# Patient Record
Sex: Female | Born: 1945 | Race: White | Hispanic: No | Smoking: Never smoker
Health system: Southern US, Community
[De-identification: ages and names within clinical notes are randomized; demographics above are authoritative.]

## PROBLEM LIST (undated history)

## (undated) DIAGNOSIS — K635 Polyp of colon: Secondary | ICD-10-CM

## (undated) DIAGNOSIS — M797 Fibromyalgia: Secondary | ICD-10-CM

## (undated) DIAGNOSIS — G4700 Insomnia, unspecified: Secondary | ICD-10-CM

## (undated) DIAGNOSIS — I499 Cardiac arrhythmia, unspecified: Secondary | ICD-10-CM

## (undated) DIAGNOSIS — K5730 Diverticulosis of large intestine without perforation or abscess without bleeding: Secondary | ICD-10-CM

## (undated) DIAGNOSIS — K219 Gastro-esophageal reflux disease without esophagitis: Secondary | ICD-10-CM

## (undated) DIAGNOSIS — M810 Age-related osteoporosis without current pathological fracture: Secondary | ICD-10-CM

## (undated) DIAGNOSIS — H269 Unspecified cataract: Secondary | ICD-10-CM

## (undated) DIAGNOSIS — I10 Essential (primary) hypertension: Secondary | ICD-10-CM

## (undated) DIAGNOSIS — I5189 Other ill-defined heart diseases: Secondary | ICD-10-CM

## (undated) DIAGNOSIS — F419 Anxiety disorder, unspecified: Secondary | ICD-10-CM

## (undated) DIAGNOSIS — K819 Cholecystitis, unspecified: Secondary | ICD-10-CM

## (undated) DIAGNOSIS — E538 Deficiency of other specified B group vitamins: Secondary | ICD-10-CM

## (undated) DIAGNOSIS — R112 Nausea with vomiting, unspecified: Secondary | ICD-10-CM

## (undated) DIAGNOSIS — J329 Chronic sinusitis, unspecified: Secondary | ICD-10-CM

## (undated) DIAGNOSIS — M1990 Unspecified osteoarthritis, unspecified site: Secondary | ICD-10-CM

## (undated) DIAGNOSIS — F3289 Other specified depressive episodes: Secondary | ICD-10-CM

## (undated) DIAGNOSIS — Z9889 Other specified postprocedural states: Secondary | ICD-10-CM

## (undated) DIAGNOSIS — F329 Major depressive disorder, single episode, unspecified: Secondary | ICD-10-CM

## (undated) DIAGNOSIS — C4490 Unspecified malignant neoplasm of skin, unspecified: Secondary | ICD-10-CM

## (undated) DIAGNOSIS — E785 Hyperlipidemia, unspecified: Secondary | ICD-10-CM

## (undated) DIAGNOSIS — E559 Vitamin D deficiency, unspecified: Secondary | ICD-10-CM

## (undated) DIAGNOSIS — R55 Syncope and collapse: Secondary | ICD-10-CM

## (undated) DIAGNOSIS — D649 Anemia, unspecified: Secondary | ICD-10-CM

## (undated) DIAGNOSIS — Z5189 Encounter for other specified aftercare: Secondary | ICD-10-CM

## (undated) HISTORY — PX: MIDDLE EAR SURGERY: SHX713

## (undated) HISTORY — DX: Cholecystitis, unspecified: K81.9

## (undated) HISTORY — DX: Other specified depressive episodes: F32.89

## (undated) HISTORY — DX: Anemia, unspecified: D64.9

## (undated) HISTORY — DX: Syncope and collapse: R55

## (undated) HISTORY — DX: Unspecified cataract: H26.9

## (undated) HISTORY — DX: Encounter for other specified aftercare: Z51.89

## (undated) HISTORY — DX: Fibromyalgia: M79.7

## (undated) HISTORY — PX: TONSILLECTOMY: SUR1361

## (undated) HISTORY — DX: Gastro-esophageal reflux disease without esophagitis: K21.9

## (undated) HISTORY — DX: Diverticulosis of large intestine without perforation or abscess without bleeding: K57.30

## (undated) HISTORY — DX: Morbid (severe) obesity due to excess calories: E66.01

## (undated) HISTORY — DX: Major depressive disorder, single episode, unspecified: F32.9

## (undated) HISTORY — DX: Unspecified malignant neoplasm of skin, unspecified: C44.90

## (undated) HISTORY — DX: Other ill-defined heart diseases: I51.89

## (undated) HISTORY — PX: COLONOSCOPY: SHX174

## (undated) HISTORY — DX: Polyp of colon: K63.5

## (undated) HISTORY — PX: OTHER SURGICAL HISTORY: SHX169

## (undated) HISTORY — DX: Essential (primary) hypertension: I10

## (undated) HISTORY — DX: Vitamin D deficiency, unspecified: E55.9

## (undated) HISTORY — DX: Anxiety disorder, unspecified: F41.9

## (undated) HISTORY — DX: Age-related osteoporosis without current pathological fracture: M81.0

## (undated) HISTORY — DX: Unspecified osteoarthritis, unspecified site: M19.90

## (undated) HISTORY — DX: Insomnia, unspecified: G47.00

## (undated) HISTORY — DX: Hyperlipidemia, unspecified: E78.5

## (undated) HISTORY — PX: APPENDECTOMY: SHX54

## (undated) HISTORY — PX: GASTROPLASTY: SHX192

## (undated) HISTORY — DX: Deficiency of other specified B group vitamins: E53.8

## (undated) HISTORY — PX: CHOLECYSTECTOMY: SHX55

## (undated) HISTORY — PX: TOTAL KNEE ARTHROPLASTY: SHX125

## (undated) HISTORY — PX: ABDOMINAL HYSTERECTOMY: SHX81

## (undated) HISTORY — PX: TUBAL LIGATION: SHX77

---

## 1983-06-19 HISTORY — PX: BREAST EXCISIONAL BIOPSY: SUR124

## 1997-09-27 ENCOUNTER — Other Ambulatory Visit: Admission: RE | Admit: 1997-09-27 | Discharge: 1997-09-27 | Payer: Self-pay | Admitting: Gynecology

## 1997-10-08 ENCOUNTER — Other Ambulatory Visit: Admission: RE | Admit: 1997-10-08 | Discharge: 1997-10-08 | Payer: Self-pay | Admitting: Gynecology

## 1998-07-05 ENCOUNTER — Encounter: Payer: Self-pay | Admitting: Internal Medicine

## 1998-07-05 ENCOUNTER — Ambulatory Visit (HOSPITAL_COMMUNITY): Admission: RE | Admit: 1998-07-05 | Discharge: 1998-07-05 | Payer: Self-pay | Admitting: Internal Medicine

## 1999-02-13 ENCOUNTER — Other Ambulatory Visit: Admission: RE | Admit: 1999-02-13 | Discharge: 1999-02-13 | Payer: Self-pay | Admitting: Internal Medicine

## 1999-12-07 ENCOUNTER — Other Ambulatory Visit: Admission: RE | Admit: 1999-12-07 | Discharge: 1999-12-07 | Payer: Self-pay | Admitting: Gynecology

## 2000-01-08 ENCOUNTER — Ambulatory Visit (HOSPITAL_COMMUNITY): Admission: RE | Admit: 2000-01-08 | Discharge: 2000-01-08 | Payer: Self-pay | Admitting: Internal Medicine

## 2000-01-08 ENCOUNTER — Encounter: Payer: Self-pay | Admitting: Internal Medicine

## 2000-02-08 ENCOUNTER — Encounter: Payer: Self-pay | Admitting: Gynecology

## 2000-02-08 ENCOUNTER — Encounter: Admission: RE | Admit: 2000-02-08 | Discharge: 2000-02-08 | Payer: Self-pay | Admitting: Gynecology

## 2000-02-12 ENCOUNTER — Observation Stay (HOSPITAL_COMMUNITY): Admission: EM | Admit: 2000-02-12 | Discharge: 2000-02-13 | Payer: Self-pay | Admitting: Internal Medicine

## 2000-02-12 ENCOUNTER — Encounter: Payer: Self-pay | Admitting: Internal Medicine

## 2000-02-13 ENCOUNTER — Encounter: Payer: Self-pay | Admitting: Internal Medicine

## 2000-02-14 ENCOUNTER — Encounter: Admission: RE | Admit: 2000-02-14 | Discharge: 2000-02-14 | Payer: Self-pay | Admitting: Gynecology

## 2000-02-14 ENCOUNTER — Encounter: Payer: Self-pay | Admitting: Gynecology

## 2000-03-28 ENCOUNTER — Ambulatory Visit (HOSPITAL_COMMUNITY): Admission: RE | Admit: 2000-03-28 | Discharge: 2000-03-28 | Payer: Self-pay | Admitting: Internal Medicine

## 2000-04-25 ENCOUNTER — Encounter: Payer: Self-pay | Admitting: Surgery

## 2000-04-25 ENCOUNTER — Inpatient Hospital Stay (HOSPITAL_COMMUNITY): Admission: RE | Admit: 2000-04-25 | Discharge: 2000-04-29 | Payer: Self-pay | Admitting: Surgery

## 2000-07-09 ENCOUNTER — Ambulatory Visit (HOSPITAL_COMMUNITY): Admission: RE | Admit: 2000-07-09 | Discharge: 2000-07-09 | Payer: Self-pay | Admitting: Surgery

## 2000-07-09 ENCOUNTER — Encounter: Payer: Self-pay | Admitting: Surgery

## 2000-12-09 ENCOUNTER — Other Ambulatory Visit: Admission: RE | Admit: 2000-12-09 | Discharge: 2000-12-09 | Payer: Self-pay | Admitting: Gynecology

## 2001-02-14 ENCOUNTER — Encounter: Payer: Self-pay | Admitting: Gynecology

## 2001-02-14 ENCOUNTER — Encounter: Admission: RE | Admit: 2001-02-14 | Discharge: 2001-02-14 | Payer: Self-pay | Admitting: Gynecology

## 2001-11-21 ENCOUNTER — Ambulatory Visit (HOSPITAL_COMMUNITY): Admission: RE | Admit: 2001-11-21 | Discharge: 2001-11-21 | Payer: Self-pay | Admitting: Internal Medicine

## 2001-11-21 ENCOUNTER — Encounter: Payer: Self-pay | Admitting: Internal Medicine

## 2001-12-11 ENCOUNTER — Other Ambulatory Visit: Admission: RE | Admit: 2001-12-11 | Discharge: 2001-12-11 | Payer: Self-pay | Admitting: Gynecology

## 2002-02-17 ENCOUNTER — Encounter: Payer: Self-pay | Admitting: Gynecology

## 2002-02-17 ENCOUNTER — Encounter: Admission: RE | Admit: 2002-02-17 | Discharge: 2002-02-17 | Payer: Self-pay | Admitting: Gynecology

## 2002-12-14 ENCOUNTER — Other Ambulatory Visit: Admission: RE | Admit: 2002-12-14 | Discharge: 2002-12-14 | Payer: Self-pay | Admitting: Gynecology

## 2003-03-02 ENCOUNTER — Encounter: Admission: RE | Admit: 2003-03-02 | Discharge: 2003-03-02 | Payer: Self-pay | Admitting: Gynecology

## 2003-03-02 ENCOUNTER — Encounter: Payer: Self-pay | Admitting: Gynecology

## 2003-03-08 ENCOUNTER — Encounter: Admission: RE | Admit: 2003-03-08 | Discharge: 2003-03-08 | Payer: Self-pay | Admitting: Gynecology

## 2003-03-08 ENCOUNTER — Encounter: Payer: Self-pay | Admitting: Gynecology

## 2003-12-16 ENCOUNTER — Other Ambulatory Visit: Admission: RE | Admit: 2003-12-16 | Discharge: 2003-12-16 | Payer: Self-pay | Admitting: Gynecology

## 2004-03-09 ENCOUNTER — Encounter: Admission: RE | Admit: 2004-03-09 | Discharge: 2004-03-09 | Payer: Self-pay | Admitting: Gynecology

## 2004-05-17 ENCOUNTER — Ambulatory Visit: Payer: Self-pay | Admitting: Internal Medicine

## 2004-08-16 ENCOUNTER — Ambulatory Visit: Payer: Self-pay | Admitting: Internal Medicine

## 2004-12-26 ENCOUNTER — Other Ambulatory Visit: Admission: RE | Admit: 2004-12-26 | Discharge: 2004-12-26 | Payer: Self-pay | Admitting: Gynecology

## 2005-03-30 ENCOUNTER — Encounter: Admission: RE | Admit: 2005-03-30 | Discharge: 2005-03-30 | Payer: Self-pay | Admitting: Gynecology

## 2005-04-03 ENCOUNTER — Ambulatory Visit: Payer: Self-pay | Admitting: Internal Medicine

## 2005-04-18 ENCOUNTER — Ambulatory Visit: Payer: Self-pay | Admitting: Internal Medicine

## 2006-01-07 ENCOUNTER — Other Ambulatory Visit: Admission: RE | Admit: 2006-01-07 | Discharge: 2006-01-07 | Payer: Self-pay | Admitting: Gynecology

## 2006-01-22 ENCOUNTER — Ambulatory Visit: Payer: Self-pay | Admitting: Internal Medicine

## 2006-01-31 ENCOUNTER — Ambulatory Visit: Payer: Self-pay

## 2006-02-04 ENCOUNTER — Inpatient Hospital Stay (HOSPITAL_COMMUNITY): Admission: RE | Admit: 2006-02-04 | Discharge: 2006-02-07 | Payer: Self-pay | Admitting: Orthopedic Surgery

## 2006-02-11 ENCOUNTER — Ambulatory Visit: Payer: Self-pay | Admitting: Internal Medicine

## 2006-02-12 ENCOUNTER — Encounter: Payer: Self-pay | Admitting: Vascular Surgery

## 2006-02-12 ENCOUNTER — Ambulatory Visit (HOSPITAL_COMMUNITY): Admission: RE | Admit: 2006-02-12 | Discharge: 2006-02-12 | Payer: Self-pay | Admitting: Internal Medicine

## 2006-02-12 ENCOUNTER — Ambulatory Visit: Payer: Self-pay | Admitting: Internal Medicine

## 2006-04-03 ENCOUNTER — Encounter: Admission: RE | Admit: 2006-04-03 | Discharge: 2006-04-03 | Payer: Self-pay | Admitting: Gynecology

## 2006-04-03 ENCOUNTER — Ambulatory Visit: Payer: Self-pay | Admitting: Internal Medicine

## 2006-07-01 ENCOUNTER — Ambulatory Visit: Payer: Self-pay | Admitting: Internal Medicine

## 2006-07-24 ENCOUNTER — Encounter (INDEPENDENT_AMBULATORY_CARE_PROVIDER_SITE_OTHER): Payer: Self-pay | Admitting: *Deleted

## 2006-07-24 ENCOUNTER — Ambulatory Visit: Payer: Self-pay | Admitting: Internal Medicine

## 2006-07-24 LAB — HM COLONOSCOPY

## 2006-08-20 ENCOUNTER — Ambulatory Visit: Payer: Self-pay | Admitting: Internal Medicine

## 2006-08-21 ENCOUNTER — Ambulatory Visit: Payer: Self-pay | Admitting: *Deleted

## 2006-09-16 ENCOUNTER — Ambulatory Visit: Payer: Self-pay | Admitting: Internal Medicine

## 2006-10-07 ENCOUNTER — Ambulatory Visit: Payer: Self-pay | Admitting: Cardiology

## 2007-01-16 ENCOUNTER — Other Ambulatory Visit: Admission: RE | Admit: 2007-01-16 | Discharge: 2007-01-16 | Payer: Self-pay | Admitting: Gynecology

## 2007-01-20 ENCOUNTER — Ambulatory Visit: Payer: Self-pay | Admitting: Internal Medicine

## 2007-01-20 LAB — CONVERTED CEMR LAB
ALT: 12 units/L (ref 0–35)
AST: 12 units/L (ref 0–37)
Alkaline Phosphatase: 84 units/L (ref 39–117)
BUN: 12 mg/dL (ref 6–23)
Basophils Relative: 0.6 % (ref 0.0–1.0)
Bilirubin Urine: NEGATIVE
Bilirubin, Direct: 0.1 mg/dL (ref 0.0–0.3)
Calcium: 9 mg/dL (ref 8.4–10.5)
Chloride: 105 meq/L (ref 96–112)
Eosinophils Absolute: 0.2 10*3/uL (ref 0.0–0.6)
Eosinophils Relative: 2.6 % (ref 0.0–5.0)
GFR calc Af Amer: 109 mL/min
GFR calc non Af Amer: 90 mL/min
Glucose, Bld: 94 mg/dL (ref 70–99)
HDL: 40.6 mg/dL (ref >39.0)
Ketones, ur: NEGATIVE mg/dL
MCV: 87.8 fL (ref 78.0–100.0)
Monocytes Relative: 5.3 % (ref 3.0–11.0)
Neutro Abs: 3.6 10*3/uL (ref 1.4–7.7)
Nitrite: NEGATIVE
Platelets: 264 10*3/uL (ref 150–400)
RBC: 4.4 M/uL (ref 3.87–5.11)
Specific Gravity, Urine: 1.01 (ref 1.000–1.03)
TSH: 1.03 microintl units/mL (ref 0.35–5.50)
Total CHOL/HDL Ratio: 5.8
Total Protein, Urine: NEGATIVE mg/dL
Triglycerides: 182 mg/dL — ABNORMAL HIGH (ref 0–149)
Urine Glucose: NEGATIVE mg/dL
WBC: 6.6 10*3/uL (ref 4.5–10.5)

## 2007-03-17 ENCOUNTER — Ambulatory Visit: Payer: Self-pay | Admitting: Internal Medicine

## 2007-03-17 ENCOUNTER — Inpatient Hospital Stay (HOSPITAL_COMMUNITY): Admission: RE | Admit: 2007-03-17 | Discharge: 2007-03-22 | Payer: Self-pay | Admitting: Orthopedic Surgery

## 2007-03-19 ENCOUNTER — Ambulatory Visit: Payer: Self-pay | Admitting: Vascular Surgery

## 2007-04-01 ENCOUNTER — Ambulatory Visit: Payer: Self-pay | Admitting: Internal Medicine

## 2007-04-01 LAB — CONVERTED CEMR LAB
BUN: 8 mg/dL (ref 6–23)
Basophils Relative: 0.2 % (ref 0.0–1.0)
CO2: 31 meq/L (ref 19–32)
Creatinine, Ser: 0.6 mg/dL (ref 0.4–1.2)
Eosinophils Relative: 2.5 % (ref 0.0–5.0)
Glucose, Bld: 104 mg/dL — ABNORMAL HIGH (ref 70–99)
HCT: 40.2 % (ref 36.0–46.0)
Hemoglobin: 13.6 g/dL (ref 12.0–15.0)
Leukocytes, UA: NEGATIVE
Lymphocytes Relative: 20.8 % (ref 12.0–46.0)
Monocytes Absolute: 0.5 10*3/uL (ref 0.2–0.7)
Monocytes Relative: 4.6 % (ref 3.0–11.0)
Neutro Abs: 7.6 10*3/uL (ref 1.4–7.7)
Nitrite: NEGATIVE
Potassium: 3.9 meq/L (ref 3.5–5.1)
RDW: 14.7 % — ABNORMAL HIGH (ref 11.5–14.6)
Specific Gravity, Urine: 1.02 (ref 1.000–1.03)
Total Protein, Urine: NEGATIVE mg/dL
Urobilinogen, UA: 0.2 (ref 0.0–1.0)
WBC: 10.6 10*3/uL — ABNORMAL HIGH (ref 4.5–10.5)
pH: 5.5 (ref 5.0–8.0)

## 2007-04-04 ENCOUNTER — Encounter: Payer: Self-pay | Admitting: Internal Medicine

## 2007-04-04 DIAGNOSIS — K219 Gastro-esophageal reflux disease without esophagitis: Secondary | ICD-10-CM

## 2007-04-04 DIAGNOSIS — K5730 Diverticulosis of large intestine without perforation or abscess without bleeding: Secondary | ICD-10-CM | POA: Insufficient documentation

## 2007-04-07 ENCOUNTER — Ambulatory Visit: Payer: Self-pay | Admitting: Internal Medicine

## 2007-04-07 ENCOUNTER — Encounter: Admission: RE | Admit: 2007-04-07 | Discharge: 2007-04-07 | Payer: Self-pay | Admitting: Gynecology

## 2007-04-07 DIAGNOSIS — D500 Iron deficiency anemia secondary to blood loss (chronic): Secondary | ICD-10-CM

## 2007-04-07 DIAGNOSIS — M1990 Unspecified osteoarthritis, unspecified site: Secondary | ICD-10-CM | POA: Insufficient documentation

## 2007-04-07 DIAGNOSIS — R0989 Other specified symptoms and signs involving the circulatory and respiratory systems: Secondary | ICD-10-CM

## 2007-04-07 DIAGNOSIS — R0609 Other forms of dyspnea: Secondary | ICD-10-CM

## 2007-04-07 DIAGNOSIS — E538 Deficiency of other specified B group vitamins: Secondary | ICD-10-CM

## 2007-04-08 ENCOUNTER — Encounter: Payer: Self-pay | Admitting: Internal Medicine

## 2007-04-28 ENCOUNTER — Telehealth (INDEPENDENT_AMBULATORY_CARE_PROVIDER_SITE_OTHER): Payer: Self-pay | Admitting: *Deleted

## 2007-07-08 ENCOUNTER — Ambulatory Visit: Payer: Self-pay | Admitting: Internal Medicine

## 2007-07-08 DIAGNOSIS — G4700 Insomnia, unspecified: Secondary | ICD-10-CM

## 2007-07-08 DIAGNOSIS — F4323 Adjustment disorder with mixed anxiety and depressed mood: Secondary | ICD-10-CM

## 2007-07-08 DIAGNOSIS — J310 Chronic rhinitis: Secondary | ICD-10-CM

## 2007-07-18 ENCOUNTER — Encounter: Payer: Self-pay | Admitting: Internal Medicine

## 2007-09-23 ENCOUNTER — Ambulatory Visit: Payer: Self-pay | Admitting: Internal Medicine

## 2007-10-07 ENCOUNTER — Ambulatory Visit: Payer: Self-pay | Admitting: Internal Medicine

## 2007-10-07 DIAGNOSIS — R300 Dysuria: Secondary | ICD-10-CM | POA: Insufficient documentation

## 2007-10-07 DIAGNOSIS — F411 Generalized anxiety disorder: Secondary | ICD-10-CM

## 2007-10-08 LAB — CONVERTED CEMR LAB
Bilirubin Urine: NEGATIVE
Nitrite: NEGATIVE
Total Protein, Urine: NEGATIVE mg/dL
Urine Glucose: NEGATIVE mg/dL
pH: 5.5 (ref 5.0–8.0)

## 2008-02-02 LAB — HM DIABETES EYE EXAM

## 2008-02-20 ENCOUNTER — Encounter: Payer: Self-pay | Admitting: Internal Medicine

## 2008-03-02 ENCOUNTER — Encounter: Payer: Self-pay | Admitting: Internal Medicine

## 2008-03-23 ENCOUNTER — Ambulatory Visit: Payer: Self-pay | Admitting: Internal Medicine

## 2008-03-29 ENCOUNTER — Encounter: Payer: Self-pay | Admitting: Internal Medicine

## 2008-04-08 ENCOUNTER — Encounter: Admission: RE | Admit: 2008-04-08 | Discharge: 2008-04-08 | Payer: Self-pay | Admitting: Gynecology

## 2008-04-09 ENCOUNTER — Ambulatory Visit: Payer: Self-pay | Admitting: Internal Medicine

## 2008-04-09 DIAGNOSIS — J018 Other acute sinusitis: Secondary | ICD-10-CM

## 2008-04-09 DIAGNOSIS — R05 Cough: Secondary | ICD-10-CM | POA: Insufficient documentation

## 2008-04-09 DIAGNOSIS — H6990 Unspecified Eustachian tube disorder, unspecified ear: Secondary | ICD-10-CM | POA: Insufficient documentation

## 2008-05-19 ENCOUNTER — Encounter: Payer: Self-pay | Admitting: Internal Medicine

## 2008-06-18 HISTORY — PX: BREAST BIOPSY: SHX20

## 2008-09-27 ENCOUNTER — Ambulatory Visit: Payer: Self-pay | Admitting: Internal Medicine

## 2008-09-27 DIAGNOSIS — R55 Syncope and collapse: Secondary | ICD-10-CM

## 2008-09-28 ENCOUNTER — Encounter (INDEPENDENT_AMBULATORY_CARE_PROVIDER_SITE_OTHER): Payer: Self-pay | Admitting: *Deleted

## 2008-09-28 ENCOUNTER — Ambulatory Visit: Payer: Self-pay | Admitting: Internal Medicine

## 2008-10-09 DIAGNOSIS — E559 Vitamin D deficiency, unspecified: Secondary | ICD-10-CM

## 2008-10-09 DIAGNOSIS — I10 Essential (primary) hypertension: Secondary | ICD-10-CM | POA: Insufficient documentation

## 2008-10-09 DIAGNOSIS — E785 Hyperlipidemia, unspecified: Secondary | ICD-10-CM | POA: Insufficient documentation

## 2008-10-18 ENCOUNTER — Encounter: Payer: Self-pay | Admitting: Internal Medicine

## 2008-10-18 ENCOUNTER — Ambulatory Visit: Payer: Self-pay

## 2008-10-18 ENCOUNTER — Ambulatory Visit: Payer: Self-pay | Admitting: Cardiology

## 2008-10-18 DIAGNOSIS — R002 Palpitations: Secondary | ICD-10-CM

## 2008-10-18 DIAGNOSIS — R079 Chest pain, unspecified: Secondary | ICD-10-CM | POA: Insufficient documentation

## 2008-10-25 ENCOUNTER — Telehealth: Payer: Self-pay | Admitting: Internal Medicine

## 2008-11-03 ENCOUNTER — Ambulatory Visit: Payer: Self-pay

## 2008-11-03 ENCOUNTER — Ambulatory Visit: Payer: Self-pay | Admitting: Cardiology

## 2008-11-03 ENCOUNTER — Encounter: Payer: Self-pay | Admitting: Cardiology

## 2008-11-19 ENCOUNTER — Ambulatory Visit: Payer: Self-pay | Admitting: Cardiology

## 2008-11-25 ENCOUNTER — Emergency Department (HOSPITAL_COMMUNITY): Admission: EM | Admit: 2008-11-25 | Discharge: 2008-11-25 | Payer: Self-pay | Admitting: Emergency Medicine

## 2008-12-09 ENCOUNTER — Telehealth (INDEPENDENT_AMBULATORY_CARE_PROVIDER_SITE_OTHER): Payer: Self-pay | Admitting: *Deleted

## 2009-01-12 ENCOUNTER — Telehealth: Payer: Self-pay | Admitting: Internal Medicine

## 2009-03-02 ENCOUNTER — Ambulatory Visit: Payer: Self-pay | Admitting: Internal Medicine

## 2009-03-03 ENCOUNTER — Encounter: Payer: Self-pay | Admitting: Internal Medicine

## 2009-04-18 ENCOUNTER — Encounter: Admission: RE | Admit: 2009-04-18 | Discharge: 2009-04-18 | Payer: Self-pay | Admitting: Gynecology

## 2009-04-19 ENCOUNTER — Telehealth: Payer: Self-pay | Admitting: Internal Medicine

## 2009-04-22 ENCOUNTER — Encounter: Payer: Self-pay | Admitting: Internal Medicine

## 2009-04-25 ENCOUNTER — Encounter: Admission: RE | Admit: 2009-04-25 | Discharge: 2009-04-25 | Payer: Self-pay | Admitting: Gynecology

## 2009-04-27 ENCOUNTER — Encounter: Admission: RE | Admit: 2009-04-27 | Discharge: 2009-04-27 | Payer: Self-pay | Admitting: Gynecology

## 2009-04-27 ENCOUNTER — Encounter (INDEPENDENT_AMBULATORY_CARE_PROVIDER_SITE_OTHER): Payer: Self-pay | Admitting: Diagnostic Radiology

## 2009-05-18 ENCOUNTER — Ambulatory Visit: Payer: Self-pay | Admitting: Internal Medicine

## 2009-09-06 ENCOUNTER — Telehealth: Payer: Self-pay | Admitting: Internal Medicine

## 2009-09-09 ENCOUNTER — Telehealth: Payer: Self-pay | Admitting: Internal Medicine

## 2009-09-15 ENCOUNTER — Encounter: Payer: Self-pay | Admitting: Internal Medicine

## 2009-10-14 ENCOUNTER — Ambulatory Visit: Payer: Self-pay | Admitting: Internal Medicine

## 2009-10-18 ENCOUNTER — Ambulatory Visit: Payer: Self-pay | Admitting: Internal Medicine

## 2009-11-17 ENCOUNTER — Encounter: Payer: Self-pay | Admitting: Internal Medicine

## 2010-03-01 ENCOUNTER — Ambulatory Visit: Payer: Self-pay | Admitting: Internal Medicine

## 2010-04-19 ENCOUNTER — Encounter: Admission: RE | Admit: 2010-04-19 | Discharge: 2010-04-19 | Payer: Self-pay | Admitting: Gynecology

## 2010-05-04 ENCOUNTER — Encounter: Admission: RE | Admit: 2010-05-04 | Discharge: 2010-05-04 | Payer: Self-pay | Admitting: Gynecology

## 2010-05-25 ENCOUNTER — Encounter: Payer: Self-pay | Admitting: Internal Medicine

## 2010-07-16 LAB — CONVERTED CEMR LAB
ALT: 11 units/L (ref 0–35)
AST: 13 units/L (ref 0–37)
Albumin: 4 g/dL (ref 3.5–5.2)
Basophils Relative: 0.3 % (ref 0.0–3.0)
Bilirubin Urine: NEGATIVE
CO2: 32 meq/L (ref 19–32)
Chloride: 104 meq/L (ref 96–112)
Eosinophils Relative: 2 % (ref 0.0–5.0)
HCT: 40.3 % (ref 36.0–46.0)
Hemoglobin, Urine: NEGATIVE
Hemoglobin: 13.7 g/dL (ref 12.0–15.0)
Ketones, ur: NEGATIVE mg/dL
Leukocytes, UA: NEGATIVE
Lymphs Abs: 2 10*3/uL (ref 0.7–4.0)
MCV: 90.4 fL (ref 78.0–100.0)
Monocytes Relative: 6.2 % (ref 3.0–12.0)
Neutro Abs: 4.1 10*3/uL (ref 1.4–7.7)
Potassium: 4.2 meq/L (ref 3.5–5.1)
Pro B Natriuretic peptide (BNP): 25 pg/mL (ref 0.0–100.0)
TSH: 1.47 microintl units/mL (ref 0.35–5.50)
Total Protein: 6.8 g/dL (ref 6.0–8.3)
Vitamin B-12: >1500 pg/mL — ABNORMAL HIGH (ref 211–911)
WBC: 6.6 10*3/uL (ref 4.5–10.5)
pH: 5.5 (ref 5.0–8.0)

## 2010-07-18 NOTE — Miscellaneous (Signed)
Summary: Clarification for Alprazolam/[facility]  Clarification for Alprazolam/[facility]   Imported By: Sherian Rein 09/19/2009 15:04:27  _____________________________________________________________________  External Attachment:    Type:   Image     Comment:   External Document

## 2010-07-18 NOTE — Letter (Signed)
Summary: [facility]  [facility]   Imported By: Sherian Rein 12/21/2009 11:33:43  _____________________________________________________________________  External Attachment:    Type:   Image     Comment:   External Document

## 2010-07-18 NOTE — Progress Notes (Signed)
Summary: [facility] ?  Phone Note From Pharmacy   Caller: [facility] - [phone] REF # [phone] Summary of Call: [facility] is req a call back regarding Verapamil.  Initial call taken by: Lamar Sprinkles, CMA,  September 09, 2009 6:06 PM  Follow-up for Phone Call        [facility] needs to verif if MD wants the patient to have Verapamil CR or ER and caps or tabs please advise. Follow-up by: Lucious Groves,  September 14, 2009 9:03 AM  Additional Follow-up for Phone Call Additional follow up Details #1::        ER tabs Additional Follow-up by: Tresa Garter MD,  September 14, 2009 2:25 PM    Additional Follow-up for Phone Call Additional follow up Details #2::    [facility] notified. Follow-up by: Lucious Groves,  September 14, 2009 2:27 PM  New/Updated Medications: VERAPAMIL HCL CR 240 MG CR-TABS (VERAPAMIL HCL) 1 by mouth qd

## 2010-07-18 NOTE — Assessment & Plan Note (Signed)
Summary: flu shot/nws   Nurse Visit   Allergies: No Known Drug Allergies  Orders Added: 1)  Admin 1st Vaccine [90471] 2)  Flu Vaccine 5yrs + [16109]      Flu Vaccine Consent Questions     Do you have a history of severe allergic reactions to this vaccine? no    Any prior history of allergic reactions to egg and/or gelatin? no    Do you have a sensitivity to the preservative Thimersol? no    Do you have a past history of Guillan-Barre Syndrome? no    Do you currently have an acute febrile illness? no    Have you ever had a severe reaction to latex? no    Vaccine information given and explained to patient? yes    Are you currently pregnant? no    Lot Number:[id number]   Exp Date:12/16/2010   Site Given  Left Deltoid IM

## 2010-07-18 NOTE — Assessment & Plan Note (Signed)
Summary: tb test/plot/cd   Nurse Visit   Allergies: No Known Drug Allergies  Immunizations Administered:  PPD Skin Test:    Vaccine Type: PPD    Site: left forearm    Dose: 0.1 ml    Route: ID    Given by: Lamar Sprinkles, CMA    Exp. Date: 10/12/2011    Lot #: [id number]  PPD Results    Date of reading: 10/20/2009    Results: < 5mm    Interpretation: negative  Orders Added: 1)  TB Skin Test [86580] 2)  Admin 1st Vaccine [45409]

## 2010-07-18 NOTE — Assessment & Plan Note (Signed)
Summary: FU/ HEALTH FORM TO FILL OUT/ TB INJ /NWS   Vital Signs:  Patient profile:   65 year old female Height:      60 inches Weight:      193 pounds BMI:     37.83 O2 Sat:      95 % on Room air Temp:     98.0 degrees F oral Pulse rate:   67 / minute BP sitting:   126 / 84  (left arm) Cuff size:   regular  Vitals Entered By: Bill Salinas CMA (October 14, 2009 7:56 AM)  O2 Flow:  Room air CC: pt here to have health form filled out. She is due for tetanus. She will get tetanus and TB today/ pt gets yearly mammograms and see Dr Nicholas Lose for pap smears/ ab   CC:  pt here to have health form filled out. She is due for tetanus. She will get tetanus and TB today/ pt gets yearly mammograms and see Dr Nicholas Lose for pap smears/ ab.  History of Present Illness: Needs a health form F/u HTN, depression   Current Medications (verified): 1)  B-12 Microlozenge 500 Mcg  Subl (Cyanocobalamin) .Marland Kitchen.. 1 Tab By Mouth Once Daily 2)  Adult Aspirin Low Strength 81 Mg  Tbdp (Aspirin) .... Take 1 Tablet By Mouth Once A Day 3)  Verapamil Hcl Cr 240 Mg Cr-Tabs (Verapamil Hcl) .Marland Kitchen.. 1 By Mouth Qd 4)  Lovastatin 40 Mg  Tabs (Lovastatin) .... Take 1 Tablet By Mouth Once A Day 5)  Xanax 0.5 Mg  Tabs (Alprazolam) .... Take 1 Tablet By Mouth Two Times A Day As Needed 6)  Wellbutrin Sr 150 Mg Tb12 (Bupropion Hcl) .Marland Kitchen.. 1 Bid 7)  Zolpidem Tartrate 10 Mg  Tabs (Zolpidem Tartrate) .Marland Kitchen.. 1 By Mouth Once Daily 8)  Vitamin D3 1000 Unit  Tabs (Cholecalciferol) .Marland Kitchen.. 1 Qd 9)  Flonase 50 Mcg/act Susp (Fluticasone Propionate) .... 2 Sprays Each Day As Needed 10)  Fiorinal 50-325-40 Mg  Caps (Butalbital-Aspirin-Caffeine) .Marland Kitchen.. 1 By Mouth Qid As Needed Headache 11)  Protonix 40 Mg  Tbec (Pantoprazole Sodium) .Marland Kitchen.. 1 Each Day 30 Minutes Before Meal 12)  Lexapro 10 Mg Tabs (Escitalopram Oxalate) .... 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  No DM No hypertension Hyperlipidemia Gerd fibromyalgia Anxiety/depression  Past Surgical History: Last updated: 10/09/2008 colonoscopy  99 H/o gastric stapling egd  03 colon 03 right total knee arthroplasty 8/7; L 10/8 gastroplasty with subsequent takedown.  Tonsillectomy at age 16.  Appendectomy at age 46.  Cesarean section 1967. Cesarean section 1969.  Stomach stapling 1980  Hysterectomy 1993.  Reversal of stomach stapling 1998.  Cholecystectomy 2002. Tubal ligation 1985.  Social History: Last updated: 10/18/2008 Occupation: office Never Smoked Alcohol use-no Hasband sick with CAD Married   Review of Systems  The patient denies fever, dyspnea on exertion, and abdominal pain.    Physical Exam  General:  Well-developed obese in no acute distress. Her skin is warm and dry. Nose:  nasal dischargemucosal pallor.   Mouth:  no exudates, no pharyngeal crowing, no lesions, no tongue abnormalities, pharyngeal erythema, tonsils absent, posterior lymphoid hypertrophy, and postnasal drip.   Lungs:  clear to auscultation Heart:  regular rate and rhythm Abdomen:  soft and not tender. No  masses. Msk:  No deformity or scoliosis noted of thoracic or lumbar spine.   Neurologic:  grossly intact Skin:  turgor normal, color normal, no rashes, and no suspicious lesions.   Psych:  Oriented X3 and depressed affect.     Impression & Recommendations:  Problem # 1:  HYPERTENSION (ICD-401.9) Assessment Improved  Her updated medication list for this problem includes:    Verapamil Hcl Cr 240 Mg Cr-tabs (Verapamil hcl) .Marland Kitchen... 1 by mouth qd  Problem # 2:  ANXIETY (ICD-300.00) Assessment: Improved  Her updated medication list for this problem includes:    Xanax 0.5 Mg Tabs (Alprazolam) .Marland Kitchen... Take 1 tablet by mouth two times a day as needed     Wellbutrin Sr 150 Mg Tb12 (Bupropion hcl) .Marland Kitchen... 1 bid    Lexapro 10 Mg Tabs (Escitalopram oxalate) .Marland Kitchen... Take 1 tab by mouth daily at hs  Complete Medication List: 1)  B-12 Microlozenge 500 Mcg Subl (Cyanocobalamin) .Marland Kitchen.. 1 tab by mouth once daily 2)  Adult Aspirin Low Strength 81 Mg Tbdp (Aspirin) .... Take 1 tablet by mouth once a day 3)  Verapamil Hcl Cr 240 Mg Cr-tabs (Verapamil hcl) .Marland Kitchen.. 1 by mouth qd 4)  Lovastatin 40 Mg Tabs (Lovastatin) .... Take 1 tablet by mouth once a day 5)  Xanax 0.5 Mg Tabs (Alprazolam) .... Take 1 tablet by mouth two times a day as needed 6)  Wellbutrin Sr 150 Mg Tb12 (Bupropion hcl) .Marland Kitchen.. 1 bid 7)  Zolpidem Tartrate 10 Mg Tabs (Zolpidem tartrate) .Marland Kitchen.. 1 by mouth once daily 8)  Vitamin D3 1000 Unit Tabs (Cholecalciferol) .Marland Kitchen.. 1 qd 9)  Flonase 50 Mcg/act Susp (Fluticasone propionate) .... 2 sprays each day as needed 10)  Fiorinal 50-325-40 Mg Caps (Butalbital-aspirin-caffeine) .Marland Kitchen.. 1 by mouth qid as needed headache 11)  Protonix 40 Mg Tbec (Pantoprazole sodium) .Marland Kitchen.. 1 each day 30 minutes before meal 12)  Lexapro 10 Mg Tabs (Escitalopram oxalate) .... Take 1 tab by mouth daily at hs  Other Orders: TD Toxoids IM 7 YR + (45409) Admin 1st Vaccine (81191)  Patient Instructions: 1)  Please schedule a follow-up appointment in 6 months well w/labs.   Immunization History:  Influenza Immunization History:    Influenza:  historical (03/23/2009)  Immunizations Administered:  Tetanus Vaccine:    Vaccine Type: Td    Site: left deltoid    Mfr: Sanofi Pasteur    Dose: 0.5 ml    Route: IM    Given by: Ami Bullins CMA    Exp. Date: 07/01/2011    Lot #: [id number]    VIS given: 05/06/07 version given October 14, 2009.

## 2010-07-18 NOTE — Progress Notes (Signed)
Summary: lexapro,lovastatin,bupropion,verapamil  Phone Note Other Incoming Call back at fax   Caller: [facility] Summary of Call: refill on lexapro, lovastatin, buropion, verapamil-----ok x 3 refills will fax back to pharmacy/vg Initial call taken by: Tora Perches,  September 06, 2009 4:47 PM    Prescriptions: LEXAPRO 10 MG TABS (ESCITALOPRAM OXALATE) Take 1 tab by mouth daily at hs  #90 x 3   Entered by:   Tora Perches   Authorized by:   Tresa Garter MD   Signed by:   Tora Perches on 09/06/2009   Method used:   Faxed to ...       [facility]* (mail-order)             ,          Ph: [phone]       Fax: [phone]   RxID:   [id number] WELLBUTRIN SR 150 MG TB12 (BUPROPION HCL) 1 bid  #90 x 3   Entered by:   Tora Perches   Authorized by:   Tresa Garter MD   Signed by:   Tora Perches on 09/06/2009   Method used:   Faxed to ...       [facility]* (mail-order)             ,          Ph: [phone]       Fax: [phone]   RxID:   [id number] LOVASTATIN 40 MG  TABS (LOVASTATIN) Take 1 tablet by mouth once a day  #90 x 2   Entered by:   Tora Perches   Authorized by:   Tresa Garter MD   Signed by:   Tora Perches on 09/06/2009   Method used:   Faxed to ...       [facility]* (mail-order)             ,          Ph: [phone]       Fax: [phone]   RxID:   [id number] VERAPAMIL HCL CR 240 MG  CP24 (VERAPAMIL HCL) Take 1 tablet by mouth once a day  #90 x 3   Entered by:   Tora Perches   Authorized by:   Tresa Garter MD   Signed by:   Tora Perches on 09/06/2009   Method used:   Faxed to ...       [facility]* (mail-order)             ,          Ph: [phone]       Fax: [phone]   RxID:   [id number]

## 2010-07-18 NOTE — Medication Information (Signed)
Summary: Clarification for Butalb/[facility]  Clarification for Butalb/[facility]   Imported By: Sherian Rein 09/19/2009 15:06:17  _____________________________________________________________________  External Attachment:    Type:   Image     Comment:   External Document

## 2010-07-20 NOTE — Letter (Signed)
[facility]: [facility]  [facility]   Imported By: Sherian Rein 06/16/2010 09:12:05  _____________________________________________________________________  External Attachment:    Type:   Image     Comment:   External Document

## 2010-07-21 ENCOUNTER — Other Ambulatory Visit: Payer: Self-pay

## 2010-07-21 ENCOUNTER — Ambulatory Visit: Admit: 2010-07-21 | Payer: Self-pay | Admitting: Internal Medicine

## 2010-07-25 ENCOUNTER — Other Ambulatory Visit: Payer: Self-pay | Admitting: Internal Medicine

## 2010-07-25 ENCOUNTER — Other Ambulatory Visit: Payer: Self-pay

## 2010-07-25 ENCOUNTER — Encounter (INDEPENDENT_AMBULATORY_CARE_PROVIDER_SITE_OTHER): Payer: Self-pay | Admitting: *Deleted

## 2010-07-25 DIAGNOSIS — Z0000 Encounter for general adult medical examination without abnormal findings: Secondary | ICD-10-CM

## 2010-07-25 LAB — HEPATIC FUNCTION PANEL
ALT: 14 U/L (ref 0–35)
AST: 14 U/L (ref 0–37)
Albumin: 3.5 g/dL (ref 3.5–5.2)
Alkaline Phosphatase: 63 U/L (ref 39–117)
Total Protein: 6.1 g/dL (ref 6.0–8.3)

## 2010-07-25 LAB — CBC WITH DIFFERENTIAL/PLATELET
Basophils Relative: 0.6 % (ref 0.0–3.0)
Hemoglobin: 12.6 g/dL (ref 12.0–15.0)
Lymphocytes Relative: 35.5 % (ref 12.0–46.0)
MCHC: 34.3 g/dL (ref 30.0–36.0)
Monocytes Relative: 5.7 % (ref 3.0–12.0)
Neutro Abs: 3.2 10*3/uL (ref 1.4–7.7)
RBC: 4.04 Mil/uL (ref 3.87–5.11)

## 2010-07-25 LAB — BASIC METABOLIC PANEL
CO2: 30 mEq/L (ref 19–32)
Calcium: 8.7 mg/dL (ref 8.4–10.5)
GFR: 67.7 mL/min (ref >60.00)
Sodium: 143 mEq/L (ref 135–145)

## 2010-07-25 LAB — LIPID PANEL
HDL: 43 mg/dL (ref >39.00)
Total CHOL/HDL Ratio: 5

## 2010-07-25 LAB — URINALYSIS
Hgb urine dipstick: NEGATIVE
Ketones, ur: NEGATIVE
Urine Glucose: NEGATIVE
Urobilinogen, UA: 0.2 (ref 0.0–1.0)

## 2010-07-28 ENCOUNTER — Encounter: Payer: Self-pay | Admitting: Internal Medicine

## 2010-07-28 ENCOUNTER — Encounter (INDEPENDENT_AMBULATORY_CARE_PROVIDER_SITE_OTHER): Payer: BC Managed Care – PPO | Admitting: Internal Medicine

## 2010-07-28 DIAGNOSIS — Z2911 Encounter for prophylactic immunotherapy for respiratory syncytial virus (RSV): Secondary | ICD-10-CM

## 2010-07-28 DIAGNOSIS — M79609 Pain in unspecified limb: Secondary | ICD-10-CM | POA: Insufficient documentation

## 2010-07-28 DIAGNOSIS — Z23 Encounter for immunization: Secondary | ICD-10-CM

## 2010-07-28 DIAGNOSIS — Z0000 Encounter for general adult medical examination without abnormal findings: Secondary | ICD-10-CM

## 2010-07-28 DIAGNOSIS — Z136 Encounter for screening for cardiovascular disorders: Secondary | ICD-10-CM

## 2010-08-09 NOTE — Assessment & Plan Note (Signed)
Summary: PHYSICAL STC   Vital Signs:  Patient profile:   65 year old female Height:      60 inches Weight:      205 pounds BMI:     40.18 Temp:     98.5 degrees F oral Pulse rate:   72 / minute Pulse rhythm:   regular Resp:     16 per minute BP sitting:   120 / 80  (left arm) Cuff size:   large  Vitals Entered By: Lanier Prude, CMA(AAMA) (July 28, 2010 10:44 AM) CC: CPX Is Patient Diabetic? No Comments pt is not taking Lexapro   CC:  CPX.  History of Present Illness: The patient presents for a preventive health examination   Current Medications (verified): 1)  B-12 Microlozenge 500 Mcg  Subl (Cyanocobalamin) .Marland Kitchen.. 1 Tab By Mouth Once Daily 2)  Adult Aspirin Low Strength 81 Mg  Tbdp (Aspirin) .... Take 1 Tablet By Mouth Once A Day 3)  Verapamil Hcl Cr 240 Mg Cr-Tabs (Verapamil Hcl) .Marland Kitchen.. 1 By Mouth Qd 4)  Lovastatin 40 Mg  Tabs (Lovastatin) .... Take 1 Tablet By Mouth Once A Day 5)  Xanax 0.5 Mg  Tabs (Alprazolam) .... Take 1 Tablet By Mouth Two Times A Day As Needed 6)  Wellbutrin Sr 150 Mg Tb12 (Bupropion Hcl) .Marland Kitchen.. 1 Bid 7)  Zolpidem Tartrate 10 Mg  Tabs (Zolpidem Tartrate) .Marland Kitchen.. 1 By Mouth Once Daily 8)  Vitamin D3 1000 Unit  Tabs (Cholecalciferol) .Marland Kitchen.. 1 Qd 9)  Flonase 50 Mcg/act Susp (Fluticasone Propionate) .... 2 Sprays Each Day As Needed 10)  Fiorinal 50-325-40 Mg  Caps (Butalbital-Aspirin-Caffeine) .Marland Kitchen.. 1 By Mouth Qid As Needed Headache 11)  Protonix 40 Mg  Tbec (Pantoprazole Sodium) .Marland Kitchen.. 1 Each Day 30 Minutes Before Meal 12)  Lexapro 10 Mg Tabs (Escitalopram Oxalate) .... Take 1 Tab By Mouth Daily At [facility]  Allergies (verified): No Known Drug Allergies  Past History:  Past Surgical History: Last updated: 10/09/2008 colonoscopy  99 H/o gastric stapling egd  03 colon 03 right total knee arthroplasty 8/7; L 10/8 gastroplasty with subsequent takedown.  Tonsillectomy at age 61.  Appendectomy at age 21.  Cesarean section 1967. Cesarean section 1969.  Stomach stapling 1980  Hysterectomy 1993.  Reversal of stomach stapling 1998.  Cholecystectomy 2002. Tubal ligation 1985.  Family History: Last updated: 10/18/2008 Father had emphysema and heart disease.  Mother had   arthritis.  Remote relatives with colon cancer and diabetes.  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  No DM No hypertension Hyperlipidemia Gerd fibromyalgia Anxiety/depression Diastolic dysfuncion - mild 2011  Review of Systems       The patient complains of weight gain.  The patient denies anorexia, fever, weight loss, vision loss, decreased hearing, hoarseness, chest pain, syncope, dyspnea on exertion, peripheral edema, prolonged cough, headaches, hemoptysis, abdominal pain, melena, hematochezia, severe indigestion/heartburn, hematuria, incontinence, genital sores, muscle weakness, suspicious skin lesions, transient blindness, difficulty walking, depression, unusual weight change, abnormal bleeding, enlarged lymph nodes, angioedema, and breast masses.    Physical Exam  General:  Well-developed obese in no acute distress. Her skin is warm and dry. Eyes:  No corneal or conjunctival inflammation noted. EOMI. Perrla. Funduscopic  exam benign, without hemorrhages, exudates or papilledema. Vision grossly normal. Ears:  wax in R ear Nose:  nasal dischargemucosal pallor.   Mouth:  no exudates, no pharyngeal crowing, no lesions, no tongue abnormalities, pharyngeal erythema, tonsils absent, posterior lymphoid hypertrophy, and postnasal drip.   Neck:  supple with no bruits and no thyromegaly. Lungs:  clear to auscultation Heart:  regular rate and rhythm Abdomen:   soft and not tender. No masses. Msk:  No deformity or scoliosis noted of thoracic or lumbar spine.   Extremities:  no edema Neurologic:  grossly intact Skin:  Radial side index 6 mm lesion - a flat papule Cervical Nodes:  No lymphadenopathy noted Psych:  Oriented X3 and. No depressed affect.     Impression & Recommendations:  Problem # 1:  ROUTINE GENERAL MEDICAL EXAM@HEALTH  CARE FACL (ICD-V70.0) Assessment New GYN q 12 months - she had a nl BDS with her GYN We need to loose wt Colon due now -it is pending Health and age related issues were discussed. Available screening tests and vaccinations were discussed as well. Healthy life style including good diet and exercise was discussed.  The labs were reviewed with the patient.  EKG - wnl  Problem # 2:  FINGER PAIN (ICD-729.5)/lesion Assessment: New Triamc to try Hand surg cons if not better  Complete Medication List: 1)  B-12 Microlozenge 500 Mcg Subl (Cyanocobalamin) .Marland Kitchen.. 1 tab by mouth once daily 2)  Adult Aspirin Low Strength 81 Mg Tbdp (Aspirin) .... Take 1 tablet by mouth once a day 3)  Verapamil Hcl Cr 240 Mg Cr-tabs (Verapamil hcl) .Marland Kitchen.. 1 by mouth qd 4)  Lovastatin 40 Mg Tabs (Lovastatin) .... Take 1 tablet by mouth once a day 5)  Xanax 0.5 Mg Tabs (Alprazolam) .... Take 1 tablet by mouth two times a day as needed 6)  Wellbutrin Sr 150 Mg Tb12 (Bupropion hcl) .Marland Kitchen.. 1 bid 7)  Zolpidem Tartrate 10 Mg Tabs (Zolpidem tartrate) .Marland Kitchen.. 1 by mouth once daily 8)  Vitamin D3 1000 Unit Tabs (Cholecalciferol) .Marland Kitchen.. 1 qd 9)  Flonase 50 Mcg/act Susp (Fluticasone propionate) .... 2 sprays each day as needed 10)  Fiorinal 50-325-40 Mg Caps (Butalbital-aspirin-caffeine) .Marland Kitchen.. 1 by mouth qid as needed headache 11)  Protonix 40 Mg Tbec (Pantoprazole sodium) .Marland Kitchen.. 1 each day 30 minutes before meal 12)  Triamcinolone Acetonide 0.5 % Crea (Triamcinolone acetonide) .... Use two times a day prn  Other Orders: EKG w/ Interpretation (93000) Zoster  (Shingles) Vaccine Live (16109) Admin 1st Vaccine (60454)  Patient Instructions: 1)  Please schedule a follow-up appointment in 6 months. 2)  BMP prior to visit, ICD-9:401.1 3)  Vit B12 266.20 4)  Vit D 733.90 Prescriptions: PROTONIX 40 MG  TBEC (PANTOPRAZOLE SODIUM) 1 each day 30 minutes before meal  #90 x 3   Entered and Authorized by:   Tresa Garter MD   Signed by:   Tresa Garter MD on 07/28/2010   Method used:   Print then Give to Patient   RxID:   [id number] ZOLPIDEM TARTRATE 10 MG  TABS (ZOLPIDEM TARTRATE) 1 by mouth once daily  #90 x 1   Entered and Authorized by:   Tresa Garter MD   Signed by:   Tresa Garter MD on 07/28/2010   Method used:   Print then Give to Patient   RxID:   [id number] WELLBUTRIN SR 150 MG TB12 (BUPROPION HCL) 1 bid  #180 x 3   Entered and Authorized by:  Tresa Garter MD   Signed by:   Tresa Garter MD on 07/28/2010   Method used:   Print then Give to Patient   RxID:   [id number] XANAX 0.5 MG  TABS (ALPRAZOLAM) Take 1 tablet by mouth two times a day as needed  #90 x 3   Entered and Authorized by:   Tresa Garter MD   Signed by:   Tresa Garter MD on 07/28/2010   Method used:   Print then Give to Patient   RxID:   [id number] LOVASTATIN 40 MG  TABS (LOVASTATIN) Take 1 tablet by mouth once a day  #90 x 3   Entered and Authorized by:   Tresa Garter MD   Signed by:   Tresa Garter MD on 07/28/2010   Method used:   Print then Give to Patient   RxID:   [id number] VERAPAMIL HCL CR 240 MG CR-TABS (VERAPAMIL HCL) 1 by mouth qd  #90 x 3   Entered and Authorized by:   Tresa Garter MD   Signed by:   Tresa Garter MD on 07/28/2010   Method used:   Print then Give to Patient   RxID:   [id number] TRIAMCINOLONE ACETONIDE 0.5 % CREA (TRIAMCINOLONE ACETONIDE) use two times a day prn  #30g x 3   Entered and Authorized by:   Tresa Garter MD    Signed by:   Tresa Garter MD on 07/28/2010   Method used:   Print then Give to Patient   RxID:   [id number]    Orders Added: 1)  EKG w/ Interpretation [93000] 2)  Zoster (Shingles) Vaccine Live [90736] 3)  Admin 1st Vaccine [90471] 4)  Est. Patient 40-64 years [99396]   Immunizations Administered:  Zostavax # 1:    Vaccine Type: Zostavax    Site: left deltoid    Mfr: Merck    Dose: 0.5 ml    Route: [place]    Given by: Lanier Prude, CMA(AAMA)    Exp. Date: 02/03/2011    Lot #: [id number]    VIS given: 03/30/05 given July 28, 2010.   Immunizations Administered:  Zostavax # 1:    Vaccine Type: Zostavax    Site: left deltoid    Mfr: Merck    Dose: 0.5 ml    Route: [place]    Given by: Lanier Prude, CMA(AAMA)    Exp. Date: 02/03/2011    Lot #: [id number]    VIS given: 03/30/05 given July 28, 2010.

## 2010-09-08 ENCOUNTER — Telehealth: Payer: Self-pay | Admitting: *Deleted

## 2010-09-08 NOTE — Telephone Encounter (Signed)
[facility] called, there is an interaction w/lovastatin. Need to call [facility] to clarify.

## 2010-09-11 NOTE — Telephone Encounter (Signed)
Spoke w/[facility] - Pt is on lovastatin 40mg  and also on verapamil. There are new guidelines that recommend max of 10 mg of lovastatin when on verapamil. OK to continue same dose?

## 2010-09-13 ENCOUNTER — Telehealth: Payer: Self-pay

## 2010-09-13 NOTE — Telephone Encounter (Signed)
Patient next appointment is scheduled for 01/26/11. In the mean time, [facility] need to if pt should continue current medications in order to process her refill request. Please advise Thanks

## 2010-09-13 NOTE — Telephone Encounter (Signed)
Patient next appointment is scheduled for 01/26/11. In the mean time, [facility] need to if pt should continue current medications in order to process her refill request. Please advise Thanks   

## 2010-09-13 NOTE — Telephone Encounter (Signed)
ERROR, duplicate

## 2010-09-13 NOTE — Telephone Encounter (Signed)
We will discuss next time

## 2010-09-14 MED ORDER — LOVASTATIN 10 MG PO TABS: 10.0000 mg | ORAL_TABLET | Freq: Every day | ORAL | Status: DC

## 2010-09-14 NOTE — Telephone Encounter (Signed)
Take Lovastatin 10 mg/d

## 2010-09-15 NOTE — Telephone Encounter (Signed)
Spoke w/[facility], They said there is now no problems. You can discuss at next ov.

## 2010-09-20 ENCOUNTER — Other Ambulatory Visit: Payer: Self-pay | Admitting: Internal Medicine

## 2010-09-20 NOTE — Telephone Encounter (Signed)
Called patient and advised her to call me to make an appointment with Dr. Marina Goodell before she can have refills.  She has not been seen since 2009.

## 2010-09-21 ENCOUNTER — Other Ambulatory Visit: Payer: Self-pay | Admitting: *Deleted

## 2010-09-21 MED ORDER — PANTOPRAZOLE SODIUM 40 MG PO TBEC: 40.0000 mg | DELAYED_RELEASE_TABLET | Freq: Every day | ORAL | Status: DC

## 2010-09-21 NOTE — Telephone Encounter (Signed)
Pt. Needed appointment before giving refills to [facility].  Appt. Made for 10/30/10 and will send #30 to [facility] [address]

## 2010-10-17 ENCOUNTER — Telehealth: Payer: Self-pay | Admitting: *Deleted

## 2010-10-19 ENCOUNTER — Telehealth: Payer: Self-pay | Admitting: *Deleted

## 2010-10-19 NOTE — Telephone Encounter (Signed)
[facility] called - there is an interaction with verapamil, need to call for further clarification.

## 2010-10-19 NOTE — Telephone Encounter (Signed)
Left mess to call office back.   

## 2010-10-20 NOTE — Telephone Encounter (Signed)
[facility] left 2nd msge that there is a "very important prescription issue; a Severe Drug Alert on Verapamil", and that the order could go into "delay status" w/o MD confirmation. Tried to call back at number given: [phone] but could not get a representative to p/u line. The reference number w/[facility] is: [id number]

## 2010-10-23 NOTE — Telephone Encounter (Signed)
What is the interaction? Thx

## 2010-10-25 ENCOUNTER — Telehealth: Payer: Self-pay | Admitting: *Deleted

## 2010-10-25 NOTE — Telephone Encounter (Signed)
Noted. Thx.

## 2010-10-25 NOTE — Telephone Encounter (Signed)
Called [facility] re: prev phone note about interaction between lovastatin and verapamil. Spoke to [employer], pharmacist..Marland KitchenMarland KitchenHe states higher doses of Lovastatin used with verapamil increases the risk for rhabdomyolysis. They went ahead and dispensed the med to pt but just want you to be aware in case any changes in therapy need to be made.

## 2010-10-30 ENCOUNTER — Encounter: Payer: Self-pay | Admitting: Internal Medicine

## 2010-10-30 ENCOUNTER — Ambulatory Visit (INDEPENDENT_AMBULATORY_CARE_PROVIDER_SITE_OTHER): Payer: Medicare Other | Admitting: Internal Medicine

## 2010-10-30 VITALS — BP 134/70 | HR 76 | Ht 60.0 in | Wt 203.0 lb

## 2010-10-30 DIAGNOSIS — K5900 Constipation, unspecified: Secondary | ICD-10-CM

## 2010-10-30 DIAGNOSIS — Z1211 Encounter for screening for malignant neoplasm of colon: Secondary | ICD-10-CM

## 2010-10-30 DIAGNOSIS — K219 Gastro-esophageal reflux disease without esophagitis: Secondary | ICD-10-CM

## 2010-10-30 MED ORDER — PANTOPRAZOLE SODIUM 40 MG PO TBEC: 40.0000 mg | DELAYED_RELEASE_TABLET | Freq: Every day | ORAL | Status: DC

## 2010-10-30 NOTE — Progress Notes (Signed)
HISTORY OF PRESENT ILLNESS:  [name] is a 65 y.o. female gastroesophageal reflux disease, chronic constipation, morbid obesity status post gastroplasty with subsequent takedown of gastroplasty, question of short segment Barrett's esophagus with subsequent biopsies. Also history of hypertension and hyperlipidemia. She was last evaluated in April 2009 for constipation and reflux disease. Since that time she has been maintained on pantoprazole 40 mg daily. For the most part, GERD is well controlled. She will need to take TUMS a few times per week for breakthrough symptoms. She does report some mild intermittent dysphagia to rice. She has had this for some time and is unchanged. No weight loss. She requires about followup colonoscopy. Prior examinations in 1998 and again in 2003 were incomplete to the hepatic flexure but followed up with negative barium radiology. There has been no interval family history of colon cancer. Her bowel habits are unchanged and she continues to use MiraLax when necessary. No bleeding or other issues. Review of outside laboratory from February 2012 finds a normal CBC and comprehensive metabolic panel. She requests medication refill. Her bone density issues are being monitored by Dr. Posey Rea.  REVIEW OF SYSTEMS:  All non-GI ROS negative.  Past Medical History  Diagnosis Date  . Cholecystitis, unspecified   . Ulcer of esophagus   . Hyperlipidemia   . GERD (gastroesophageal reflux disease)   . Fibromyalgia   . Anxiety   . Diastolic dysfunction     Past Surgical History  Procedure Date  . Gatric stapling   . Total knee arthroplasty     rt.  . Gastroplasty   . Tonsillectomy   . Appendectomy   . Cesarean section   . Abdominal hysterectomy   . Revearsal of gastric stapling   . Cholecystectomy   . Tubal ligation     Social History [name]  reports that she has never smoked. She has never used smokeless tobacco. She reports that she does not drink  alcohol or use illicit drugs.  family history includes Arthritis in her mother; Colon cancer in her maternal aunt; Diabetes in her sister; Emphysema in her father; Heart disease in her father; and Hypertension in her mother.  No Known Allergies     PHYSICAL EXAMINATION:  Vital signs: BP 134/70  Pulse 76  Ht 5' (1.524 m)  Wt 203 lb (92.08 kg)  BMI 39.65 kg/m2 General: Well-developed, well-nourished, no acute distress HEENT: Sclerae are anicteric, conjunctiva pink. Oral mucosa intact Lungs: Clear Heart: Regular Abdomen: soft, obese, nontender, nondistended, no obvious ascites, no peritoneal signs, normal bowel sounds. No organomegaly. Extremities: No edema Psychiatric: alert and oriented x3. Cooperative     ASSESSMENT:  #1 GERD. Symptoms controlled with pantoprazole. Occasional breakthrough addressed with antacids #2. Chronic constipation. Stable #3. Question short segment Barrett's esophagus with subsequent negative biopsies   PLAN:  #1. Refill pantoprazole 40 mg daily #2. Continue antacids when necessary breakthrough #3. Continue MiraLax when necessary #4. Due for repeat screening colonoscopy next year. Would repeat EGD with biopsies at that time, as well.

## 2010-10-30 NOTE — Patient Instructions (Signed)
Your prescription has been sent to [facility] mail order pharmacy.

## 2010-10-31 ENCOUNTER — Encounter: Payer: Self-pay | Admitting: Internal Medicine

## 2010-10-31 NOTE — Consult Note (Signed)
[name], [name]                 ACCOUNT NO.:  [id number]   MEDICAL RECORD NO.:  [id number]          PATIENT TYPE:  INP   LOCATION:  5011                         FACILITY:  [facility]   PHYSICIAN:  Casimiro Needle B. Sherene Sires, MD, FCCPDATE OF BIRTH:  01[birth year]   DATE OF CONSULTATION:  03/20/2007  DATE OF DISCHARGE:                                 CONSULTATION   REASON FOR CONSULTATION:  Unexplained hypoxemia postop.   HISTORY:  This is a 65 year old white female never smoker with  degenerative arthritis, status post right knee replacement surgery in  August 2007, who recovered without complications but now is status post  left knee replacement on September 29 with desaturation when not on  oxygen.  Her she has noticed things feel a little bit different than  last time in that she states she has required three transfusions and  cannot take as deep a breath as she feels she needs.  However, she was  very vague regarding his complaint and is more focused on her oxygen  saturations than actually how she feels.  She had been using incentive  spirometry and able to get up over 2 L on inspiratory capacity.  She  denies any air classic pleuritic pain, significant chest pain,  significant cough, fevers, chills, sweats, nausea or vomiting.   PAST MEDICAL HISTORY:  1. Migraines.  2. Sinusitis.  3. GERD,  complicated by Barrett's.  4. Depression/anxiety.  5. Obesity.  6. B12 deficiency,  7. Diverticulosis.   PAST SURGICAL HISTORY:  She is status post right knee replacement August  2007 and also is status post gastric stapling.   ALLERGIES:  None known.   MEDICATIONS:  Presently she is on:   1. Ambien 10 mg q.h.s.  2. Celebrex 200 mg b.i.d.  3. Climara patches.  4. Colace b.i.d.  5. Isoptin 240 mg daily.  6. Lovenox 30 mg q.12h.  7. Protonix 80 mg daily.  8. Vitamin B12 daily p.o.  9. Vitamin D 800 units daily.  10.Xanax 0.5 mg b.i.d.  11.Zocor 20 mg q.p.m.  12.Multiple p.r.n.'s including  narcotics of muscle relaxants.   SOCIAL HISTORY:  She has never smoked.  She denies any alcohol use.   FAMILY HISTORY:  Positive emphysema in her father, who smoked and also  had heart disease.  Mother has arthritis.   REVIEW OF SYSTEMS:  Taken in detail and negative except as outlined  above.  She does have very mild dysphagia but swallowing liquids fine.  She denies choking on food.  Denies any active sinus symptoms.  She is  having quite a bit of pain in the operated knee.   PHYSICAL EXAMINATION:  This is a slightly anxious, somber white female  able to lie back with minimum elevation of the head of the bed and take  deep inspiratory maneuvers on the incentive spirometer, up over 2 L,  without associated cough.  She is afebrile with stable vital signs and has had no significant  fever.  Oropharynx is clear.  NECK:  Supple without cervical adenopathy or tenderness.  There is no  JVD.  There is no cervical adenopathy.  Lung fields revealed minimal decrease in breath sounds at the bases.  There is a regular rhythm without murmur, gallop rub, pulse rate is  around 100.  ABDOMEN:  Soft, benign with no palpable organomegaly, masses or  tenderness.  EXTREMITIES:  Warm without calf tenderness, cyanosis, clubbing.  There  was mild edema, left greater than right.   LAB DATA:  BMET is normal this morning.  CBC reveals a hemoglobin that  went from 8.8 to 10.5 after transfusion.  On 3 L she had a pH of 7.34  with a pCO2 of 50 and a pO2 of 144.   A chest x-ray is normal.   IMPRESSION:  Unexplained hypoxemia associated with mild tachycardia post  transfusion.  She may have a very low-grade post transfusion lung injury  that is below the radar screen in terms of being able to be detected  by chest x-ray.  Pulmonary embolism is always a concern but I note that  she has already had a negative venous Doppler this admission and is  therapeutically anticoagulated, so this is much less likely.   Most  likely she is simply not consistently taking enough deep breaths because  of the use of narcotics to control her leg pain (the evidence for this  is that the pCO2 is slightly elevated and would be lower in an acute  lung injury or pulmonary embolism in most patients).   1. Incentive spirometry and a CT scan of the chest to be complete.  2. She does have GERD with a history of Barrett's and having mild      dysphagia.  I recommended changing Protonix to b.i.d. before meals      and adding Reglan around the clock to reduce the risk of aspiration      or capital LPR from destabilizing the upper airway.      Charlaine Dalton. Sherene Sires, MD, [facility]  Electronically Signed     MBW/MEDQ  D:  03/20/2007  T:  03/21/2007  Job:  [id number]   cc:   Ollen Gross, [name]  [name], MD

## 2010-10-31 NOTE — Op Note (Signed)
[name], [name]                 ACCOUNT NO.:  [id number]   MEDICAL RECORD NO.:  [id number]          PATIENT TYPE:  INP   LOCATION:  2899                         FACILITY:  [facility]   PHYSICIAN:  [name], [name] DATE OF BIRTH:  [birth year]   DATE OF PROCEDURE:  03/17/2007  DATE OF DISCHARGE:                               OPERATIVE REPORT   SURGEON:  [name], [name]   ASSISTANTArlys John D. Petrarca, P.A.-C.   PREOPERATIVE DIAGNOSIS:  Right knee osteoarthritis.   POSTOPERATIVE DIAGNOSIS:  Right knee osteoarthritis.   PROCEDURE:  Right total knee arthroplasty.   INDICATIONS FOR PROCEDURE:  The patient is a 64 year old white female  enjoying a left total knee replacement.  Failure of conservative  measures for right knee osteoarthritis.  Informed consent was obtained.   DESCRIPTION OF PROCEDURE:  The patient laid supine, administered general  anesthesia.  Foley catheter placed.  Right leg was prepped and draped in  usual sterile fashion after preop femoral nerve block.  After sterile  prep and drape, the extremity was exsanguinated with Esmarch and  tourniquet inflated to 350 mmHg and set for an hour.  I then made a  midline incision with #10 blade.  I used a new #10 blade to make a  medial parapatellar arthrotomy and performed synovectomy.  Elevated deep  MCL off the medial crest of the tibia going down only 1 to 2 cm since  this was a valgus knee.  Then I everted the patella.  This was the area  of most wear.  I measured it to 20 and reamed with a 32-mm reamer down  to 12 mm thickness.  I then drilled three lug holes with 29 mm trial in  place and measured 20 mm.  I then flexed the knee, used the  extramedullary alignment system on the tibia to remove a perpendicular  cut of bone at the articular surface to the anatomic axis of the tibia.  Removed that cut surface and the jig.  Then made an intramedullary drill  hole in the femur.  I sent intramedullary guide on 4  degrees since this  was a valgus knee.  I pinned the distal femoral cutting block into place  and made a distal femoral cut with a sagittal saw.  Then marked out the  epicondylar axis.  Posterior condylar angle measured 5 degrees.  Then  sized to a size E pinned to 5 degrees external rotation holes.  I then  fastened 4 in 1 cutting block to the distal femur, made the anterior-  posterior and chamfer cuts with a sagittal saw.  I then removed the jig  and the cut surfaces.  I then placed a lamina spreader in the knee,  removed the ACL, PCL, medial and lateral menisci and posterior condylar  osteophytes.  A 10-mm spacer block fit in the knee easily and we had  good flexion/extension gap balance.  I then put a scoop retractor  posteriorly and finished the femur with a size E finishing block,  finished the tibia with a size 4 tibial tray,  drill and keel.  I then  trialed with a size 4 tibia, size E femur, size 10 insert, size 29  patella.  Had good flexion/extension gap balance, drop and dangle back  to 130 degrees.  The patella tracked well.  I then removed the trial  components, copiously irrigated.  I then placed the knee in with a size  4 tibia, size E femur, size 10 insert, removed all excess cement,  allowed cement harden in extension.  I cemented in the patella as well  removing excess cement and using a patella clamp.  I placed a Hemovac  coming out superolateral and deep to the arthrotomy, placed the pain  catheter coming out superomedially and superficial to the arthrotomy.  Once cement was hard let tourniquet down.  We placed Marcaine with  epinephrine in the capsule.  Closed the arthrotomy with figure-of-eight  #1 Vicryl sutures, deep soft tissues with buried 0 Vicryl sutures,  subcuticular 2-0 stitch and skin staples.  Dressed with Xeroform,  dressing sponges, sterile Webril, TED stocking.   COMPLICATIONS:  None.   DRAINS:  One Hemovac, one pain catheter.   ESTIMATED BLOOD  LOSS:  300 mL.   TOURNIQUET TIME:  46 minutes.           ______________________________  Mila Homer Sherlean Foot, [name]     SDL/MEDQ  D:  03/17/2007  T:  03/17/2007  Job:  [id number]

## 2010-10-31 NOTE — Op Note (Signed)
NAMEJODIANN, [name]                 ACCOUNT NO.:  [id number]   MEDICAL RECORD NO.:  [id number]          PATIENT TYPE:  INP   LOCATION:  5011                         FACILITY:  [facility]   PHYSICIAN:  [name], [name] DATE OF BIRTH:  December 06, 1945   DATE OF PROCEDURE:  03/17/2007  DATE OF DISCHARGE:  03/22/2007                               OPERATIVE REPORT   ADDENDUM:  In the original operative report dated March 17, 2007 I made a  mistake and put right knee replacement.  It is in fact left.  This was  mentioned in the indications for procedure, but then I made an error in  dictation.  From then on again it was the left.  I had previously  replaced the right.           ______________________________  [name], [name]     SDL/MEDQ  D:  05/12/2007  T:  05/12/2007  Job:  [id number]

## 2010-10-31 NOTE — Assessment & Plan Note (Signed)
[facility]                         GASTROENTEROLOGY OFFICE NOTE   NAME:SHOREAmmarie, [name]                        MRN:          [id number]  DATE:09/23/2007                            DOB:          1945/09/17    REASON FOR EVALUATION:  Constipation and reflux disease.   HISTORY:  This is a 65 year old female with a history of  gastroesophageal reflux disease, morbid obesity, status post  gastroplasty with subsequent takedown of the gastroplasty, question of  Barrett's esophagus with subsequent negative biopsies, hypertension and  hyperlipidemia.  She also has degenerative joint disease and is status  post bilateral knee replacement surgery.  The patient was last evaluated  in this office in 2004.  She presents now with complaints of chronic  constipation.  She states she has had constipation her entire life.  Problems have been worse over the past three weeks.  She rarely takes a  stool softener to stimulate her bowel movements.  No other medications  for constipation.  She will go as long as seven to 10 days without a  bowel movement.  She will develop abdominal pain and cramping prior to  defecation.  She denies weight loss or bleeding.  She did undergo a  colonoscopy in 2003.  This was negative to the hepatic flexure.  A  subsequent barium enema was negative.  In terms of reflux disease, she  is on Nexium twice daily.  On Nexium, no heartburn or indigestion.  She  is questioning about the possibility of an alternative proton pump  inhibitor due to her increasing co-pay.   ALLERGIES:  No known drug allergies.   CURRENT MEDICATIONS:  1. Estrogen tab.  2. Aspirin 81 mg daily.  3. Nexium 40 mg twice daily.  4. Verapamil XR.  5. Lovastatin 40 mg daily.  6. B12.  7. Ambien.   PHYSICAL EXAMINATION:  GENERAL:  A well-appearing female, in no acute  distress.  She is alert and oriented.  VITAL SIGNS:  Blood pressure 112/76, heart rate 80, respirations 18  and  unlabored, weight 185 pounds.  HEENT:  Sclerae anicteric.  Conjunctivae pink.  Oral mucosa intact.  NECK:  No adenopathy.  LUNGS:  Clear.  HEART:  Regular.  ABDOMEN:  Obese and soft, without tenderness, mass or hernia.  Prior  abdominal incisions are well-healed.  Good bowel sounds heard.  EXTREMITIES:  Without edema.   IMPRESSION:  1. Chronic constipation with associated abdominal pain.  Negative      colonoscopy and barium enema in 2003.  2. Chronic reflux disease.  Stable on Nexium.  3. Prior history of gastroplasty with subsequent takedown.  4. General medical problems, under the care of Dr. Georgina Quint.      Plotnikov.   RECOMMENDATIONS:  1. MiraLax 17 grams daily in 8 ounces of water.  Titrate to need.  2. Refill proton pump inhibitor therapy.  We will investigate,per her      request, to see if there is a more cost effective alternative to      her Nexium.  3. Screening colonoscopy in 2013, as well as  repeat endoscopy at that      time.     [name]. Marina Goodell, MD  Electronically Signed    JNP/MedQ  DD: 09/23/2007  DT: 09/23/2007  Job #: [id number]   cc:   [name], MD

## 2010-10-31 NOTE — Assessment & Plan Note (Signed)
[facility]                           PRIMARY CARE OFFICE NOTE   [name], [name]                        MRN:          [id number]  DATE:01/20/2007                            DOB:          Aug 25, 1945    REFERRING PHYSICIAN:  [name], [name]   REASON FOR CONSULTATION:  Preoperative medical clearance for left total  knee replacement surgery.   HISTORY OF PRESENT ILLNESS:  The patient is a 65 year old female with  several medical problems who presents today for medical clearance.  She  has no active complaints except for migraine headaches and hot flashes  that has been going on for a couple of weeks.  She seemed to have a  similar problem in the past in relation to her sinusitis.   REVIEW OF SYSTEMS:  Negative for chest pain or shortness of breath.  Positive for hot flashes, sweat, headaches and knee pain.   PAST MEDICAL HISTORY:  1. Migraine headaches.  2. History of fairly recent sinusitis confirmed by CT scan.  3. 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  4. GERD.  5. Depression.  6. Anxiety.  7. Obesity.  8. Vitamin B-12 deficiency.  9. Barrett's esophagitis.  10.Diverticulosis.   PAST SURGICAL HISTORY:  1. Total knee replacement on the right, February 04, 2006.  2. History of gastric stapling.   ALLERGIES:  No known drug allergies.   CURRENT MEDICATIONS:  1. Vitamin B12 injections.  2. Estrogen patch.  3. Nexium 40 mg b.i.d.  4. Verapamil XR 240 mg daily.  5. Lovastatin 40 mg daily (not taking).  6. Celebrex p.r.n.  7. Xanax 0.5 b.i.d. p.r.n.  8. Fiorinal p.r.n.  9. Imitrex p.r.n.   SOCIAL HISTORY:  She does not smoke or drink alcohol.   FAMILY HISTORY:  Father had emphysema and heart disease.  Mother had  arthritis.  Remote relatives with colon cancer and diabetes.   PHYSICAL EXAMINATION:  VITAL SIGNS:  Blood pressure 140/72, pulse 73,  temperature 97.0, weight 196 pounds.  GENERAL:  No acute distress.  HEENT:   Moist mucosa.  NECK:  Supple.  No thyromegaly or bruits.  LUNGS:  Clear.  No wheezes or rales.  HEART:  S1, S2.  No murmur or gallop.  ABDOMEN:  Soft, nontender, no organomegaly or masses.  EXTREMITIES:  Lower extremities without edema.  Calves nontender .  NEUROLOGICAL:  She is alert, oriented and cooperative.  Denies being  depressed.  Knee joints without swelling.  Tender with range of motion.  On the left, no meningeal sign.  Sinus palpation is normal.   LABORATORY DATA:  Cardiolite in 2007 normal.   ASSESSMENT/PLAN:  1. Preoperative clearance.  Would like to obtain blood work      preoperatively.  EKG today appears normal without acute changes.      Will obtain chest x-ray. Assuming all tests are negative,  she      should be clear for surgery.  DVT prophylaxis as usual.  She will      have to hold estrogen patch perioperatively.  2. Left knee osteoarthritis,  total knee replacement pending on the      left. She will take vitamin D3 1000 units daily.  3. Migraine headaches.  Medications refilled.  Could be aggravated by      sinusitis.  4. Possible sinusitis.  Ceftin 500 p.o. b.i.d. for two weeks with one      refill.  5. Vitamin B12 deficiency.  She has actually switched to B12 1000 mcg      daily p.o.     Karen Quint. Plotnikov, MD  Electronically Signed    AVP/MedQ  DD: 01/22/2007  DT: 01/22/2007  Job #: [id number]   cc:   [name], [name]

## 2010-11-03 NOTE — Op Note (Signed)
[name], [name]                 ACCOUNT NO.:  [id number]   MEDICAL RECORD NO.:  [id number]          PATIENT TYPE:  INP   LOCATION:  5022                         FACILITY:  [facility]   PHYSICIAN:  [name], [name] DATE OF BIRTH:  15-Mar-1946   DATE OF PROCEDURE:  02/04/2006  DATE OF DISCHARGE:                                 OPERATIVE REPORT   SURGEON:  [name], [name]   ASSISTANT:  Richardean Canal, P.A.   ANESTHESIA:  General.   PREOPERATIVE DIAGNOSIS:  Right knee osteoarthritis.   POSTOPERATIVE DIAGNOSIS:  Right knee osteoarthritis.   PROCEDURE:  Right total knee arthroplasty.   INDICATIONS FOR PROCEDURE:  Patient is a 65 year old white female with  failure of conservative measures for osteoarthritis of the knee.  Informed  consent was obtained.   DESCRIPTION OF PROCEDURE:  The patient was laid supine.  Administered  general anesthesia and laid in the supine position, Foley catheter was  placed.  I then sterilely prepped and draped the right leg.  I then used a  #10 blade to make a midline incision approximately 6 inches in length.  I  used a fresh blade to make a median parapatellar arthrotomy and performed a  synovectomy.  I then elevated the deep MCL off the medial crest of the  tibia.  I used the extramedullary alignment system on the tibia to make a  perpendicular cut to the anatomic axis of the tibia.  I removed 10 mm of  bone off of the lateral side.  The meniscus was divided and cut.  I then  everted the patella and measured it at actually 13 mm; it was worn severely  thin, so I reamed actually down to 12 mm to keep a good patellar piece of  bone.  I drilled for three lock holes and recreated a 20-mm patellar  thickness which I felt was very adequate.  At this point I turned my  attention to the femur, made an intramedullary drill hole in the femur.  I  set the intramedullary guide patella on 4 degrees of valgus cut, put it into  place and made the distal  femoral cut with the sagittal saw.  I then sized  to a size D and made the anterior, posterior and chamfer cuts with a four-in-  one deep cutting block in 5 degrees of external rotation.  At this point, I  removed the ACL, PCL, medial and lateral menisci and posterior condylar  osteophytes.  I obtained flexion-extension gap balance and then I finished  the femur with a size D finishing block, finished the tibia with a size 5  tibial tray, drilling a keel and then trialed with a D femur, 5 tibia, 10  insert, 32 patella.  I had good flexion-extension gap balance.  Dropped and  angled, was back to 120 to 125 degrees, limited only by the thigh-calf  ratio.  I then removed the trial components and copiously irrigated.  I then  cemented in the components and removed excess amounts with the knee in  extension.  Followed up with Hemovac  coming out superolaterally and deep in  the arthrotomy, the pain catheter coming out superomedially and superficial  to the arthrotomy.   I closed the arthrotomy with interrupted figure-of-eight #1 Vicryl sutures,  closed the deep soft tissues with interrupted 0 Vicryl stitches, and then  the subcuticular with 2-0 Vicryl stitches and skin with staples.  I then  dressed with Xeroform dressing, sponges, sterile Webril, and TED stocking.   COMPLICATIONS:  None.   DRAINS:  One Hemovac, one pain catheter.   ESTIMATED BLOOD LOSS:  300 mL.   TOURNIQUET TIME:  48 minutes.   DISPOSITION:  Patient was taken to the recovery room in stable condition.           ______________________________  Mila Homer Sherlean Foot, [name]     SDL/MEDQ  D:  02/04/2006  T:  02/04/2006  Job:  [id number]

## 2010-11-03 NOTE — Discharge Summary (Signed)
NAMEMERIDA, [name]                 ACCOUNT NO.:  [id number]   MEDICAL RECORD NO.:  [id number]          [name] TYPE:  INP   LOCATION:  5022                         FACILITY:  [facility]   PHYSICIAN:  [name], [name] DATE OF BIRTH:  [birth year]   DATE OF ADMISSION:  02/04/2006  DATE OF DISCHARGE:  02/07/2006                                 DISCHARGE SUMMARY   DATE OF ADMISSION:  February 04, 2006   DATE OF DISCHARGE:  February 07, 2006   ADMISSION DIAGNOSES:  1. End-stage osteoarthritis bilateral knees, right worse than left.  2. Obesity.  3. Hypertension.  4. Gastroesophageal reflux disease.  5. Barrett's esophagitis.  6. Migraines.  7. Dyslipidemia.  8. Anxiety disorder.  9. Vitamin B12 deficiency.  10.Diverticulosis.   DISCHARGE DIAGNOSES:  1. End-stage osteoarthritis bilateral knees, right worse than left.  2. Acute blood loss anemia secondary to surgery.  3. Hypokalemia now resolved.  4. Constipation.  5. Obesity.  6. Hypertension.  7. Gastroesophageal reflux disease.  8. Barrett's esophagitis.  9. Migraines.  10.Dyslipidemia.  11.Anxiety disorder.  12.Vitamin B12 deficiency.  13.Diverticulosis.   SURGICAL PROCEDURE:  On February 04, 2006, [name] underwent a right total  knee arthroplasty by Dr. Mila Homer. Lucey assisted by Richardean Canal, PA-C.  She had a NexGen Complete Knee Solution Legacy LPS femoral component size D  right placed with a NexGen LPS articular surface size Stripe Green CD 10 mm  height.  A fluted stem tibial component size 5 with a NexGen All-Poly  Patella standard size 8.5 mm thickness.   COMPLICATIONS:  None.   CONSULTS:  Physical therapy, occupational therapy and  progression nurse  consult February 05, 2006.   HISTORY OF PRESENT ILLNESS:  Karen 65 year old white female [name] presented  to Dr. Sherlean Foot with eight-to-nine-year history of sudden onset progressive  right knee pain.  She does have a history of a fall on the knees several  years  ago.  At Karen point, the pain is constant with any weightbearing.  It  is a burning to sharp sensation diffuse about the joint without radiation.  It increases with stairs,walking or going from a sitting to a standing  position and decreases with avoidance of these activities.  The knee pops,  grinds, swells and occasionally keeps her up at night.  She has failed  conservative treatment and x-rays show arthritic changes of the knee.  Because of Karen, she is presenting for a right knee replacement.   HOSPITAL COURSE:  [name] tolerated her surgical procedure well without  immediate postoperative complications.  She was transferred to 5000.  On  postop day one, she was afebrile, vitals stable.  Hemoglobin 10.6,  hematocrit 31.3, white count 16.5.  Leg was neurovascularly intact.  She was  started on therapy, white count ws monitored.  Oxygen was weaned.   On postop day two, again afebrile, vitals stable.  Hemoglobin 9.3,  hematocrit 27.4, white count had dropped to 10.  Right knee incision was  well approximated with staples with a small amount of serosanguineous  drainage.  Leg was  neurovascularly intact.  She was switched to p.o. pain  meds.  Her potassium was noted to be low at 3.4 and she was supplemented  with potassium orally.  Plans were made for possible discharge home the next  day.   On postop day three, she complains of being a little dizzy or lightheaded  when she gets out of bed.  Her blood pressure is low at 98/65 with a pulse  of 76.  Hemoglobin has dropped to 8.6 with hematocrit of 25.4.  She will be  transfused with one unit of packed red blood cells.  She is tolerating  therapy well and her pain is controlled with meds.  She is having some  difficulty with constipation which will be treated with a laxative.  She  will be discharged home later today after the transfusion is completed.  She  has had a bowel movement, she is doing well with therapy.   DISCHARGE  INSTRUCTIONS:  Diet:  She can resume her regular pre-  hospitalization diet.   MEDICATIONS:  She may resume her pre-hospitalization meds except no aspirin  while on Lovenox and no Vicodin while on other pain meds.  Home meds  included:  1. Celebrex 200 mg p.o. b.i.d.  2. Butalbital one to two tablets p.o. q.4 h. p.r.n. migraine.  3. Crestor 10 mg p.o. q.a.m.  4. Norco 5/325 mg one to two p.o. q.4 h. p.r.n. for pain which she will      not take while on other pain meds.  5. Verapamil SA 240 mg p.o. q.a.m.  6. Zolpidem 10 mg p.o. q.h.s.  7. Nexium 40 mg p.o. b.i.d.  8. Alprazolam 0.5 mg p.o. one-half to one tablet b.i.d. p.r.n. anxiety.  9. Estradiol patch one patch applied q. week.  10.Vitamin B12 one tablet p.o. q.a.m.  11.Multivitamin one tablet p.o. q.a.m.  12.Aspirin 81 mg p.o. q.a.m. which will resume once Lovenox is completed.   Additional meds at Karen time include:  1. Lovenox 40 mg subcutaneous q.a.m., last dose February 18, 2006, #11      with no refills.  2. Percocet 5/325 mg one to two p.o. q.4 h. p.r.n. for pain, #60 with no      refills.  3. Robaxin 500 mg one to two p.o. q.6 h. p.r.n. for spasms, #40 with no      refills.   ACTIVITY:  She can be out of bed weightbearing as tolerated on the right leg  with use of the walker.  She is to have home CPM zero to 100 degrees six to  eight hours a day and home health PT per [facility].  Please  see the blue total knee discharge sheet for further activity instructions.   WOUND CARE:  She may shower if there is no drainage from the wound for two  days.  Please see the blue total knee discharge sheet for further wound care  instructions.   FOLLOWUP:  She needs to follow up with Dr. Sherlean Foot in our office on Tuesday,  February 19, 2006.  She needs to call [phone] for that appointment.   LABORATORY DATA:  Chest x-ray done on January 29, 2006 showed minimal bronchitic changes of uncertain acuity.   Hemoglobin and  hematocrit have ranged from 13.1 and 38.7 on August 14th to a  low of 8.6 and 25.4 on August 23rd.  White count was 7.4 on August 14th,  went to a high of 16.5 on the 21st and on the  23rd is 8.1.  Platelets were  within normal limits.   Potassium dropped to a low of 3.4 on August 22nd and on August 23rd it is  4.5.  Glucose went to a high of 132 on August 21st.  Calcium to a low of 8.0  on August 22nd.   Vitamin B12 level is 1519 pg/mL.  Other laboratory studies are within normal  limits.      Legrand Pitts Duffy, P.A.    ______________________________  [name], [name]    KED/MEDQ  D:  02/07/2006  T:  02/07/2006  Job:  [id number]   cc:   [name], MD

## 2010-11-03 NOTE — Discharge Summary (Signed)
[name], [name]                 ACCOUNT NO.:  [id number]   MEDICAL RECORD NO.:  [id number]          PATIENT TYPE:  INP   LOCATION:  5011                         FACILITY:  [facility]   PHYSICIAN:  [name], [name] DATE OF BIRTH:  1946-01-21   DATE OF ADMISSION:  03/17/2007  DATE OF DISCHARGE:  03/22/2007                               DISCHARGE SUMMARY   ADMITTING DIAGNOSES:  1. Osteoarthritis left knee.  2. Gastroesophageal reflux disease.  3. Barrett's esophagus.  4. Hypertension.  5. Migraines.  6. Vitamin B12 deficiency.  7. Dyslipidemia.  8. Diverticulosis.  9. Anxiety disorder.   DISCHARGE DIAGNOSES:  1. Status post left total knee arthroplasty.  2. Hypoxia, unknown etiology.  3. Acute blood loss anemia secondary to surgery.  4. Dysphagia.  5. History of gastroesophageal reflux disease.  6. History of Barrett's esophagus.  7. Hypertension.  8. Migraines.  9. Vitamin B12 deficiency.  10.Dyslipidemia.  11.Diverticulosis.  12.Anxiety disorder.   HISTORY OF PRESENT ILLNESS:  [name] is a 65 year old female with a  history of right total knee arthroplasty January 23, 2007 who presents  with a 5 to 10 year history of gradual onset of progressively worsening  left knee pain.  No history of injury or surgery to the left knee.  The  pain is a constant sharp, burning diffuse pain without radiation.  Increased pain in the left knee going up and down stairs, weightbearing,  and getting out of a low chair.  Decreased pain with rest.  __________  arm patches.  Mechanical symptoms of __________  .  She does have aching  pain, no assistive devices.  She has had cortisone injections of the  left knee without any relief.   The patient was headed down to get left total knee arthroplasty.   SURGICAL PROCEDURE:  The patient was taken to the operating room on  March 17, 2007 by Dr. Georgena Spurling, assisted by Jacqualine Code,  P.A.-C.  The patient was placed under general  anesthesia, __________  nerve block, and he then underwent a right total knee arthroplasty.  The  following components were used:  A size E femoral component, a tibial  tray size 4 __________  stent, 10 mm bearing, and an all poly size 29,  8.0 mm thickness.  The patient tolerated the procedure well and returned  to recovery in good and stable condition.   CONSULTATIONS:  The following consults were obtained while the patient  was hospitalization:  1. Pulmonary, [place].  2. PT/OT.  3. Case management.   HOSPITAL COURSE:  Postoperative day 1, the patient denied any chest  pain, shortness of breath, nausea or vomiting.  Pain under good control.  The patient remained afebrile with vital signs stable.  H and H 8.6 and  25.6.  The patient's blood pressure was slightly low about 112/67, and  therefore, verapamil was held.  The patient was transfused 1 unit of  packed red blood cells on day 2.  Her hemoglobin __________  .   On postoperative day 2, the patient with some shortness of breath.  Stats on pulse ox varied from 70% to 100% on two liters.  The patient  __________  trial was equal and responsive.  Stat ABGs, Dopplers, chest  x-ray, all ordered.  Doppler showed no evidence of DVT or SVT.  Chest x-  ray showed mild decreased lung volume, __________  cardiac apex.  ABG:  O2 30 liters per minute, pH was 7.346, low, pCO2 was 50.4, high, pO2 was  144.0, bicarbonate was 267.8, elevated, __________ , TCO2 was normal at  28.4, base access was normal at 1.7.  O2 saturation 99.2%.  The  patient's temperature was 98 degrees Fahrenheit.  The patient's  scopolamine patch was discontinued.  The patient was ordered 2 units of  packed red blood cells and transfused with 20 mg Lasix IV between these.  The patient was reevaluated after returning from bilateral lower  extremity venous Dopplers, __________ .  Postoperative day 3, the  patient was without chest pain, nausea, vomiting, some shortness  of  breath with movement.  Afebrile.  Vital signs stable.  H and H was 10.5  and 31.7.  CT angiogram was ordered to rule out PE, was negative for PE.  This was ordered by [facility] due to the patient's unexplained  hypoxia.  The patient was found to have dysphagia, with a history of  GERD and Barrett's esophagus, __________  was started as was Reglan.   No definite aspiration.   Postoperative day 4, the patient overall much improved.  O2 saturations  were also improved.  The patient slowly progressed with physical  therapy.  Good pain control.  H and H stable at 10.2 and 32.5.  The  patient was afebrile and vital signs were stable.  Aggressive pulmonary  toilet was instituted.   Postoperative day 5, the patient was doing well.  Remained afebrile,  vital signs stable.  The patient was progressing with physical therapy  and was later discharged in good and stable condition.   LABORATORY DATA:  __________  on admission and CBC remains within normal  limits.   Coags:  All values within normal limits.  Routine chemistries on  admission, all values within normal limits.  Hepatic enzymes all values  within normal limits.  Urinalysis was negative on admission.   Doppler dated March 19, 2007 was negative for DVT, SVT, or baker's  cyst.   Chest CT angio dated March 20, 2007 showed no evidence of PE.   DISCHARGE INSTRUCTIONS:  1. Lovenox 40 mg injection 1 daily, last dose March 31, 2007.  2. Start aspirin 81 mg one tab daily on April 01, 2007.  3. Protonix 40 mg one tab twice a day.  4. Pepcid 20 mg one daily.  5. Reglan 10 mg tab four times a day.  6. Percocet 5/325 one to two tablets every 4 to 6 hours for pain.  7. Robaxin 5 mg one tab every 6 hours p.r.n. spasm.   HOME MEDS:  1. The patient is to continue on __________  0.5 mg one p.o. b.i.d.  2. Estradiol patch 0.1 mg weekly.  3. Nexium 40 mg one p.o. b.i.d.  4. Verapamil 240 mg one every a.m.  5. Ambien 10 mg one  p.o. q.h.s. p.r.n. insomnia.  6. Lovastatin 40 mg every a.m.  7. Vitamin B12 100 mcg subcutaneous a.m.  8. Imitrex 100 mg p.r.n. migraine.  9. Celebrex 200 mg one p.o. daily p.r.n.  10.Vitamin D 1000 International Units, 1 every a.m.   DIET:  No restrictions.  WOUND CARE:  The patient is to keep the wound clean and dry.  Check each  dressing daily.  Call for any signs of infection.   WEIGHTBEARING:  The patient is to be weightbearing as tolerated.   SPECIAL INSTRUCTIONS:  CPM at 0 to 90 degrees 6 to 8 hours a day.  Increase about 10 degrees daily.   FOLLOWUP:  1. The patient is to follow up with Dr. Sherlean Foot 9 days from discharge.      Call the office at [phone] for an appointment.  2. Follow up with Dr. Posey Rea in 10 to 14 days due to hypoxia.  The      patient is to contact his office for an appointment.   CONDITION ON DISCHARGE:  The patient was discharged to home in good and  stable condition.      Richardean Canal, P.A.    ______________________________  [name], [name]    GC/MEDQ  D:  04/28/2007  T:  04/28/2007  Job:  [id number]   cc:   [name], MD

## 2010-11-03 NOTE — H&P (Signed)
[name], [name]                 ACCOUNT NO.:  [id number]   MEDICAL RECORD NO.:  [id number]          PATIENT TYPE:  INP   LOCATION:  NA                           FACILITY:  [facility]   PHYSICIAN:  [name], [name] DATE OF BIRTH:  1946-01-17   DATE OF ADMISSION:  02/04/2006  DATE OF DISCHARGE:                                HISTORY & PHYSICAL   CHIEF COMPLAINT:  Right knee pain for the last 8-9 years.   HISTORY OF PRESENT ILLNESS:  This 65 year old white female patient presented  to Dr. Sherlean [name] with an 8-9 year history of sudden onset but progressively  worsening right knee pain.  She has a history of a fall on the right knee  approximately eight years ago and the pain never got much better.  She has  had no known surgery to the knee.  The pain at this time is constant with  any weight-bearing and a burning to sharp sensation diffuse about the joint  without radiation.  The pain increases with stairs, walking or going from a  sitting to a standing position and then decreases if she avoids these  activities and rests.  The knee does pop, grind, swell, and occasionally  keeps her up at night.  There is no catching, giving way, or locking of the  knee.  She is currently taking Celebrex and Vicodin for pain and that  provides moderate relief.  She has received cortisone injections in the past  with moderate relief.  She does not ambulate with any assistive devices.   ALLERGIES:  No known drug allergies.   CURRENT MEDICATIONS:  1. Alprazolam 0.5 mg 1 tablet p.o. b.i.d. p.r.n. anxiety.  2. Estradiol patch 0.5 mg apply one patch q.week.  3. Butalbital 1-2 tablets p.o. q.6h. p.r.n. migraines.  4. Nexium 40 mg 1 tablet p.o. b.i.d.  5. Verapamil 240 mg 1 tablet p.o. q.a.m.  6. Zolpidem 10 mg 1 tablet p.o. q.h.s.  7. Hydrocodone 5/500 mg 1-2 p.o. q.4h. p.r.n. for pain.  8. Crestor 10 mg 1 tablet p.o. q.a.m.  9. Aspirin 81 mg 1 tablet p.o. q.a.m., last dose August 14.  10.Vitamin B12  without 1000 mg 1 tablet p.o. q.a.m.  11.Multivitamin 1 tablet p.o. q.a.m.  12.Celebrex 200 mg 1 tablet p.o. b.i.d. to sometimes just daily.   PAST MEDICAL HISTORY:  1. Hypertension.  2. Gastroesophageal reflux disease.  3. Migraines.  4. Dyslipidemia.  5. Anxiety disorder.  6. Barrett's esophagus.  7. Vitamin B12 deficiency.  8. Diverticulosis.  9. Scheduled for cardiac stress test January 31, 2006.   PAST SURGICAL HISTORY:  1. Tonsillectomy at age 44.  2. Appendectomy at age 18.  3. Cesarean section 1967.  4. Cesarean section 1969.  5. Stomach stapling 1980.  6. Hysterectomy 1993.  7. Reversal of stomach stapling 1998.  8. Cholecystectomy 2002.  9. Tubal ligation 1985.   She reports the only complication she has had with surgery is severe nausea  and vomiting with anesthesia; with the last several surgeries, that had been  treated appropriately with meds.   SOCIAL HISTORY:  She denies any history of cigarette smoking, alcohol use,  or drug use.  She is married with two children.  She and her husband live in  a one-story house.  She works as an Engineer, petroleum.  Her  medical doctor is Dr. Macarthur Critchley Plotnikov of [place] and he has ordered the  stress test.  Her children are age 77 and 47 with the 52 year old now with  cardiomyopathy myopathy following a pregnancy.   FAMILY HISTORY:  Mother is alive at age 64 with hypertension.  Father died  at age of 91 with heart disease, heart attack and hypertension.  She has  four sisters ranging in age from 37 to 23, with a history of hypertension  and diabetes.  Grandparents did have a history of stroke.   REVIEW OF SYSTEMS:  She does wear glasses.  She has a history of  hypertension and palpitations in the remote past with a negative workup.  She does have nausea and vomiting at times due to Barrett's esophagus.  She  tends towards constipation most the time.  She does have anemia due to  vitamin B12 deficiency.  She  has had a remote history of a bladder infection  and just a history of headaches and migraines.  All other systems were  negative and noncontributory.   PHYSICAL EXAMINATION:  GENERAL:  Well-developed, well-nourished overweight white female in no acute  distress.  She walks with a slight limp.  Mood and affect are appropriate.  She talks easily with examiner.  VITAL SIGNS:  Height 5 feet 1 inch, weight 205 pounds, BMI is 38.  Temperature 98.4 degrees Fahrenheit, pulse 80, respirations 14 and BP  126/72.  HEENT:  Normocephalic, atraumatic without frontal or maxillary sinus  tenderness to palpation.  Conjunctivae pink.  Sclerae anicteric.  PERLA.  EOMs intact.  No visible external ear deformities.  Hearing grossly intact.  Tympanic membranes pearly gray bilaterally with good light reflex.  Nose and  nasal septum midline.  Nasal mucosa pink and moist without exudates or  polyps noted.  Buccal mucosa pink and moist.  Dentition in good repair.  Pharynx without erythema or exudates.  Tongue and uvula midline.  Tongue  without fasciculations and uvula rises equally with phonation.  NECK:  No visible masses or lesions noted..  Trachea midline.  No palpable  lymphadenopathy or thyromegaly.  Carotids +2 bilaterally without bruits.  Full range of motion, nontender to palpation along the cervical spine.  CARDIOVASCULAR:  Heart rate and rhythm regular.  S1 and S2 present without  rubs, clicks or murmurs noted.  RESPIRATORY:  Respirations even and unlabored.  Breath sounds clear to  auscultation bilaterally without rales or wheezes noted.  ABDOMEN:  Rounded abdominal contour.  Bowel sounds present x4 quadrants.  Soft, nontender to palpation without hepatosplenomegaly nor CVA tenderness.  Femoral pulses +2 bilaterally.  Nontender to palpation along the vertebral  column.  BREASTS/GU/RECTAL/PELVIC:  These exams deferred at this time. MUSCULOSKELETAL:  No obvious deformities bilateral upper extremities  with  full range of motion of these extremities without pain.  Radial pulses +2  bilaterally.  Full range of motion of her hips, ankles and toes bilaterally.  DP and PT pulses are +2.  No lower extremity edema.  No calf pain with  palpation.  Negative Homans' sign bilaterally.   Left knee skin intact.  No erythema or ecchymosis.  She has full extension  and flexion to 125 degrees with moderate crepitus.  There is mild  pain with  palpation along the medial joint line.  No effusion.  Stable to varus and  valgus stress.  Negative anterior drawer.  Right knee skin intact.  No  erythema or ecchymosis.  She has full extension and flexion to 120 degrees  with moderate crepitus with range of motion.  She is acutely tender to  palpation over the medial joint line.  No effusion.  Stable to varus and  valgus stress.  Negative anterior drawer.   NEUROLOGIC:  Alert and oriented x3.  Cranial nerves II-XII are grossly  intact.  Strength 5/5 bilateral upper and lower extremities.  Rapid  alternating movements intact.  Deep tendon reflexes 2+ bilateral upper and  lower extremities.  Sensation intact to light touch.   RADIOLOGIC FINDINGS:  X-rays taken of both knees in May 2007 showed bone-on-  bone contact in the patellofemoral compartments bilaterally and also lateral  compartment changes in both knees.   IMPRESSION:  1. End stage osteoarthritis bilateral knees, right worse than left.  2. Obesity.  3. Hypertension.  4. Gastroesophageal reflux disease.  5. Barrett's esophagitis.  6. Migraines.  7. Dyslipidemia.  8. Anxiety disorder.  9. Vitamin B12 deficiency.  10.Diverticulosis.   PLAN:  Ms. Calvario will be admitted to [facility] on February 04, 2006,  where she will undergo a right total knee arthroplasty by Dr. Mila Homer.  [name].  She will undergo all routine preoperative laboratory tests and  studies prior to this procedure.  If we have any medical issues while she is  hospitalized,  we will consult [place].      [name], P.A.    ______________________________  [name], [name]    KED/MEDQ  D:  01/29/2006  T:  01/29/2006  Job:  [id number]

## 2010-11-03 NOTE — Assessment & Plan Note (Signed)
[facility]                             PRIMARY CARE OFFICE NOTE   [name], [name]                        MRN:          [id number]  DATE:01/22/2006                            DOB:          [birth year]    REFERRING PHYSICIAN:  Dr. Sherlean Foot.   REASON FOR CONSULTATION:  Preop clearance for right total knee replacement  on February 04, 2006.   HISTORY OF PRESENT ILLNESS:  The patient is a 65 year old female who  presents for surgical clearance. She has no extra complaints except for  problems with her knee.   PAST MEDICAL HISTORY:  Migraine headaches, hypertension, hormone replacement  therapy, Barrett's esophagitis, vitamin B12 deficiency, anxiety, GERD,  diverticulosis, dyslipidemia.   FAMILY HISTORY:  Father has a history of coronary artery disease and  enlarged heart. No strokes in the family.   SOCIAL HISTORY:  She is married. Does not smoke.   ALLERGIES:  No known drug allergies.   CURRENT MEDICATIONS:  1.  Crestor 10 mg daily.  2.  Estrogen patch.  3.  Nexium 40 mg daily.   REVIEW OF SYSTEMS:  Negative for chest pain and shortness of breath. No  syncope, no neurologic complaints. Unable to lose weight. The rest is  negative.   PHYSICAL EXAMINATION:  VITAL SIGNS:  Blood pressure 136/84, pulse 77, temp  98.7, weight 209 pounds.  GENERAL:  She is no acute distress. She is overweight.  HEENT:  Moist mucosa.  NECK:  Supple, no thyromegaly or bruit.  LUNGS:  Clear. No wheezes or rales.  HEART:  S1, S2, no murmur, no gallop.  ABDOMEN:  Soft, nontender, no organomegaly, no masses felt.  EXTREMITIES:  Lower extremities without edema. Calves nontender.  NEUROLOGICAL:  She is alert, oriented, and cooperative. Knees grossly  without deformities. Right tender with range of motion. SKIN:  Clear with  aging changes.   IMPRESSION/RECOMMENDATIONS:  1.  [name] should be medically clear for right total knee replacement. In      the view of her  hyperlipidemia would like to obtain a Cardiolite stress      test prior to the procedure. Will schedule. The results will be sent to      Dr. Sherlean Foot. She is going to get preop lab work and chest x-ray with      anesthesia. Will be happy to see the patient in the hospital if needed.  2.  Dyslipidemia. Plan as above. She complains of Crestor's cost. Will      substitute for lovastatin 40 mg daily after surgery.  3.  Hypertension, well-controlled. Continue with current therapy.  4.  Vitamin B12 deficiency. Will include B12 level in her blood work. She is      on oral supplement.  5.  Barrett's esophagus. Will continue with current therapy.                                   Sonda Primes, MD   AP/MedQ  DD:  01/22/2006  DT:  01/22/2006  Job #:  [id number]   cc:   Pierce Crane. Sherlean Foot, MD

## 2010-11-03 NOTE — Assessment & Plan Note (Signed)
[facility]                         GASTROENTEROLOGY OFFICE NOTE   NAME:SHORECaley, Ciaramitaro                        MRN:          [id number]  DATE:07/01/2006                            DOB:          1945-10-27    HISTORY:  [name] presents today regarding worsening reflux. She has a  history of reflux disease, short segment Barrett's esophagus, prior  banded gastroplasty with subsequent takedown, obesity, esophageal  stricture regarding dilation, hyperlipidemia and migraine headaches.  Over the past several months, she has noticed worsening reflux as  manifested by burning. She generally takes Nexium 40 mg b.i.d. She is  finding breakthrough symptoms more frequently. Some dysphagia. Some  nausea and regurgitation. Occasional abdominal discomfort. No  hematemesis or melena. She has lost about 30 pounds intentionally.   ALLERGIES:  No known drug allergies.   CURRENT MEDICATIONS:  1. Estrogen patch once weekly.  2. Multivitamin.  3. Aspirin.  4. Nexium 40 mg b.i.d.  5. Verapamil 240 mg daily.  6. Lovastatin 40 mg daily.  7. B12 injections.  8. Celebrex.  9. Lidocaine patch.  10.Xanax.   FAMILY HISTORY:  Uncle and aunt with colon cancer   SOCIAL HISTORY:  Married with two children. Works as a Conservator, museum/gallery. Does  not smoke or use alcohol.   REVIEW OF SYSTEMS:  Per diagnostic evaluation.   PHYSICAL EXAMINATION:  Well appearing female in no acute distress. Blood  pressure is 112/74, heart rate 64, weight is 187.6 pounds. She is 5 feet  in height.  HEENT:  Sclerae anicteric. Oral mucosa intact. No thrush.  LUNGS:  Clear.  HEART:  Regular.  ABDOMEN:  Soft without tenderness, masses or hernia.   IMPRESSION:  Gastroesophageal reflux disease with history of short-  segment Barrett's esophagus . Currently with breakthrough symptoms on  b.i.d. Nexium. Last endoscopy three years ago (biopsy negative). Due for  surveillance at this time. Also some mild  dysphagia. Rule out recurrent  stricture.   RECOMMENDATIONS:  1. Continue b.i.d. Nexium.  2. Strict adherence to reflux precautions.  3. Antacids p.r.n. for breakthrough symptoms.  4. Schedule upper endoscopy with possible esophageal dilation. The      nature of the procedure as well as the risks, benefits and      alternatives have been reviewed. She understood and agreed to      proceed.     [name]. Marina Goodell, MD  Electronically Signed    JNP/MedQ  DD: 07/01/2006  DT: 07/02/2006  Job #: [id number]   cc:   [name], MD

## 2010-11-09 NOTE — Telephone Encounter (Signed)
error 

## 2011-01-19 ENCOUNTER — Other Ambulatory Visit (INDEPENDENT_AMBULATORY_CARE_PROVIDER_SITE_OTHER): Payer: Medicare Other

## 2011-01-19 ENCOUNTER — Other Ambulatory Visit: Payer: Self-pay | Admitting: Internal Medicine

## 2011-01-19 DIAGNOSIS — I10 Essential (primary) hypertension: Secondary | ICD-10-CM

## 2011-01-19 DIAGNOSIS — M899 Disorder of bone, unspecified: Secondary | ICD-10-CM

## 2011-01-19 DIAGNOSIS — E538 Deficiency of other specified B group vitamins: Secondary | ICD-10-CM

## 2011-01-19 DIAGNOSIS — M949 Disorder of cartilage, unspecified: Secondary | ICD-10-CM

## 2011-01-19 LAB — BASIC METABOLIC PANEL
BUN: 14 mg/dL (ref 6–23)
Calcium: 9.1 mg/dL (ref 8.4–10.5)
GFR: 61.95 mL/min (ref >60.00)
Glucose, Bld: 88 mg/dL (ref 70–99)
Potassium: 4.2 mEq/L (ref 3.5–5.1)

## 2011-01-25 ENCOUNTER — Encounter: Payer: Self-pay | Admitting: Internal Medicine

## 2011-01-26 ENCOUNTER — Encounter: Payer: Self-pay | Admitting: Internal Medicine

## 2011-01-26 ENCOUNTER — Ambulatory Visit (INDEPENDENT_AMBULATORY_CARE_PROVIDER_SITE_OTHER): Payer: Medicare Other | Admitting: Internal Medicine

## 2011-01-26 DIAGNOSIS — G4700 Insomnia, unspecified: Secondary | ICD-10-CM

## 2011-01-26 DIAGNOSIS — G2581 Restless legs syndrome: Secondary | ICD-10-CM

## 2011-01-26 DIAGNOSIS — E559 Vitamin D deficiency, unspecified: Secondary | ICD-10-CM

## 2011-01-26 DIAGNOSIS — E538 Deficiency of other specified B group vitamins: Secondary | ICD-10-CM

## 2011-01-26 DIAGNOSIS — F411 Generalized anxiety disorder: Secondary | ICD-10-CM

## 2011-01-26 DIAGNOSIS — E785 Hyperlipidemia, unspecified: Secondary | ICD-10-CM

## 2011-01-26 MED ORDER — GABAPENTIN 100 MG PO CAPS: 100.0000 mg | ORAL_CAPSULE | Freq: Every evening | ORAL | Status: DC | PRN

## 2011-01-26 NOTE — Assessment & Plan Note (Signed)
Controlled.  

## 2011-01-26 NOTE — Assessment & Plan Note (Signed)
On Rx 

## 2011-01-26 NOTE — Assessment & Plan Note (Signed)
PRN meds 

## 2011-01-26 NOTE — Progress Notes (Signed)
  Subjective:    Patient ID: [name], female    DOB: 1945-09-20, 65 y.o.   MRN: [id number]  HPI The patient presents for a follow-up of  chronic hypertension, chronic dyslipidemia, B12 def controlled with medicines    Review of Systems  Constitutional: Negative for chills, activity change, appetite change, fatigue and unexpected weight change.  HENT: Negative for congestion, mouth sores and sinus pressure.   Eyes: Negative for visual disturbance.  Respiratory: Negative for cough and chest tightness.   Gastrointestinal: Negative for nausea and abdominal pain.  Genitourinary: Negative for frequency, difficulty urinating and vaginal pain.  Musculoskeletal: Negative for back pain and gait problem.  Skin: Negative for pallor and rash.  Neurological: Negative for dizziness, tremors, weakness, numbness and headaches.  Psychiatric/Behavioral: Negative for confusion and sleep disturbance.       Objective:   Physical Exam  Constitutional: She appears well-developed. No distress.       Obese  HENT:  Head: Normocephalic.  Right Ear: External ear normal.  Left Ear: External ear normal.  Nose: Nose normal.  Mouth/Throat: Oropharynx is clear and moist.  Eyes: Conjunctivae are normal. Pupils are equal, round, and reactive to light. Right eye exhibits no discharge. Left eye exhibits no discharge.  Neck: Normal range of motion. Neck supple. No JVD present. No tracheal deviation present. No thyromegaly present.  Cardiovascular: Normal rate, regular rhythm and normal heart sounds.   Pulmonary/Chest: No stridor. No respiratory distress. She has no wheezes.  Abdominal: Soft. Bowel sounds are normal. She exhibits no distension and no mass. There is no tenderness. There is no rebound and no guarding.  Musculoskeletal: She exhibits no edema and no tenderness.  Lymphadenopathy:    She has no cervical adenopathy.  Neurological: She displays normal reflexes. No cranial nerve deficit. She exhibits  normal muscle tone. Coordination normal.  Skin: No rash noted. No erythema.  Psychiatric: She has a normal mood and affect. Her behavior is normal. Judgment and thought content normal.      Lab Results  Component Value Date   WBC 5.7 07/25/2010   HGB 12.6 07/25/2010   HCT 36.9 07/25/2010   PLT 227.0 07/25/2010   CHOL 196 07/25/2010   TRIG 166.0* 07/25/2010   HDL 43.00 07/25/2010   LDLDIRECT 154.9 01/20/2007   ALT 14 07/25/2010   AST 14 07/25/2010   NA 143 01/19/2011   K 4.2 01/19/2011   CL 107 01/19/2011   CREATININE 1.0 01/19/2011   BUN 14 01/19/2011   CO2 30 01/19/2011   TSH 0.77 07/25/2010       Assessment & Plan:

## 2011-01-26 NOTE — Assessment & Plan Note (Signed)
See Rx 

## 2011-01-26 NOTE — Assessment & Plan Note (Signed)
Wt Readings from Last 3 Encounters:  01/26/11 202 lb (91.627 kg)  10/30/10 203 lb (92.08 kg)  07/28/10 205 lb (92.987 kg)

## 2011-01-28 ENCOUNTER — Encounter: Payer: Self-pay | Admitting: Internal Medicine

## 2011-03-28 ENCOUNTER — Other Ambulatory Visit: Payer: Self-pay | Admitting: Gynecology

## 2011-03-28 DIAGNOSIS — Z1231 Encounter for screening mammogram for malignant neoplasm of breast: Secondary | ICD-10-CM

## 2011-03-29 LAB — CROSSMATCH

## 2011-03-29 LAB — DIFFERENTIAL
Basophils Relative: 1
Lymphs Abs: 2.7
Monocytes Absolute: 0.5
Monocytes Relative: 5
Neutro Abs: 6.2
Neutrophils Relative %: 65

## 2011-03-29 LAB — BLOOD GAS, ARTERIAL
Acid-Base Excess: 1.7
O2 Content: 3
O2 Saturation: 99.2
TCO2: 28.4
pCO2 arterial: 50.4 — ABNORMAL HIGH
pO2, Arterial: 144 — ABNORMAL HIGH

## 2011-03-29 LAB — COMPREHENSIVE METABOLIC PANEL
ALT: 12
Albumin: 3.7
Alkaline Phosphatase: 82
BUN: 11
Calcium: 8.7
Potassium: 3.6
Sodium: 139
Total Protein: 6.3

## 2011-03-29 LAB — PREPARE RBC (CROSSMATCH)

## 2011-03-29 LAB — CBC
HCT: 26.3 — ABNORMAL LOW
HCT: 30.5 — ABNORMAL LOW
HCT: 25.6 — ABNORMAL LOW
Hemoglobin: 10.2 — ABNORMAL LOW
Hemoglobin: 10.5 — ABNORMAL LOW
Hemoglobin: 8.6 — ABNORMAL LOW
MCHC: 33.6
MCHC: 33.7
MCV: 89.9
Platelets: 160
Platelets: 172
Platelets: 298
RBC: 2.92 — ABNORMAL LOW
RBC: 3.39 — ABNORMAL LOW
RBC: 2.87 — ABNORMAL LOW
RDW: 15.1 — ABNORMAL HIGH
RDW: 13.5
RDW: 13.9
WBC: 6.4
WBC: 8.6
WBC: 8.8

## 2011-03-29 LAB — URINALYSIS, ROUTINE W REFLEX MICROSCOPIC
Bilirubin Urine: NEGATIVE
Nitrite: NEGATIVE
Specific Gravity, Urine: 1.019
Urobilinogen, UA: 0.2
pH: 6

## 2011-03-29 LAB — BASIC METABOLIC PANEL
BUN: 6
CO2: 31
Calcium: 8.2 — ABNORMAL LOW
Calcium: 8.2 — ABNORMAL LOW
Chloride: 107
Creatinine, Ser: 0.7
GFR calc Af Amer: >60
GFR calc Af Amer: >60
GFR calc Af Amer: >60
GFR calc non Af Amer: >60
GFR calc non Af Amer: >60
GFR calc non Af Amer: >60
Glucose, Bld: 106 — ABNORMAL HIGH
Glucose, Bld: 96
Potassium: 4.2
Potassium: 4.4
Potassium: 4.5
Sodium: 141
Sodium: 141
Sodium: 140

## 2011-03-29 LAB — APTT: aPTT: 30

## 2011-03-29 LAB — PROTIME-INR: INR: 0.8

## 2011-04-12 ENCOUNTER — Other Ambulatory Visit: Payer: Self-pay | Admitting: Gynecology

## 2011-04-15 ENCOUNTER — Other Ambulatory Visit: Payer: Self-pay | Admitting: Internal Medicine

## 2011-05-07 ENCOUNTER — Ambulatory Visit
Admission: RE | Admit: 2011-05-07 | Discharge: 2011-05-07 | Disposition: A | Discharge status: Home / Self Care | Payer: Medicare Other | Source: Ambulatory Visit | Attending: Gynecology | Admitting: Gynecology

## 2011-05-07 DIAGNOSIS — Z1231 Encounter for screening mammogram for malignant neoplasm of breast: Secondary | ICD-10-CM

## 2011-05-21 ENCOUNTER — Ambulatory Visit (INDEPENDENT_AMBULATORY_CARE_PROVIDER_SITE_OTHER): Payer: Medicare Other | Admitting: *Deleted

## 2011-05-21 DIAGNOSIS — Z23 Encounter for immunization: Secondary | ICD-10-CM

## 2011-05-23 DIAGNOSIS — Z23 Encounter for immunization: Secondary | ICD-10-CM

## 2011-06-19 ENCOUNTER — Other Ambulatory Visit: Payer: Self-pay | Admitting: Internal Medicine

## 2011-07-02 ENCOUNTER — Encounter: Payer: Self-pay | Admitting: Gastroenterology

## 2011-07-02 ENCOUNTER — Encounter: Payer: Self-pay | Admitting: Internal Medicine

## 2011-07-05 ENCOUNTER — Encounter: Payer: Self-pay | Admitting: Internal Medicine

## 2011-07-17 ENCOUNTER — Ambulatory Visit (AMBULATORY_SURGERY_CENTER): Payer: Medicare Other

## 2011-07-17 VITALS — Ht 61.0 in | Wt 207.3 lb

## 2011-07-17 DIAGNOSIS — K2270 Barrett's esophagus without dysplasia: Secondary | ICD-10-CM

## 2011-07-17 DIAGNOSIS — Z800 Family history of malignant neoplasm of digestive organs: Secondary | ICD-10-CM

## 2011-07-17 DIAGNOSIS — Z1211 Encounter for screening for malignant neoplasm of colon: Secondary | ICD-10-CM

## 2011-07-17 MED ORDER — PEG-KCL-NACL-NASULF-NA ASC-C 100 G PO SOLR: 1.0000 | Freq: Once | ORAL | Status: AC

## 2011-07-31 ENCOUNTER — Ambulatory Visit (AMBULATORY_SURGERY_CENTER): Payer: Medicare Other | Admitting: Internal Medicine

## 2011-07-31 ENCOUNTER — Encounter: Payer: Self-pay | Admitting: Internal Medicine

## 2011-07-31 VITALS — BP 145/78 | HR 82 | Temp 97.4°F | Resp 14 | Ht 61.0 in | Wt 207.0 lb

## 2011-07-31 DIAGNOSIS — D126 Benign neoplasm of colon, unspecified: Secondary | ICD-10-CM

## 2011-07-31 DIAGNOSIS — Z800 Family history of malignant neoplasm of digestive organs: Secondary | ICD-10-CM

## 2011-07-31 DIAGNOSIS — K219 Gastro-esophageal reflux disease without esophagitis: Secondary | ICD-10-CM

## 2011-07-31 DIAGNOSIS — K2100 Gastro-esophageal reflux disease with esophagitis, without bleeding: Secondary | ICD-10-CM

## 2011-07-31 DIAGNOSIS — K2270 Barrett's esophagus without dysplasia: Secondary | ICD-10-CM

## 2011-07-31 DIAGNOSIS — Z1211 Encounter for screening for malignant neoplasm of colon: Secondary | ICD-10-CM

## 2011-07-31 MED ORDER — SODIUM CHLORIDE 0.9 % IV SOLN: 500.0000 mL | INTRAVENOUS | Status: DC

## 2011-07-31 MED ORDER — OMEPRAZOLE 40 MG PO CPDR: 40.0000 mg | DELAYED_RELEASE_CAPSULE | Freq: Two times a day (BID) | ORAL | Status: DC

## 2011-07-31 NOTE — Progress Notes (Signed)
Patient did not have preoperative order for IV antibiotic SSI prophylaxis. (G8918)  Patient did not experience any of the following events: a burn prior to discharge; a fall within the facility; wrong site/side/patient/procedure/implant event; or a hospital transfer or hospital admission upon discharge from the facility. (G8907)  

## 2011-07-31 NOTE — Op Note (Signed)
[facility] [address] [place], [place]  [address]  ENDOSCOPY PROCEDURE REPORT  PATIENT:  Laurian, Edrington  MR#:  [id number] BIRTHDATE:  09-Mar-1946, 65 yrs. old  GENDER:  female  ENDOSCOPIST:  [name]. Eda Keys, MD Referred by:  Surveillance Program Recall,  PROCEDURE DATE:  07/31/2011 PROCEDURE:  EGD, diagnostic 43235 ASA CLASS:  Class II INDICATIONS:  h/o Barrett's Esophagus ; ? short segment. none on last exam 07-2006; ACTIVE GERD SYMPTOMS ON PPI  MEDICATIONS:   MAC sedation, administered by CRNA, propofol (Diprivan) 100 mg IV TOPICAL ANESTHETIC:  none  DESCRIPTION OF PROCEDURE:   After the risks benefits and alternatives of the procedure were thoroughly explained, informed consent was obtained.  The LB GIF-H180 [id number] endoscope was introduced through the mouth and advanced to the second portion of the duodenum, without limitations.  The instrument was slowly withdrawn as the mucosa was fully examined. <<PROCEDUREIMAGES>>  Erosive Esophagitis was found in the distal esophagus. No Barrett's.  Post-operative change was noted in the stomach (prior gastric stapling, then take down).  There were multiple benign fundic gland type gastric polyps identified.  Otherwise the examination was normal.    Retroflexed views revealed no additional abnormalities.    The scope was then withdrawn from the patient and the procedure completed.  COMPLICATIONS:  None  ENDOSCOPIC IMPRESSION: 1) Esophagitis in the distal esophagus 2) Post-operative change 3) Polyps, multiple, stomach 4) Otherwise normal examination 5) NO BARRETT'S  RECOMMENDATIONS: 1) Anti-reflux regimen to be followed 2) OMEPRAZOLE 40MG  PO BID; #60; 11 REFILLS  ______________________________ [name]. Eda Keys, MD  CC:  Candise Hedges. Plotnikov, MD;  The Patient  n. eSIGNED:   [name]. Eda Keys at 07/31/2011 10:47 AM  Samuella Cota, [phone]

## 2011-07-31 NOTE — Op Note (Signed)
[facility] [address] [place], [place]  [address]  COLONOSCOPY PROCEDURE REPORT  PATIENT:  [name], [name]  MR#:  [id number] BIRTHDATE:  02-08-1946, 65 yrs. old  GENDER:  female ENDOSCOPIST:  [name]. Eda Keys, MD REF. BY:  Screening Recall PROCEDURE DATE:  07/31/2011 PROCEDURE:  Colonoscopy with snare polypectomy x 1 ASA CLASS:  Class II INDICATIONS:  colorectal cancer screening, average risk ; last exam 11-2001 negative (incomplete w/ f/u BE) MEDICATIONS:   MAC sedation, administered by CRNA, propofol (Diprivan) 550 mg IV  DESCRIPTION OF PROCEDURE:   After the risks benefits and alternatives of the procedure were thoroughly explained, informed consent was obtained.  Digital rectal exam was performed and revealed no abnormalities.   The LB 180AL [id number] endoscope was introduced through the anus and advanced to the cecum, which was identified by both the appendix and ileocecal valve, without limitations.  The quality of the prep was excellent, using MoviPrep.  The instrument was then slowly withdrawn as the colon was fully examined. <<PROCEDUREIMAGES>>  FINDINGS:  Redundant colon. A diminutive polyp was found in the descending colon and snared without cautery. Retrieval was successful.  Otherwise normal colonoscopy without other polyps, masses, vascular ectasias, or inflammatory changes.   Retroflexed views in the rectum revealed no abnormalities.    The time to cecum = 8:17  minutes. The scope was then withdrawn in  18:23 minutes from the cecum and the procedure completed.  COMPLICATIONS:  None  ENDOSCOPIC IMPRESSION: 1) Diminutive polyp in the descending colon - removed 2) Otherwise normal colonoscopy  RECOMMENDATIONS: 1) Repeat colonoscopy in 5 years if polyp adenomatous; otherwise 10 years  ______________________________ [name]. Eda Keys, MD  CC:  Nikita Hedges. Plotnikov, MD; The Patient  n. eSIGNED:   [name]. Eda Keys at 07/31/2011 10:33 AM  Samuella Cota,  [phone]

## 2011-07-31 NOTE — Patient Instructions (Signed)
Read the handouts given to you by your recovery room nurse.   Double your protonix-see directions.  You may resume your routine medications today.   If you have any questions, please call us at [phone].

## 2011-08-01 ENCOUNTER — Telehealth: Payer: Self-pay | Admitting: *Deleted

## 2011-08-01 NOTE — Telephone Encounter (Signed)
No answer, message left

## 2011-08-07 ENCOUNTER — Encounter: Payer: Self-pay | Admitting: Internal Medicine

## 2011-08-16 ENCOUNTER — Encounter (INDEPENDENT_AMBULATORY_CARE_PROVIDER_SITE_OTHER): Payer: Medicare Other | Admitting: Ophthalmology

## 2011-08-16 ENCOUNTER — Telehealth: Payer: Self-pay | Admitting: *Deleted

## 2011-08-16 DIAGNOSIS — H3400 Transient retinal artery occlusion, unspecified eye: Secondary | ICD-10-CM

## 2011-08-16 DIAGNOSIS — H547 Unspecified visual loss: Secondary | ICD-10-CM

## 2011-08-16 DIAGNOSIS — H3530 Unspecified macular degeneration: Secondary | ICD-10-CM

## 2011-08-16 DIAGNOSIS — H2510 Age-related nuclear cataract, unspecified eye: Secondary | ICD-10-CM

## 2011-08-16 DIAGNOSIS — H43819 Vitreous degeneration, unspecified eye: Secondary | ICD-10-CM

## 2011-08-16 NOTE — Telephone Encounter (Signed)
Dr Ashley Royalty called and spoke with dr Jens Som, will schedule carotid dopplers and follow up appt.

## 2011-08-24 ENCOUNTER — Encounter (INDEPENDENT_AMBULATORY_CARE_PROVIDER_SITE_OTHER): Payer: Medicare Other

## 2011-08-24 DIAGNOSIS — H53129 Transient visual loss, unspecified eye: Secondary | ICD-10-CM

## 2011-08-24 DIAGNOSIS — H547 Unspecified visual loss: Secondary | ICD-10-CM

## 2011-09-11 ENCOUNTER — Ambulatory Visit (INDEPENDENT_AMBULATORY_CARE_PROVIDER_SITE_OTHER): Payer: Medicare Other | Admitting: Internal Medicine

## 2011-09-11 ENCOUNTER — Encounter: Payer: Self-pay | Admitting: Internal Medicine

## 2011-09-11 VITALS — BP 142/78 | HR 81 | Temp 97.8°F | Ht 60.5 in | Wt 209.1 lb

## 2011-09-11 DIAGNOSIS — R05 Cough: Secondary | ICD-10-CM

## 2011-09-11 DIAGNOSIS — J019 Acute sinusitis, unspecified: Secondary | ICD-10-CM

## 2011-09-11 MED ORDER — LEVOFLOXACIN 500 MG PO TABS: 500.0000 mg | ORAL_TABLET | Freq: Every day | ORAL | Status: AC

## 2011-09-11 MED ORDER — PROMETHAZINE-CODEINE 6.25-10 MG/5ML PO SYRP: 5.0000 mL | ORAL_SOLUTION | ORAL | Status: AC | PRN

## 2011-09-11 NOTE — Progress Notes (Signed)
  Subjective:    HPI  complains of head cold and "sinus infection" symptoms  Onset >1 week ago, progressive symptoms  Initially associated with rhinorrhea, sneezing, sore throat, mild headache and low grade fever Progressively worse myalgias, sinus pressure and mild-mod chest congestion - cough worst at night No relief with OTC meds Precipitated by sick contacts and weather/season change  Past Medical History  Diagnosis Date  . Cholecystitis, unspecified   . Ulcer of esophagus   . GERD (gastroesophageal reflux disease)   . Fibromyalgia   . Anxiety   . Diastolic dysfunction   . ANEMIA, DUE TO BLOOD LOSS   . B12 DEFICIENCY   . DEPRESSION   . DIVERTICULOSIS, COLON   . DYSLIPIDEMIA   . GERD   . Headache   . HYPERTENSION   . INSOMNIA, PERSISTENT   . Morbid obesity   . OSTEOARTHRITIS   . SYNCOPE   . VITAMIN D DEFICIENCY     Review of Systems Constitutional: Chills x 3d but no night sweats, no unexpected weight change Pulmonary: No pleurisy or hemoptysis Cardiovascular: No chest pain or palpitations     Objective:   Physical Exam BP 142/78  Pulse 81  Temp(Src) 97.8 F (36.6 C) (Oral)  Ht 5' 0.5" (1.537 m)  Wt 209 lb 1.9 oz (94.856 kg)  BMI 40.17 kg/m2  SpO2 96% GEN: mildly ill appearing and audible head/chest congestion HENT: NCAT, mild frontal>maxillary sinus tenderness bilaterally, nares with clear discharge L turbinate swelling, oropharynx mild erythema, no exudate Eyes: Vision grossly intact, no conjunctivitis Lungs: Clear to auscultation without rhonchi or wheeze, no increased work of breathing Cardiovascular: Regular rate and rhythm, no bilateral edema      Assessment & Plan:  Acute sinusitis -  Cough, postnasal drip related to above    Empiric antibiotics prescribed due to symptom duration greater than 7 days Prescription cough suppression - new prescriptions done Symptomatic care with Tylenol or Advil, hydration and rest -  salt gargle advised as  needed

## 2011-09-11 NOTE — Patient Instructions (Signed)
It was good to see you today. Levaquin antibiotics and cough syrup as needed - Your prescription(s) have been submitted to your pharmacy. Please take as directed and contact our office if you believe you are having problem(s) with the medication(s). Alternate between ibuprofen and tylenol for aches, pain and fever symptoms as discussed Call if symptoms not improved on treatment, sooner if worse

## 2011-09-13 ENCOUNTER — Ambulatory Visit (INDEPENDENT_AMBULATORY_CARE_PROVIDER_SITE_OTHER): Payer: Medicare Other | Admitting: Cardiology

## 2011-09-13 ENCOUNTER — Encounter: Payer: Self-pay | Admitting: Cardiology

## 2011-09-13 VITALS — BP 121/71 | HR 92 | Wt 206.0 lb

## 2011-09-13 DIAGNOSIS — G459 Transient cerebral ischemic attack, unspecified: Secondary | ICD-10-CM

## 2011-09-13 DIAGNOSIS — I10 Essential (primary) hypertension: Secondary | ICD-10-CM

## 2011-09-13 DIAGNOSIS — H579 Unspecified disorder of eye and adnexa: Secondary | ICD-10-CM

## 2011-09-13 DIAGNOSIS — E785 Hyperlipidemia, unspecified: Secondary | ICD-10-CM

## 2011-09-13 NOTE — Assessment & Plan Note (Signed)
Patient with a "hazy" disturbance over the lateral aspect of her right eye. There was concern that this was potentially an embolic event. Carotid Dopplers unremarkable. Continue aspirin. Will arrange echocardiogram to exclude cardiac source of embolus. Otherwise she will follow up with ophthalmology.

## 2011-09-13 NOTE — Assessment & Plan Note (Signed)
Management per primary care. 

## 2011-09-13 NOTE — Assessment & Plan Note (Signed)
Blood pressure controlled. Continue present medications. 

## 2011-09-13 NOTE — Patient Instructions (Signed)
Your physician recommends that you schedule a follow-up appointment in: AS NEEDED PENDING TEST RESULTS  Your physician has requested that you have an echocardiogram. Echocardiography is a painless test that uses sound waves to create images of your heart. It provides your doctor with information about the size and shape of your heart and how well your heart's chambers and valves are working. This procedure takes approximately one hour. There are no restrictions for this procedure.    

## 2011-09-13 NOTE — Progress Notes (Signed)
HPI: Pleasant female I previously saw for for chest pain, palpitations and dizziness. The patient does have a prior Myoview performed in August of 2007. This was performed preoperatively. It showed an ejection fraction of 64% with no sign of scar or ischemia. An echocardiogram was performed in early May that showed normal LV function and no significant valvular abnormalities. Subsequently performed a stress echocardiogram that showed no significant abnormalities. We also placed a monitor which showed sinus rhythm with artifact. There was no significant rhythm disturbance but so far she has not had any further symptoms. I last saw her in June of 2010. I was recently contacted by Dr. Ashley Royalty of ophthalmology for concern of possible embolic event to her right eye. She has had a hazy visual disturbance over the lateral aspect of her right eye since December. This was initially intermittent but is now continuous. She denies dyspnea, chest pain, palpitations or syncope. Carotid Dopplers in March of 2013 showed 0-39% stenosis bilaterally.  Current Outpatient Prescriptions  Medication Sig Dispense Refill  . ALPRAZolam (XANAX) 0.5 MG tablet Take 0.5 mg by mouth 2 (two) times daily as needed.        Marland Kitchen aspirin 81 MG tablet Take 81 mg by mouth daily.        Marland Kitchen buPROPion (WELLBUTRIN) 75 MG tablet Take 75 mg by mouth 2 (two) times daily.        . butalbital-aspirin-caffeine-codeine (FIORINAL/CODEINE #3) 50-325-40-30 MG capsule Take 1 capsule by mouth 4 (four) times daily as needed.        . Cholecalciferol (VITAMIN D) 2000 UNITS CAPS Take 1 capsule by mouth daily.        . fish oil-omega-3 fatty acids 1000 MG capsule Take by mouth daily. Take  600 mg daily      . gabapentin (NEURONTIN) 100 MG capsule Take 1 capsule (100 mg total) by mouth at bedtime and may repeat dose one time if needed.  60 capsule  11  . levofloxacin (LEVAQUIN) 500 MG tablet Take 1 tablet (500 mg total) by mouth daily.  10 tablet  0  .  lovastatin (MEVACOR) 40 MG tablet TAKE 1 TABLET DAILY  90 tablet  2  . Multiple Vitamins-Minerals (OCUVITE PO) Take 2 tablets by mouth daily.      Marland Kitchen omeprazole (PRILOSEC) 40 MG capsule Take 1 capsule (40 mg total) by mouth 2 (two) times daily before a meal.  180 capsule  3  . solifenacin (VESICARE) 5 MG tablet Take 10 mg by mouth daily.        . verapamil (CALAN-SR) 240 MG CR tablet TAKE 1 TABLET DAILY  90 tablet  1  . vitamin B-12 (CYANOCOBALAMIN) 1000 MCG tablet Take 500 mcg by mouth daily.       Marland Kitchen zolpidem (AMBIEN) 10 MG tablet Take 10 mg by mouth at bedtime as needed.        Marland Kitchen CLIMARA 0.05 MG/24HR AS DIRECTED      . promethazine-codeine (PHENERGAN WITH CODEINE) 6.25-10 MG/5ML syrup Take 5 mLs by mouth every 4 (four) hours as needed for cough.  180 mL  0  . DISCONTD: verapamil (CALAN) 40 MG tablet Take 40 mg by mouth daily.           Past Medical History  Diagnosis Date  . Cholecystitis, unspecified   . Ulcer of esophagus   . GERD (gastroesophageal reflux disease)   . Fibromyalgia   . Anxiety   . Diastolic dysfunction   . ANEMIA, DUE TO  BLOOD LOSS   . B12 DEFICIENCY   . DEPRESSION   . DIVERTICULOSIS, COLON   . DYSLIPIDEMIA   . GERD   . HYPERTENSION   . INSOMNIA, PERSISTENT   . Morbid obesity   . OSTEOARTHRITIS   . SYNCOPE   . VITAMIN D DEFICIENCY     Past Surgical History  Procedure Date  . Gatric stapling   . Total knee arthroplasty     rt.  . Gastroplasty   . Tonsillectomy   . Appendectomy   . Cesarean section   . Abdominal hysterectomy   . Revearsal of gastric stapling   . Cholecystectomy   . Tubal ligation     History   Social History  . Marital Status: Married    Spouse Name: N/A    Number of Children: 2  . Years of Education: N/A   Occupational History  . office work    Social History Main Topics  . Smoking status: Never Smoker   . Smokeless tobacco: Never Used  . Alcohol Use: No  . Drug Use: No  . Sexually Active: Not on file   Other  Topics Concern  . Not on file   Social History Narrative  . No narrative on file    ROS: no fevers or chills, productive cough, hemoptysis, dysphasia, odynophagia, melena, hematochezia, dysuria, hematuria, rash, seizure activity, orthopnea, PND, pedal edema, claudication. Remaining systems are negative.  Physical Exam: Well-developed obese in no acute distress.  Skin is warm and dry.  HEENT is normal.  Neck is supple. No thyromegaly.  Chest is clear to auscultation with normal expansion.  Cardiovascular exam is regular rate and rhythm.  Abdominal exam nontender or distended. No masses palpated. Extremities show no edema. neuro grossly intact  ECG sinus rhythm at a rate of 92. No significant ST changes.

## 2011-09-17 ENCOUNTER — Other Ambulatory Visit: Payer: Self-pay | Admitting: Internal Medicine

## 2011-09-17 NOTE — Progress Notes (Signed)
Addended by: Reine Just on: 09/17/2011 01:58 PM   Modules accepted: Orders

## 2011-09-25 ENCOUNTER — Ambulatory Visit (HOSPITAL_COMMUNITY): Payer: Medicare Other | Attending: Cardiovascular Disease

## 2011-09-25 ENCOUNTER — Other Ambulatory Visit: Payer: Self-pay

## 2011-09-25 DIAGNOSIS — H539 Unspecified visual disturbance: Secondary | ICD-10-CM | POA: Insufficient documentation

## 2011-09-25 DIAGNOSIS — G459 Transient cerebral ischemic attack, unspecified: Secondary | ICD-10-CM

## 2011-09-25 DIAGNOSIS — E785 Hyperlipidemia, unspecified: Secondary | ICD-10-CM | POA: Insufficient documentation

## 2011-09-25 DIAGNOSIS — R55 Syncope and collapse: Secondary | ICD-10-CM | POA: Insufficient documentation

## 2011-09-25 DIAGNOSIS — Z8673 Personal history of transient ischemic attack (TIA), and cerebral infarction without residual deficits: Secondary | ICD-10-CM | POA: Insufficient documentation

## 2011-09-25 DIAGNOSIS — I10 Essential (primary) hypertension: Secondary | ICD-10-CM | POA: Insufficient documentation

## 2011-12-13 ENCOUNTER — Other Ambulatory Visit: Payer: Self-pay | Admitting: *Deleted

## 2011-12-13 NOTE — Telephone Encounter (Signed)
Gabapentin request to local pharmacy [last refill 08.10.12]/SLS Please advise.

## 2011-12-13 NOTE — Telephone Encounter (Signed)
OK to fill this prescription with additional refills x5 Thank you!  

## 2011-12-14 MED ORDER — GABAPENTIN 100 MG PO CAPS: 100.0000 mg | ORAL_CAPSULE | Freq: Every evening | ORAL | Status: DC | PRN

## 2011-12-14 NOTE — Telephone Encounter (Signed)
Done

## 2011-12-16 ENCOUNTER — Other Ambulatory Visit: Payer: Self-pay | Admitting: Internal Medicine

## 2011-12-17 ENCOUNTER — Other Ambulatory Visit: Payer: Self-pay | Admitting: *Deleted

## 2011-12-17 MED ORDER — GABAPENTIN 100 MG PO CAPS: 100.0000 mg | ORAL_CAPSULE | Freq: Every evening | ORAL | Status: DC | PRN

## 2011-12-19 ENCOUNTER — Other Ambulatory Visit: Payer: Self-pay | Admitting: Internal Medicine

## 2012-02-13 ENCOUNTER — Other Ambulatory Visit: Payer: Self-pay | Admitting: Internal Medicine

## 2012-03-17 ENCOUNTER — Other Ambulatory Visit: Payer: Self-pay | Admitting: Internal Medicine

## 2012-04-12 ENCOUNTER — Other Ambulatory Visit: Payer: Self-pay | Admitting: Internal Medicine

## 2012-05-07 ENCOUNTER — Other Ambulatory Visit: Payer: Self-pay | Admitting: Gynecology

## 2012-05-07 DIAGNOSIS — Z1231 Encounter for screening mammogram for malignant neoplasm of breast: Secondary | ICD-10-CM

## 2012-05-21 ENCOUNTER — Other Ambulatory Visit: Payer: Self-pay | Admitting: Internal Medicine

## 2012-05-22 ENCOUNTER — Other Ambulatory Visit: Payer: Self-pay | Admitting: Internal Medicine

## 2012-05-22 ENCOUNTER — Other Ambulatory Visit: Payer: Self-pay

## 2012-05-22 MED ORDER — LOVASTATIN 40 MG PO TABS: 40.0000 mg | ORAL_TABLET | Freq: Every day | ORAL | Status: DC

## 2012-06-09 ENCOUNTER — Encounter: Payer: Self-pay | Admitting: Internal Medicine

## 2012-06-09 ENCOUNTER — Ambulatory Visit (INDEPENDENT_AMBULATORY_CARE_PROVIDER_SITE_OTHER): Payer: Medicare Other | Admitting: Internal Medicine

## 2012-06-09 VITALS — BP 128/70 | HR 80 | Temp 99.2°F | Resp 16 | Wt 209.0 lb

## 2012-06-09 DIAGNOSIS — J069 Acute upper respiratory infection, unspecified: Secondary | ICD-10-CM

## 2012-06-09 MED ORDER — AZITHROMYCIN 250 MG PO TABS: ORAL_TABLET | ORAL | Status: DC

## 2012-06-09 MED ORDER — PROMETHAZINE-CODEINE 6.25-10 MG/5ML PO SYRP: 5.0000 mL | ORAL_SOLUTION | ORAL | Status: DC | PRN

## 2012-06-09 MED ORDER — FLUTICASONE PROPIONATE 50 MCG/ACT NA SUSP: 2.0000 | Freq: Every day | NASAL | Status: DC

## 2012-06-09 NOTE — Assessment & Plan Note (Signed)
Prom-cod syr Zpac if worse 

## 2012-06-09 NOTE — Progress Notes (Signed)
Patient ID: [name], female   DOB: 1945-08-07, 66 y.o.   MRN: [id number]  Subjective:    Patient ID: [name], female    DOB: [birth year], 66 y.o.   MRN: [id number]  Sinusitis This is a new problem. The current episode started in the past 7 days. Associated symptoms include congestion and coughing. Pertinent negatives include no chills, headaches or sinus pressure. Past treatments include acetaminophen. The treatment provided mild relief.  Cough Associated symptoms include rhinorrhea. Pertinent negatives include no chills, headaches or rash.   The patient presents for a follow-up of  chronic hypertension, chronic dyslipidemia, B12 def controlled with medicines    Review of Systems  Constitutional: Negative for chills, activity change, appetite change, fatigue and unexpected weight change.  HENT: Positive for congestion and rhinorrhea. Negative for mouth sores and sinus pressure.   Eyes: Negative for visual disturbance.  Respiratory: Positive for cough. Negative for chest tightness.   Gastrointestinal: Negative for nausea and abdominal pain.  Genitourinary: Negative for frequency, difficulty urinating and vaginal pain.  Musculoskeletal: Negative for back pain and gait problem.  Skin: Negative for pallor and rash.  Neurological: Negative for dizziness, tremors, weakness, numbness and headaches.  Psychiatric/Behavioral: Negative for confusion and sleep disturbance.       Objective:   Physical Exam  Constitutional: She appears well-developed. No distress.       Obese  HENT:  Head: Normocephalic.  Right Ear: External ear normal.  Left Ear: External ear normal.  Nose: Nose normal.  Mouth/Throat: Oropharynx is clear and moist.       eryth throt  Eyes: Conjunctivae normal are normal. Pupils are equal, round, and reactive to light. Right eye exhibits no discharge. Left eye exhibits no discharge.  Neck: Normal range of motion. Neck supple. No JVD present. No tracheal deviation  present. No thyromegaly present.  Cardiovascular: Normal rate, regular rhythm and normal heart sounds.   Pulmonary/Chest: No stridor. No respiratory distress. She has no wheezes.  Abdominal: Soft. Bowel sounds are normal. She exhibits no distension and no mass. There is no tenderness. There is no rebound and no guarding.  Musculoskeletal: She exhibits no edema and no tenderness.  Lymphadenopathy:    She has no cervical adenopathy.  Neurological: She displays normal reflexes. No cranial nerve deficit. She exhibits normal muscle tone. Coordination normal.  Skin: No rash noted. No erythema.  Psychiatric: She has a normal mood and affect. Her behavior is normal. Judgment and thought content normal.      Lab Results  Component Value Date   WBC 5.7 07/25/2010   HGB 12.6 07/25/2010   HCT 36.9 07/25/2010   PLT 227.0 07/25/2010   CHOL 196 07/25/2010   TRIG 166.0* 07/25/2010   HDL 43.00 07/25/2010   LDLDIRECT 154.9 01/20/2007   ALT 14 07/25/2010   AST 14 07/25/2010   NA 143 01/19/2011   K 4.2 01/19/2011   CL 107 01/19/2011   CREATININE 1.0 01/19/2011   BUN 14 01/19/2011   CO2 30 01/19/2011   TSH 0.77 07/25/2010   INR 0.8 03/13/2007       Assessment & Plan:

## 2012-06-17 ENCOUNTER — Ambulatory Visit: Payer: Medicare Other

## 2012-06-19 ENCOUNTER — Ambulatory Visit
Admission: RE | Admit: 2012-06-19 | Discharge: 2012-06-19 | Disposition: A | Discharge status: Home / Self Care | Payer: Medicare Other | Source: Ambulatory Visit | Attending: Gynecology | Admitting: Gynecology

## 2012-06-19 DIAGNOSIS — Z1231 Encounter for screening mammogram for malignant neoplasm of breast: Secondary | ICD-10-CM

## 2012-07-30 ENCOUNTER — Other Ambulatory Visit: Payer: Self-pay | Admitting: *Deleted

## 2012-07-30 ENCOUNTER — Telehealth: Payer: Self-pay | Admitting: *Deleted

## 2012-07-30 DIAGNOSIS — K219 Gastro-esophageal reflux disease without esophagitis: Secondary | ICD-10-CM

## 2012-07-30 MED ORDER — VERAPAMIL HCL ER 240 MG PO TBCR: 240.0000 mg | EXTENDED_RELEASE_TABLET | Freq: Every day | ORAL | Status: DC

## 2012-07-30 MED ORDER — BUTALBITAL-ASA-CAFF-CODEINE 50-325-40-30 MG PO CAPS: 1.0000 | ORAL_CAPSULE | Freq: Four times a day (QID) | ORAL | Status: DC | PRN

## 2012-07-30 MED ORDER — FLUTICASONE PROPIONATE 50 MCG/ACT NA SUSP: 2.0000 | Freq: Every day | NASAL | Status: DC

## 2012-07-30 MED ORDER — ALPRAZOLAM 0.5 MG PO TABS: 0.5000 mg | ORAL_TABLET | Freq: Two times a day (BID) | ORAL | Status: DC | PRN

## 2012-07-30 MED ORDER — OMEPRAZOLE 40 MG PO CPDR: 40.0000 mg | DELAYED_RELEASE_CAPSULE | Freq: Two times a day (BID) | ORAL | Status: DC

## 2012-07-30 MED ORDER — LOVASTATIN 40 MG PO TABS: 40.0000 mg | ORAL_TABLET | Freq: Every day | ORAL | Status: DC

## 2012-07-30 MED ORDER — GABAPENTIN 100 MG PO CAPS: 100.0000 mg | ORAL_CAPSULE | Freq: Every evening | ORAL | Status: DC | PRN

## 2012-07-30 MED ORDER — BUPROPION HCL ER (SR) 150 MG PO TB12: 150.0000 mg | ORAL_TABLET | Freq: Two times a day (BID) | ORAL | Status: DC

## 2012-07-30 NOTE — Telephone Encounter (Signed)
OK to fill both 90 d prescriptions with additional refills x 1 Thank you!

## 2012-07-30 NOTE — Telephone Encounter (Signed)
Pt requesting 90 day supp Rf on Alprazolam .5 mg and Butalb/ASA/Caff 50/325 to be sent to [facility]. Please advise.

## 2012-07-30 NOTE — Telephone Encounter (Signed)
Called pt- Rx sent to [facility]

## 2012-08-02 ENCOUNTER — Other Ambulatory Visit: Payer: Self-pay

## 2012-08-15 ENCOUNTER — Ambulatory Visit (INDEPENDENT_AMBULATORY_CARE_PROVIDER_SITE_OTHER): Payer: Medicare Other | Admitting: Ophthalmology

## 2013-01-21 ENCOUNTER — Other Ambulatory Visit: Payer: Self-pay

## 2013-03-22 ENCOUNTER — Other Ambulatory Visit: Payer: Self-pay | Admitting: Internal Medicine

## 2013-04-21 ENCOUNTER — Encounter: Payer: Self-pay | Admitting: Internal Medicine

## 2013-04-21 ENCOUNTER — Ambulatory Visit (INDEPENDENT_AMBULATORY_CARE_PROVIDER_SITE_OTHER): Payer: 59 | Admitting: Internal Medicine

## 2013-04-21 VITALS — BP 110/76 | HR 72 | Temp 98.8°F | Resp 16 | Wt 210.0 lb

## 2013-04-21 DIAGNOSIS — Z23 Encounter for immunization: Secondary | ICD-10-CM

## 2013-04-21 DIAGNOSIS — J069 Acute upper respiratory infection, unspecified: Secondary | ICD-10-CM

## 2013-04-21 DIAGNOSIS — I10 Essential (primary) hypertension: Secondary | ICD-10-CM

## 2013-04-21 MED ORDER — CEFUROXIME AXETIL 500 MG PO TABS: ORAL_TABLET | ORAL | Status: DC

## 2013-04-21 MED ORDER — PROMETHAZINE-CODEINE 6.25-10 MG/5ML PO SYRP: 5.0000 mL | ORAL_SOLUTION | ORAL | Status: DC | PRN

## 2013-04-21 NOTE — Assessment & Plan Note (Signed)
Wt Readings from Last 3 Encounters:  04/21/13 210 lb (95.255 kg)  06/09/12 209 lb (94.802 kg)  09/13/11 206 lb (93.441 kg)

## 2013-04-21 NOTE — Assessment & Plan Note (Signed)
Ceftin Prom-cod syr 

## 2013-04-21 NOTE — Progress Notes (Signed)
   Subjective:    Sinusitis This is a new problem. The current episode started 1 to 4 weeks ago (14 d). Associated symptoms include congestion and coughing. Pertinent negatives include no chills, headaches or sinus pressure. Past treatments include acetaminophen. The treatment provided mild relief.  Cough Associated symptoms include rhinorrhea. Pertinent negatives include no chills, headaches or rash.   The patient presents for a follow-up of  chronic hypertension, chronic dyslipidemia, B12 def controlled with medicines    Review of Systems  Constitutional: Negative for chills, activity change, appetite change, fatigue and unexpected weight change.  HENT: Positive for congestion and rhinorrhea. Negative for mouth sores and sinus pressure.   Eyes: Negative for visual disturbance.  Respiratory: Positive for cough. Negative for chest tightness.   Gastrointestinal: Negative for nausea and abdominal pain.  Genitourinary: Negative for frequency, difficulty urinating and vaginal pain.  Musculoskeletal: Negative for back pain and gait problem.  Skin: Negative for pallor and rash.  Neurological: Negative for dizziness, tremors, weakness, numbness and headaches.  Psychiatric/Behavioral: Negative for confusion and sleep disturbance.       Objective:   Physical Exam  Constitutional: She appears well-developed. No distress.  Obese  HENT:  Head: Normocephalic.  Right Ear: External ear normal.  Left Ear: External ear normal.  Nose: Nose normal.  Mouth/Throat: Oropharynx is clear and moist.  eryth throt  Eyes: Conjunctivae are normal. Pupils are equal, round, and reactive to light. Right eye exhibits no discharge. Left eye exhibits no discharge.  Neck: Normal range of motion. Neck supple. No JVD present. No tracheal deviation present. No thyromegaly present.  Cardiovascular: Normal rate, regular rhythm and normal heart sounds.   Pulmonary/Chest: No stridor. No respiratory distress. She has  no wheezes.  Abdominal: Soft. Bowel sounds are normal. She exhibits no distension and no mass. There is no tenderness. There is no rebound and no guarding.  Musculoskeletal: She exhibits no edema and no tenderness.  Lymphadenopathy:    She has no cervical adenopathy.  Neurological: She displays normal reflexes. No cranial nerve deficit. She exhibits normal muscle tone. Coordination normal.  Skin: No rash noted. No erythema.  Psychiatric: She has a normal mood and affect. Her behavior is normal. Judgment and thought content normal.      Lab Results  Component Value Date   WBC 5.7 07/25/2010   HGB 12.6 07/25/2010   HCT 36.9 07/25/2010   PLT 227.0 07/25/2010   CHOL 196 07/25/2010   TRIG 166.0* 07/25/2010   HDL 43.00 07/25/2010   LDLDIRECT 154.9 01/20/2007   ALT 14 07/25/2010   AST 14 07/25/2010   NA 143 01/19/2011   K 4.2 01/19/2011   CL 107 01/19/2011   CREATININE 1.0 01/19/2011   BUN 14 01/19/2011   CO2 30 01/19/2011   TSH 0.77 07/25/2010   INR 0.8 03/13/2007       Assessment & Plan:

## 2013-04-21 NOTE — Assessment & Plan Note (Signed)
Continue with current prescription therapy as reflected on the Med list. BP Readings from Last 3 Encounters:  04/21/13 110/76  06/09/12 128/70  09/13/11 121/71

## 2013-04-21 NOTE — Patient Instructions (Signed)
Use over-the-counter  "cold" medicines  such as  "Afrin" nasal spray for nasal congestion as directed instead. Use" Delsym" or" Robitussin" cough syrup varietis for cough.  You can use plain "Tylenol" or "Advil" for fever, chills and achyness.  Please, make an appointment if you are not better or if you're worse.  

## 2013-06-08 ENCOUNTER — Ambulatory Visit: Payer: 59 | Admitting: Internal Medicine

## 2013-06-09 ENCOUNTER — Encounter: Payer: Self-pay | Admitting: Internal Medicine

## 2013-06-09 ENCOUNTER — Ambulatory Visit (INDEPENDENT_AMBULATORY_CARE_PROVIDER_SITE_OTHER): Payer: Medicare Other | Admitting: Internal Medicine

## 2013-06-09 VITALS — BP 112/72 | HR 72 | Temp 97.8°F | Resp 16 | Wt 208.0 lb

## 2013-06-09 DIAGNOSIS — K117 Disturbances of salivary secretion: Secondary | ICD-10-CM

## 2013-06-09 DIAGNOSIS — J329 Chronic sinusitis, unspecified: Secondary | ICD-10-CM | POA: Insufficient documentation

## 2013-06-09 DIAGNOSIS — J328 Other chronic sinusitis: Secondary | ICD-10-CM

## 2013-06-09 DIAGNOSIS — R682 Dry mouth, unspecified: Secondary | ICD-10-CM

## 2013-06-09 LAB — GLUCOSE, POCT (MANUAL RESULT ENTRY): POC Glucose: 92 mg/dl (ref 70–99)

## 2013-06-09 MED ORDER — LEVOFLOXACIN 500 MG PO TABS: 500.0000 mg | ORAL_TABLET | Freq: Every day | ORAL | Status: DC

## 2013-06-09 MED ORDER — LORATADINE 10 MG PO TABS: 10.0000 mg | ORAL_TABLET | Freq: Every day | ORAL | Status: DC

## 2013-06-09 MED ORDER — METHYLPREDNISOLONE ACETATE 40 MG/ML IJ SUSP
80.0000 mg | Freq: Once | INTRAMUSCULAR | Status: AC
  Administered 2013-06-09: 80 mg via INTRAMUSCULAR

## 2013-06-09 NOTE — Progress Notes (Signed)
Patient ID: [name], female   DOB: [birth year], 67 y.o.   MRN: [id number]   Subjective:    Sinusitis This is a recurrent problem. The current episode started more than 1 month ago (14 d). The problem has been rapidly worsening since onset. Associated symptoms include congestion and coughing. Pertinent negatives include no chills, headaches or sinus pressure. Past treatments include acetaminophen. The treatment provided mild relief.  Cough This is a recurrent problem. The problem has been gradually improving. Associated symptoms include rhinorrhea. Pertinent negatives include no chills, headaches, rash or wheezing. Associated symptoms comments: sneezing. The treatment provided mild relief. There is no history of asthma.   The patient presents for a follow-up of  chronic hypertension, chronic dyslipidemia, B12 def controlled with medicines    Review of Systems  Constitutional: Negative for chills, activity change, appetite change, fatigue and unexpected weight change.  HENT: Positive for congestion and rhinorrhea. Negative for mouth sores and sinus pressure.   Eyes: Negative for visual disturbance.  Respiratory: Positive for cough. Negative for chest tightness and wheezing.   Gastrointestinal: Negative for nausea and abdominal pain.  Genitourinary: Negative for frequency, difficulty urinating and vaginal pain.  Musculoskeletal: Negative for back pain and gait problem.  Skin: Negative for pallor and rash.  Neurological: Negative for dizziness, tremors, weakness, numbness and headaches.  Psychiatric/Behavioral: Negative for confusion and sleep disturbance.       Objective:   Physical Exam  Constitutional: She appears well-developed. No distress.  Obese  HENT:  Head: Normocephalic.  Right Ear: External ear normal.  Left Ear: External ear normal.  Nose: Nose normal.  Mouth/Throat: Oropharynx is clear and moist.  eryth throt  Eyes: Conjunctivae are normal. Pupils are equal, round,  and reactive to light. Right eye exhibits no discharge. Left eye exhibits no discharge.  Neck: Normal range of motion. Neck supple. No JVD present. No tracheal deviation present. No thyromegaly present.  Cardiovascular: Normal rate, regular rhythm and normal heart sounds.   Pulmonary/Chest: No stridor. No respiratory distress. She has no wheezes.  Abdominal: Soft. Bowel sounds are normal. She exhibits no distension and no mass. There is no tenderness. There is no rebound and no guarding.  Musculoskeletal: She exhibits no edema and no tenderness.  Lymphadenopathy:    She has no cervical adenopathy.  Neurological: She displays normal reflexes. No cranial nerve deficit. She exhibits normal muscle tone. Coordination normal.  Skin: No rash noted. No erythema.  Psychiatric: She has a normal mood and affect. Her behavior is normal. Judgment and thought content normal.      Lab Results  Component Value Date   WBC 5.7 07/25/2010   HGB 12.6 07/25/2010   HCT 36.9 07/25/2010   PLT 227.0 07/25/2010   CHOL 196 07/25/2010   TRIG 166.0* 07/25/2010   HDL 43.00 07/25/2010   LDLDIRECT 154.9 01/20/2007   ALT 14 07/25/2010   AST 14 07/25/2010   NA 143 01/19/2011   K 4.2 01/19/2011   CL 107 01/19/2011   CREATININE 1.0 01/19/2011   BUN 14 01/19/2011   CO2 30 01/19/2011   TSH 0.77 07/25/2010   INR 0.8 03/13/2007       Assessment & Plan:

## 2013-06-09 NOTE — Patient Instructions (Signed)
Sinus rinse  Netti pot

## 2013-06-09 NOTE — Assessment & Plan Note (Signed)
11/14 refractory Levaquin x 2 wks Loratidine Sinus CT

## 2013-06-09 NOTE — Progress Notes (Signed)
Pre visit review using our clinic review tool, if applicable. No additional management support is needed unless otherwise documented below in the visit note. 

## 2013-06-12 ENCOUNTER — Inpatient Hospital Stay: Admission: RE | Admit: 2013-06-12 | Payer: Medicare Other | Source: Ambulatory Visit

## 2013-06-16 ENCOUNTER — Other Ambulatory Visit (INDEPENDENT_AMBULATORY_CARE_PROVIDER_SITE_OTHER): Payer: 59

## 2013-06-16 ENCOUNTER — Encounter: Payer: Self-pay | Admitting: Internal Medicine

## 2013-06-16 ENCOUNTER — Ambulatory Visit (INDEPENDENT_AMBULATORY_CARE_PROVIDER_SITE_OTHER): Payer: Medicare Other | Admitting: Internal Medicine

## 2013-06-16 ENCOUNTER — Ambulatory Visit: Payer: 59

## 2013-06-16 VITALS — BP 120/68 | HR 76 | Temp 96.8°F | Resp 16 | Ht 60.05 in | Wt 204.0 lb

## 2013-06-16 DIAGNOSIS — J329 Chronic sinusitis, unspecified: Secondary | ICD-10-CM

## 2013-06-16 DIAGNOSIS — Z0000 Encounter for general adult medical examination without abnormal findings: Secondary | ICD-10-CM

## 2013-06-16 DIAGNOSIS — E538 Deficiency of other specified B group vitamins: Secondary | ICD-10-CM

## 2013-06-16 DIAGNOSIS — I10 Essential (primary) hypertension: Secondary | ICD-10-CM

## 2013-06-16 DIAGNOSIS — D500 Iron deficiency anemia secondary to blood loss (chronic): Secondary | ICD-10-CM

## 2013-06-16 DIAGNOSIS — Z23 Encounter for immunization: Secondary | ICD-10-CM

## 2013-06-16 LAB — CBC WITH DIFFERENTIAL/PLATELET
Basophils Relative: 0.5 % (ref 0.0–3.0)
Eosinophils Relative: 2 % (ref 0.0–5.0)
HCT: 38.5 % (ref 36.0–46.0)
Hemoglobin: 12.8 g/dL (ref 12.0–15.0)
Lymphs Abs: 2.7 10*3/uL (ref 0.7–4.0)
MCHC: 33.2 g/dL (ref 30.0–36.0)
MCV: 88.5 fl (ref 78.0–100.0)
Monocytes Absolute: 0.5 10*3/uL (ref 0.1–1.0)
Monocytes Relative: 5.2 % (ref 3.0–12.0)
Platelets: 260 10*3/uL (ref 150.0–400.0)
RBC: 4.35 Mil/uL (ref 3.87–5.11)
WBC: 8.7 10*3/uL (ref 4.5–10.5)

## 2013-06-16 LAB — URINALYSIS
Bilirubin Urine: NEGATIVE
Hgb urine dipstick: NEGATIVE
Leukocytes, UA: NEGATIVE
Nitrite: NEGATIVE
Total Protein, Urine: NEGATIVE
pH: 5.5 (ref 5.0–8.0)

## 2013-06-16 LAB — IBC PANEL
Iron: 65 ug/dL (ref 42–145)
Saturation Ratios: 16.5 % — ABNORMAL LOW (ref 20.0–50.0)

## 2013-06-16 LAB — HEPATIC FUNCTION PANEL
AST: 12 U/L (ref 0–37)
Albumin: 4.2 g/dL (ref 3.5–5.2)
Alkaline Phosphatase: 82 U/L (ref 39–117)
Total Bilirubin: 0.7 mg/dL (ref 0.3–1.2)

## 2013-06-16 LAB — BASIC METABOLIC PANEL
BUN: 13 mg/dL (ref 6–23)
CO2: 30 mEq/L (ref 19–32)
Chloride: 105 mEq/L (ref 96–112)
GFR: 77 mL/min (ref >60.00)
Potassium: 4.1 mEq/L (ref 3.5–5.1)
Sodium: 141 mEq/L (ref 135–145)

## 2013-06-16 LAB — TSH: TSH: 1.26 u[IU]/mL (ref 0.35–5.50)

## 2013-06-16 LAB — VITAMIN B12: Vitamin B-12: 669 pg/mL (ref 211–911)

## 2013-06-16 LAB — LIPID PANEL
Total CHOL/HDL Ratio: 4
VLDL: 19.4 mg/dL (ref 0.0–40.0)

## 2013-06-16 LAB — LDL CHOLESTEROL, DIRECT: Direct LDL: 137.2 mg/dL

## 2013-06-16 MED ORDER — ALPRAZOLAM 0.5 MG PO TABS: 0.5000 mg | ORAL_TABLET | Freq: Two times a day (BID) | ORAL | Status: DC | PRN

## 2013-06-16 NOTE — Progress Notes (Signed)
   Subjective:    HPI  The patient is here for a wellness exam. The patient has been doing well overall without major physical or psychological issues going on lately.  C/o stress w/husband - he needs AAA surgery Sinusitis is better - stopped Levaquin after 5 d due to n/v.  The patient presents for a follow-up of  chronic hypertension, chronic dyslipidemia, B12 def controlled with medicines  Wt Readings from Last 3 Encounters:  06/16/13 204 lb (92.534 kg)  06/09/13 208 lb (94.348 kg)  04/21/13 210 lb (95.255 kg)   BP Readings from Last 3 Encounters:  06/16/13 120/68  06/09/13 112/72  04/21/13 110/76      Review of Systems  Constitutional: Negative for chills, activity change, appetite change, fatigue and unexpected weight change.  HENT: Negative for congestion, mouth sores and sinus pressure.   Eyes: Negative for visual disturbance.  Respiratory: Negative for cough and chest tightness.   Gastrointestinal: Negative for nausea and abdominal pain.  Genitourinary: Negative for frequency, difficulty urinating and vaginal pain.  Musculoskeletal: Negative for back pain and gait problem.  Skin: Negative for pallor and rash.  Neurological: Negative for dizziness, tremors, weakness, numbness and headaches.  Psychiatric/Behavioral: Negative for confusion and sleep disturbance.       Objective:   Physical Exam  Constitutional: She appears well-developed. No distress.  Obese  HENT:  Head: Normocephalic.  Right Ear: External ear normal.  Left Ear: External ear normal.  Nose: Nose normal.  Mouth/Throat: Oropharynx is clear and moist.  Eyes: Conjunctivae are normal. Pupils are equal, round, and reactive to light. Right eye exhibits no discharge. Left eye exhibits no discharge.  Neck: Normal range of motion. Neck supple. No JVD present. No tracheal deviation present. No thyromegaly present.  Cardiovascular: Normal rate, regular rhythm and normal heart sounds.   Pulmonary/Chest: No  stridor. No respiratory distress. She has no wheezes.  Abdominal: Soft. Bowel sounds are normal. She exhibits no distension and no mass. There is no tenderness. There is no rebound and no guarding.  Musculoskeletal: She exhibits no edema and no tenderness.  Lymphadenopathy:    She has no cervical adenopathy.  Neurological: She displays normal reflexes. No cranial nerve deficit. She exhibits normal muscle tone. Coordination normal.  Skin: No rash noted. No erythema.  Psychiatric: She has a normal mood and affect. Her behavior is normal. Judgment and thought content normal.      Lab Results  Component Value Date   WBC 5.7 07/25/2010   HGB 12.6 07/25/2010   HCT 36.9 07/25/2010   PLT 227.0 07/25/2010   CHOL 196 07/25/2010   TRIG 166.0* 07/25/2010   HDL 43.00 07/25/2010   LDLDIRECT 154.9 01/20/2007   ALT 14 07/25/2010   AST 14 07/25/2010   NA 143 01/19/2011   K 4.2 01/19/2011   CL 107 01/19/2011   CREATININE 1.0 01/19/2011   BUN 14 01/19/2011   CO2 30 01/19/2011   TSH 0.77 07/25/2010   INR 0.8 03/13/2007       Assessment & Plan:

## 2013-06-16 NOTE — Assessment & Plan Note (Signed)
Continue with current prescription therapy as reflected on the Med list.  

## 2013-06-16 NOTE — Assessment & Plan Note (Addendum)
Here for medicare wellness/physical  Diet: heart healthy  Physical activity: not sedentary  Depression/mood screen: negative  Hearing: intact to whispered voice  Visual acuity: grossly normal, performs annual eye exam  ADLs: capable  Fall risk: none  Home safety: good  Cognitive evaluation: intact to orientation, naming, recall and repetition  EOL planning: adv directives, full code/ I agree  I have personally reviewed and have noted  1. The patient's medical and social history  2. Their use of alcohol, tobacco or illicit drugs  3. Their current medications and supplements  4. The patient's functional ability including ADL's, fall risks, home safety risks and hearing or visual impairment.  5. Diet and physical activities  6. Evidence for depression or mood disorders    Today patient counseled on age appropriate routine health concerns for screening and prevention, each reviewed and up to date or declined. Immunizations reviewed and up to date or declined. Labs ordered and reviewed. Risk factors for depression reviewed and negative. Hearing function and visual acuity are intact. ADLs screened and addressed as needed. Functional ability and level of safety reviewed and appropriate. Education, counseling and referrals performed based on assessed risks today. Patient provided with a copy of personalized plan for preventive services.  OB/GYN and mammo is scheduled

## 2013-06-16 NOTE — Assessment & Plan Note (Signed)
CT pending later today

## 2013-06-16 NOTE — Assessment & Plan Note (Signed)
labs

## 2013-06-16 NOTE — Progress Notes (Signed)
Pre visit review using our clinic review tool, if applicable. No additional management support is needed unless otherwise documented below in the visit note. 

## 2013-06-19 ENCOUNTER — Ambulatory Visit (INDEPENDENT_AMBULATORY_CARE_PROVIDER_SITE_OTHER)
Admission: RE | Admit: 2013-06-19 | Discharge: 2013-06-19 | Disposition: A | Discharge status: Home / Self Care | Payer: 59 | Source: Ambulatory Visit | Attending: Internal Medicine | Admitting: Internal Medicine

## 2013-06-19 DIAGNOSIS — J329 Chronic sinusitis, unspecified: Secondary | ICD-10-CM

## 2013-06-21 ENCOUNTER — Other Ambulatory Visit: Payer: Self-pay | Admitting: Internal Medicine

## 2013-06-21 MED ORDER — ALIGN 4 MG PO CAPS: 1.0000 | ORAL_CAPSULE | Freq: Every day | ORAL | Status: DC

## 2013-06-21 MED ORDER — AMOXICILLIN-POT CLAVULANATE 875-125 MG PO TABS: 1.0000 | ORAL_TABLET | Freq: Two times a day (BID) | ORAL | Status: DC

## 2013-06-22 ENCOUNTER — Encounter: Payer: Self-pay | Admitting: Internal Medicine

## 2013-07-07 ENCOUNTER — Other Ambulatory Visit: Payer: Self-pay

## 2013-07-07 DIAGNOSIS — Z1231 Encounter for screening mammogram for malignant neoplasm of breast: Secondary | ICD-10-CM

## 2013-07-08 ENCOUNTER — Ambulatory Visit
Admission: RE | Admit: 2013-07-08 | Discharge: 2013-07-08 | Disposition: A | Discharge status: Home / Self Care | Payer: Medicare Other | Source: Ambulatory Visit

## 2013-07-08 DIAGNOSIS — Z1231 Encounter for screening mammogram for malignant neoplasm of breast: Secondary | ICD-10-CM

## 2013-07-29 ENCOUNTER — Other Ambulatory Visit: Payer: Self-pay | Admitting: Internal Medicine

## 2013-08-17 ENCOUNTER — Ambulatory Visit (INDEPENDENT_AMBULATORY_CARE_PROVIDER_SITE_OTHER): Payer: 59 | Admitting: Ophthalmology

## 2013-08-17 DIAGNOSIS — H35039 Hypertensive retinopathy, unspecified eye: Secondary | ICD-10-CM

## 2013-08-17 DIAGNOSIS — I10 Essential (primary) hypertension: Secondary | ICD-10-CM

## 2013-08-17 DIAGNOSIS — H43819 Vitreous degeneration, unspecified eye: Secondary | ICD-10-CM

## 2013-08-17 DIAGNOSIS — H3530 Unspecified macular degeneration: Secondary | ICD-10-CM

## 2013-08-25 ENCOUNTER — Encounter: Payer: Self-pay | Admitting: Internal Medicine

## 2013-08-25 ENCOUNTER — Other Ambulatory Visit: Payer: Self-pay | Admitting: Internal Medicine

## 2013-08-25 ENCOUNTER — Ambulatory Visit (INDEPENDENT_AMBULATORY_CARE_PROVIDER_SITE_OTHER): Payer: 59 | Admitting: Internal Medicine

## 2013-08-25 ENCOUNTER — Ambulatory Visit (INDEPENDENT_AMBULATORY_CARE_PROVIDER_SITE_OTHER)
Admission: RE | Admit: 2013-08-25 | Discharge: 2013-08-25 | Disposition: A | Discharge status: Home / Self Care | Payer: 59 | Source: Ambulatory Visit | Attending: Internal Medicine | Admitting: Internal Medicine

## 2013-08-25 VITALS — BP 118/72 | HR 92 | Temp 98.4°F | Resp 16 | Wt 209.0 lb

## 2013-08-25 DIAGNOSIS — R05 Cough: Secondary | ICD-10-CM

## 2013-08-25 DIAGNOSIS — R059 Cough, unspecified: Secondary | ICD-10-CM

## 2013-08-25 DIAGNOSIS — J329 Chronic sinusitis, unspecified: Secondary | ICD-10-CM

## 2013-08-25 MED ORDER — AZITHROMYCIN 250 MG PO TABS
ORAL_TABLET | ORAL | Status: DC
Start: 2013-08-25 — End: 2013-08-25

## 2013-08-25 MED ORDER — FLUCONAZOLE 150 MG PO TABS: 150.0000 mg | ORAL_TABLET | Freq: Once | ORAL | Status: DC

## 2013-08-25 MED ORDER — PROMETHAZINE-CODEINE 6.25-10 MG/5ML PO SYRP: 5.0000 mL | ORAL_SOLUTION | ORAL | Status: DC | PRN

## 2013-08-25 MED ORDER — AMOXICILLIN-POT CLAVULANATE 875-125 MG PO TABS: 1.0000 | ORAL_TABLET | Freq: Two times a day (BID) | ORAL | Status: DC

## 2013-08-25 MED ORDER — AZITHROMYCIN 250 MG PO TABS: ORAL_TABLET | ORAL | Status: DC

## 2013-08-25 NOTE — Assessment & Plan Note (Signed)
?  re-occurred See Meds

## 2013-08-25 NOTE — Progress Notes (Signed)
Pre visit review using our clinic review tool, if applicable. No additional management support is needed unless otherwise documented below in the visit note. 

## 2013-08-25 NOTE — Assessment & Plan Note (Addendum)
CXR ?CAP R Zpac Augmentin Prom - cod syr Diflucan

## 2013-08-25 NOTE — Progress Notes (Signed)
   Subjective:    HPI    C/o sinus congestion, cough x 1 week  The patient presents for a follow-up of  chronic hypertension, chronic dyslipidemia, B12 def controlled with medicines  Wt Readings from Last 3 Encounters:  08/25/13 209 lb (94.802 kg)  06/16/13 204 lb (92.534 kg)  06/09/13 208 lb (94.348 kg)   BP Readings from Last 3 Encounters:  08/25/13 118/72  06/16/13 120/68  06/09/13 112/72      Review of Systems  Constitutional: Negative for chills, activity change, appetite change, fatigue and unexpected weight change.  HENT: Negative for congestion, mouth sores and sinus pressure.   Eyes: Negative for visual disturbance.  Respiratory: Negative for cough and chest tightness.   Gastrointestinal: Negative for nausea and abdominal pain.  Genitourinary: Negative for frequency, difficulty urinating and vaginal pain.  Musculoskeletal: Negative for back pain and gait problem.  Skin: Negative for pallor and rash.  Neurological: Negative for dizziness, tremors, weakness, numbness and headaches.  Psychiatric/Behavioral: Negative for confusion and sleep disturbance.       Objective:   Physical Exam  Constitutional: She appears well-developed. No distress.  Obese  HENT:  Head: Normocephalic.  Right Ear: External ear normal.  Left Ear: External ear normal.  Nose: Nose normal.  Mouth/Throat: Oropharynx is clear and moist.  Eyes: Conjunctivae are normal. Pupils are equal, round, and reactive to light. Right eye exhibits no discharge. Left eye exhibits no discharge.  Neck: Normal range of motion. Neck supple. No JVD present. No tracheal deviation present. No thyromegaly present.  Cardiovascular: Normal rate, regular rhythm and normal heart sounds.   Pulmonary/Chest: No stridor. No respiratory distress. She has no wheezes.  Abdominal: Soft. Bowel sounds are normal. She exhibits no distension and no mass. There is no tenderness. There is no rebound and no guarding.   Musculoskeletal: She exhibits no edema and no tenderness.  Lymphadenopathy:    She has no cervical adenopathy.  Neurological: She displays normal reflexes. No cranial nerve deficit. She exhibits normal muscle tone. Coordination normal.  Skin: No rash noted. No erythema.  Psychiatric: She has a normal mood and affect. Her behavior is normal. Judgment and thought content normal.  eryth nares Crackles R base    Lab Results  Component Value Date   WBC 8.7 06/16/2013   HGB 12.8 06/16/2013   HCT 38.5 06/16/2013   PLT 260.0 06/16/2013   CHOL 203* 06/16/2013   TRIG 97.0 06/16/2013   HDL 49.80 06/16/2013   LDLDIRECT 137.2 06/16/2013   ALT 14 06/16/2013   AST 12 06/16/2013   NA 141 06/16/2013   K 4.1 06/16/2013   CL 105 06/16/2013   CREATININE 0.8 06/16/2013   BUN 13 06/16/2013   CO2 30 06/16/2013   TSH 1.26 06/16/2013   INR 0.8 03/13/2007       Assessment & Plan:

## 2013-10-27 LAB — IFOBT (OCCULT BLOOD): IMMUNOLOGICAL FECAL OCCULT BLOOD TEST: POSITIVE

## 2013-11-17 ENCOUNTER — Telehealth: Payer: Self-pay | Admitting: Internal Medicine

## 2013-11-17 NOTE — Telephone Encounter (Signed)
Left message for pt to call back  °

## 2013-11-18 NOTE — Telephone Encounter (Signed)
Pt scheduled to see Nicoletta Ba PA Monday at 10am. Pt aware of appt.

## 2013-11-20 ENCOUNTER — Encounter: Payer: Self-pay | Admitting: Internal Medicine

## 2013-11-23 ENCOUNTER — Encounter: Payer: Self-pay | Admitting: Physician Assistant

## 2013-11-23 ENCOUNTER — Ambulatory Visit (INDEPENDENT_AMBULATORY_CARE_PROVIDER_SITE_OTHER): Payer: 59 | Admitting: Physician Assistant

## 2013-11-23 VITALS — BP 110/80 | HR 80 | Ht 61.0 in | Wt 207.6 lb

## 2013-11-23 DIAGNOSIS — K921 Melena: Secondary | ICD-10-CM

## 2013-11-23 DIAGNOSIS — K5900 Constipation, unspecified: Secondary | ICD-10-CM

## 2013-11-23 MED ORDER — MOVIPREP 100 G PO SOLR: 1.0000 | ORAL | Status: DC

## 2013-11-23 MED ORDER — LUBIPROSTONE 24 MCG PO CAPS: 24.0000 ug | ORAL_CAPSULE | Freq: Two times a day (BID) | ORAL | Status: DC

## 2013-11-23 NOTE — Progress Notes (Signed)
Subjective:    Patient ID: [name], female    DOB: [birth year], 68 y.o.   MRN: [id number]  HPI  [name] is a pleasant 68 year old white female known to Dr. Scarlette Shorts who comes in today to 2 Hemoccult-positive stool. Patient relates that her insurance company did a home visit and sent off a stool specimen as part of that visit, and that returned positive. This was an IFOBT. - copy of the report is in her chart. Patient had undergone colonoscopy in February 2013 had one diminutive polyp removed if this is hyperplastic otherwise negative exam. 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 Patient says she has not seen blood herself but has terrible problems with constipation and does  lots of straining to the point of diaphoresis etc. in order to have a bowel movement. She is currently using MiraLax 2-3 times weekly ,probiotics ,and enemas when necessary. She says when she was taking MiraLax daily she would have pasty stools. She says most of the time she has no urge for bowel movement and again has to do a lot of straining etc. to evacuate her bowels. Family history is positive for colon cancer in a maternal aunt and a maternal uncle.    Review of Systems  Constitutional: Negative.   HENT: Negative.   Eyes: Negative.   Respiratory: Negative.   Cardiovascular: Negative.   Gastrointestinal: Positive for constipation and blood in stool.  Endocrine: Negative.   Genitourinary: Negative.   Musculoskeletal: Negative.   Allergic/Immunologic: Negative.   Neurological: Negative.   Hematological: Negative.   Psychiatric/Behavioral: Negative.    Outpatient Prescriptions Prior to Visit  Medication Sig Dispense Refill  . ALPRAZolam (XANAX) 0.5 MG tablet Take 1 tablet (0.5 mg total) by mouth 2 (two) times daily as needed.  180 tablet  0  . aspirin 81 MG tablet Take 81 mg by mouth daily.        Marland Kitchen  buPROPion (WELLBUTRIN SR) 150 MG 12 hr tablet Take 1 tablet by mouth  twice a day  180 tablet  1  . Cholecalciferol (VITAMIN D) 2000 UNITS CAPS Take 1 capsule by mouth daily.        . fish oil-omega-3 fatty acids 1000 MG capsule Take by mouth daily. Take  600 mg daily      . fluticasone (FLONASE) 50 MCG/ACT nasal spray Use 2 sprays in each  nostril daily  48 g  1  . gabapentin (NEURONTIN) 100 MG capsule Take 1 capsule by mouth at  bedtime and may repeat dose 1 time if needed  90 capsule  1  . loratadine (CLARITIN) 10 MG tablet Take 1 tablet (10 mg total) by mouth daily.  30 tablet  3  . lovastatin (MEVACOR) 40 MG tablet Take 1 tablet by mouth at  bedtime  90 tablet  1  . Multiple Vitamins-Minerals (OCUVITE PO) Take 2 tablets by mouth daily.      Marland Kitchen omeprazole (PRILOSEC) 40 MG capsule Take 1 capsule by mouth   twice daily before a meal.  180 capsule  1  . Probiotic Product (ALIGN) 4 MG CAPS Take 1 capsule (4 mg total) by mouth daily.  30 capsule  1  . solifenacin (VESICARE) 5 MG tablet Take 10 mg by mouth daily.        . verapamil (CALAN-SR) 240 MG CR tablet Take 1 tablet by mouth at  bedtime  90 tablet  1  . vitamin B-12 (CYANOCOBALAMIN) 1000 MCG tablet Take 500 mcg by mouth daily.       Marland Kitchen zolpidem (AMBIEN) 10 MG tablet Take 10 mg by mouth at bedtime as needed.        Marland Kitchen amoxicillin-clavulanate (AUGMENTIN) 875-125 MG per tablet Take 1 tablet by mouth 2 (two) times daily.  20 tablet  0  . azithromycin (ZITHROMAX) 250 MG tablet As directed  6 tablet  0  . CLIMARA 0.05 MG/24HR AS DIRECTED      . fluconazole (DIFLUCAN) 150 MG tablet Take 1 tablet (150 mg total) by mouth once.  1 tablet  1  . promethazine-codeine (PHENERGAN WITH CODEINE) 6.25-10 MG/5ML syrup Take 5 mLs by mouth every 4 (four) hours as needed for cough.  300 mL  0   No facility-administered medications prior to visit.   Allergies  Allergen Reactions  . Levaquin [Levofloxacin]     nausea   Patient Active Problem List   Diagnosis  Date Noted  . Cough 08/25/2013  . Well adult exam 06/16/2013  . Sinusitis, chronic 06/09/2013  . URI, acute 06/09/2012  . Restless legs syndrome (RLS) 01/26/2011  . FINGER PAIN 07/28/2010  . PALPITATIONS 10/18/2008  . VITAMIN D DEFICIENCY 10/09/2008  . DYSLIPIDEMIA 10/09/2008  . Morbid obesity 10/09/2008  . HYPERTENSION 10/09/2008  . SYNCOPE 09/27/2008  . UNSPECIFIED EUSTACHIAN TUBE DISORDER 04/09/2008  . ANXIETY 10/07/2007  . HEADACHE 10/07/2007  . INSOMNIA, PERSISTENT 07/08/2007  . DEPRESSION 07/08/2007  . RHINITIS 07/08/2007  . B12 DEFICIENCY 04/07/2007  . ANEMIA, DUE TO BLOOD LOSS 04/07/2007  . OSTEOARTHRITIS 04/07/2007  . GERD 04/04/2007  . DIVERTICULOSIS, COLON 04/04/2007   History  Substance Use Topics  . Smoking status: Never Smoker   . Smokeless tobacco: Never Used  . Alcohol Use: No   family history includes Arthritis in her mother; Colon cancer in her maternal aunt, maternal uncle, and other; Diabetes in her maternal aunt, maternal uncle, paternal uncle, and sister; Emphysema in her father; Heart disease in her father and mother; Hypertension in her mother; Kidney disease in her other. There is no history of Esophageal cancer or Liver disease.     Objective:   Physical Exam very nice well-developed older white female in no acute distress, blood pressure 110/80 pulse 80 height 5 foot 1 weight 207, BMI 39.2. HEENT ;nontraumatic normocephalic EOMI PERRLA sclera anicteric, Supple; no JVD, Cardiovascular; regular rate and rhythm with S1-S2 no murmur or gallop, Pulmonary; clear bilaterally, Abdomen ;soft nontender nondistended bowel sounds are active there is no palpable mass or hepatosplenomegaly, Rectal; exam not done patient was recently documented Hemoccult positive, EXT; no clubbing cyanosis or edema skin warm dry, Psych ;mood and affect appropriate        Assessment & Plan:  #58  68 year old female with recently documented Hemoccult-positive stool in the setting of  chronic constipation Patient had one hyperplastic polyp on colonoscopy February 2013. Will noted to rule out occult colon lesion. #2 GERD and history of esophagitis #3 obesity #4 hypertension  Plan; Long discussion with patient, she will be scheduled for colonoscopy with Dr. Henrene Pastor. Procedure discussed in detail and she is agreeable to proceed. Given chronic constipation she is asked to take 2 doses of MiraLax daily for 4 days prior to starting her prep Increased regular use of MiraLax to every other day Continue probiotic daily Liberal oral fluids At trial of Amitiza 8 mcg twice-daily-patient will try samples first and if she finds is beneficial  prescription has been sent

## 2013-11-23 NOTE — Progress Notes (Signed)
Discussed with physician assistant. Agree with plan as outlined

## 2013-11-23 NOTE — Patient Instructions (Addendum)
You have been scheduled for a colonoscopy with propofol. Please follow written instructions given to you at your visit today.  Please pick up your prep kit at the pharmacy within the next 1-3 days. [facility] [address]. If you use inhalers (even only as needed), please bring them with you on the day of your procedure. Your physician has requested that you go to www.startemmi.com and enter the access code given to you at your visit today. This web site gives a general overview about your procedure. However, you should still follow specific instructions given to you by our office regarding your preparation for the procedure. We have given you samples of Amitiza, take 1 cap twice daily with meals. We sent prescriptions to [facility] [address].  1. Amitiza 2. Moviprep- colonoscopy prep  Prior to preping for the colonoscopy, 3-4 days ahead of time, take 3 doses of Miralax each day.  7-4, 7-5, 7-6.

## 2013-12-09 ENCOUNTER — Encounter: Payer: Self-pay | Admitting: Internal Medicine

## 2013-12-23 ENCOUNTER — Encounter: Payer: Self-pay | Admitting: Internal Medicine

## 2013-12-23 ENCOUNTER — Ambulatory Visit (AMBULATORY_SURGERY_CENTER): Payer: 59 | Admitting: Internal Medicine

## 2013-12-23 VITALS — BP 166/95 | HR 82 | Temp 99.0°F | Resp 9 | Ht 61.0 in | Wt 207.0 lb

## 2013-12-23 DIAGNOSIS — K921 Melena: Secondary | ICD-10-CM

## 2013-12-23 DIAGNOSIS — D126 Benign neoplasm of colon, unspecified: Secondary | ICD-10-CM

## 2013-12-23 MED ORDER — SODIUM CHLORIDE 0.9 % IV SOLN: 500.0000 mL | INTRAVENOUS | Status: DC

## 2013-12-23 NOTE — Progress Notes (Signed)
Waiting for Dr. Perry. 

## 2013-12-23 NOTE — Op Note (Signed)
[facility]  [address] [place], [address]   COLONOSCOPY PROCEDURE REPORT  PATIENT: [name], [name]  MR#: [id number] BIRTHDATE: 1945/07/13 , 76  yrs. old GENDER: Female ENDOSCOPIST: Eustace Quail, MD REFERRED BY:.  [facility] PROCEDURE DATE:  12/23/2013 PROCEDURE:   Colonoscopy with snare polypectomy x 2 First Screening Colonoscopy - Avg.  risk and is 50 yrs.  old or older - No.  Prior Negative Screening - Now for repeat screening. N/A  History of Adenoma - Now for follow-up colonoscopy & has been > or = to 3 yrs.  N/A  Polyps Removed Today? Yes. ASA CLASS:   Class II INDICATIONS:heme-positive stool.(insurance exam at home).   last exam February 2013 with hyperplastic polyp MEDICATIONS: MAC sedation, administered by CRNA and propofol (Diprivan) 900mg  IV  DESCRIPTION OF PROCEDURE:   After the risks benefits and alternatives of the procedure were thoroughly explained, informed consent was obtained.  A digital rectal exam revealed no abnormalities of the rectum.   The [id number]  endoscope was introduced through the anus and advanced to the cecum, which was identified by the ileocecal valve. No adverse events experienced.   The quality of the prep was excellent, using MoviPrep  The instrument was then slowly withdrawn as the colon was fully examined.  COLON FINDINGS: The scope was advanced to the right colon. There was looping which prevented deep cecal intubation. This despite reintubation of the colon and moving the patient on all 4 sides. The majority of the cecum was however visualized. Slightly of of of obscurred by pills.Two diminutive polyps were found in the transverse colon and sigmoid colon.  A polypectomy was performed with a cold snare.  The resection was complete and the polyp tissue was completely retrieved.   The colon mucosa was otherwise normal. Retroflexed views revealed no abnormalities. The time to cecum=37 minutes 16 seconds.   Withdrawal time=7 minutes 28 seconds.  The scope was withdrawn and the procedure completed. COMPLICATIONS: There were no complications.  ENDOSCOPIC IMPRESSION: 1.   Two diminutive polyps were found in the transverse and sigmoid colon; polypectomy was performed with a cold snare 2.   The colon mucosa was otherwise normal (tip of cecum not seen, but visualized in 2013)  RECOMMENDATIONS: 1. Follow up colonoscopy in 5 years   eSigned:  Eustace Quail, MD 12/23/2013 3:34 PM   cc: The Patient and Altamese Pisgah.  Plotnikov, MD

## 2013-12-23 NOTE — Patient Instructions (Signed)
Colon polyps x 2 removed today, see handout given on polyps. Repeat colonoscopy in 5 years.  Call us with any questions or concerns. Thank you!  YOU HAD AN ENDOSCOPIC PROCEDURE TODAY AT [facility]: Refer to the procedure report that was given to you for any specific questions about what was found during the examination.  If the procedure report does not answer your questions, please call your gastroenterologist to clarify.  If you requested that your care partner not be given the details of your procedure findings, then the procedure report has been included in a sealed envelope for you to review at your convenience later.  YOU SHOULD EXPECT: Some feelings of bloating in the abdomen. Passage of more gas than usual.  Walking can help get rid of the air that was put into your GI tract during the procedure and reduce the bloating. If you had a lower endoscopy (such as a colonoscopy or flexible sigmoidoscopy) you may notice spotting of blood in your stool or on the toilet paper. If you underwent a bowel prep for your procedure, then you may not have a normal bowel movement for a few days.  DIET: Your first meal following the procedure should be a light meal and then it is ok to progress to your normal diet.  A half-sandwich or bowl of soup is an example of a good first meal.  Heavy or fried foods are harder to digest and may make you feel nauseous or bloated.  Likewise meals heavy in dairy and vegetables can cause extra gas to form and this can also increase the bloating.  Drink plenty of fluids but you should avoid alcoholic beverages for 24 hours.  ACTIVITY: Your care partner should take you home directly after the procedure.  You should plan to take it easy, moving slowly for the rest of the day.  You can resume normal activity the day after the procedure however you should NOT DRIVE or use heavy machinery for 24 hours (because of the sedation medicines used during the test).    SYMPTOMS  TO REPORT IMMEDIATELY: A gastroenterologist can be reached at any hour.  During normal business hours, 8:30 AM to 5:00 PM Monday through Friday, call [phone].  After hours and on weekends, please call the GI answering service at [phone] who will take a message and have the physician on call contact you.   Following lower endoscopy (colonoscopy or flexible sigmoidoscopy):  Excessive amounts of blood in the stool  Significant tenderness or worsening of abdominal pains  Swelling of the abdomen that is new, acute  Fever of 100F or higher  Following upper endoscopy (EGD)  Vomiting of blood or coffee ground material  New chest pain or pain under the shoulder blades  Painful or persistently difficult swallowing  New shortness of breath  Fever of 100F or higher  Black, tarry-looking stools  FOLLOW UP: If any biopsies were taken you will be contacted by phone or by letter within the next 1-3 weeks.  Call your gastroenterologist if you have not heard about the biopsies in 3 weeks.  Our staff will call the home number listed on your records the next business day following your procedure to check on you and address any questions or concerns that you may have at that time regarding the information given to you following your procedure. This is a courtesy call and so if there is no answer at the home number and we have not heard from  you through the emergency physician on call, we will assume that you have returned to your regular daily activities without incident.  SIGNATURES/CONFIDENTIALITY: You and/or your care partner have signed paperwork which will be entered into your electronic medical record.  These signatures attest to the fact that that the information above on your After Visit Summary has been reviewed and is understood.  Full responsibility of the confidentiality of this discharge information lies with you and/or your care-partner.

## 2013-12-23 NOTE — Progress Notes (Signed)
A/ox3, pleased with MAC, report to RN 

## 2013-12-23 NOTE — Progress Notes (Signed)
Called to room to assist during endoscopic procedure.  Patient ID and intended procedure confirmed with present staff. Received instructions for my participation in the procedure from the performing physician.  

## 2013-12-24 ENCOUNTER — Telehealth: Payer: Self-pay | Admitting: *Deleted

## 2013-12-24 NOTE — Telephone Encounter (Signed)
  Follow up Call-  Call back number 12/23/2013 07/31/2011  Post procedure Call Back phone  # [phone] cell [phone]   Permission to leave phone message Yes Yes     Patient questions:  Do you have a fever, pain , or abdominal swelling? Yes.   Pain Score  2 *  Have you tolerated food without any problems? Yes.    Have you been able to return to your normal activities? Yes.    Do you have any questions about your discharge instructions: Diet   No. Medications  No. Follow up visit  No.  Do you have questions or concerns about your Care? Yes.    Actions: * If pain score is 4 or above: No action needed, pain <4.

## 2013-12-29 ENCOUNTER — Encounter: Payer: Self-pay | Admitting: Internal Medicine

## 2014-01-31 ENCOUNTER — Encounter: Payer: Self-pay | Admitting: Internal Medicine

## 2014-02-08 ENCOUNTER — Other Ambulatory Visit: Payer: Self-pay | Admitting: Internal Medicine

## 2014-02-26 ENCOUNTER — Ambulatory Visit (INDEPENDENT_AMBULATORY_CARE_PROVIDER_SITE_OTHER): Payer: Medicare Other | Admitting: Internal Medicine

## 2014-02-26 ENCOUNTER — Encounter: Payer: Self-pay | Admitting: Internal Medicine

## 2014-02-26 VITALS — BP 138/90 | HR 80 | Temp 97.4°F | Wt 203.5 lb

## 2014-02-26 DIAGNOSIS — R05 Cough: Secondary | ICD-10-CM

## 2014-02-26 DIAGNOSIS — J320 Chronic maxillary sinusitis: Secondary | ICD-10-CM

## 2014-02-26 DIAGNOSIS — R059 Cough, unspecified: Secondary | ICD-10-CM

## 2014-02-26 DIAGNOSIS — Z23 Encounter for immunization: Secondary | ICD-10-CM

## 2014-02-26 MED ORDER — PROMETHAZINE-CODEINE 6.25-10 MG/5ML PO SYRP: 5.0000 mL | ORAL_SOLUTION | ORAL | Status: DC | PRN

## 2014-02-26 MED ORDER — AMOXICILLIN-POT CLAVULANATE 875-125 MG PO TABS: 1.0000 | ORAL_TABLET | Freq: Two times a day (BID) | ORAL | Status: DC

## 2014-02-26 NOTE — Assessment & Plan Note (Signed)
Prom-cod 

## 2014-02-26 NOTE — Assessment & Plan Note (Signed)
PO abx Prom-cod

## 2014-02-26 NOTE — Progress Notes (Signed)
Patient ID: [name], female   DOB: 11-04-1945, 68 y.o.   MRN: [id number] Patient ID: [name], female   DOB: 18-Mar-1946, 68 y.o.   MRN: [id number]  Subjective:    Patient ID: [name], female    DOB: 02-Sep-1945, 68 y.o.   MRN: [id number]  Sinusitis This is a new problem. The current episode started in the past 7 days. Associated symptoms include congestion and coughing. Pertinent negatives include no chills, headaches or sinus pressure. Past treatments include acetaminophen. The treatment provided mild relief.  Cough Associated symptoms include rhinorrhea. Pertinent negatives include no chills, headaches or rash.   The patient presents for a follow-up of  chronic hypertension, chronic dyslipidemia, B12 def controlled with medicines    Review of Systems  Constitutional: Negative for chills, activity change, appetite change, fatigue and unexpected weight change.  HENT: Positive for congestion and rhinorrhea. Negative for mouth sores and sinus pressure.   Eyes: Negative for visual disturbance.  Respiratory: Positive for cough. Negative for chest tightness.   Gastrointestinal: Negative for nausea and abdominal pain.  Genitourinary: Negative for frequency, difficulty urinating and vaginal pain.  Musculoskeletal: Negative for back pain and gait problem.  Skin: Negative for pallor and rash.  Neurological: Negative for dizziness, tremors, weakness, numbness and headaches.  Psychiatric/Behavioral: Negative for confusion and sleep disturbance.       Objective:   Physical Exam  Constitutional: She appears well-developed. No distress.  Obese  HENT:  Head: Normocephalic.  Right Ear: External ear normal.  Left Ear: External ear normal.  Nose: Nose normal.  Mouth/Throat: Oropharynx is clear and moist.  eryth throt  Eyes: Conjunctivae are normal. Pupils are equal, round, and reactive to light. Right eye exhibits no discharge. Left eye exhibits no discharge.  Neck: Normal range of  motion. Neck supple. No JVD present. No tracheal deviation present. No thyromegaly present.  Cardiovascular: Normal rate, regular rhythm and normal heart sounds.   Pulmonary/Chest: No stridor. No respiratory distress. She has no wheezes.  Abdominal: Soft. Bowel sounds are normal. She exhibits no distension and no mass. There is no tenderness. There is no rebound and no guarding.  Musculoskeletal: She exhibits no edema and no tenderness.  Lymphadenopathy:    She has no cervical adenopathy.  Neurological: She displays normal reflexes. No cranial nerve deficit. She exhibits normal muscle tone. Coordination normal.  Skin: No rash noted. No erythema.  Psychiatric: She has a normal mood and affect. Her behavior is normal. Judgment and thought content normal.      Lab Results  Component Value Date   WBC 8.7 06/16/2013   HGB 12.8 06/16/2013   HCT 38.5 06/16/2013   PLT 260.0 06/16/2013   CHOL 203* 06/16/2013   TRIG 97.0 06/16/2013   HDL 49.80 06/16/2013   LDLDIRECT 137.2 06/16/2013   ALT 14 06/16/2013   AST 12 06/16/2013   NA 141 06/16/2013   K 4.1 06/16/2013   CL 105 06/16/2013   CREATININE 0.8 06/16/2013   BUN 13 06/16/2013   CO2 30 06/16/2013   TSH 1.26 06/16/2013   INR 0.8 03/13/2007       Assessment & Plan:

## 2014-02-26 NOTE — Progress Notes (Deleted)
Pre visit review using our clinic review tool, if applicable. No additional management support is needed unless otherwise documented below in the visit note. 

## 2014-03-13 ENCOUNTER — Other Ambulatory Visit: Payer: Self-pay | Admitting: Physician Assistant

## 2014-04-05 ENCOUNTER — Telehealth: Payer: Self-pay | Admitting: Internal Medicine

## 2014-04-05 NOTE — Telephone Encounter (Signed)
Patient has a uti and would like to know if something could be sent into her pharmacy? Or do she need to come and give a urine sample? Please advise

## 2014-04-05 NOTE — Telephone Encounter (Signed)
Schedule OV with any MD as PCP is out of office all week. Thanks!

## 2014-04-07 ENCOUNTER — Encounter: Payer: Self-pay | Admitting: Family

## 2014-04-07 ENCOUNTER — Ambulatory Visit (INDEPENDENT_AMBULATORY_CARE_PROVIDER_SITE_OTHER): Payer: Medicare Other | Admitting: Family

## 2014-04-07 ENCOUNTER — Other Ambulatory Visit: Payer: Medicare Other

## 2014-04-07 VITALS — BP 138/80 | HR 80 | Temp 97.9°F | Resp 18 | Ht 60.0 in | Wt 205.1 lb

## 2014-04-07 DIAGNOSIS — R300 Dysuria: Secondary | ICD-10-CM

## 2014-04-07 DIAGNOSIS — R350 Frequency of micturition: Secondary | ICD-10-CM

## 2014-04-07 LAB — POCT URINALYSIS DIPSTICK
Bilirubin, UA: NEGATIVE
Blood, UA: NEGATIVE
GLUCOSE UA: NEGATIVE
Ketones, UA: NEGATIVE
NITRITE UA: NEGATIVE
PROTEIN UA: NEGATIVE
Spec Grav, UA: 1.02
UROBILINOGEN UA: 0.2
pH, UA: 6.5

## 2014-04-07 MED ORDER — SULFAMETHOXAZOLE-TRIMETHOPRIM 800-160 MG PO TABS: 1.0000 | ORAL_TABLET | Freq: Two times a day (BID) | ORAL | Status: DC

## 2014-04-07 NOTE — Progress Notes (Signed)
Pre visit review using our clinic review tool, if applicable. No additional management support is needed unless otherwise documented below in the visit note. 

## 2014-04-07 NOTE — Assessment & Plan Note (Signed)
Symptoms consistent with UTI. POCT urine tested positive for leukocytes. Start Bactrim x 3 days. Urine sent for culture. Instructed to drink plenty of fluids. Follow up if symptoms worsen or fail to improve.

## 2014-04-07 NOTE — Progress Notes (Signed)
Subjective:    Patient ID: [name], female    DOB: 05-09-1946, 68 y.o.   MRN: [id number]  Chief Complaint  Patient presents with  . Urinary Tract Infection    dyuria and urinary frequency x1 week    HPI:  [name] is a 68 y.o. female who presents today for UTI related symptoms.   Symptoms started about a week ago. Has been experiencing dysuria and frequency. Has used AZO which provided some relief, but did not resolve the problem.  Has had some back pain, but denies fevers.   Allergies  Allergen Reactions  . Levaquin [Levofloxacin]     nausea   Current Outpatient Prescriptions on File Prior to Visit  Medication Sig Dispense Refill  . ALPRAZolam (XANAX) 0.5 MG tablet Take 1 tablet (0.5 mg total) by mouth 2 (two) times daily as needed.  180 tablet  0  . AMITIZA 24 MCG capsule TAKE 1 CAPSULE (24 MCG TOTAL) BY MOUTH 2 (TWO) TIMES DAILY WITH A MEAL.  60 capsule  3  . aspirin 81 MG tablet Take 81 mg by mouth daily.        Marland Kitchen buPROPion (WELLBUTRIN SR) 150 MG 12 hr tablet Take 1 tablet by mouth  twice a day  180 tablet  1  . Cholecalciferol (VITAMIN D) 2000 UNITS CAPS Take 1 capsule by mouth daily.        . fish oil-omega-3 fatty acids 1000 MG capsule Take by mouth daily. Take  600 mg daily      . fluticasone (FLONASE) 50 MCG/ACT nasal spray Use 2 sprays in each  nostril daily  48 g  1  . gabapentin (NEURONTIN) 100 MG capsule Take 1 capsule by mouth at  bedtime and may repeat dose 1 time if needed  180 capsule  2  . loratadine (CLARITIN) 10 MG tablet Take 1 tablet (10 mg total) by mouth daily.  30 tablet  3  . lovastatin (MEVACOR) 40 MG tablet Take 1 tablet by mouth at  bedtime  90 tablet  2  . Multiple Vitamins-Minerals (OCUVITE PO) Take 2 tablets by mouth daily.      Marland Kitchen omeprazole (PRILOSEC) 40 MG capsule Take 1 capsule by mouth    twice daily before a meal.  180 capsule  2  . polyethylene glycol powder (MIRALAX) powder Take 1 capful 2-3 times per week      . Probiotic Product  (ALIGN) 4 MG CAPS Take 1 capsule (4 mg total) by mouth daily.  30 capsule  1  . promethazine-codeine (PHENERGAN WITH CODEINE) 6.25-10 MG/5ML syrup Take 5 mLs by mouth every 4 (four) hours as needed.  300 mL  0  . solifenacin (VESICARE) 5 MG tablet Take 10 mg by mouth daily.        . verapamil (CALAN-SR) 240 MG CR tablet Take 1 tablet by mouth at  bedtime  90 tablet  2  . vitamin B-12 (CYANOCOBALAMIN) 1000 MCG tablet Take 500 mcg by mouth daily.       Marland Kitchen zolpidem (AMBIEN) 10 MG tablet Take 10 mg by mouth at bedtime as needed.        Marland Kitchen amoxicillin-clavulanate (AUGMENTIN) 875-125 MG per tablet Take 1 tablet by mouth 2 (two) times daily.  20 tablet  0   No current facility-administered medications on file prior to visit.    Review of Systems  See HPI     Objective:    BP 138/80  Pulse 80  Temp(Src)  97.9 F (36.6 C) (Oral)  Resp 18  Ht 5' (1.524 m)  Wt 205 lb 1.9 oz (93.042 kg)  BMI 40.06 kg/m2  SpO2 92% Nursing note and vital signs reviewed.  Physical Exam  Constitutional: She is oriented to person, place, and time. She appears well-developed and well-nourished. No distress.  Cardiovascular: Normal rate, regular rhythm and normal heart sounds.   Pulmonary/Chest: Effort normal and breath sounds normal.  Abdominal: There is no CVA tenderness.  Neurological: She is alert and oriented to person, place, and time.  Skin: Skin is warm and dry.  Psychiatric: She has a normal mood and affect. Her behavior is normal. Judgment and thought content normal.       Assessment & Plan:

## 2014-04-07 NOTE — Patient Instructions (Signed)
Thank you for choosing [facility].  Summary/Instructions:   You have a urinary tract infection  Your prescription has been sent to your pharmacy  Drink plenty of fluids, may include cranberry juice.  Urinary Tract Infection Urinary tract infections (UTIs) can develop anywhere along your urinary tract. Your urinary tract is your body's drainage system for removing wastes and extra water. Your urinary tract includes two kidneys, two ureters, a bladder, and a urethra. Your kidneys are a pair of bean-shaped organs. Each kidney is about the size of your fist. They are located below your ribs, one on each side of your spine. CAUSES Infections are caused by microbes, which are microscopic organisms, including fungi, viruses, and bacteria. These organisms are so small that they can only be seen through a microscope. Bacteria are the microbes that most commonly cause UTIs. SYMPTOMS  Symptoms of UTIs may vary by age and gender of the patient and by the location of the infection. Symptoms in young women typically include a frequent and intense urge to urinate and a painful, burning feeling in the bladder or urethra during urination. Older women and men are more likely to be tired, shaky, and weak and have muscle aches and abdominal pain. A fever may mean the infection is in your kidneys. Other symptoms of a kidney infection include pain in your back or sides below the ribs, nausea, and vomiting. DIAGNOSIS To diagnose a UTI, your caregiver will ask you about your symptoms. Your caregiver also will ask to provide a urine sample. The urine sample will be tested for bacteria and white blood cells. White blood cells are made by your body to help fight infection. TREATMENT  Typically, UTIs can be treated with medication. Because most UTIs are caused by a bacterial infection, they usually can be treated with the use of antibiotics. The choice of antibiotic and length of treatment depend on your symptoms  and the type of bacteria causing your infection. HOME CARE INSTRUCTIONS  If you were prescribed antibiotics, take them exactly as your caregiver instructs you. Finish the medication even if you feel better after you have only taken some of the medication.  Drink enough water and fluids to keep your urine clear or pale yellow.  Avoid caffeine, tea, and carbonated beverages. They tend to irritate your bladder.  Empty your bladder often. Avoid holding urine for long periods of time.  Empty your bladder before and after sexual intercourse.  After a bowel movement, women should cleanse from front to back. Use each tissue only once. SEEK MEDICAL CARE IF:   You have back pain.  You develop a fever.  Your symptoms do not begin to resolve within 3 days. SEEK IMMEDIATE MEDICAL CARE IF:   You have severe back pain or lower abdominal pain.  You develop chills.  You have nausea or vomiting.  You have continued burning or discomfort with urination. MAKE SURE YOU:   Understand these instructions.  Will watch your condition.  Will get help right away if you are not doing well or get worse. Document Released: 03/14/2005 Document Revised: 12/04/2011 Document Reviewed: 07/13/2011 [facility] Patient Information 2015 [place], [place]. This information is not intended to replace advice given to you by your health care provider. Make sure you discuss any questions you have with your health care provider.

## 2014-04-10 LAB — URINE CULTURE

## 2014-06-29 ENCOUNTER — Telehealth: Payer: Self-pay | Admitting: *Deleted

## 2014-06-29 MED ORDER — LUBIPROSTONE 24 MCG PO CAPS: 24.0000 ug | ORAL_CAPSULE | Freq: Two times a day (BID) | ORAL | Status: DC

## 2014-06-29 NOTE — Telephone Encounter (Signed)
We received a fax from [facility], sent refills for twice daily Amitiza for this patient.

## 2014-07-02 ENCOUNTER — Other Ambulatory Visit: Payer: Self-pay

## 2014-07-02 DIAGNOSIS — Z1231 Encounter for screening mammogram for malignant neoplasm of breast: Secondary | ICD-10-CM

## 2014-07-03 ENCOUNTER — Other Ambulatory Visit: Payer: Self-pay | Admitting: Physician Assistant

## 2014-07-12 ENCOUNTER — Other Ambulatory Visit: Payer: Self-pay | Admitting: Dermatology

## 2014-07-15 ENCOUNTER — Ambulatory Visit
Admission: RE | Admit: 2014-07-15 | Discharge: 2014-07-15 | Disposition: A | Discharge status: Home / Self Care | Payer: Medicare Other | Source: Ambulatory Visit

## 2014-07-15 DIAGNOSIS — Z1231 Encounter for screening mammogram for malignant neoplasm of breast: Secondary | ICD-10-CM

## 2014-07-28 ENCOUNTER — Other Ambulatory Visit: Payer: Self-pay | Admitting: Internal Medicine

## 2014-08-02 ENCOUNTER — Other Ambulatory Visit: Payer: Self-pay | Admitting: Physician Assistant

## 2014-08-24 ENCOUNTER — Ambulatory Visit (INDEPENDENT_AMBULATORY_CARE_PROVIDER_SITE_OTHER): Payer: Medicare Other | Admitting: Ophthalmology

## 2014-08-24 DIAGNOSIS — H43813 Vitreous degeneration, bilateral: Secondary | ICD-10-CM | POA: Diagnosis not present

## 2014-08-24 DIAGNOSIS — H35033 Hypertensive retinopathy, bilateral: Secondary | ICD-10-CM

## 2014-08-24 DIAGNOSIS — I10 Essential (primary) hypertension: Secondary | ICD-10-CM | POA: Diagnosis not present

## 2014-08-24 DIAGNOSIS — H3531 Nonexudative age-related macular degeneration: Secondary | ICD-10-CM | POA: Diagnosis not present

## 2014-09-16 ENCOUNTER — Ambulatory Visit: Payer: Medicare Other | Admitting: Internal Medicine

## 2014-09-22 ENCOUNTER — Ambulatory Visit (INDEPENDENT_AMBULATORY_CARE_PROVIDER_SITE_OTHER): Payer: Medicare Other | Admitting: Internal Medicine

## 2014-09-22 ENCOUNTER — Encounter: Payer: Self-pay | Admitting: Internal Medicine

## 2014-09-22 VITALS — BP 150/84 | HR 82 | Temp 98.0°F | Resp 16 | Ht 60.0 in | Wt 195.0 lb

## 2014-09-22 DIAGNOSIS — G4700 Insomnia, unspecified: Secondary | ICD-10-CM

## 2014-09-22 DIAGNOSIS — F068 Other specified mental disorders due to known physiological condition: Secondary | ICD-10-CM

## 2014-09-22 DIAGNOSIS — F09 Unspecified mental disorder due to known physiological condition: Secondary | ICD-10-CM | POA: Insufficient documentation

## 2014-09-22 MED ORDER — PHOSPHATIDYLSERINE-DHA-EPA 100-19.5-6.5 MG PO CAPS: ORAL_CAPSULE | ORAL | Status: DC

## 2014-09-22 MED ORDER — ALPRAZOLAM 0.5 MG PO TABS: 0.5000 mg | ORAL_TABLET | Freq: Two times a day (BID) | ORAL | Status: DC | PRN

## 2014-09-22 NOTE — Progress Notes (Signed)
Pre visit review using our clinic review tool, if applicable. No additional management support is needed unless otherwise documented below in the visit note. 

## 2014-09-22 NOTE — Progress Notes (Signed)
   Subjective:    Patient ID: [name], female    DOB: Feb 11, 1946, 69 y.o.   MRN: [id number]  HPI   C/o hearing loss out of R ear x 1 mo C/o memory issues  The patient presents for a follow-up of  chronic hypertension, chronic dyslipidemia, B12 def controlled with medicines  BP Readings from Last 3 Encounters:  09/22/14 150/84  04/07/14 138/80  02/26/14 138/90   Wt Readings from Last 3 Encounters:  09/22/14 195 lb (88.451 kg)  04/07/14 205 lb 1.9 oz (93.042 kg)  02/26/14 203 lb 8 oz (92.307 kg)      Review of Systems  Constitutional: Negative for activity change, appetite change, fatigue and unexpected weight change.  HENT: Negative for mouth sores.   Eyes: Negative for visual disturbance.  Respiratory: Negative for chest tightness.   Gastrointestinal: Negative for nausea and abdominal pain.  Genitourinary: Negative for frequency, difficulty urinating and vaginal pain.  Musculoskeletal: Negative for back pain and gait problem.  Skin: Negative for pallor.  Neurological: Negative for dizziness, tremors, weakness and numbness.  Psychiatric/Behavioral: Negative for confusion and sleep disturbance.       Objective:   Physical Exam  Constitutional: She appears well-developed. No distress.  Obese  HENT:  Head: Normocephalic.  Right Ear: External ear normal.  Left Ear: External ear normal.  Nose: Nose normal.  Mouth/Throat: Oropharynx is clear and moist.  eryth throt  Eyes: Conjunctivae are normal. Pupils are equal, round, and reactive to light. Right eye exhibits no discharge. Left eye exhibits no discharge.  Neck: Normal range of motion. Neck supple. No JVD present. No tracheal deviation present. No thyromegaly present.  Cardiovascular: Normal rate, regular rhythm and normal heart sounds.   Pulmonary/Chest: No stridor. No respiratory distress. She has no wheezes.  Abdominal: Soft. Bowel sounds are normal. She exhibits no distension and no mass. There is no  tenderness. There is no rebound and no guarding.  Musculoskeletal: She exhibits no edema or tenderness.  Lymphadenopathy:    She has no cervical adenopathy.  Neurological: She displays normal reflexes. No cranial nerve deficit. She exhibits normal muscle tone. Coordination normal.  Skin: No rash noted. No erythema.  Psychiatric: She has a normal mood and affect. Her behavior is normal. Judgment and thought content normal.   Wax R ear removed w/a loop Recalls 3/3 Serial 7s wnl A/o/c   Lab Results  Component Value Date   WBC 8.7 06/16/2013   HGB 12.8 06/16/2013   HCT 38.5 06/16/2013   PLT 260.0 06/16/2013   CHOL 203* 06/16/2013   TRIG 97.0 06/16/2013   HDL 49.80 06/16/2013   LDLDIRECT 137.2 06/16/2013   ALT 14 06/16/2013   AST 12 06/16/2013   NA 141 06/16/2013   K 4.1 06/16/2013   CL 105 06/16/2013   CREATININE 0.8 06/16/2013   BUN 13 06/16/2013   CO2 30 06/16/2013   TSH 1.26 06/16/2013   INR 0.8 03/13/2007       Assessment & Plan:

## 2014-09-22 NOTE — Assessment & Plan Note (Signed)
Better  

## 2014-09-22 NOTE — Assessment & Plan Note (Signed)
4/16 Vayacog qd  Treat insomnia

## 2014-09-22 NOTE — Assessment & Plan Note (Signed)
Chronic Trazodone

## 2014-10-04 ENCOUNTER — Other Ambulatory Visit: Payer: Self-pay | Admitting: Physician Assistant

## 2014-12-13 ENCOUNTER — Other Ambulatory Visit: Payer: Self-pay

## 2014-12-24 ENCOUNTER — Ambulatory Visit (INDEPENDENT_AMBULATORY_CARE_PROVIDER_SITE_OTHER): Payer: Medicare Other | Admitting: Internal Medicine

## 2014-12-24 ENCOUNTER — Encounter: Payer: Self-pay | Admitting: Internal Medicine

## 2014-12-24 VITALS — BP 122/88 | HR 88 | Ht 60.0 in | Wt 194.0 lb

## 2014-12-24 DIAGNOSIS — R519 Headache, unspecified: Secondary | ICD-10-CM | POA: Insufficient documentation

## 2014-12-24 DIAGNOSIS — F09 Unspecified mental disorder due to known physiological condition: Secondary | ICD-10-CM

## 2014-12-24 DIAGNOSIS — G4489 Other headache syndrome: Secondary | ICD-10-CM

## 2014-12-24 DIAGNOSIS — F068 Other specified mental disorders due to known physiological condition: Secondary | ICD-10-CM

## 2014-12-24 DIAGNOSIS — I10 Essential (primary) hypertension: Secondary | ICD-10-CM

## 2014-12-24 DIAGNOSIS — R51 Headache: Secondary | ICD-10-CM

## 2014-12-24 DIAGNOSIS — G4700 Insomnia, unspecified: Secondary | ICD-10-CM

## 2014-12-24 DIAGNOSIS — E538 Deficiency of other specified B group vitamins: Secondary | ICD-10-CM | POA: Diagnosis not present

## 2014-12-24 DIAGNOSIS — F4323 Adjustment disorder with mixed anxiety and depressed mood: Secondary | ICD-10-CM

## 2014-12-24 DIAGNOSIS — D485 Neoplasm of uncertain behavior of skin: Secondary | ICD-10-CM

## 2014-12-24 MED ORDER — BUTALBITAL-APAP-CAFF-COD 50-325-40-30 MG PO CAPS: 1.0000 | ORAL_CAPSULE | Freq: Four times a day (QID) | ORAL | Status: DC | PRN

## 2014-12-24 NOTE — Assessment & Plan Note (Addendum)
Sporadic Butalbit/caff/cod prn

## 2014-12-24 NOTE — Assessment & Plan Note (Signed)
On Wellbutrin 

## 2014-12-24 NOTE — Assessment & Plan Note (Signed)
mole on R face Skin bx

## 2014-12-24 NOTE — Assessment & Plan Note (Signed)
Vayacog qd - too $$  Treat insomnia

## 2014-12-24 NOTE — Assessment & Plan Note (Signed)
On B12 

## 2014-12-24 NOTE — Progress Notes (Signed)
Pre visit review using our clinic review tool, if applicable. No additional management support is needed unless otherwise documented below in the visit note. 

## 2014-12-24 NOTE — Progress Notes (Signed)
Subjective:  Patient ID: [name], female    DOB: 1946-05-09  Age: 69 y.o. MRN: [id number]  CC: No chief complaint on file.   HPI [name] presents for anxiety, memory loss  Outpatient Prescriptions Prior to Visit  Medication Sig Dispense Refill  . ALPRAZolam (XANAX) 0.5 MG tablet Take 1 tablet (0.5 mg total) by mouth 2 (two) times daily as needed. 60 tablet 2  . aspirin 81 MG tablet Take 81 mg by mouth daily.      Marland Kitchen buPROPion (WELLBUTRIN SR) 150 MG 12 hr tablet Take 1 tablet by mouth  twice a day 180 tablet 2  . Cholecalciferol (VITAMIN D) 2000 UNITS CAPS Take 1 capsule by mouth daily.      . fish oil-omega-3 fatty acids 1000 MG capsule Take by mouth daily. Take  600 mg daily    . fluticasone (FLONASE) 50 MCG/ACT nasal spray Use 2 sprays in each  nostril daily 48 g 2  . gabapentin (NEURONTIN) 100 MG capsule Take 1 capsule by mouth at  bedtime and may repeat dose 1 time if needed 180 capsule 2  . loratadine (CLARITIN) 10 MG tablet Take 1 tablet (10 mg total) by mouth daily. 30 tablet 3  . lovastatin (MEVACOR) 40 MG tablet Take 1 tablet by mouth at  bedtime 90 tablet 2  . lubiprostone (AMITIZA) 24 MCG capsule Take 1 cap by mouth 2 times daily with a meal 60 capsule 3  . Multiple Vitamins-Minerals (OCUVITE PO) Take 2 tablets by mouth daily.    Marland Kitchen omeprazole (PRILOSEC) 40 MG capsule Take 1 capsule by mouth  twice daily before a meal 180 capsule 2  . Phosphatidylserine-DHA-EPA (VAYACOG) 100-19.5-6.5 MG CAPS 1 po qd 30 capsule 11  . Probiotic Product (ALIGN) 4 MG CAPS Take 1 capsule (4 mg total) by mouth daily. 30 capsule 1  . solifenacin (VESICARE) 5 MG tablet Take 10 mg by mouth daily.      . verapamil (CALAN-SR) 240 MG CR tablet Take 1 tablet by mouth at  bedtime 90 tablet 2  . vitamin B-12 (CYANOCOBALAMIN) 1000 MCG tablet Take 500 mcg by mouth daily.     Marland Kitchen lubiprostone (AMITIZA) 24 MCG capsule Take 1 capsule (24 mcg total) by mouth 2 (two) times daily with a meal. 60 capsule 3    . lubiprostone (AMITIZA) 24 MCG capsule Take 1 capsule (24 mcg total) by mouth 2 (two) times daily with a meal. 180 capsule 3  . zolpidem (AMBIEN) 10 MG tablet Take 10 mg by mouth at bedtime as needed.       No facility-administered medications prior to visit.    ROS Review of Systems  Constitutional: Negative for chills, activity change, appetite change, fatigue and unexpected weight change.  HENT: Negative for congestion, mouth sores and sinus pressure.   Eyes: Negative for visual disturbance.  Respiratory: Negative for cough and chest tightness.   Gastrointestinal: Negative for nausea and abdominal pain.  Genitourinary: Negative for frequency, difficulty urinating and vaginal pain.  Musculoskeletal: Positive for arthralgias. Negative for back pain and gait problem.  Skin: Negative for pallor and rash.  Neurological: Negative for dizziness, tremors, weakness, numbness and headaches.  Psychiatric/Behavioral: Negative for suicidal ideas, confusion and sleep disturbance. The patient is nervous/anxious.     Objective:  BP 122/88 mmHg  Pulse 88  Ht 5' (1.524 m)  Wt 194 lb (87.998 kg)  BMI 37.89 kg/m2  SpO2 98%  BP Readings from Last 3 Encounters:  12/24/14  122/88  09/22/14 150/84  04/07/14 138/80    Wt Readings from Last 3 Encounters:  12/24/14 194 lb (87.998 kg)  09/22/14 195 lb (88.451 kg)  04/07/14 205 lb 1.9 oz (93.042 kg)    Physical Exam  Constitutional: She appears well-developed. No distress.  Obese  HENT:  Head: Normocephalic.  Right Ear: External ear normal.  Left Ear: External ear normal.  Nose: Nose normal.  Mouth/Throat: Oropharynx is clear and moist.  Eyes: Conjunctivae are normal. Pupils are equal, round, and reactive to light. Right eye exhibits no discharge. Left eye exhibits no discharge.  Neck: Normal range of motion. Neck supple. No JVD present. No tracheal deviation present. No thyromegaly present.  Cardiovascular: Normal rate, regular rhythm and  normal heart sounds.   Pulmonary/Chest: No stridor. No respiratory distress. She has no wheezes.  Abdominal: Soft. Bowel sounds are normal. She exhibits no distension and no mass. There is no tenderness. There is no rebound and no guarding.  Musculoskeletal: She exhibits no edema or tenderness.  Lymphadenopathy:    She has no cervical adenopathy.  Neurological: She displays normal reflexes. No cranial nerve deficit. She exhibits normal muscle tone. Coordination normal.  Skin: No rash noted. No erythema.  Psychiatric: She has a normal mood and affect. Her behavior is normal. Judgment and thought content normal.  mole on R face  Lab Results  Component Value Date   WBC 8.7 06/16/2013   HGB 12.8 06/16/2013   HCT 38.5 06/16/2013   PLT 260.0 06/16/2013   GLUCOSE 84 06/16/2013   CHOL 203* 06/16/2013   TRIG 97.0 06/16/2013   HDL 49.80 06/16/2013   LDLDIRECT 137.2 06/16/2013   LDLCALC 120* 07/25/2010   ALT 14 06/16/2013   AST 12 06/16/2013   NA 141 06/16/2013   K 4.1 06/16/2013   CL 105 06/16/2013   CREATININE 0.8 06/16/2013   BUN 13 06/16/2013   CO2 30 06/16/2013   TSH 1.26 06/16/2013   INR 0.8 03/13/2007    Mm Screening Breast Tomo Bilateral  07/15/2014   CLINICAL DATA:  Screening.  EXAM: DIGITAL SCREENING BILATERAL MAMMOGRAM WITH 3D TOMO WITH CAD  COMPARISON:  Previous exam(s).  ACR Breast Density Category b: There are scattered areas of fibroglandular density.  FINDINGS: There are no findings suspicious for malignancy. Images were processed with CAD.  IMPRESSION: No mammographic evidence of malignancy. A result letter of this screening mammogram will be mailed directly to the patient.  RECOMMENDATION: Screening mammogram in one year. (Code:SM-B-01Y)  BI-RADS CATEGORY  1: Negative.   Electronically Signed   By: Conchita Paris [name]   On: 07/15/2014 12:56    Assessment & Plan:   Diagnoses and all orders for this visit:  Essential hypertension  B12 deficiency  Morbid  obesity  Mild cognitive disorder  INSOMNIA, PERSISTENT  Other headache syndrome  Adjustment disorder with mixed anxiety and depressed mood  Neoplasm of uncertain behavior of skin  Other orders -     butalbital-acetaminophen-caffeine (FIORICET WITH CODEINE) 50-325-40-30 MG per capsule; Take 1 capsule by mouth every 6 (six) hours as needed for headache.   I have discontinued Ms. Labell's zolpidem. I am also having her start on butalbital-acetaminophen-caffeine. Additionally, I am having her maintain her aspirin, vitamin B-12, Vitamin D, solifenacin, fish oil-omega-3 fatty acids, Multiple Vitamins-Minerals (OCUVITE PO), loratadine, ALIGN, gabapentin, lubiprostone, lovastatin, fluticasone, verapamil, buPROPion, omeprazole, ALPRAZolam, Phosphatidylserine-DHA-EPA, VESICARE, and traZODone.  Meds ordered this encounter  Medications  . VESICARE 10 MG tablet    Sig:   .  traZODone (DESYREL) 50 MG tablet    Sig: Take 50 mg by mouth at bedtime.    Refill:  2  . butalbital-acetaminophen-caffeine (FIORICET WITH CODEINE) 50-325-40-30 MG per capsule    Sig: Take 1 capsule by mouth every 6 (six) hours as needed for headache.    Dispense:  60 capsule    Refill:  0     Follow-up: Return in about 3 months (around 03/26/2015) for a follow-up visit.  Walker Kehr, MD

## 2014-12-24 NOTE — Assessment & Plan Note (Signed)
Wt Readings from Last 3 Encounters:  12/24/14 194 lb (87.998 kg)  09/22/14 195 lb (88.451 kg)  04/07/14 205 lb 1.9 oz (93.042 kg)

## 2014-12-24 NOTE — Assessment & Plan Note (Signed)
Chronic Trazodone - not using Xanax prn  Potential benefits of a long term benzodiazepines  use as well as potential risks  and complications were explained to the patient and were aknowledged.

## 2014-12-24 NOTE — Assessment & Plan Note (Signed)
Chronic On Verapamil 

## 2014-12-29 ENCOUNTER — Ambulatory Visit (INDEPENDENT_AMBULATORY_CARE_PROVIDER_SITE_OTHER): Payer: Medicare Other | Admitting: Internal Medicine

## 2014-12-29 VITALS — BP 150/78 | HR 72 | Wt 195.0 lb

## 2014-12-29 DIAGNOSIS — D489 Neoplasm of uncertain behavior, unspecified: Secondary | ICD-10-CM

## 2014-12-29 DIAGNOSIS — D485 Neoplasm of uncertain behavior of skin: Secondary | ICD-10-CM | POA: Diagnosis not present

## 2014-12-29 DIAGNOSIS — I10 Essential (primary) hypertension: Secondary | ICD-10-CM | POA: Diagnosis not present

## 2014-12-29 NOTE — Assessment & Plan Note (Signed)
Skin bx 

## 2014-12-29 NOTE — Assessment & Plan Note (Signed)
BP OK at home

## 2014-12-29 NOTE — Progress Notes (Signed)
Procedure Note :     Procedure :  Skin biopsy   Indication:  Suspicious lesion(s)   Risks including unsuccessful procedure , bleeding, infection, bruising, scar, a need for another complete procedure and others were explained to the patient in detail as well as the benefits. Informed consent was obtained and signed.   The patient was placed in a decubitus position.  Lesion #1 on a R cheek measuring   6x4  mm   Skin over lesion #1  was prepped with Betadine and alcohol  and anesthetized with 1/2 cc of 2% lidocaine and epinephrine, using a 25-gauge 1 inch needle.  Shave biopsy with a sterile Dermablade was carried out in the usual fashion. Hyfrecator was used to destroy the rest of the lesion potentially left behind and for hemostasis. Band-Aid was applied with antibiotic ointment.   BP Readings from Last 3 Encounters:  12/29/14 150/78  12/24/14 122/88  09/22/14 150/84

## 2015-03-28 ENCOUNTER — Ambulatory Visit: Payer: Medicare Other | Admitting: Internal Medicine

## 2015-04-18 ENCOUNTER — Ambulatory Visit: Payer: Medicare Other | Admitting: Internal Medicine

## 2015-05-05 ENCOUNTER — Other Ambulatory Visit: Payer: Self-pay | Admitting: Internal Medicine

## 2015-05-17 ENCOUNTER — Ambulatory Visit: Payer: Medicare Other | Admitting: Internal Medicine

## 2015-05-17 DIAGNOSIS — Z0289 Encounter for other administrative examinations: Secondary | ICD-10-CM

## 2015-06-21 ENCOUNTER — Encounter: Payer: Self-pay | Admitting: Internal Medicine

## 2015-08-08 ENCOUNTER — Other Ambulatory Visit: Payer: Self-pay

## 2015-08-08 DIAGNOSIS — Z1231 Encounter for screening mammogram for malignant neoplasm of breast: Secondary | ICD-10-CM

## 2015-08-11 ENCOUNTER — Ambulatory Visit
Admission: RE | Admit: 2015-08-11 | Discharge: 2015-08-11 | Disposition: A | Discharge status: Home / Self Care | Payer: Medicare Other | Source: Ambulatory Visit | Attending: Gynecology | Admitting: Gynecology

## 2015-08-11 DIAGNOSIS — Z1231 Encounter for screening mammogram for malignant neoplasm of breast: Secondary | ICD-10-CM

## 2015-08-25 ENCOUNTER — Ambulatory Visit (INDEPENDENT_AMBULATORY_CARE_PROVIDER_SITE_OTHER): Payer: Medicare Other | Admitting: Ophthalmology

## 2015-09-05 ENCOUNTER — Ambulatory Visit (INDEPENDENT_AMBULATORY_CARE_PROVIDER_SITE_OTHER): Payer: Medicare Other | Admitting: Ophthalmology

## 2015-09-05 DIAGNOSIS — H353132 Nonexudative age-related macular degeneration, bilateral, intermediate dry stage: Secondary | ICD-10-CM

## 2015-09-05 DIAGNOSIS — I10 Essential (primary) hypertension: Secondary | ICD-10-CM

## 2015-09-05 DIAGNOSIS — H43813 Vitreous degeneration, bilateral: Secondary | ICD-10-CM | POA: Diagnosis not present

## 2015-09-05 DIAGNOSIS — H35033 Hypertensive retinopathy, bilateral: Secondary | ICD-10-CM

## 2015-09-15 ENCOUNTER — Telehealth: Payer: Medicare Other | Admitting: Physician Assistant

## 2015-09-15 DIAGNOSIS — R0602 Shortness of breath: Secondary | ICD-10-CM

## 2015-09-15 NOTE — Progress Notes (Signed)
Based on what you shared with me it looks like you have a serious condition that should be evaluated in a face to face office visit. You have had symptoms for more than 48 hours so if flu is present we are outside the window where we can give Tamiflu to help with symptoms. Giving your mention of shortness of breath, I would like for you to go see your primary care provider or one of the practices below for assessment and a good examination so the best treatment can be given to you.  If you are having a true medical emergency please call 911.  If you need an urgent face to face visit, [facility] has four urgent care centers for your convenience.  . [facility] a Provider at this Location  [address] [place], [place] [address] . 8 am to 8 pm Monday-Friday . 9 am to 7 pm Saturday-Sunday  . [facility] [place] a Provider at this Location  [address] [place], [place] [address] . 8 am to 8 pm Monday-Friday . 9 am to 6 pm Saturday . 11 am to 6 pm Sunday   . [facility] [place] Get Driving Directions  [id number], [place] [address] . 8 am to 8 pm Monday-Friday . 9 am to 4 pm Saturday-Sunday   . [facility] (a walk in primary care provider)  [place] a Provider at this Location  [place], [place] [address] . 8 am to 8:30 pm Monday-Thursday . 8 am to 6 pm Friday . 8 am to 4 pm Saturday-Sunday   Your e-visit answers were reviewed by a board certified advanced clinical practitioner to complete your personal care plan.  Thank you for using e-Visits.

## 2015-09-16 ENCOUNTER — Ambulatory Visit (INDEPENDENT_AMBULATORY_CARE_PROVIDER_SITE_OTHER): Payer: Medicare Other | Admitting: Family

## 2015-09-16 ENCOUNTER — Ambulatory Visit: Payer: Medicare Other | Admitting: Nurse Practitioner

## 2015-09-16 ENCOUNTER — Encounter: Payer: Self-pay | Admitting: Family

## 2015-09-16 VITALS — BP 118/74 | HR 110 | Temp 98.5°F | Resp 16 | Ht 60.0 in | Wt 192.0 lb

## 2015-09-16 DIAGNOSIS — R52 Pain, unspecified: Secondary | ICD-10-CM | POA: Diagnosis not present

## 2015-09-16 DIAGNOSIS — R55 Syncope and collapse: Secondary | ICD-10-CM

## 2015-09-16 DIAGNOSIS — J111 Influenza due to unidentified influenza virus with other respiratory manifestations: Secondary | ICD-10-CM

## 2015-09-16 LAB — POCT INFLUENZA A/B
INFLUENZA B, POC: POSITIVE — AB
Influenza A, POC: POSITIVE — AB

## 2015-09-16 MED ORDER — HYDROCODONE-HOMATROPINE 5-1.5 MG/5ML PO SYRP: 5.0000 mL | ORAL_SOLUTION | Freq: Three times a day (TID) | ORAL | Status: DC | PRN

## 2015-09-16 NOTE — Patient Instructions (Signed)
Thank you for choosing [facility].  Summary/Instructions:  If your symptoms worsen or fail to improve, please contact our office for further instruction, or in case of emergency go directly to the emergency room at the closest medical facility.   Influenza, Adult Influenza ("the flu") is a viral infection of the respiratory tract. It occurs more often in winter months because people spend more time in close contact with one another. Influenza can make you feel very sick. Influenza easily spreads from person to person (contagious). CAUSES  Influenza is caused by a virus that infects the respiratory tract. You can catch the virus by breathing in droplets from an infected person's cough or sneeze. You can also catch the virus by touching something that was recently contaminated with the virus and then touching your mouth, nose, or eyes. RISKS AND COMPLICATIONS You may be at risk for a more severe case of influenza if you smoke cigarettes, have diabetes, have chronic heart disease (such as heart failure) or lung disease (such as asthma), or if you have a weakened immune system. Elderly people and pregnant women are also at risk for more serious infections. The most common problem of influenza is a lung infection (pneumonia). Sometimes, this problem can require emergency medical care and may be life threatening. SIGNS AND SYMPTOMS  Symptoms typically last 4 to 10 days and may include:  Fever.  Chills.  Headache, body aches, and muscle aches.  Sore throat.  Chest discomfort and cough.  Poor appetite.  Weakness or feeling tired.  Dizziness.  Nausea or vomiting. DIAGNOSIS  Diagnosis of influenza is often made based on your history and a physical exam. A nose or throat swab test can be done to confirm the diagnosis. TREATMENT  In mild cases, influenza goes away on its own. Treatment is directed at relieving symptoms. For more severe cases, your health care provider may prescribe  antiviral medicines to shorten the sickness. Antibiotic medicines are not effective because the infection is caused by a virus, not by bacteria. HOME CARE INSTRUCTIONS  Take medicines only as directed by your health care provider.  Use a cool mist humidifier to make breathing easier.  Get plenty of rest until your temperature returns to normal. This usually takes 3 to 4 days.  Drink enough fluid to keep your urine clear or pale yellow.  Cover yourmouth and nosewhen coughing or sneezing,and wash your handswellto prevent thevirusfrom spreading.  Stay homefromwork orschool untilthe fever is gonefor at least 63full day. PREVENTION  An annual influenza vaccination (flu shot) is the best way to avoid getting influenza. An annual flu shot is now routinely recommended for all adults in the [place] IF:  You experiencechest pain, yourcough worsens,or you producemore mucus.  Youhave nausea,vomiting, ordiarrhea.  Your fever returns or gets worse. SEEK IMMEDIATE MEDICAL CARE IF:  You havetrouble breathing, you become short of breath,or your skin ornails becomebluish.  You have severe painor stiffnessin the neck.  You develop a sudden headache, or pain in the face or ear.  You have nausea or vomiting that you cannot control. MAKE SURE YOU:   Understand these instructions.  Will watch your condition.  Will get help right away if you are not doing well or get worse.   This information is not intended to replace advice given to you by your health care provider. Make sure you discuss any questions you have with your health care provider.   Document Released: 06/01/2000 Document Revised: 06/25/2014 Document Reviewed: 09/03/2011  Chartered certified accountant Patient Education [employer].

## 2015-09-16 NOTE — Progress Notes (Signed)
Subjective:    Patient ID: [name], female    DOB: [birth year], 70 y.o.   MRN: [id number]  Chief Complaint  Patient presents with  . Sore Throat    x3 days, sore throat, ear ache, back ache, and left hip pain    HPI:  [name] is a 70 y.o. female who  has a past medical history of Cholecystitis, unspecified; Ulcer of esophagus; GERD (gastroesophageal reflux disease); Fibromyalgia; Anxiety; Diastolic dysfunction; ANEMIA, DUE TO BLOOD LOSS; B12 DEFICIENCY; DEPRESSION; DIVERTICULOSIS, COLON; DYSLIPIDEMIA; GERD; HYPERTENSION; INSOMNIA, PERSISTENT; Morbid obesity ([place]); OSTEOARTHRITIS; SYNCOPE; VITAMIN D DEFICIENCY; and Hyperplastic colon polyp. and presents today for acute office visit.   This is a new problem. Associated symptom of sore throat, ear ache, shortness of breath and body achiness has been going on for about 3 days. Indicates that she may have fainted last night. Denies fevers but has had some clammy skin. Modifying factors include tylenol and Ciprofloxacin that she she got from her cousin which may have helped a little. Course of the symptoms has worsened over the past couple of days. Feels like she is having hot flashes as well. Has not really had an appetite all week.   Allergies  Allergen Reactions  . Levaquin [Levofloxacin]     nausea     Current Outpatient Prescriptions on File Prior to Visit  Medication Sig Dispense Refill  . ALPRAZolam (XANAX) 0.5 MG tablet Take 1 tablet (0.5 mg total) by mouth 2 (two) times daily as needed. 60 tablet 2  . aspirin 81 MG tablet Take 81 mg by mouth daily.      Marland Kitchen buPROPion (WELLBUTRIN SR) 150 MG 12 hr tablet Take 1 tablet by mouth  twice a day 180 tablet 2  . butalbital-acetaminophen-caffeine (FIORICET WITH CODEINE) 50-325-40-30 MG per capsule Take 1 capsule by mouth every 6 (six) hours as needed for headache. 60 capsule 0  . Cholecalciferol (VITAMIN D) 2000 UNITS CAPS Take 1 capsule by mouth daily.      . fish oil-omega-3 fatty  acids 1000 MG capsule Take by mouth daily. Take  600 mg daily    . fluticasone (FLONASE) 50 MCG/ACT nasal spray Use 2 sprays in each  nostril daily 48 g 2  . gabapentin (NEURONTIN) 100 MG capsule Take 1 capsule by mouth at  bedtime and may repeat dose 1 time if needed 180 capsule 1  . loratadine (CLARITIN) 10 MG tablet Take 1 tablet (10 mg total) by mouth daily. 30 tablet 3  . lovastatin (MEVACOR) 40 MG tablet Take 1 tablet (40 mg total) by mouth at bedtime. 90 tablet 1  . lubiprostone (AMITIZA) 24 MCG capsule Take 1 cap by mouth 2 times daily with a meal 60 capsule 3  . Multiple Vitamins-Minerals (OCUVITE PO) Take 2 tablets by mouth daily.    Marland Kitchen omeprazole (PRILOSEC) 40 MG capsule TAKE 1 CAPSULE BY MOUTH  TWICE DAILY BEFORE A MEAL 180 capsule 1  . Phosphatidylserine-DHA-EPA (VAYACOG) 100-19.5-6.5 MG CAPS 1 po qd 30 capsule 11  . Probiotic Product (ALIGN) 4 MG CAPS Take 1 capsule (4 mg total) by mouth daily. 30 capsule 1  . solifenacin (VESICARE) 5 MG tablet Take 10 mg by mouth daily.      . traZODone (DESYREL) 50 MG tablet Take 50 mg by mouth at bedtime.  2  . verapamil (CALAN-SR) 240 MG CR tablet Take 1 tablet by mouth at  bedtime 90 tablet 1  . VESICARE 10 MG tablet     .  vitamin B-12 (CYANOCOBALAMIN) 1000 MCG tablet Take 500 mcg by mouth daily.      No current facility-administered medications on file prior to visit.    Past Medical History  Diagnosis Date  . Cholecystitis, unspecified   . Ulcer of esophagus   . GERD (gastroesophageal reflux disease)   . Fibromyalgia   . Anxiety   . Diastolic dysfunction   . ANEMIA, DUE TO BLOOD LOSS   . B12 DEFICIENCY   . DEPRESSION   . DIVERTICULOSIS, COLON   . DYSLIPIDEMIA   . GERD   . HYPERTENSION   . INSOMNIA, PERSISTENT   . Morbid obesity ([place])   . OSTEOARTHRITIS   . SYNCOPE   . VITAMIN D DEFICIENCY   . Hyperplastic colon polyp     Review of Systems  Constitutional: Positive for chills and diaphoresis. Negative for fever.  HENT:  Positive for congestion and sore throat.   Respiratory: Positive for shortness of breath.   Cardiovascular: Negative for chest pain, palpitations and leg swelling.  Gastrointestinal: Positive for diarrhea.  Neurological: Positive for syncope. Negative for tremors and numbness.      Objective:    BP 118/74 mmHg  Pulse 110  Temp(Src) 98.5 F (36.9 C) (Oral)  Resp 16  Ht 5' (1.524 m)  Wt 192 lb (87.091 kg)  BMI 37.50 kg/m2  SpO2 94% Nursing note and vital signs reviewed.  Physical Exam  Constitutional: She is oriented to person, place, and time. She appears well-developed and well-nourished. No distress.  HENT:  Right Ear: Hearing, tympanic membrane, external ear and ear canal normal.  Left Ear: Hearing, tympanic membrane, external ear and ear canal normal.  Nose: Nose normal. Right sinus exhibits no maxillary sinus tenderness and no frontal sinus tenderness. Left sinus exhibits no maxillary sinus tenderness and no frontal sinus tenderness.  Mouth/Throat: Uvula is midline, oropharynx is clear and moist and mucous membranes are normal.  Neck: Neck supple.  Cardiovascular: Normal rate, regular rhythm, normal heart sounds and intact distal pulses.  Exam reveals no gallop and no friction rub.   No murmur heard. Pulmonary/Chest: Effort normal and breath sounds normal. She has no wheezes.  Lymphadenopathy:    She has no cervical adenopathy.  Neurological: She is alert and oriented to person, place, and time.  Skin: Skin is warm and dry.  Psychiatric: She has a normal mood and affect. Her behavior is normal. Judgment and thought content normal.       Assessment & Plan:   Problem List Items Addressed This Visit      Respiratory   Influenza - Primary    In office influenza test positive. EKG shows sinus rhythm with no ST segment elevation. Symptoms are most likely due to dehydration secondary to influenza. Treat conservatively with over-the-counter medications as needed for symptom  relief and supportive care. Encouraged adequate hydration and nutrition. Start Hycodan as needed for cough and sleep. Follow-up if symptoms worsen or fail to improve. Advised to seek further care if dizziness or passing out continues.      Relevant Medications   HYDROcodone-homatropine (HYCODAN) 5-1.5 MG/5ML syrup    Other Visit Diagnoses    Syncope, unspecified syncope type        Relevant Orders    EKG 12-Lead (Completed)    Body aches        Relevant Orders    POCT Influenza A/B      I am having Ms. Cosma start on HYDROcodone-homatropine. I am also having her maintain her  aspirin, vitamin B-12, Vitamin D, solifenacin, fish oil-omega-3 fatty acids, Multiple Vitamins-Minerals (OCUVITE PO), loratadine, ALIGN, lubiprostone, fluticasone, buPROPion, ALPRAZolam, Phosphatidylserine-DHA-EPA, VESICARE, traZODone, butalbital-acetaminophen-caffeine, lovastatin, omeprazole, verapamil, and gabapentin.

## 2015-09-16 NOTE — Progress Notes (Signed)
Pre visit review using our clinic review tool, if applicable. No additional management support is needed unless otherwise documented below in the visit note. 

## 2015-09-16 NOTE — Assessment & Plan Note (Signed)
In office influenza test positive. EKG shows sinus rhythm with no ST segment elevation. Symptoms are most likely due to dehydration secondary to influenza. Treat conservatively with over-the-counter medications as needed for symptom relief and supportive care. Encouraged adequate hydration and nutrition. Start Hycodan as needed for cough and sleep. Follow-up if symptoms worsen or fail to improve. Advised to seek further care if dizziness or passing out continues.

## 2015-10-14 ENCOUNTER — Other Ambulatory Visit: Payer: Self-pay | Admitting: Physician Assistant

## 2015-12-28 ENCOUNTER — Ambulatory Visit (INDEPENDENT_AMBULATORY_CARE_PROVIDER_SITE_OTHER): Payer: Medicare Other | Admitting: Internal Medicine

## 2015-12-28 ENCOUNTER — Encounter: Payer: Self-pay | Admitting: Internal Medicine

## 2015-12-28 ENCOUNTER — Other Ambulatory Visit (INDEPENDENT_AMBULATORY_CARE_PROVIDER_SITE_OTHER): Payer: Medicare Other

## 2015-12-28 VITALS — BP 130/70 | HR 87 | Ht 61.25 in | Wt 202.0 lb

## 2015-12-28 DIAGNOSIS — Z0000 Encounter for general adult medical examination without abnormal findings: Secondary | ICD-10-CM

## 2015-12-28 DIAGNOSIS — E538 Deficiency of other specified B group vitamins: Secondary | ICD-10-CM

## 2015-12-28 DIAGNOSIS — I10 Essential (primary) hypertension: Secondary | ICD-10-CM | POA: Diagnosis not present

## 2015-12-28 LAB — CBC WITH DIFFERENTIAL/PLATELET
BASOS PCT: 1 % (ref 0.0–3.0)
Basophils Absolute: 0.1 10*3/uL (ref 0.0–0.1)
EOS ABS: 0.2 10*3/uL (ref 0.0–0.7)
EOS PCT: 2.7 % (ref 0.0–5.0)
HEMATOCRIT: 39.3 % (ref 36.0–46.0)
HEMOGLOBIN: 13.2 g/dL (ref 12.0–15.0)
LYMPHS PCT: 31.4 % (ref 12.0–46.0)
Lymphs Abs: 2.1 10*3/uL (ref 0.7–4.0)
MCHC: 33.6 g/dL (ref 30.0–36.0)
MCV: 89.7 fl (ref 78.0–100.0)
Monocytes Absolute: 0.5 10*3/uL (ref 0.1–1.0)
Monocytes Relative: 6.7 % (ref 3.0–12.0)
Neutro Abs: 3.9 10*3/uL (ref 1.4–7.7)
Neutrophils Relative %: 58.2 % (ref 43.0–77.0)
Platelets: 258 10*3/uL (ref 150.0–400.0)
RBC: 4.38 Mil/uL (ref 3.87–5.11)
RDW: 14.2 % (ref 11.5–15.5)
WBC: 6.8 10*3/uL (ref 4.0–10.5)

## 2015-12-28 LAB — LIPID PANEL
CHOLESTEROL: 256 mg/dL — AB (ref 0–200)
HDL: 46.7 mg/dL (ref >39.00)
LDL CALC: 169 mg/dL — AB (ref 0–99)
NonHDL: 209.2
TRIGLYCERIDES: 200 mg/dL — AB (ref 0.0–149.0)
Total CHOL/HDL Ratio: 5
VLDL: 40 mg/dL (ref 0.0–40.0)

## 2015-12-28 LAB — URINALYSIS, ROUTINE W REFLEX MICROSCOPIC
Bilirubin Urine: NEGATIVE
HGB URINE DIPSTICK: NEGATIVE
KETONES UR: NEGATIVE
NITRITE: NEGATIVE
RBC / HPF: NONE SEEN (ref 0–?)
Specific Gravity, Urine: <=1.005 — AB (ref 1.000–1.030)
TOTAL PROTEIN, URINE-UPE24: NEGATIVE
URINE GLUCOSE: NEGATIVE
UROBILINOGEN UA: 0.2 (ref 0.0–1.0)
pH: 6 (ref 5.0–8.0)

## 2015-12-28 LAB — HEPATITIS C ANTIBODY: HCV Ab: NEGATIVE

## 2015-12-28 LAB — HEPATIC FUNCTION PANEL
ALBUMIN: 4.2 g/dL (ref 3.5–5.2)
ALT: 12 U/L (ref 0–35)
AST: 12 U/L (ref 0–37)
Alkaline Phosphatase: 79 U/L (ref 39–117)
Bilirubin, Direct: 0.1 mg/dL (ref 0.0–0.3)
TOTAL PROTEIN: 6.8 g/dL (ref 6.0–8.3)
Total Bilirubin: 0.8 mg/dL (ref 0.2–1.2)

## 2015-12-28 LAB — BASIC METABOLIC PANEL
BUN: 14 mg/dL (ref 6–23)
CHLORIDE: 105 meq/L (ref 96–112)
CO2: 30 mEq/L (ref 19–32)
Calcium: 9.5 mg/dL (ref 8.4–10.5)
Creatinine, Ser: 0.81 mg/dL (ref 0.40–1.20)
GFR: 74.26 mL/min (ref >60.00)
GLUCOSE: 94 mg/dL (ref 70–99)
POTASSIUM: 4.5 meq/L (ref 3.5–5.1)
Sodium: 142 mEq/L (ref 135–145)

## 2015-12-28 LAB — TSH: TSH: 1.16 u[IU]/mL (ref 0.35–4.50)

## 2015-12-28 MED ORDER — ALPRAZOLAM 0.5 MG PO TABS: 0.5000 mg | ORAL_TABLET | Freq: Two times a day (BID) | ORAL | Status: DC | PRN

## 2015-12-28 NOTE — Progress Notes (Signed)
Subjective:  Patient ID: [name], female    DOB: [birth year]  Age: 70 y.o. MRN: [id number]  CC: No chief complaint on file.   HPI [name] presents for a well exam  Outpatient Prescriptions Prior to Visit  Medication Sig Dispense Refill  . ALPRAZolam (XANAX) 0.5 MG tablet Take 1 tablet (0.5 mg total) by mouth 2 (two) times daily as needed. 60 tablet 2  . aspirin 81 MG tablet Take 81 mg by mouth daily.      Marland Kitchen buPROPion (WELLBUTRIN SR) 150 MG 12 hr tablet Take 1 tablet by mouth  twice a day 180 tablet 2  . butalbital-acetaminophen-caffeine (FIORICET WITH CODEINE) 50-325-40-30 MG per capsule Take 1 capsule by mouth every 6 (six) hours as needed for headache. 60 capsule 0  . Cholecalciferol (VITAMIN D) 2000 UNITS CAPS Take 1 capsule by mouth daily.      . fish oil-omega-3 fatty acids 1000 MG capsule Take by mouth daily. Take  600 mg daily    . fluticasone (FLONASE) 50 MCG/ACT nasal spray Use 2 sprays in each  nostril daily 48 g 2  . gabapentin (NEURONTIN) 100 MG capsule Take 1 capsule by mouth at  bedtime and may repeat dose 1 time if needed 180 capsule 1  . loratadine (CLARITIN) 10 MG tablet Take 1 tablet (10 mg total) by mouth daily. 30 tablet 3  . lovastatin (MEVACOR) 40 MG tablet Take 1 tablet (40 mg total) by mouth at bedtime. 90 tablet 1  . lubiprostone (AMITIZA) 24 MCG capsule Take 1 capsule (24 mcg total) by mouth 2 (two) times daily with a meal. 180 capsule 3  . Multiple Vitamins-Minerals (OCUVITE PO) Take 2 tablets by mouth daily.    Marland Kitchen omeprazole (PRILOSEC) 40 MG capsule TAKE 1 CAPSULE BY MOUTH  TWICE DAILY BEFORE A MEAL 180 capsule 1  . Phosphatidylserine-DHA-EPA (VAYACOG) 100-19.5-6.5 MG CAPS 1 po qd 30 capsule 11  . Probiotic Product (ALIGN) 4 MG CAPS Take 1 capsule (4 mg total) by mouth daily. 30 capsule 1  . traZODone (DESYREL) 50 MG tablet Take 50 mg by mouth at bedtime.  2  . verapamil (CALAN-SR) 240 MG CR tablet Take 1 tablet by mouth at  bedtime 90 tablet 1  .  VESICARE 10 MG tablet     . vitamin B-12 (CYANOCOBALAMIN) 1000 MCG tablet Take 500 mcg by mouth daily.     Marland Kitchen HYDROcodone-homatropine (HYCODAN) 5-1.5 MG/5ML syrup Take 5 mLs by mouth every 8 (eight) hours as needed for cough. (Patient not taking: Reported on 12/28/2015) 180 mL 0  . solifenacin (VESICARE) 5 MG tablet Take 10 mg by mouth daily. Reported on 12/28/2015     No facility-administered medications prior to visit.    ROS Review of Systems  Constitutional: Negative for chills, activity change, appetite change, fatigue and unexpected weight change.  HENT: Negative for congestion, mouth sores and sinus pressure.   Eyes: Negative for visual disturbance.  Respiratory: Negative for cough and chest tightness.   Gastrointestinal: Negative for nausea and abdominal pain.  Genitourinary: Negative for frequency, difficulty urinating and vaginal pain.  Musculoskeletal: Negative for back pain and gait problem.  Skin: Negative for pallor and rash.  Neurological: Negative for dizziness, tremors, weakness, numbness and headaches.  Psychiatric/Behavioral: Negative for suicidal ideas, confusion and sleep disturbance.    Objective:  BP 130/70 mmHg  Pulse 87  Ht 5' 1.25" (1.556 m)  Wt 202 lb (91.627 kg)  BMI 37.84 kg/m2  SpO2 96%  BP Readings from Last 3 Encounters:  12/28/15 130/70  09/16/15 118/74  12/29/14 150/78    Wt Readings from Last 3 Encounters:  12/28/15 202 lb (91.627 kg)  09/16/15 192 lb (87.091 kg)  12/29/14 195 lb (88.451 kg)    Physical Exam  Constitutional: She appears well-developed. No distress.  HENT:  Head: Normocephalic.  Right Ear: External ear normal.  Left Ear: External ear normal.  Nose: Nose normal.  Mouth/Throat: Oropharynx is clear and moist.  Eyes: Conjunctivae are normal. Pupils are equal, round, and reactive to light. Right eye exhibits no discharge. Left eye exhibits no discharge.  Neck: Normal range of motion. Neck supple. No JVD present. No tracheal  deviation present. No thyromegaly present.  Cardiovascular: Normal rate, regular rhythm and normal heart sounds.   Pulmonary/Chest: No stridor. No respiratory distress. She has no wheezes.  Abdominal: Soft. Bowel sounds are normal. She exhibits no distension and no mass. There is no tenderness. There is no rebound and no guarding.  Musculoskeletal: She exhibits tenderness. She exhibits no edema.  Lymphadenopathy:    She has no cervical adenopathy.  Neurological: She displays normal reflexes. No cranial nerve deficit. She exhibits normal muscle tone. Coordination normal.  Skin: No rash noted. No erythema.  Psychiatric: She has a normal mood and affect. Her behavior is normal. Judgment and thought content normal.   Hearing is sub-optimal Obese Lab Results  Component Value Date   WBC 8.7 06/16/2013   HGB 12.8 06/16/2013   HCT 38.5 06/16/2013   PLT 260.0 06/16/2013   GLUCOSE 84 06/16/2013   CHOL 203* 06/16/2013   TRIG 97.0 06/16/2013   HDL 49.80 06/16/2013   LDLDIRECT 137.2 06/16/2013   LDLCALC 120* 07/25/2010   ALT 14 06/16/2013   AST 12 06/16/2013   NA 141 06/16/2013   K 4.1 06/16/2013   CL 105 06/16/2013   CREATININE 0.8 06/16/2013   BUN 13 06/16/2013   CO2 30 06/16/2013   TSH 1.26 06/16/2013   INR 0.8 03/13/2007    Mm Screening Breast Tomo Bilateral  08/12/2015  CLINICAL DATA:  Screening. EXAM: DIGITAL SCREENING BILATERAL MAMMOGRAM WITH 3D TOMO WITH CAD COMPARISON:  Previous exam(s). ACR Breast Density Category b: There are scattered areas of fibroglandular density. FINDINGS: There are no findings suspicious for malignancy. Images were processed with CAD. IMPRESSION: No mammographic evidence of malignancy. A result letter of this screening mammogram will be mailed directly to the patient. RECOMMENDATION: Screening mammogram in one year. (Code:SM-B-01Y) BI-RADS CATEGORY  1: Negative. Electronically Signed   By: Lajean Manes [name]   On: 08/12/2015 11:48    Assessment & Plan:    There are no diagnoses linked to this encounter. I have discontinued [name]'s solifenacin. I am also having her maintain her aspirin, vitamin B-12, Vitamin D, fish oil-omega-3 fatty acids, Multiple Vitamins-Minerals (OCUVITE PO), loratadine, ALIGN, fluticasone, buPROPion, ALPRAZolam, Phosphatidylserine-DHA-EPA, VESICARE, traZODone, butalbital-acetaminophen-caffeine, lovastatin, omeprazole, verapamil, gabapentin, HYDROcodone-homatropine, lubiprostone, and VOLTAREN.  Meds ordered this encounter  Medications  . VOLTAREN 1 % GEL    Sig: Place 3 g onto the skin 3 (three) times daily.    Refill:  3     Follow-up: No Follow-up on file.  Walker Kehr, MD

## 2015-12-28 NOTE — Assessment & Plan Note (Signed)
On Verapamil 

## 2015-12-28 NOTE — Progress Notes (Signed)
Pre visit review using our clinic review tool, if applicable. No additional management support is needed unless otherwise documented below in the visit note. 

## 2015-12-28 NOTE — Assessment & Plan Note (Signed)

## 2015-12-28 NOTE — Patient Instructions (Signed)
Preventive Care for Adults, Female A healthy lifestyle and preventive care can promote health and wellness. Preventive health guidelines for women include the following key practices.  A routine yearly physical is a good way to check with your health care provider about your health and preventive screening. It is a chance to share any concerns and updates on your health and to receive a thorough exam.  Visit your dentist for a routine exam and preventive care every 6 months. Brush your teeth twice a day and floss once a day. Good oral hygiene prevents tooth decay and gum disease.  The frequency of eye exams is based on your age, health, family medical history, use of contact lenses, and other factors. Follow your health care provider's recommendations for frequency of eye exams.  Eat a healthy diet. Foods like vegetables, fruits, whole grains, low-fat dairy products, and lean protein foods contain the nutrients you need without too many calories. Decrease your intake of foods high in solid fats, added sugars, and salt. Eat the right amount of calories for you.Get information about a proper diet from your health care provider, if necessary.  Regular physical exercise is one of the most important things you can do for your health. Most adults should get at least 150 minutes of moderate-intensity exercise (any activity that increases your heart rate and causes you to sweat) each week. In addition, most adults need muscle-strengthening exercises on 2 or more days a week.  Maintain a healthy weight. The body mass index (BMI) is a screening tool to identify possible weight problems. It provides an estimate of body fat based on height and weight. Your health care provider can find your BMI and can help you achieve or maintain a healthy weight.For adults 20 years and older:  A BMI below 18.5 is considered underweight.  A BMI of 18.5 to 24.9 is normal.  A BMI of 25 to 29.9 is considered overweight.  A  BMI of 30 and above is considered obese.  Maintain normal blood lipids and cholesterol levels by exercising and minimizing your intake of saturated fat. Eat a balanced diet with plenty of fruit and vegetables. Blood tests for lipids and cholesterol should begin at age 45 and be repeated every 5 years. If your lipid or cholesterol levels are high, you are over 50, or you are at high risk for heart disease, you may need your cholesterol levels checked more frequently.Ongoing high lipid and cholesterol levels should be treated with medicines if diet and exercise are not working.  If you smoke, find out from your health care provider how to quit. If you do not use tobacco, do not start.  Lung cancer screening is recommended for adults aged 45-80 years who are at high risk for developing lung cancer because of a history of smoking. A yearly low-dose CT scan of the lungs is recommended for people who have at least a 30-pack-year history of smoking and are a current smoker or have quit within the past 15 years. A pack year of smoking is smoking an average of 1 pack of cigarettes a day for 1 year (for example: 1 pack a day for 30 years or 2 packs a day for 15 years). Yearly screening should continue until the smoker has stopped smoking for at least 15 years. Yearly screening should be stopped for people who develop a health problem that would prevent them from having lung cancer treatment.  If you are pregnant, do not drink alcohol. If you are  breastfeeding, be very cautious about drinking alcohol. If you are not pregnant and choose to drink alcohol, do not have more than 1 drink per day. One drink is considered to be 12 ounces (355 mL) of beer, 5 ounces (148 mL) of wine, or 1.5 ounces (44 mL) of liquor.  Avoid use of street drugs. Do not share needles with anyone. Ask for help if you need support or instructions about stopping the use of drugs.  High blood pressure causes heart disease and increases the risk  of stroke. Your blood pressure should be checked at least every 1 to 2 years. Ongoing high blood pressure should be treated with medicines if weight loss and exercise do not work.  If you are 55-79 years old, ask your health care provider if you should take aspirin to prevent strokes.  Diabetes screening is done by taking a blood sample to check your blood glucose level after you have not eaten for a certain period of time (fasting). If you are not overweight and you do not have risk factors for diabetes, you should be screened once every 3 years starting at age 45. If you are overweight or obese and you are 40-70 years of age, you should be screened for diabetes every year as part of your cardiovascular risk assessment.  Breast cancer screening is essential preventive care for women. You should practice "breast self-awareness." This means understanding the normal appearance and feel of your breasts and may include breast self-examination. Any changes detected, no matter how small, should be reported to a health care provider. Women in their 20s and 30s should have a clinical breast exam (CBE) by a health care provider as part of a regular health exam every 1 to 3 years. After age 40, women should have a CBE every year. Starting at age 40, women should consider having a mammogram (breast X-ray test) every year. Women who have a family history of breast cancer should talk to their health care provider about genetic screening. Women at a high risk of breast cancer should talk to their health care providers about having an MRI and a mammogram every year.  Breast cancer gene (BRCA)-related cancer risk assessment is recommended for women who have family members with BRCA-related cancers. BRCA-related cancers include breast, ovarian, tubal, and peritoneal cancers. Having family members with these cancers may be associated with an increased risk for harmful changes (mutations) in the breast cancer genes BRCA1 and  BRCA2. Results of the assessment will determine the need for genetic counseling and BRCA1 and BRCA2 testing.  Your health care provider may recommend that you be screened regularly for cancer of the pelvic organs (ovaries, uterus, and vagina). This screening involves a pelvic examination, including checking for microscopic changes to the surface of your cervix (Pap test). You may be encouraged to have this screening done every 3 years, beginning at age 21.  For women ages 30-65, health care providers may recommend pelvic exams and Pap testing every 3 years, or they may recommend the Pap and pelvic exam, combined with testing for human papilloma virus (HPV), every 5 years. Some types of HPV increase your risk of cervical cancer. Testing for HPV may also be done on women of any age with unclear Pap test results.  Other health care providers may not recommend any screening for nonpregnant women who are considered low risk for pelvic cancer and who do not have symptoms. Ask your health care provider if a screening pelvic exam is right for   you.  If you have had past treatment for cervical cancer or a condition that could lead to cancer, you need Pap tests and screening for cancer for at least 20 years after your treatment. If Pap tests have been discontinued, your risk factors (such as having a new sexual partner) need to be reassessed to determine if screening should resume. Some women have medical problems that increase the chance of getting cervical cancer. In these cases, your health care provider may recommend more frequent screening and Pap tests.  Colorectal cancer can be detected and often prevented. Most routine colorectal cancer screening begins at the age of 50 years and continues through age 75 years. However, your health care provider may recommend screening at an earlier age if you have risk factors for colon cancer. On a yearly basis, your health care provider may provide home test kits to check  for hidden blood in the stool. Use of a small camera at the end of a tube, to directly examine the colon (sigmoidoscopy or colonoscopy), can detect the earliest forms of colorectal cancer. Talk to your health care provider about this at age 50, when routine screening begins. Direct exam of the colon should be repeated every 5-10 years through age 75 years, unless early forms of precancerous polyps or small growths are found.  People who are at an increased risk for hepatitis B should be screened for this virus. You are considered at high risk for hepatitis B if:  You were born in a country where hepatitis B occurs often. Talk with your health care provider about which countries are considered high risk.  Your parents were born in a high-risk country and you have not received a shot to protect against hepatitis B (hepatitis B vaccine).  You have HIV or AIDS.  You use needles to inject street drugs.  You live with, or have sex with, someone who has hepatitis B.  You get hemodialysis treatment.  You take certain medicines for conditions like cancer, organ transplantation, and autoimmune conditions.  Hepatitis C blood testing is recommended for all people born from 1945 through 1965 and any individual with known risks for hepatitis C.  Practice safe sex. Use condoms and avoid high-risk sexual practices to reduce the spread of sexually transmitted infections (STIs). STIs include gonorrhea, chlamydia, syphilis, trichomonas, herpes, HPV, and human immunodeficiency virus (HIV). Herpes, HIV, and HPV are viral illnesses that have no cure. They can result in disability, cancer, and death.  You should be screened for sexually transmitted illnesses (STIs) including gonorrhea and chlamydia if:  You are sexually active and are younger than 24 years.  You are older than 24 years and your health care provider tells you that you are at risk for this type of infection.  Your sexual activity has changed  since you were last screened and you are at an increased risk for chlamydia or gonorrhea. Ask your health care provider if you are at risk.  If you are at risk of being infected with HIV, it is recommended that you take a prescription medicine daily to prevent HIV infection. This is called preexposure prophylaxis (PrEP). You are considered at risk if:  You are sexually active and do not regularly use condoms or know the HIV status of your partner(s).  You take drugs by injection.  You are sexually active with a partner who has HIV.  Talk with your health care provider about whether you are at high risk of being infected with HIV. If   you choose to begin PrEP, you should first be tested for HIV. You should then be tested every 3 months for as long as you are taking PrEP.  Osteoporosis is a disease in which the bones lose minerals and strength with aging. This can result in serious bone fractures or breaks. The risk of osteoporosis can be identified using a bone density scan. Women ages 67 years and over and women at risk for fractures or osteoporosis should discuss screening with their health care providers. Ask your health care provider whether you should take a calcium supplement or vitamin D to reduce the rate of osteoporosis.  Menopause can be associated with physical symptoms and risks. Hormone replacement therapy is available to decrease symptoms and risks. You should talk to your health care provider about whether hormone replacement therapy is right for you.  Use sunscreen. Apply sunscreen liberally and repeatedly throughout the day. You should seek shade when your shadow is shorter than you. Protect yourself by wearing long sleeves, pants, a wide-brimmed hat, and sunglasses year round, whenever you are outdoors.  Once a month, do a whole body skin exam, using a mirror to look at the skin on your back. Tell your health care provider of new moles, moles that have irregular borders, moles that  are larger than a pencil eraser, or moles that have changed in shape or color.  Stay current with required vaccines (immunizations).  Influenza vaccine. All adults should be immunized every year.  Tetanus, diphtheria, and acellular pertussis (Td, Tdap) vaccine. Pregnant women should receive 1 dose of Tdap vaccine during each pregnancy. The dose should be obtained regardless of the length of time since the last dose. Immunization is preferred during the 27th-36th week of gestation. An adult who has not previously received Tdap or who does not know her vaccine status should receive 1 dose of Tdap. This initial dose should be followed by tetanus and diphtheria toxoids (Td) booster doses every 10 years. Adults with an unknown or incomplete history of completing a 3-dose immunization series with Td-containing vaccines should begin or complete a primary immunization series including a Tdap dose. Adults should receive a Td booster every 10 years.  Varicella vaccine. An adult without evidence of immunity to varicella should receive 2 doses or a second dose if she has previously received 1 dose. Pregnant females who do not have evidence of immunity should receive the first dose after pregnancy. This first dose should be obtained before leaving the health care facility. The second dose should be obtained 4-8 weeks after the first dose.  Human papillomavirus (HPV) vaccine. Females aged 13-26 years who have not received the vaccine previously should obtain the 3-dose series. The vaccine is not recommended for use in pregnant females. However, pregnancy testing is not needed before receiving a dose. If a female is found to be pregnant after receiving a dose, no treatment is needed. In that case, the remaining doses should be delayed until after the pregnancy. Immunization is recommended for any person with an immunocompromised condition through the age of 61 years if she did not get any or all doses earlier. During the  3-dose series, the second dose should be obtained 4-8 weeks after the first dose. The third dose should be obtained 24 weeks after the first dose and 16 weeks after the second dose.  Zoster vaccine. One dose is recommended for adults aged 30 years or older unless certain conditions are present.  Measles, mumps, and rubella (MMR) vaccine. Adults born  before 1957 generally are considered immune to measles and mumps. Adults born in 1957 or later should have 1 or more doses of MMR vaccine unless there is a contraindication to the vaccine or there is laboratory evidence of immunity to each of the three diseases. A routine second dose of MMR vaccine should be obtained at least 28 days after the first dose for students attending postsecondary schools, health care workers, or international travelers. People who received inactivated measles vaccine or an unknown type of measles vaccine during 1963-1967 should receive 2 doses of MMR vaccine. People who received inactivated mumps vaccine or an unknown type of mumps vaccine before 1979 and are at high risk for mumps infection should consider immunization with 2 doses of MMR vaccine. For females of childbearing age, rubella immunity should be determined. If there is no evidence of immunity, females who are not pregnant should be vaccinated. If there is no evidence of immunity, females who are pregnant should delay immunization until after pregnancy. Unvaccinated health care workers born before 1957 who lack laboratory evidence of measles, mumps, or rubella immunity or laboratory confirmation of disease should consider measles and mumps immunization with 2 doses of MMR vaccine or rubella immunization with 1 dose of MMR vaccine.  Pneumococcal 13-valent conjugate (PCV13) vaccine. When indicated, a person who is uncertain of his immunization history and has no record of immunization should receive the PCV13 vaccine. All adults 65 years of age and older should receive this  vaccine. An adult aged 19 years or older who has certain medical conditions and has not been previously immunized should receive 1 dose of PCV13 vaccine. This PCV13 should be followed with a dose of pneumococcal polysaccharide (PPSV23) vaccine. Adults who are at high risk for pneumococcal disease should obtain the PPSV23 vaccine at least 8 weeks after the dose of PCV13 vaccine. Adults older than 70 years of age who have normal immune system function should obtain the PPSV23 vaccine dose at least 1 year after the dose of PCV13 vaccine.  Pneumococcal polysaccharide (PPSV23) vaccine. When PCV13 is also indicated, PCV13 should be obtained first. All adults aged 65 years and older should be immunized. An adult younger than age 65 years who has certain medical conditions should be immunized. Any person who resides in a nursing home or long-term care facility should be immunized. An adult smoker should be immunized. People with an immunocompromised condition and certain other conditions should receive both PCV13 and PPSV23 vaccines. People with human immunodeficiency virus (HIV) infection should be immunized as soon as possible after diagnosis. Immunization during chemotherapy or radiation therapy should be avoided. Routine use of PPSV23 vaccine is not recommended for American Indians, [place] Natives, or people younger than 65 years unless there are medical conditions that require PPSV23 vaccine. When indicated, people who have unknown immunization and have no record of immunization should receive PPSV23 vaccine. One-time revaccination 5 years after the first dose of PPSV23 is recommended for people aged 19-64 years who have chronic kidney failure, nephrotic syndrome, asplenia, or immunocompromised conditions. People who received 1-2 doses of PPSV23 before age 65 years should receive another dose of PPSV23 vaccine at age 65 years or later if at least 5 years have passed since the previous dose. Doses of PPSV23 are not  needed for people immunized with PPSV23 at or after age 65 years.  Meningococcal vaccine. Adults with asplenia or persistent complement component deficiencies should receive 2 doses of quadrivalent meningococcal conjugate (MenACWY-D) vaccine. The doses should be obtained   at least 2 months apart. Microbiologists working with certain meningococcal bacteria, [place] recruits, people at risk during an outbreak, and people who travel to or live in countries with a high rate of meningitis should be immunized. A first-year college student up through age 34 years who is living in a residence hall should receive a dose if she did not receive a dose on or after her 16th birthday. Adults who have certain high-risk conditions should receive one or more doses of vaccine.  Hepatitis A vaccine. Adults who wish to be protected from this disease, have certain high-risk conditions, work with hepatitis A-infected animals, work in hepatitis A research labs, or travel to or work in countries with a high rate of hepatitis A should be immunized. Adults who were previously unvaccinated and who anticipate close contact with an international adoptee during the first 60 days after arrival in the [place] from a country with a high rate of hepatitis A should be immunized.  Hepatitis B vaccine. Adults who wish to be protected from this disease, have certain high-risk conditions, may be exposed to blood or other infectious body fluids, are household contacts or sex partners of hepatitis B positive people, are clients or workers in certain care facilities, or travel to or work in countries with a high rate of hepatitis B should be immunized.  Haemophilus influenzae type b (Hib) vaccine. A previously unvaccinated person with asplenia or sickle cell disease or having a scheduled splenectomy should receive 1 dose of Hib vaccine. Regardless of previous immunization, a recipient of a hematopoietic stem cell transplant should receive a  3-dose series 6-12 months after her successful transplant. Hib vaccine is not recommended for adults with HIV infection. Preventive Services / Frequency Ages 35 to 4 years  Blood pressure check.** / Every 3-5 years.  Lipid and cholesterol check.** / Every 5 years beginning at age 60.  Clinical breast exam.** / Every 3 years for women in their 71s and 10s.  BRCA-related cancer risk assessment.** / For women who have family members with a BRCA-related cancer (breast, ovarian, tubal, or peritoneal cancers).  Pap test.** / Every 2 years from ages 76 through 26. Every 3 years starting at age 61 through age 76 or 93 with a history of 3 consecutive normal Pap tests.  HPV screening.** / Every 3 years from ages 37 through ages 60 to 51 with a history of 3 consecutive normal Pap tests.  Hepatitis C blood test.** / For any individual with known risks for hepatitis C.  Skin self-exam. / Monthly.  Influenza vaccine. / Every year.  Tetanus, diphtheria, and acellular pertussis (Tdap, Td) vaccine.** / Consult your health care provider. Pregnant women should receive 1 dose of Tdap vaccine during each pregnancy. 1 dose of Td every 10 years.  Varicella vaccine.** / Consult your health care provider. Pregnant females who do not have evidence of immunity should receive the first dose after pregnancy.  HPV vaccine. / 3 doses over 6 months, if 93 and younger. The vaccine is not recommended for use in pregnant females. However, pregnancy testing is not needed before receiving a dose.  Measles, mumps, rubella (MMR) vaccine.** / You need at least 1 dose of MMR if you were born in 1957 or later. You may also need a 2nd dose. For females of childbearing age, rubella immunity should be determined. If there is no evidence of immunity, females who are not pregnant should be vaccinated. If there is no evidence of immunity, females who are  pregnant should delay immunization until after pregnancy.  Pneumococcal  13-valent conjugate (PCV13) vaccine.** / Consult your health care provider.  Pneumococcal polysaccharide (PPSV23) vaccine.** / 1 to 2 doses if you smoke cigarettes or if you have certain conditions.  Meningococcal vaccine.** / 1 dose if you are age 68 to 8 years and a Market researcher living in a residence hall, or have one of several medical conditions, you need to get vaccinated against meningococcal disease. You may also need additional booster doses.  Hepatitis A vaccine.** / Consult your health care provider.  Hepatitis B vaccine.** / Consult your health care provider.  Haemophilus influenzae type b (Hib) vaccine.** / Consult your health care provider. Ages 7 to 53 years  Blood pressure check.** / Every year.  Lipid and cholesterol check.** / Every 5 years beginning at age 25 years.  Lung cancer screening. / Every year if you are aged 11-80 years and have a 30-pack-year history of smoking and currently smoke or have quit within the past 15 years. Yearly screening is stopped once you have quit smoking for at least 15 years or develop a health problem that would prevent you from having lung cancer treatment.  Clinical breast exam.** / Every year after age 48 years.  BRCA-related cancer risk assessment.** / For women who have family members with a BRCA-related cancer (breast, ovarian, tubal, or peritoneal cancers).  Mammogram.** / Every year beginning at age 41 years and continuing for as long as you are in good health. Consult with your health care provider.  Pap test.** / Every 3 years starting at age 65 years through age 37 or 70 years with a history of 3 consecutive normal Pap tests.  HPV screening.** / Every 3 years from ages 72 years through ages 60 to 40 years with a history of 3 consecutive normal Pap tests.  Fecal occult blood test (FOBT) of stool. / Every year beginning at age 21 years and continuing until age 5 years. You may not need to do this test if you get  a colonoscopy every 10 years.  Flexible sigmoidoscopy or colonoscopy.** / Every 5 years for a flexible sigmoidoscopy or every 10 years for a colonoscopy beginning at age 35 years and continuing until age 48 years.  Hepatitis C blood test.** / For all people born from [birth year] through 1965 and any individual with known risks for hepatitis C.  Skin self-exam. / Monthly.  Influenza vaccine. / Every year.  Tetanus, diphtheria, and acellular pertussis (Tdap/Td) vaccine.** / Consult your health care provider. Pregnant women should receive 1 dose of Tdap vaccine during each pregnancy. 1 dose of Td every 10 years.  Varicella vaccine.** / Consult your health care provider. Pregnant females who do not have evidence of immunity should receive the first dose after pregnancy.  Zoster vaccine.** / 1 dose for adults aged 30 years or older.  Measles, mumps, rubella (MMR) vaccine.** / You need at least 1 dose of MMR if you were born in 1957 or later. You may also need a second dose. For females of childbearing age, rubella immunity should be determined. If there is no evidence of immunity, females who are not pregnant should be vaccinated. If there is no evidence of immunity, females who are pregnant should delay immunization until after pregnancy.  Pneumococcal 13-valent conjugate (PCV13) vaccine.** / Consult your health care provider.  Pneumococcal polysaccharide (PPSV23) vaccine.** / 1 to 2 doses if you smoke cigarettes or if you have certain conditions.  Meningococcal vaccine.** /  Consult your health care provider.  Hepatitis A vaccine.** / Consult your health care provider.  Hepatitis B vaccine.** / Consult your health care provider.  Haemophilus influenzae type b (Hib) vaccine.** / Consult your health care provider. Ages 64 years and over  Blood pressure check.** / Every year.  Lipid and cholesterol check.** / Every 5 years beginning at age 23 years.  Lung cancer screening. / Every year if you  are aged 16-80 years and have a 30-pack-year history of smoking and currently smoke or have quit within the past 15 years. Yearly screening is stopped once you have quit smoking for at least 15 years or develop a health problem that would prevent you from having lung cancer treatment.  Clinical breast exam.** / Every year after age 74 years.  BRCA-related cancer risk assessment.** / For women who have family members with a BRCA-related cancer (breast, ovarian, tubal, or peritoneal cancers).  Mammogram.** / Every year beginning at age 44 years and continuing for as long as you are in good health. Consult with your health care provider.  Pap test.** / Every 3 years starting at age 58 years through age 22 or 39 years with 3 consecutive normal Pap tests. Testing can be stopped between 65 and 70 years with 3 consecutive normal Pap tests and no abnormal Pap or HPV tests in the past 10 years.  HPV screening.** / Every 3 years from ages 64 years through ages 70 or 61 years with a history of 3 consecutive normal Pap tests. Testing can be stopped between 65 and 70 years with 3 consecutive normal Pap tests and no abnormal Pap or HPV tests in the past 10 years.  Fecal occult blood test (FOBT) of stool. / Every year beginning at age 40 years and continuing until age 27 years. You may not need to do this test if you get a colonoscopy every 10 years.  Flexible sigmoidoscopy or colonoscopy.** / Every 5 years for a flexible sigmoidoscopy or every 10 years for a colonoscopy beginning at age 7 years and continuing until age 32 years.  Hepatitis C blood test.** / For all people born from [birth year] through 1965 and any individual with known risks for hepatitis C.  Osteoporosis screening.** / A one-time screening for women ages 30 years and over and women at risk for fractures or osteoporosis.  Skin self-exam. / Monthly.  Influenza vaccine. / Every year.  Tetanus, diphtheria, and acellular pertussis (Tdap/Td)  vaccine.** / 1 dose of Td every 10 years.  Varicella vaccine.** / Consult your health care provider.  Zoster vaccine.** / 1 dose for adults aged 35 years or older.  Pneumococcal 13-valent conjugate (PCV13) vaccine.** / Consult your health care provider.  Pneumococcal polysaccharide (PPSV23) vaccine.** / 1 dose for all adults aged 46 years and older.  Meningococcal vaccine.** / Consult your health care provider.  Hepatitis A vaccine.** / Consult your health care provider.  Hepatitis B vaccine.** / Consult your health care provider.  Haemophilus influenzae type b (Hib) vaccine.** / Consult your health care provider. ** Family history and personal history of risk and conditions may change your health care provider's recommendations.   This information is not intended to replace advice given to you by your health care provider. Make sure you discuss any questions you have with your health care provider.   Document Released: 07/31/2001 Document Revised: 06/25/2014 Document Reviewed: 10/30/2010 Elsevier Interactive Patient Education [employer].

## 2015-12-28 NOTE — Assessment & Plan Note (Signed)
On B12 

## 2015-12-30 ENCOUNTER — Other Ambulatory Visit: Payer: Self-pay | Admitting: Internal Medicine

## 2016-01-02 ENCOUNTER — Other Ambulatory Visit: Payer: Self-pay | Admitting: *Deleted

## 2016-01-02 MED ORDER — BUPROPION HCL ER (SR) 150 MG PO TB12: ORAL_TABLET | ORAL | Status: DC

## 2016-01-05 DIAGNOSIS — Z96653 Presence of artificial knee joint, bilateral: Secondary | ICD-10-CM | POA: Insufficient documentation

## 2016-02-17 ENCOUNTER — Other Ambulatory Visit: Payer: Self-pay | Admitting: Internal Medicine

## 2016-02-23 ENCOUNTER — Ambulatory Visit (INDEPENDENT_AMBULATORY_CARE_PROVIDER_SITE_OTHER): Payer: Medicare Other

## 2016-02-23 DIAGNOSIS — Z23 Encounter for immunization: Secondary | ICD-10-CM | POA: Diagnosis not present

## 2016-03-27 ENCOUNTER — Encounter: Payer: Medicare Other | Admitting: Internal Medicine

## 2016-04-12 ENCOUNTER — Encounter: Payer: Self-pay | Admitting: Internal Medicine

## 2016-04-12 ENCOUNTER — Ambulatory Visit (INDEPENDENT_AMBULATORY_CARE_PROVIDER_SITE_OTHER): Payer: Medicare Other | Admitting: Internal Medicine

## 2016-04-12 VITALS — BP 162/84 | HR 80 | Ht 60.0 in | Wt 206.2 lb

## 2016-04-12 DIAGNOSIS — R1319 Other dysphagia: Secondary | ICD-10-CM | POA: Diagnosis not present

## 2016-04-12 DIAGNOSIS — K219 Gastro-esophageal reflux disease without esophagitis: Secondary | ICD-10-CM | POA: Diagnosis not present

## 2016-04-12 MED ORDER — OMEPRAZOLE 40 MG PO CPDR: DELAYED_RELEASE_CAPSULE | ORAL | 2 refills | Status: DC

## 2016-04-12 NOTE — Progress Notes (Signed)
HISTORY OF PRESENT ILLNESS:  [name] is a 70 y.o. female with GERD, chronic constipation, morbid obesity status post gastroplasty was subsequent takedown gastroplasty, question short segment Barrett's esophagus with negative subsequent biopsies. Patient presents today with a chief complaint of intermittent solid food dysphagia of several months duration. She takes omeprazole 40 mg twice daily with control of classic reflux symptoms. She does describe occasional choking spells. Her chronic constipation is managed with Amitiza. Last complete colonoscopy July 2015 with diminutive colon polyps which were adenomatous. Follow-up in 5 years recommended last upper endoscopy 2013 with evidence of esophagitis and prior surgery.  REVIEW OF SYSTEMS:  All non-GI ROS negative except for sinus and allergy, anxiety, arthritis, cough, fatigue, hearing problems, sleeping problems, shortness of breath  Past Medical History:  Diagnosis Date  . ANEMIA, DUE TO BLOOD LOSS   . Anxiety   . B12 DEFICIENCY   . Cholecystitis, unspecified   . DEPRESSION   . Diastolic dysfunction   . DIVERTICULOSIS, COLON   . DYSLIPIDEMIA   . Fibromyalgia   . GERD   . GERD (gastroesophageal reflux disease)   . Hyperplastic colon polyp   . HYPERTENSION   . INSOMNIA, PERSISTENT   . Morbid obesity ([place])   . OSTEOARTHRITIS   . SYNCOPE   . Ulcer of esophagus   . VITAMIN D DEFICIENCY     Past Surgical History:  Procedure Laterality Date  . ABDOMINAL HYSTERECTOMY    . APPENDECTOMY    . CESAREAN SECTION    . CHOLECYSTECTOMY    . GASTROPLASTY    . gatric stapling    . revearsal of gastric stapling    . TONSILLECTOMY    . TOTAL KNEE ARTHROPLASTY     rt.  . TUBAL LIGATION      Social History [name]  reports that she has never smoked. She has never used smokeless tobacco. She reports that she does not drink alcohol or use drugs.  family history includes Arthritis in her mother; Colon cancer in her maternal aunt,  maternal uncle, and other; Diabetes in her maternal aunt, maternal uncle, paternal uncle, and sister; Emphysema in her father; Heart disease in her father and mother; Hypertension in her mother; Kidney disease in her other; Lung cancer in her sister.  Allergies  Allergen Reactions  . Levaquin [Levofloxacin]     nausea       PHYSICAL EXAMINATION: Vital signs: BP (!) 162/84 (BP Location: Left Arm, Patient Position: Sitting, Cuff Size: Normal)   Pulse 80   Ht 5' (1.524 m) Comment: height measured without shoes  Wt 206 lb 4 oz (93.6 kg)   BMI 40.28 kg/m   Constitutional:Pleasant, obese, generally well-appearing, no acute distress Psychiatric: alert and oriented x3, cooperative Eyes: extraocular movements intact, anicteric, conjunctiva pink Mouth: oral pharynx moist, no lesions Neck: supple no lymphadenopathy Cardiovascular: heart regular rate and rhythm, no murmur Lungs: clear to auscultation bilaterally Abdomen: soft, nontender, obese, nondistended, no obvious ascites, no peritoneal signs, normal bowel sounds, no organomegaly Rectal: Omitted Extremities: no clubbing cyanosis or lower extremity edema bilaterally Skin: no lesions on visible extremities Neuro: No focal deficits. Cranial nerves intact  ASSESSMENT:  #1. Intermittent solid food dysphagia. Rule out peptic stricture #2. GERD on PPI #3. History gastroplasty with subsequent takedown #4. Obesity #5. History of adenomatous colon polyps. Last colonoscopy July 2015  PLAN:  #1. Reflux precautions with attention to weight loss #2. Continue twice a day PPI. Prescription refilled #3. Schedule upper endoscopy with  probable esophageal dilation.The nature of the procedure, as well as the risks, benefits, and alternatives were carefully and thoroughly reviewed with the patient. Ample time for discussion and questions allowed. The patient understood, was satisfied, and agreed to proceed. #4. Surveillance colonoscopy 2020

## 2016-04-12 NOTE — Patient Instructions (Signed)
We have sent the following medications to your pharmacy for you to pick up at your convenience:  Omeprazole  You have been scheduled for an endoscopy. Please follow written instructions given to you at your visit today. If you use inhalers (even only as needed), please bring them with you on the day of your procedure. Your physician has requested that you go to www.[email or web address].com and enter the access code given to you at your visit today. This web site gives a general overview about your procedure. However, you should still follow specific instructions given to you by our office regarding your preparation for the procedure.   

## 2016-04-13 ENCOUNTER — Encounter: Payer: Self-pay | Admitting: Internal Medicine

## 2016-04-19 ENCOUNTER — Encounter: Payer: Self-pay | Admitting: Internal Medicine

## 2016-04-25 ENCOUNTER — Ambulatory Visit (AMBULATORY_SURGERY_CENTER): Payer: Medicare Other | Admitting: Internal Medicine

## 2016-04-25 ENCOUNTER — Encounter: Payer: Self-pay | Admitting: Internal Medicine

## 2016-04-25 VITALS — BP 142/63 | HR 83 | Temp 97.1°F | Resp 12 | Ht 60.0 in | Wt 206.0 lb

## 2016-04-25 DIAGNOSIS — K219 Gastro-esophageal reflux disease without esophagitis: Secondary | ICD-10-CM | POA: Diagnosis not present

## 2016-04-25 DIAGNOSIS — R1310 Dysphagia, unspecified: Secondary | ICD-10-CM | POA: Diagnosis not present

## 2016-04-25 DIAGNOSIS — K222 Esophageal obstruction: Secondary | ICD-10-CM

## 2016-04-25 MED ORDER — SODIUM CHLORIDE 0.9 % IV SOLN: 500.0000 mL | INTRAVENOUS | Status: DC

## 2016-04-25 NOTE — Progress Notes (Signed)
Called to room to assist during endoscopic procedure.  Patient ID and intended procedure confirmed with present staff. Received instructions for my participation in the procedure from the performing physician.  

## 2016-04-25 NOTE — Patient Instructions (Signed)
YOU HAD AN ENDOSCOPIC PROCEDURE TODAY AT [facility]:   Refer to the procedure report that was given to you for any specific questions about what was found during the examination.  If the procedure report does not answer your questions, please call your gastroenterologist to clarify.  If you requested that your care partner not be given the details of your procedure findings, then the procedure report has been included in a sealed envelope for you to review at your convenience later.  YOU SHOULD EXPECT: Some feelings of bloating in the abdomen. Passage of more gas than usual.  Walking can help get rid of the air that was put into your GI tract during the procedure and reduce the bloating. If you had a lower endoscopy (such as a colonoscopy or flexible sigmoidoscopy) you may notice spotting of blood in your stool or on the toilet paper. If you underwent a bowel prep for your procedure, you may not have a normal bowel movement for a few days.  Please Note:  You might notice some irritation and congestion in your nose or some drainage.  This is from the oxygen used during your procedure.  There is no need for concern and it should clear up in a day or so.  SYMPTOMS TO REPORT IMMEDIATELY:    Following upper endoscopy (EGD)  Vomiting of blood or coffee ground material  New chest pain or pain under the shoulder blades  Painful or persistently difficult swallowing  New shortness of breath  Fever of 100F or higher  Black, tarry-looking stools  For urgent or emergent issues, a gastroenterologist can be reached at any hour by calling [phone].   DIET:  We do recommend a small meal at first, but then you may proceed to your regular diet.  Drink plenty of fluids but you should avoid alcoholic beverages for 24 hours.  ACTIVITY:  You should plan to take it easy for the rest of today and you should NOT DRIVE or use heavy machinery until tomorrow (because of the sedation medicines used  during the test).    FOLLOW UP: Our staff will call the number listed on your records the next business day following your procedure to check on you and address any questions or concerns that you may have regarding the information given to you following your procedure. If we do not reach you, we will leave a message.  However, if you are feeling well and you are not experiencing any problems, there is no need to return our call.  We will assume that you have returned to your regular daily activities without incident.  If any biopsies were taken you will be contacted by phone or by letter within the next 1-3 weeks.  Please call us at [phone] if you have not heard about the biopsies in 3 weeks.    SIGNATURES/CONFIDENTIALITY: You and/or your care partner have signed paperwork which will be entered into your electronic medical record.  These signatures attest to the fact that that the information above on your After Visit Summary has been reviewed and is understood.  Full responsibility of the confidentiality of this discharge information lies with you and/or your care-partner.  After dilation information given with instructions.  Stricture information given.

## 2016-04-25 NOTE — Progress Notes (Signed)
To PACU, vss patent aw report to rn 

## 2016-04-25 NOTE — Op Note (Signed)
[facility] Patient Name: [name] Procedure Date: 04/25/2016 1:48 PM MRN: [id number] Endoscopist: Docia Chuck. Henrene Pastor , MD Age: 70 Referring MD:  Date of Birth: 01-Nov-1945 Gender: Female Account #: [id number] Procedure:                Upper GI endoscopy, with [facility] dilation 7F Indications:              Dysphagia Medicines:                Monitored Anesthesia Care Procedure:                Pre-Anesthesia Assessment:                           - Prior to the procedure, a History and Physical                            was performed, and patient medications and                            allergies were reviewed. The patient's tolerance of                            previous anesthesia was also reviewed. The risks                            and benefits of the procedure and the sedation                            options and risks were discussed with the patient.                            All questions were answered, and informed consent                            was obtained. Prior Anticoagulants: The patient has                            taken no previous anticoagulant or antiplatelet                            agents. ASA Grade Assessment: II - A patient with                            mild systemic disease. After reviewing the risks                            and benefits, the patient was deemed in                            satisfactory condition to undergo the procedure.                           After obtaining informed consent, the endoscope was  passed under direct vision. Throughout the                            procedure, the patient's blood pressure, pulse, and                            oxygen saturations were monitored continuously. The                            Model GIF-[id number]) scope was introduced                            through the mouth, and advanced to the second part                            of duodenum. The  upper GI endoscopy was                            accomplished without difficulty. The patient                            tolerated the procedure well. Scope In: Scope Out: Findings:                 One mild benign-appearing, intrinsic stenosis was                            found. The scope was withdrawn. Dilation was                            performed with a Maloney dilator with no resistance                            at 36 Fr. No resistance or heme. Tolerated well.                           The exam of the esophagus was otherwise normal.                           The stomach revealed evidence of prior surgery                            (gastric stapling with subsequent takedown) but was                            otherwise normal.                           The examined duodenum was normal.                           The cardia and gastric fundus were normal on                            retroflexion. Complications:  No immediate complications. Estimated Blood Loss:     Estimated blood loss: none. Impression:               - Benign-appearing esophageal stenosis. Dilated.                           - Normal stomach, aside from postoperative change.                           - Normal examined duodenum.                           - No specimens collected. Recommendation:           - Patient has a contact number available for                            emergencies. The signs and symptoms of potential                            delayed complications were discussed with the                            patient. Return to normal activities tomorrow.                            Written discharge instructions were provided to the                            patient.                           - Post-dilation diet.                           - Continue present medications.                           - Follow-up as needed Docia Chuck. Henrene Pastor, MD 04/25/2016 1:59:28 PM This report has been signed  electronically.

## 2016-04-26 ENCOUNTER — Telehealth: Payer: Self-pay

## 2016-04-26 NOTE — Telephone Encounter (Signed)
  Follow up Call-  Call back number 04/25/2016 12/23/2013  Post procedure Call Back phone  # [phone]  Permission to leave phone message Yes Yes  Some recent data might be hidden     Patient questions:  Do you have a fever, pain , or abdominal swelling? No. Pain Score  0 *  Have you tolerated food without any problems? Yes.    Have you been able to return to your normal activities? Yes.    Do you have any questions about your discharge instructions: Diet   No. Medications  No. Follow up visit  No.  Do you have questions or concerns about your Care? No.  Actions: * If pain score is 4 or above: No action needed, pain <4.

## 2016-04-30 ENCOUNTER — Telehealth: Payer: Medicare Other | Admitting: Family

## 2016-04-30 DIAGNOSIS — B9689 Other specified bacterial agents as the cause of diseases classified elsewhere: Secondary | ICD-10-CM

## 2016-04-30 DIAGNOSIS — J329 Chronic sinusitis, unspecified: Secondary | ICD-10-CM

## 2016-04-30 MED ORDER — AMOXICILLIN-POT CLAVULANATE 875-125 MG PO TABS: 1.0000 | ORAL_TABLET | Freq: Two times a day (BID) | ORAL | 0 refills | Status: DC

## 2016-04-30 NOTE — Progress Notes (Signed)

## 2016-05-02 ENCOUNTER — Ambulatory Visit: Payer: Medicare Other | Admitting: Rheumatology

## 2016-05-15 NOTE — Progress Notes (Signed)
Office Visit Note  Patient: [name]             Date of Birth: 1945/07/20           MRN: [id number]             PCP: Walker Kehr, MD Referring: Cassandria Anger, MD Visit Date: 05/16/2016 Occupation: @GUAROCC @    Subjective:  No chief complaint on file.   History of Present Illness: [name] is a 70 y.o. female  Last seen 10/10/2015 and was requested to return to clinic today (6 months later).  Doing about the same with her fibromyalgia. Rates her discomfort as 5 on a scale of 0-10.  Her main concern is that she is not able to sleep well at night. She has trouble staying asleep. Trazodone does help her and she can fall asleep. She's also having a fair amount of fatigue. We discussed how important it is to do regular exercise and she is agreeable but she has a hard time getting She has ongoing pain to the right knee joint. She has had totally replacements bilaterally through Dr. Jeoffrey Massed office. We discussed the importance of using Voltaren gel there on occasion but she should ask Dr. Lorre Nick if anything else can be done.    Activities of Daily Living:  Patient reports morning stiffness for 15 minutes.   Patient Reports nocturnal pain.  Difficulty dressing/grooming: Reports Difficulty climbing stairs: Reports Difficulty getting out of chair: Reports Difficulty using hands for taps, buttons, cutlery, and/or writing: Reports   Review of Systems  Constitutional: Positive for fatigue.  HENT: Negative for mouth sores and mouth dryness.   Eyes: Negative for dryness.  Respiratory: Negative for shortness of breath.   Gastrointestinal: Negative for constipation and diarrhea.  Musculoskeletal: Positive for myalgias and myalgias.  Skin: Negative for sensitivity to sunlight.  Psychiatric/Behavioral: Positive for sleep disturbance. Negative for decreased concentration.    PMFS History:  Patient Active Problem List   Diagnosis Date Noted  . Influenza 09/16/2015  .  Headache 12/24/2014  . Neoplasm of uncertain behavior of skin 12/24/2014  . 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 Dysuria 04/07/2014  . Cough 08/25/2013  . Well adult exam 06/16/2013  . Sinusitis, chronic 06/09/2013  . URI, acute 06/09/2012  . Restless legs syndrome (RLS) 01/26/2011  . FINGER PAIN 07/28/2010  . PALPITATIONS 10/18/2008  . VITAMIN D DEFICIENCY 10/09/2008  . DYSLIPIDEMIA 10/09/2008  . Morbid obesity ([place]) 10/09/2008  . Essential hypertension 10/09/2008  . SYNCOPE 09/27/2008  . UNSPECIFIED EUSTACHIAN TUBE DISORDER 04/09/2008  . ANXIETY 10/07/2007  . INSOMNIA, PERSISTENT 07/08/2007  . Adjustment disorder with mixed anxiety and depressed mood 07/08/2007  . RHINITIS 07/08/2007  . B12 deficiency 04/07/2007  . ANEMIA, DUE TO BLOOD LOSS 04/07/2007  . OSTEOARTHRITIS 04/07/2007  . GERD 04/04/2007  . DIVERTICULOSIS, COLON 04/04/2007    Past Medical History:  Diagnosis Date  . ANEMIA, DUE TO BLOOD LOSS   . Anxiety   . B12 DEFICIENCY   . Cholecystitis, unspecified   . DEPRESSION   . Diastolic dysfunction   . DIVERTICULOSIS, COLON   . DYSLIPIDEMIA   . Fibromyalgia   . GERD   . GERD (gastroesophageal reflux disease)   . Hyperplastic colon polyp   . HYPERTENSION   . INSOMNIA, PERSISTENT   . Morbid obesity ([place])   . OSTEOARTHRITIS   . SYNCOPE   . Ulcer of esophagus   . VITAMIN D DEFICIENCY     Family History  Problem Relation Age of Onset  . Emphysema Father   . Heart disease Father   . Arthritis Mother   . Hypertension Mother     pulmonary  . Heart disease Mother   . Colon cancer Maternal Aunt   . Diabetes Sister   . Lung cancer Sister   . Colon cancer Maternal Uncle   . Colon cancer Other     nephew  . Diabetes Maternal Aunt     x 4  . Diabetes Maternal Uncle     x 3  . Diabetes Paternal Uncle      1  . Kidney disease Other     niece  . Esophageal cancer Neg Hx   . Liver disease Neg Hx    Past Surgical History:  Procedure Laterality Date  .  ABDOMINAL HYSTERECTOMY    . APPENDECTOMY    . CESAREAN SECTION    . CHOLECYSTECTOMY    . GASTROPLASTY    . gatric stapling    . revearsal of gastric stapling    . TONSILLECTOMY    . TOTAL KNEE ARTHROPLASTY     rt.  . TUBAL LIGATION     Social History   Social History Narrative  . No narrative on file     Objective: Vital Signs: BP 140/70 (BP Location: Left Arm, Patient Position: Sitting, Cuff Size: Large)   Pulse 79   Resp 14   Ht 5' (1.524 m)   Wt 209 lb (94.8 kg)   BMI 40.82 kg/m    Physical Exam  Constitutional: She is oriented to person, place, and time. She appears well-developed and well-nourished.  HENT:  Head: Normocephalic and atraumatic.  Eyes: EOM are normal. Pupils are equal, round, and reactive to light.  Cardiovascular: Normal rate, regular rhythm and normal heart sounds.  Exam reveals no gallop and no friction rub.   No murmur heard. Pulmonary/Chest: Effort normal and breath sounds normal. She has no wheezes. She has no rales.  Abdominal: Soft. Bowel sounds are normal. She exhibits no distension. There is no tenderness. There is no guarding. No hernia.  Musculoskeletal: Normal range of motion. She exhibits no edema, tenderness or deformity.  Lymphadenopathy:    She has no cervical adenopathy.  Neurological: She is alert and oriented to person, place, and time. Coordination normal.  Skin: Skin is warm and dry. Capillary refill takes less than 2 seconds. No rash noted.  Psychiatric: She has a normal mood and affect. Her behavior is normal.     Musculoskeletal Exam:  Full range of motion of all joints Grip strength is equal and strong bilaterally For myalgia tender points are 18 out of 18 positive  CDAI Exam: CDAI Homunculus Exam:   Tenderness:  RLE: tibiofemoral  Joint Counts:  CDAI Tender Joint count: 1 CDAI Swollen Joint count: 0  Right knee joint pain. No redness no swelling. 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 Done by  Dr. Lorre Nick. Will follow with Dr. Lorre Nick.   Investigation: No additional findings. Labs done 01/02/2016.Dr. Alain Marion in [place]. Please see those results for full details. Patient did have CBC with differential and CMP with GFR done and they're close to normal limits.  Imaging: No results found.  Speciality Comments: No specialty comments available.    Procedures:  No procedures performed Allergies: Levaquin [levofloxacin]   Assessment / Plan:     Visit Diagnoses: Fibromyalgia  Other fatigue  Insomnia, unspecified type  Chronic pain of right knee - History of  bilateral total knee replacement through Dr. Lorre Nick; will follow with Dr. Lorre Nick; I advised discussing getting a Shields brace for the right knee through   Plan: Schedule DEXA with [facility]. Her last bone density was with Dr. Garnett [facility]'s office on [address]. It was done Monday, 02/02/2008. AP spine was T score -0.3 Dual neck femur had a T score of -1.1 Dual femur trochanter had a T score of -1.2 Dual femur total had a T score of -0.3 Please see record of patient's bone density in patient's chart.  We will repeat the bone density at [facility] since we do not have access to Dr. Josie Dixon DEXA machine anymore.  Patient is complaining of some right knee joint pain. She has a history of total knee replacement through Dr. Jeoffrey Massed office. She will follow up with him regarding this. He may want to give her [employer] brace to add comfort to her right knee joint. She is using Voltaren gel for pain relief. Were refilling her Voltaren gel today. She likes 10 tubes with 2 refills and we agreeable with this  Refill trazodone 50 mg daily at bedtime 90 pills with one refill  We advised the patient to continue do water aerobics/exercises.  Orders: No orders of the defined types were placed in this encounter.  Meds ordered this encounter  Medications  . traZODone (DESYREL) 50 MG tablet    Sig: Take 1 tablet (50 mg total) by  mouth at bedtime.    Dispense:  90 tablet    Refill:  1    Order Specific Question:   Supervising Provider    Answer:   Bo Merino [[id number]]  . diclofenac sodium (VOLTAREN) 1 % GEL    Sig: Voltaren Gel 3 grams to 3 large joints upto TID 3 TUBES with 3 refills    Dispense:  10 Tube    Refill:  2    Order Specific Question:   Supervising Provider    Answer:   Lyda Perone    Face-to-face time spent with patient was 30 minutes. 50% of time was spent in counseling and coordination of care.  Follow-Up Instructions: Return in about 6 months (around 11/13/2016) for Fibromyalga, fatigue, insomnia, right knee pain.   Eliezer Lofts, PA-C

## 2016-05-16 ENCOUNTER — Encounter: Payer: Self-pay | Admitting: Rheumatology

## 2016-05-16 ENCOUNTER — Ambulatory Visit (INDEPENDENT_AMBULATORY_CARE_PROVIDER_SITE_OTHER): Payer: Medicare Other | Admitting: Rheumatology

## 2016-05-16 VITALS — BP 140/70 | HR 79 | Resp 14 | Ht 60.0 in | Wt 209.0 lb

## 2016-05-16 DIAGNOSIS — M25561 Pain in right knee: Secondary | ICD-10-CM | POA: Diagnosis not present

## 2016-05-16 DIAGNOSIS — R5383 Other fatigue: Secondary | ICD-10-CM | POA: Diagnosis not present

## 2016-05-16 DIAGNOSIS — G4700 Insomnia, unspecified: Secondary | ICD-10-CM

## 2016-05-16 DIAGNOSIS — M8580 Other specified disorders of bone density and structure, unspecified site: Secondary | ICD-10-CM

## 2016-05-16 DIAGNOSIS — M797 Fibromyalgia: Secondary | ICD-10-CM

## 2016-05-16 DIAGNOSIS — G8929 Other chronic pain: Secondary | ICD-10-CM | POA: Diagnosis not present

## 2016-05-16 MED ORDER — DICLOFENAC SODIUM 1 % TD GEL: TRANSDERMAL | 2 refills | Status: DC

## 2016-05-16 MED ORDER — TRAZODONE HCL 50 MG PO TABS: 50.0000 mg | ORAL_TABLET | Freq: Every day | ORAL | 1 refills | Status: DC

## 2016-05-17 ENCOUNTER — Other Ambulatory Visit: Payer: Self-pay | Admitting: *Deleted

## 2016-05-17 ENCOUNTER — Telehealth: Payer: Self-pay | Admitting: *Deleted

## 2016-05-17 DIAGNOSIS — M810 Age-related osteoporosis without current pathological fracture: Secondary | ICD-10-CM

## 2016-05-17 NOTE — Telephone Encounter (Signed)
Order for DEXA faxed to [facility], pending appt.

## 2016-05-17 NOTE — Addendum Note (Signed)
Addended by: Shona Needles on: 05/17/2016 02:34 PM   Modules accepted: Orders

## 2016-05-17 NOTE — Telephone Encounter (Signed)
-----   Message from Eliezer Lofts, [place] sent at 05/16/2016  2:49 PM EST ----- Schedule bone density at [facility] for [place] sure. Her last bone density had a T score of -1.2 of dual femur trochanter on 02/02/2008. She is not on any treatment at this time  Please find scanned DEXA bone density report that patient brought into our office. It was done at [place] office. According to the patient his office has sold the machine.

## 2016-05-17 NOTE — Addendum Note (Signed)
Addended by: Shona Needles on: 05/17/2016 02:42 PM   Modules accepted: Orders

## 2016-05-25 ENCOUNTER — Encounter: Payer: Self-pay | Admitting: Rheumatology

## 2016-05-29 NOTE — Progress Notes (Signed)
DEXA performed [facility] 05/25/16

## 2016-06-18 DIAGNOSIS — C4490 Unspecified malignant neoplasm of skin, unspecified: Secondary | ICD-10-CM

## 2016-06-18 HISTORY — DX: Unspecified malignant neoplasm of skin, unspecified: C44.90

## 2016-06-25 ENCOUNTER — Other Ambulatory Visit: Payer: Self-pay | Admitting: *Deleted

## 2016-06-25 DIAGNOSIS — M8589 Other specified disorders of bone density and structure, multiple sites: Secondary | ICD-10-CM

## 2016-06-27 ENCOUNTER — Telehealth: Payer: Self-pay | Admitting: *Deleted

## 2016-06-27 NOTE — Telephone Encounter (Signed)
Attempted to contact the patient to review bone density test results. Patient shows osteopenia. Recommendations per Dr. Estanislado Pandy are Calcium. Vitamin D and exercise.

## 2016-06-27 NOTE — Telephone Encounter (Signed)
Patient advised of recommendations and verbalized.

## 2016-07-04 ENCOUNTER — Ambulatory Visit: Payer: Medicare Other | Admitting: Internal Medicine

## 2016-07-09 ENCOUNTER — Other Ambulatory Visit: Payer: Self-pay | Admitting: Gynecology

## 2016-07-09 DIAGNOSIS — Z1231 Encounter for screening mammogram for malignant neoplasm of breast: Secondary | ICD-10-CM

## 2016-07-13 ENCOUNTER — Ambulatory Visit (INDEPENDENT_AMBULATORY_CARE_PROVIDER_SITE_OTHER)
Admission: RE | Admit: 2016-07-13 | Discharge: 2016-07-13 | Disposition: A | Discharge status: Home / Self Care | Payer: Medicare Other | Source: Ambulatory Visit | Attending: Internal Medicine | Admitting: Internal Medicine

## 2016-07-13 ENCOUNTER — Encounter: Payer: Self-pay | Admitting: Internal Medicine

## 2016-07-13 ENCOUNTER — Ambulatory Visit (INDEPENDENT_AMBULATORY_CARE_PROVIDER_SITE_OTHER): Payer: Medicare Other | Admitting: Internal Medicine

## 2016-07-13 DIAGNOSIS — R05 Cough: Secondary | ICD-10-CM

## 2016-07-13 DIAGNOSIS — D485 Neoplasm of uncertain behavior of skin: Secondary | ICD-10-CM | POA: Diagnosis not present

## 2016-07-13 DIAGNOSIS — H9190 Unspecified hearing loss, unspecified ear: Secondary | ICD-10-CM | POA: Insufficient documentation

## 2016-07-13 DIAGNOSIS — I10 Essential (primary) hypertension: Secondary | ICD-10-CM

## 2016-07-13 DIAGNOSIS — H9193 Unspecified hearing loss, bilateral: Secondary | ICD-10-CM | POA: Diagnosis not present

## 2016-07-13 DIAGNOSIS — R059 Cough, unspecified: Secondary | ICD-10-CM

## 2016-07-13 NOTE — Assessment & Plan Note (Signed)
Will bx

## 2016-07-13 NOTE — Progress Notes (Signed)
Pre visit review using our clinic review tool, if applicable. No additional management support is needed unless otherwise documented below in the visit note. 

## 2016-07-13 NOTE — Assessment & Plan Note (Signed)
ENT ref 

## 2016-07-13 NOTE — Assessment & Plan Note (Signed)
BP Readings from Last 3 Encounters:  07/13/16 (!) 154/84  05/16/16 140/70  04/25/16 (!) 142/63

## 2016-07-13 NOTE — Progress Notes (Signed)
Subjective:  Patient ID: [name], female    DOB: [birth year]  Age: 71 y.o. MRN: [id number]  CC: No chief complaint on file.   HPI [name] presents for depression, anxiety C/o hearing loss  C/o sinus congestion, cough  Outpatient Medications Prior to Visit  Medication Sig Dispense Refill  . ALPRAZolam (XANAX) 0.5 MG tablet Take 1 tablet (0.5 mg total) by mouth 2 (two) times daily as needed. 60 tablet 3  . aspirin 81 MG tablet Take 81 mg by mouth daily.      Marland Kitchen buPROPion (WELLBUTRIN SR) 150 MG 12 hr tablet Take 1 tablet by mouth  twice a day 180 tablet 3  . butalbital-acetaminophen-caffeine (FIORICET WITH CODEINE) 50-325-40-30 MG per capsule Take 1 capsule by mouth every 6 (six) hours as needed for headache. 60 capsule 0  . Cholecalciferol (VITAMIN D) 2000 UNITS CAPS Take 1 capsule by mouth daily.      . diclofenac sodium (VOLTAREN) 1 % GEL Voltaren Gel 3 grams to 3 large joints upto TID 3 TUBES with 3 refills 10 Tube 2  . fish oil-omega-3 fatty acids 1000 MG capsule Take by mouth daily. Take  600 mg daily    . fluticasone (FLONASE) 50 MCG/ACT nasal spray Use 2 sprays in each  nostril daily 48 g 3  . gabapentin (NEURONTIN) 100 MG capsule Take 1 capsule by mouth at  bedtime and may repeat dose 1 time if needed 180 capsule 1  . loratadine (CLARITIN) 10 MG tablet Take 1 tablet (10 mg total) by mouth daily. 30 tablet 3  . lovastatin (MEVACOR) 40 MG tablet Take 1 tablet by mouth at  bedtime 90 tablet 2  . lubiprostone (AMITIZA) 24 MCG capsule Take 1 capsule (24 mcg total) by mouth 2 (two) times daily with a meal. 180 capsule 3  . Multiple Vitamins-Minerals (OCUVITE PO) Take 2 tablets by mouth daily.    Marland Kitchen omeprazole (PRILOSEC) 40 MG capsule TAKE 1 CAPSULE BY MOUTH  TWICE DAILY BEFORE MEALS 180 capsule 2  . Phosphatidylserine-DHA-EPA (VAYACOG) 100-19.5-6.5 MG CAPS 1 po qd 30 capsule 11  . Probiotic Product (ALIGN) 4 MG CAPS Take 1 capsule (4 mg total) by mouth daily. 30 capsule 1  .  traZODone (DESYREL) 50 MG tablet Take 1 tablet (50 mg total) by mouth at bedtime. 90 tablet 1  . verapamil (CALAN-SR) 240 MG CR tablet Take 1 tablet by mouth at  bedtime 90 tablet 2  . VESICARE 10 MG tablet     . vitamin B-12 (CYANOCOBALAMIN) 1000 MCG tablet Take 500 mcg by mouth daily.     Marland Kitchen amoxicillin-clavulanate (AUGMENTIN) 875-125 MG tablet Take 1 tablet by mouth every 12 (twelve) hours. 14 tablet 0  . VOLTAREN 1 % GEL Place 3 g onto the skin 3 (three) times daily.  3   Facility-Administered Medications Prior to Visit  Medication Dose Route Frequency Provider Last Rate Last Dose  . 0.9 %  sodium chloride infusion  500 mL Intravenous Continuous Irene Shipper, MD        ROS Review of Systems  Constitutional: Negative for activity change, appetite change, chills, fatigue and unexpected weight change.  HENT: Positive for congestion and hearing loss. Negative for mouth sores and sinus pressure.   Eyes: Negative for visual disturbance.  Respiratory: Negative for cough and chest tightness.   Gastrointestinal: Negative for abdominal pain and nausea.  Genitourinary: Negative for difficulty urinating, frequency and vaginal pain.  Musculoskeletal: Negative for back pain and  gait problem.  Skin: Negative for pallor and rash.  Neurological: Negative for dizziness, tremors, weakness, numbness and headaches.  Psychiatric/Behavioral: Negative for confusion and sleep disturbance.    Objective:  BP (!) 154/84   Pulse 83   Wt 207 lb (93.9 kg)   SpO2 97%   BMI 40.43 kg/m   BP Readings from Last 3 Encounters:  07/13/16 (!) 154/84  05/16/16 140/70  04/25/16 (!) 142/63    Wt Readings from Last 3 Encounters:  07/13/16 207 lb (93.9 kg)  05/16/16 209 lb (94.8 kg)  04/25/16 206 lb (93.4 kg)    Physical Exam  Constitutional: She appears well-developed. No distress.  HENT:  Head: Normocephalic.  Right Ear: External ear normal.  Left Ear: External ear normal.  Nose: Nose normal.    Mouth/Throat: Oropharynx is clear and moist.  Eyes: Conjunctivae are normal. Pupils are equal, round, and reactive to light. Right eye exhibits no discharge. Left eye exhibits no discharge.  Neck: Normal range of motion. Neck supple. No JVD present. No tracheal deviation present. No thyromegaly present.  Cardiovascular: Normal rate, regular rhythm and normal heart sounds.   Pulmonary/Chest: No stridor. No respiratory distress. She has no wheezes.  Abdominal: Soft. Bowel sounds are normal. She exhibits no distension and no mass. There is no tenderness. There is no rebound and no guarding.  Musculoskeletal: She exhibits no edema or tenderness.  Lymphadenopathy:    She has no cervical adenopathy.  Neurological: She displays normal reflexes. No cranial nerve deficit. She exhibits normal muscle tone. Coordination normal.  Skin: No rash noted. No erythema.  Psychiatric: She has a normal mood and affect. Her behavior is normal. Judgment and thought content normal.  3 mm lip lesion  Lab Results  Component Value Date   WBC 6.8 12/28/2015   HGB 13.2 12/28/2015   HCT 39.3 12/28/2015   PLT 258.0 12/28/2015   GLUCOSE 94 12/28/2015   CHOL 256 (H) 12/28/2015   TRIG 200.0 (H) 12/28/2015   HDL 46.70 12/28/2015   LDLDIRECT 137.2 06/16/2013   LDLCALC 169 (H) 12/28/2015   ALT 12 12/28/2015   AST 12 12/28/2015   NA 142 12/28/2015   K 4.5 12/28/2015   CL 105 12/28/2015   CREATININE 0.81 12/28/2015   BUN 14 12/28/2015   CO2 30 12/28/2015   TSH 1.16 12/28/2015   INR 0.8 03/13/2007    Mm Screening Breast Tomo Bilateral  Result Date: 08/12/2015 CLINICAL DATA:  Screening. EXAM: DIGITAL SCREENING BILATERAL MAMMOGRAM WITH 3D TOMO WITH CAD COMPARISON:  Previous exam(s). ACR Breast Density Category b: There are scattered areas of fibroglandular density. FINDINGS: There are no findings suspicious for malignancy. Images were processed with CAD. IMPRESSION: No mammographic evidence of malignancy. A result  letter of this screening mammogram will be mailed directly to the patient. RECOMMENDATION: Screening mammogram in one year. (Code:SM-B-01Y) BI-RADS CATEGORY  1: Negative. Electronically Signed   By: Lajean Manes [name]   On: 08/12/2015 11:48    Assessment & Plan:   There are no diagnoses linked to this encounter. I have discontinued Ms. Purtle's VOLTAREN and amoxicillin-clavulanate. I am also having her maintain her aspirin, vitamin B-12, Vitamin D, fish oil-omega-3 fatty acids, Multiple Vitamins-Minerals (OCUVITE PO), loratadine, ALIGN, Phosphatidylserine-DHA-EPA, VESICARE, butalbital-acetaminophen-caffeine, gabapentin, lubiprostone, ALPRAZolam, fluticasone, buPROPion, verapamil, lovastatin, omeprazole, traZODone, and diclofenac sodium. We will continue to administer sodium chloride.  No orders of the defined types were placed in this encounter.    Follow-up: No Follow-up on file.  Walker Kehr, MD

## 2016-07-13 NOTE — Assessment & Plan Note (Signed)
CXR

## 2016-08-08 ENCOUNTER — Encounter: Payer: Self-pay | Admitting: Internal Medicine

## 2016-08-15 ENCOUNTER — Ambulatory Visit (INDEPENDENT_AMBULATORY_CARE_PROVIDER_SITE_OTHER): Payer: Medicare Other | Admitting: Internal Medicine

## 2016-08-15 ENCOUNTER — Encounter: Payer: Self-pay | Admitting: Internal Medicine

## 2016-08-15 VITALS — BP 130/78 | HR 68 | Temp 98.1°F | Wt 204.0 lb

## 2016-08-15 DIAGNOSIS — C44329 Squamous cell carcinoma of skin of other parts of face: Secondary | ICD-10-CM

## 2016-08-15 DIAGNOSIS — L989 Disorder of the skin and subcutaneous tissue, unspecified: Secondary | ICD-10-CM

## 2016-08-15 NOTE — Progress Notes (Signed)
Subjective:  Patient ID: [name], female    DOB: 05/16/1946  Age: 71 y.o. MRN: [id number]  CC: No chief complaint on file.   HPI KIARI DANZY presents for skin biopsy  Outpatient Medications Prior to Visit  Medication Sig Dispense Refill  . ALPRAZolam (XANAX) 0.5 MG tablet Take 1 tablet (0.5 mg total) by mouth 2 (two) times daily as needed. 60 tablet 3  . aspirin 81 MG tablet Take 81 mg by mouth daily.      Marland Kitchen buPROPion (WELLBUTRIN SR) 150 MG 12 hr tablet Take 1 tablet by mouth  twice a day 180 tablet 3  . butalbital-acetaminophen-caffeine (FIORICET WITH CODEINE) 50-325-40-30 MG per capsule Take 1 capsule by mouth every 6 (six) hours as needed for headache. 60 capsule 0  . Cholecalciferol (VITAMIN D) 2000 UNITS CAPS Take 1 capsule by mouth daily.      . diclofenac sodium (VOLTAREN) 1 % GEL Voltaren Gel 3 grams to 3 large joints upto TID 3 TUBES with 3 refills 10 Tube 2  . fish oil-omega-3 fatty acids 1000 MG capsule Take by mouth daily. Take  600 mg daily    . fluticasone (FLONASE) 50 MCG/ACT nasal spray Use 2 sprays in each  nostril daily 48 g 3  . gabapentin (NEURONTIN) 100 MG capsule Take 1 capsule by mouth at  bedtime and may repeat dose 1 time if needed 180 capsule 1  . loratadine (CLARITIN) 10 MG tablet Take 1 tablet (10 mg total) by mouth daily. 30 tablet 3  . lovastatin (MEVACOR) 40 MG tablet Take 1 tablet by mouth at  bedtime 90 tablet 2  . lubiprostone (AMITIZA) 24 MCG capsule Take 1 capsule (24 mcg total) by mouth 2 (two) times daily with a meal. 180 capsule 3  . Multiple Vitamins-Minerals (OCUVITE PO) Take 2 tablets by mouth daily.    Marland Kitchen omeprazole (PRILOSEC) 40 MG capsule TAKE 1 CAPSULE BY MOUTH  TWICE DAILY BEFORE MEALS 180 capsule 2  . Phosphatidylserine-DHA-EPA (VAYACOG) 100-19.5-6.5 MG CAPS 1 po qd 30 capsule 11  . Probiotic Product (ALIGN) 4 MG CAPS Take 1 capsule (4 mg total) by mouth daily. 30 capsule 1  . traZODone (DESYREL) 50 MG tablet Take 1 tablet (50 mg  total) by mouth at bedtime. 90 tablet 1  . verapamil (CALAN-SR) 240 MG CR tablet Take 1 tablet by mouth at  bedtime 90 tablet 2  . VESICARE 10 MG tablet     . vitamin B-12 (CYANOCOBALAMIN) 1000 MCG tablet Take 500 mcg by mouth daily.      Facility-Administered Medications Prior to Visit  Medication Dose Route Frequency Provider Last Rate Last Dose  . 0.9 %  sodium chloride infusion  500 mL Intravenous Continuous Irene Shipper, MD        ROS Review of Systems  Objective:  BP 130/78   Pulse 68   Temp 98.1 F (36.7 C)   Wt 204 lb (92.5 kg)   SpO2 98%   BMI 39.84 kg/m   BP Readings from Last 3 Encounters:  08/15/16 130/78  07/13/16 (!) 154/84  05/16/16 140/70    Wt Readings from Last 3 Encounters:  08/15/16 204 lb (92.5 kg)  07/13/16 207 lb (93.9 kg)  05/16/16 209 lb (94.8 kg)    Physical Exam Partially necrotic lip lesion   Procedure Note :     Procedure :  Skin biopsy   Indication:  Changing mole (s ),  Suspicious lesion(s)   Risks including  unsuccessful procedure , bleeding, infection, bruising, scar, a need for another complete procedure and others were explained to the patient in detail as well as the benefits. Informed consent was obtained and signed.   The patient was placed in a decubitus position.  Lesion #1 on  L upper lip measuring  6x3   mm   Skin over lesion #1  was prepped with Betadine and alcohol  and anesthetized with 1/2 cc of 2% lidocaine and epinephrine, using a 25-gauge 1 inch needle.  Shave biopsy with a sterile Dermablade was carried out in the usual fashion. Hyfrecator was used to destroy the rest of the lesion potentially left behind and for hemostasis. Band-Aid was applied with antibiotic ointment.      Assessment & Plan:   Diagnoses and all orders for this visit:  Skin lesion -     Dermatology pathology   I am having Ms. Kadar maintain her aspirin, vitamin B-12, Vitamin D, fish oil-omega-3 fatty acids, Multiple Vitamins-Minerals  (OCUVITE PO), loratadine, ALIGN, Phosphatidylserine-DHA-EPA, VESICARE, butalbital-acetaminophen-caffeine, gabapentin, lubiprostone, ALPRAZolam, fluticasone, buPROPion, verapamil, lovastatin, omeprazole, traZODone, and diclofenac sodium. We will continue to administer sodium chloride.  No orders of the defined types were placed in this encounter.    Follow-up: No Follow-up on file.  Walker Kehr, MD

## 2016-08-15 NOTE — Progress Notes (Signed)
Pre visit review using our clinic review tool, if applicable. No additional management support is needed unless otherwise documented below in the visit note. 

## 2016-08-17 ENCOUNTER — Ambulatory Visit
Admission: RE | Admit: 2016-08-17 | Discharge: 2016-08-17 | Disposition: A | Discharge status: Home / Self Care | Payer: Medicare Other | Source: Ambulatory Visit | Attending: Gynecology | Admitting: Gynecology

## 2016-08-17 DIAGNOSIS — Z1231 Encounter for screening mammogram for malignant neoplasm of breast: Secondary | ICD-10-CM

## 2016-08-20 ENCOUNTER — Encounter: Payer: Self-pay | Admitting: Internal Medicine

## 2016-08-20 NOTE — Progress Notes (Unsigned)
Skin, face and above lip bx, from [employer]

## 2016-08-22 ENCOUNTER — Encounter: Payer: Self-pay | Admitting: Internal Medicine

## 2016-08-22 ENCOUNTER — Telehealth: Payer: Self-pay | Admitting: Internal Medicine

## 2016-08-22 DIAGNOSIS — C4490 Unspecified malignant neoplasm of skin, unspecified: Secondary | ICD-10-CM

## 2016-08-22 NOTE — Telephone Encounter (Signed)
Pls inform the pt - skin bx shows skin cancer. I'll make a ref to kin [facility] fo the excision Thx

## 2016-08-23 NOTE — Telephone Encounter (Signed)
Advised patient of dr plotnikovs note/instructions---I have added patient's mobile number to referral note per patient request---she only will be available from her mobile number for the next several weeks

## 2016-09-04 ENCOUNTER — Ambulatory Visit (INDEPENDENT_AMBULATORY_CARE_PROVIDER_SITE_OTHER): Payer: Medicare Other | Admitting: Ophthalmology

## 2016-09-04 DIAGNOSIS — I10 Essential (primary) hypertension: Secondary | ICD-10-CM | POA: Diagnosis not present

## 2016-09-04 DIAGNOSIS — H353122 Nonexudative age-related macular degeneration, left eye, intermediate dry stage: Secondary | ICD-10-CM

## 2016-09-04 DIAGNOSIS — H353111 Nonexudative age-related macular degeneration, right eye, early dry stage: Secondary | ICD-10-CM | POA: Diagnosis not present

## 2016-09-04 DIAGNOSIS — H43813 Vitreous degeneration, bilateral: Secondary | ICD-10-CM

## 2016-09-04 DIAGNOSIS — H35033 Hypertensive retinopathy, bilateral: Secondary | ICD-10-CM | POA: Diagnosis not present

## 2016-09-18 DIAGNOSIS — H9011 Conductive hearing loss, unilateral, right ear, with unrestricted hearing on the contralateral side: Secondary | ICD-10-CM | POA: Insufficient documentation

## 2016-09-26 ENCOUNTER — Other Ambulatory Visit: Payer: Self-pay | Admitting: Internal Medicine

## 2016-10-06 ENCOUNTER — Other Ambulatory Visit: Payer: Self-pay | Admitting: Internal Medicine

## 2016-10-09 ENCOUNTER — Ambulatory Visit: Payer: Self-pay | Admitting: Otolaryngology

## 2016-10-09 NOTE — H&P (Signed)
HPI:   [name] is a 70 y.o. female who presents as a consult Patient.   Referring Provider: Plotnikov, Aleksei Vikt*  Chief complaint: Hearing loss.  HPI: She has noticed hearing loss on the right for years. It seems to be getting worse. About 8 months ago it got a lot worse. She has never had drainage or pain. No definite known history of trauma to the ear since childhood. She typically does not swim. Otherwise in pretty good health.  PMH/Meds/All/SocHx/FamHx/ROS:   Past Medical History:  Diagnosis Date  . Anxiety  . Arthritis  . GERD (gastroesophageal reflux disease)  . Hyperlipemia  . Hypertension   Past Surgical History:  Procedure Laterality Date  . APPENDECTOMY -10yrs  . BREAST LUMPECTOMY  . cataract surgery  . CESAREAN SECTION 1969  . CHOLECYSTECTOMY 2000  . HYSTERECTOMY 1993  . KNEE SURGERY  . TONSILLECTOMY  . TUBAL LIGATION 1985   No family history of bleeding disorders, wound healing problems or difficulty with anesthesia.   Social History   Social History  . Marital status: Married  Spouse name: N/A  . Number of children: N/A  . Years of education: N/A   Occupational History  . Not on file.   Social History Main Topics  . Smoking status: Never Smoker  . Smokeless tobacco: Never Used  . Alcohol use Not on file  . Drug use: Unknown  . Sexual activity: Not on file   Other Topics Concern  . Not on file   Social History Narrative  . No narrative on file   Current Outpatient Prescriptions:  . ALPRAZolam (XANAX) 0.5 MG tablet, Take 0.5 mg by mouth nightly as needed for Sleep., Disp: , Rfl:  . aspirin 81 MG EC tablet *ANTIPLATELET*, Take by mouth daily., Disp: , Rfl:  . Bifidobacterium infantis (ALIGN) 4 mg Cap, Take by mouth., Disp: , Rfl:  . buPROPion SR (WELLBUTRIN SR) 150 MG 12 hr tablet, Take 150 mg by mouth 2 times daily., Disp: , Rfl:  . cholecalciferol, vitamin D3, 2,000 unit Cap, Take 1 capsule by mouth., Disp: , Rfl:  .  codeine-butalbital-ASA-caff (BUTALBITAL-ASPIRIN-CAFFEINE-CODEINE) capsule, Take 1 capsule by mouth every 4 (four) hours as needed for Pain., Disp: , Rfl:  . cyanocobalamin (VITAMIN B12) 1000 MCG tablet, Take 500 mcg by mouth., Disp: , Rfl:  . diclofenac (VOLTAREN) 0.1 % ophthalmic solution, 1 drop 4 times daily., Disp: , Rfl:  . fluticasone (FLONASE) 50 mcg/actuation nasal spray, 2 sprays by Nasal route daily., Disp: , Rfl:  . gabapentin (NEURONTIN) 100 MG capsule, Take 100 mg by mouth 3 times daily., Disp: , Rfl:  . loratadine (CLARITIN) 10 mg tablet, Take by mouth., Disp: , Rfl:  . lovastatin (MEVACOR) 40 MG tablet, Take 40 mg by mouth nightly., Disp: , Rfl:  . lubiprostone (AMITIZA) 24 MCG capsule, Take 24 mcg by mouth 2 times daily with meals., Disp: , Rfl:  . MULTIVIT-MINERALS/FERROUS FUM (MULTI VITAMIN ORAL), Take 2 tablets by mouth., Disp: , Rfl:  . omega-3 acid ethyl esters (LOVAZA) 1 gram capsule, Take by mouth., Disp: , Rfl:  . omeprazole (PRILOSEC) 40 MG capsule, Take 40 mg by mouth daily., Disp: , Rfl:  . PHOS.SERINE/OMEGA-3/DHA/EPA (VAYACOG ORAL), 1 po qd, Disp: , Rfl:  . solifenacin (VESICARE) 10 MG tablet, Take 5 mg by mouth daily., Disp: , Rfl:  . traZODone (DESYREL) 50 MG tablet, Take 50 mg by mouth nightly., Disp: , Rfl:  . verapamil (CALAN-SR) 240 MG SR tablet, Take 240 mg by   mouth nightly., Disp: , Rfl:   A complete ROS was performed with pertinent positives/negatives noted in the HPI. The remainder of the ROS are negative.   Physical Exam:   Ht 1.524 m (5')  Wt 93 kg (205 lb)  BMI 40.04 kg/m   General: Healthy and alert, in no distress, breathing easily. Normal affect. In a pleasant mood. Head: Normocephalic, atraumatic. No masses, or scars. Eyes: Pupils are equal, and reactive to light. Vision is grossly intact. No spontaneous or gaze nystagmus. Ears: Ear canals are clear. Left tympanic membrane intact with a clear middle ear. Right tympanic membrane with a large  anterior perforation with clean edges. Middle ear looks healthy and dry. Hearing: Tuning fork lateralizes to the right. Nose: Nasal cavities are clear with healthy mucosa, no polyps or exudate.Airways are patent. Face: No masses or scars, facial nerve function is symmetric. Oral Cavity: No mucosal abnormalities are noted. Tongue with normal mobility. Dentition appears healthy. Oropharynx: Tonsils are symmetric. There are no mucosal masses identified. Tongue base appears normal and healthy. Larynx/Hypopharynx: deferred Chest: Deferred Neck: No palpable masses, no cervical adenopathy, no thyroid nodules or enlargement. Neuro: Cranial nerves II-XII will normal function. Balance: Normal gate. Other findings: none.  Independent Review of Additional Tests or Records:  Tympanogram is normal on the left. 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 Options were discussed including do nothing, hearing aid, tympanoplasty surgery. She has elected to undergo reconstructive surgery. We discussed the nature of the surgery, general anesthetic, outpatient, 80% chance of success. All questions were answered.      

## 2016-10-15 ENCOUNTER — Encounter (HOSPITAL_BASED_OUTPATIENT_CLINIC_OR_DEPARTMENT_OTHER): Payer: Self-pay | Admitting: *Deleted

## 2016-10-17 ENCOUNTER — Encounter (HOSPITAL_BASED_OUTPATIENT_CLINIC_OR_DEPARTMENT_OTHER)
Admission: RE | Admit: 2016-10-17 | Discharge: 2016-10-17 | Disposition: A | Discharge status: Home / Self Care | Payer: Medicare Other | Source: Ambulatory Visit | Attending: Otolaryngology | Admitting: Otolaryngology

## 2016-10-17 DIAGNOSIS — Z6841 Body Mass Index (BMI) 40.0 and over, adult: Secondary | ICD-10-CM | POA: Diagnosis not present

## 2016-10-17 DIAGNOSIS — F419 Anxiety disorder, unspecified: Secondary | ICD-10-CM | POA: Diagnosis not present

## 2016-10-17 DIAGNOSIS — F329 Major depressive disorder, single episode, unspecified: Secondary | ICD-10-CM | POA: Diagnosis not present

## 2016-10-17 DIAGNOSIS — H7291 Unspecified perforation of tympanic membrane, right ear: Secondary | ICD-10-CM | POA: Diagnosis present

## 2016-10-17 DIAGNOSIS — Z7951 Long term (current) use of inhaled steroids: Secondary | ICD-10-CM | POA: Diagnosis not present

## 2016-10-17 DIAGNOSIS — Z79899 Other long term (current) drug therapy: Secondary | ICD-10-CM | POA: Diagnosis not present

## 2016-10-17 DIAGNOSIS — M797 Fibromyalgia: Secondary | ICD-10-CM | POA: Diagnosis not present

## 2016-10-17 DIAGNOSIS — E785 Hyperlipidemia, unspecified: Secondary | ICD-10-CM | POA: Diagnosis not present

## 2016-10-17 DIAGNOSIS — I10 Essential (primary) hypertension: Secondary | ICD-10-CM | POA: Diagnosis not present

## 2016-10-17 DIAGNOSIS — Z7982 Long term (current) use of aspirin: Secondary | ICD-10-CM | POA: Diagnosis not present

## 2016-10-17 DIAGNOSIS — K219 Gastro-esophageal reflux disease without esophagitis: Secondary | ICD-10-CM | POA: Diagnosis not present

## 2016-10-17 DIAGNOSIS — H902 Conductive hearing loss, unspecified: Secondary | ICD-10-CM | POA: Diagnosis not present

## 2016-10-22 ENCOUNTER — Encounter (HOSPITAL_BASED_OUTPATIENT_CLINIC_OR_DEPARTMENT_OTHER): Payer: Self-pay | Admitting: Certified Registered"

## 2016-10-22 ENCOUNTER — Ambulatory Visit (HOSPITAL_BASED_OUTPATIENT_CLINIC_OR_DEPARTMENT_OTHER): Payer: Medicare Other | Admitting: Certified Registered"

## 2016-10-22 ENCOUNTER — Ambulatory Visit (HOSPITAL_BASED_OUTPATIENT_CLINIC_OR_DEPARTMENT_OTHER)
Admission: RE | Admit: 2016-10-22 | Discharge: 2016-10-22 | Disposition: A | Discharge status: Home / Self Care | Payer: Medicare Other | Source: Ambulatory Visit | Attending: Otolaryngology | Admitting: Otolaryngology

## 2016-10-22 ENCOUNTER — Encounter (HOSPITAL_BASED_OUTPATIENT_CLINIC_OR_DEPARTMENT_OTHER)
Admission: RE | Disposition: A | Discharge status: Home / Self Care | Payer: Self-pay | Source: Ambulatory Visit | Attending: Otolaryngology

## 2016-10-22 DIAGNOSIS — K219 Gastro-esophageal reflux disease without esophagitis: Secondary | ICD-10-CM | POA: Diagnosis not present

## 2016-10-22 DIAGNOSIS — H7291 Unspecified perforation of tympanic membrane, right ear: Secondary | ICD-10-CM | POA: Insufficient documentation

## 2016-10-22 DIAGNOSIS — F419 Anxiety disorder, unspecified: Secondary | ICD-10-CM | POA: Diagnosis not present

## 2016-10-22 DIAGNOSIS — F329 Major depressive disorder, single episode, unspecified: Secondary | ICD-10-CM | POA: Insufficient documentation

## 2016-10-22 DIAGNOSIS — H902 Conductive hearing loss, unspecified: Secondary | ICD-10-CM | POA: Diagnosis not present

## 2016-10-22 DIAGNOSIS — M797 Fibromyalgia: Secondary | ICD-10-CM | POA: Insufficient documentation

## 2016-10-22 DIAGNOSIS — Z6841 Body Mass Index (BMI) 40.0 and over, adult: Secondary | ICD-10-CM | POA: Insufficient documentation

## 2016-10-22 DIAGNOSIS — Z7951 Long term (current) use of inhaled steroids: Secondary | ICD-10-CM | POA: Insufficient documentation

## 2016-10-22 DIAGNOSIS — I10 Essential (primary) hypertension: Secondary | ICD-10-CM | POA: Insufficient documentation

## 2016-10-22 DIAGNOSIS — Z7982 Long term (current) use of aspirin: Secondary | ICD-10-CM | POA: Insufficient documentation

## 2016-10-22 DIAGNOSIS — E785 Hyperlipidemia, unspecified: Secondary | ICD-10-CM | POA: Insufficient documentation

## 2016-10-22 DIAGNOSIS — Z79899 Other long term (current) drug therapy: Secondary | ICD-10-CM | POA: Insufficient documentation

## 2016-10-22 HISTORY — PX: TYMPANOPLASTY: SHX33

## 2016-10-22 HISTORY — DX: Nausea with vomiting, unspecified: R11.2

## 2016-10-22 HISTORY — DX: Nausea with vomiting, unspecified: Z98.890

## 2016-10-22 SURGERY — TYMPANOPLASTY
Anesthesia: General | Site: Ear | Laterality: Right

## 2016-10-22 MED ORDER — PROPOFOL 500 MG/50ML IV EMUL
INTRAVENOUS | Status: DC | PRN
  Administered 2016-10-22: 150 ug/kg/min via INTRAVENOUS

## 2016-10-22 MED ORDER — ONDANSETRON HCL 4 MG/2ML IJ SOLN
INTRAMUSCULAR | Status: AC
  Filled 2016-10-22: qty 2

## 2016-10-22 MED ORDER — CIPROFLOXACIN-DEXAMETHASONE 0.3-0.1 % OT SUSP
OTIC | Status: DC | PRN
  Administered 2016-10-22: 20 [drp] via OTIC

## 2016-10-22 MED ORDER — OXYCODONE HCL 5 MG PO TABS
5.0000 mg | ORAL_TABLET | Freq: Once | ORAL | Status: AC | PRN
  Administered 2016-10-22: 5 mg via ORAL

## 2016-10-22 MED ORDER — SCOPOLAMINE 1 MG/3DAYS TD PT72
1.0000 | MEDICATED_PATCH | Freq: Once | TRANSDERMAL | Status: DC | PRN
  Administered 2016-10-22: 1.5 mg via TRANSDERMAL

## 2016-10-22 MED ORDER — LIDOCAINE-EPINEPHRINE 1 %-1:100000 IJ SOLN
INTRAMUSCULAR | Status: DC | PRN
  Administered 2016-10-22: 3 mL

## 2016-10-22 MED ORDER — PROPOFOL 10 MG/ML IV BOLUS
INTRAVENOUS | Status: AC
  Filled 2016-10-22: qty 20

## 2016-10-22 MED ORDER — OXYCODONE HCL 5 MG/5ML PO SOLN: 5.0000 mg | Freq: Once | ORAL | Status: AC | PRN

## 2016-10-22 MED ORDER — CIPROFLOXACIN-FLUOCINOLONE PF 0.3-0.025 % OT SOLN: 0.2500 mL | Freq: Two times a day (BID) | OTIC | 0 refills | Status: AC

## 2016-10-22 MED ORDER — SUGAMMADEX SODIUM 200 MG/2ML IV SOLN
INTRAVENOUS | Status: DC | PRN
  Administered 2016-10-22: 200 mg via INTRAVENOUS

## 2016-10-22 MED ORDER — PHENYLEPHRINE HCL 10 MG/ML IJ SOLN
INTRAMUSCULAR | Status: DC | PRN
  Administered 2016-10-22 (×2): 80 ug via INTRAVENOUS

## 2016-10-22 MED ORDER — PHENYLEPHRINE HCL 10 MG/ML IJ SOLN
INTRAMUSCULAR | Status: AC
Start: 2016-10-22 — End: 2016-10-22
  Filled 2016-10-22: qty 1

## 2016-10-22 MED ORDER — BACITRACIN ZINC 500 UNIT/GM EX OINT
TOPICAL_OINTMENT | CUTANEOUS | Status: AC
Start: 2016-10-22 — End: 2016-10-22
  Filled 2016-10-22: qty 28.35

## 2016-10-22 MED ORDER — CIPROFLOXACIN-DEXAMETHASONE 0.3-0.1 % OT SUSP
OTIC | Status: AC
  Filled 2016-10-22: qty 7.5

## 2016-10-22 MED ORDER — METHYLENE BLUE 0.5 % INJ SOLN
INTRAVENOUS | Status: DC | PRN
  Administered 2016-10-22: 10:00:00 via TOPICAL

## 2016-10-22 MED ORDER — PHENYLEPHRINE HCL 10 MG/ML IJ SOLN
INTRAMUSCULAR | Status: DC | PRN
  Administered 2016-10-22: 50 ug/min via INTRAVENOUS

## 2016-10-22 MED ORDER — MIDAZOLAM HCL 2 MG/2ML IJ SOLN
INTRAMUSCULAR | Status: AC
  Filled 2016-10-22: qty 2

## 2016-10-22 MED ORDER — ROCURONIUM BROMIDE 100 MG/10ML IV SOLN
INTRAVENOUS | Status: DC | PRN
  Administered 2016-10-22 (×2): 20 mg via INTRAVENOUS

## 2016-10-22 MED ORDER — BACITRACIN ZINC 500 UNIT/GM EX OINT
TOPICAL_OINTMENT | CUTANEOUS | Status: DC | PRN
  Administered 2016-10-22: 1 via TOPICAL

## 2016-10-22 MED ORDER — OXYCODONE HCL 5 MG PO TABS
ORAL_TABLET | ORAL | Status: AC
  Filled 2016-10-22: qty 1

## 2016-10-22 MED ORDER — MIDAZOLAM HCL 2 MG/2ML IJ SOLN
1.0000 mg | INTRAMUSCULAR | Status: DC | PRN
  Administered 2016-10-22: 1 mg via INTRAVENOUS

## 2016-10-22 MED ORDER — DEXAMETHASONE SODIUM PHOSPHATE 4 MG/ML IJ SOLN
INTRAMUSCULAR | Status: DC | PRN
  Administered 2016-10-22: 10 mg via INTRAVENOUS

## 2016-10-22 MED ORDER — SODIUM BICARBONATE 4 % IV SOLN
INTRAVENOUS | Status: AC
  Filled 2016-10-22: qty 5

## 2016-10-22 MED ORDER — FENTANYL CITRATE (PF) 100 MCG/2ML IJ SOLN
50.0000 ug | INTRAMUSCULAR | Status: DC | PRN
  Administered 2016-10-22: 100 ug via INTRAVENOUS

## 2016-10-22 MED ORDER — BACITRACIN ZINC 500 UNIT/GM EX OINT
TOPICAL_OINTMENT | CUTANEOUS | Status: AC
  Filled 2016-10-22: qty 28.35

## 2016-10-22 MED ORDER — FENTANYL CITRATE (PF) 100 MCG/2ML IJ SOLN
25.0000 ug | INTRAMUSCULAR | Status: DC | PRN
  Administered 2016-10-22: 25 ug via INTRAVENOUS
  Administered 2016-10-22: 50 ug via INTRAVENOUS

## 2016-10-22 MED ORDER — SUCCINYLCHOLINE CHLORIDE 20 MG/ML IJ SOLN
INTRAMUSCULAR | Status: DC | PRN
  Administered 2016-10-22: 100 mg via INTRAVENOUS

## 2016-10-22 MED ORDER — LIDOCAINE 2% (20 MG/ML) 5 ML SYRINGE
INTRAMUSCULAR | Status: DC | PRN
  Administered 2016-10-22: 60 mg via INTRAVENOUS

## 2016-10-22 MED ORDER — LIDOCAINE-EPINEPHRINE (PF) 1 %-1:200000 IJ SOLN
INTRAMUSCULAR | Status: AC
  Filled 2016-10-22: qty 30

## 2016-10-22 MED ORDER — SCOPOLAMINE 1 MG/3DAYS TD PT72
MEDICATED_PATCH | TRANSDERMAL | Status: AC
  Filled 2016-10-22: qty 1

## 2016-10-22 MED ORDER — CIPROFLOXACIN-DEXAMETHASONE 0.3-0.1 % OT SUSP: 3.0000 [drp] | Freq: Three times a day (TID) | OTIC | 2 refills | Status: AC

## 2016-10-22 MED ORDER — EPINEPHRINE 30 MG/30ML IJ SOLN
INTRAMUSCULAR | Status: AC
Start: 2016-10-22 — End: 2016-10-22
  Filled 2016-10-22: qty 1

## 2016-10-22 MED ORDER — PROMETHAZINE HCL 25 MG RE SUPP: 25.0000 mg | Freq: Four times a day (QID) | RECTAL | 1 refills | Status: DC | PRN

## 2016-10-22 MED ORDER — ONDANSETRON HCL 4 MG/2ML IJ SOLN
INTRAMUSCULAR | Status: DC | PRN
  Administered 2016-10-22: 4 mg via INTRAVENOUS

## 2016-10-22 MED ORDER — SUGAMMADEX SODIUM 200 MG/2ML IV SOLN
INTRAVENOUS | Status: AC
Start: 2016-10-22 — End: 2016-10-22
  Filled 2016-10-22: qty 2

## 2016-10-22 MED ORDER — PROPOFOL 500 MG/50ML IV EMUL
INTRAVENOUS | Status: AC
  Filled 2016-10-22: qty 100

## 2016-10-22 MED ORDER — ROCURONIUM BROMIDE 10 MG/ML (PF) SYRINGE
PREFILLED_SYRINGE | INTRAVENOUS | Status: AC
  Filled 2016-10-22: qty 5

## 2016-10-22 MED ORDER — PROPOFOL 10 MG/ML IV BOLUS
INTRAVENOUS | Status: DC | PRN
  Administered 2016-10-22: 20 mg via INTRAVENOUS
  Administered 2016-10-22: 200 mg via INTRAVENOUS
  Administered 2016-10-22 (×2): 30 mg via INTRAVENOUS

## 2016-10-22 MED ORDER — FENTANYL CITRATE (PF) 100 MCG/2ML IJ SOLN
INTRAMUSCULAR | Status: AC
  Filled 2016-10-22: qty 2

## 2016-10-22 MED ORDER — DEXAMETHASONE SODIUM PHOSPHATE 10 MG/ML IJ SOLN
INTRAMUSCULAR | Status: AC
  Filled 2016-10-22: qty 1

## 2016-10-22 MED ORDER — LIDOCAINE-EPINEPHRINE 1 %-1:100000 IJ SOLN
INTRAMUSCULAR | Status: AC
Start: 2016-10-22 — End: 2016-10-22
  Filled 2016-10-22: qty 1

## 2016-10-22 MED ORDER — ARTIFICIAL TEARS OPHTHALMIC OINT
TOPICAL_OINTMENT | OPHTHALMIC | Status: AC
  Filled 2016-10-22: qty 3.5

## 2016-10-22 MED ORDER — LACTATED RINGERS IV SOLN
INTRAVENOUS | Status: DC
  Administered 2016-10-22 (×2): via INTRAVENOUS

## 2016-10-22 MED ORDER — LIDOCAINE 2% (20 MG/ML) 5 ML SYRINGE
INTRAMUSCULAR | Status: AC
  Filled 2016-10-22: qty 5

## 2016-10-22 MED ORDER — ONDANSETRON HCL 4 MG/2ML IJ SOLN: 4.0000 mg | Freq: Four times a day (QID) | INTRAMUSCULAR | Status: DC | PRN

## 2016-10-22 MED ORDER — HYDROCODONE-ACETAMINOPHEN 7.5-325 MG PO TABS: 1.0000 | ORAL_TABLET | Freq: Four times a day (QID) | ORAL | 0 refills | Status: DC | PRN

## 2016-10-22 MED ORDER — METHYLENE BLUE 0.5 % INJ SOLN
INTRAVENOUS | Status: AC
  Filled 2016-10-22: qty 10

## 2016-10-22 SURGICAL SUPPLY — 67 items
ADH SKN CLS APL DERMABOND .7 (GAUZE/BANDAGES/DRESSINGS)
APL SKNCLS STERI-STRIP NONHPOA (GAUZE/BANDAGES/DRESSINGS)
BALL CTTN LRG ABS STRL LF (GAUZE/BANDAGES/DRESSINGS) ×1
BANDAGE GAUZE 4  KLING STR (GAUZE/BANDAGES/DRESSINGS) IMPLANT
BENZOIN TINCTURE PRP APPL 2/3 (GAUZE/BANDAGES/DRESSINGS) IMPLANT
BLADE CLIPPER SURG (BLADE) IMPLANT
BLADE NDL 3 SS STRL (BLADE) IMPLANT
BLADE NEEDLE 3 SS STRL (BLADE) IMPLANT
BLADE NEEDLE 3MM SS STRL (BLADE)
BNDG GAUZE ELAST 4 BULKY (GAUZE/BANDAGES/DRESSINGS) IMPLANT
CANISTER SUCT 1200ML W/VALVE (MISCELLANEOUS) ×3 IMPLANT
CLEANER CAUTERY TIP 5X5 PAD (MISCELLANEOUS) ×1 IMPLANT
CLOSURE WOUND 1/2 X4 (GAUZE/BANDAGES/DRESSINGS)
COTTONBALL LRG STERILE PKG (GAUZE/BANDAGES/DRESSINGS) ×3 IMPLANT
DECANTER SPIKE VIAL GLASS SM (MISCELLANEOUS) ×3 IMPLANT
DERMABOND ADVANCED (GAUZE/BANDAGES/DRESSINGS)
DERMABOND ADVANCED .7 DNX12 (GAUZE/BANDAGES/DRESSINGS) IMPLANT
DRAPE EENT ADH APERT 31X51 STR (DRAPES) ×3 IMPLANT
DRAPE INCISE 23X17 IOBAN STRL (DRAPES)
DRAPE INCISE 23X17 STRL (DRAPES) IMPLANT
DRAPE INCISE IOBAN 23X17 STRL (DRAPES) IMPLANT
DRAPE MICROSCOPE URBAN (DRAPES) IMPLANT
DRAPE MICROSCOPE WILD 40.5X102 (DRAPES) IMPLANT
DROPPER MEDICINE STER 1.5ML LF (MISCELLANEOUS) IMPLANT
DRSG GLASSCOCK MASTOID ADT (GAUZE/BANDAGES/DRESSINGS) IMPLANT
DRSG GLASSCOCK MASTOID PED (GAUZE/BANDAGES/DRESSINGS) ×2 IMPLANT
DRSG TELFA 3X8 NADH (GAUZE/BANDAGES/DRESSINGS) IMPLANT
ELECT COATED BLADE 2.86 ST (ELECTRODE) ×3 IMPLANT
ELECT REM PT RETURN 9FT ADLT (ELECTROSURGICAL) ×3
ELECTRODE REM PT RTRN 9FT ADLT (ELECTROSURGICAL) ×1 IMPLANT
GAUZE SPONGE 4X4 12PLY STRL (GAUZE/BANDAGES/DRESSINGS) IMPLANT
GAUZE SPONGE 4X4 12PLY STRL LF (GAUZE/BANDAGES/DRESSINGS) IMPLANT
GAUZE SPONGE 4X4 16PLY XRAY LF (GAUZE/BANDAGES/DRESSINGS) IMPLANT
GLOVE ECLIPSE 7.5 STRL STRAW (GLOVE) ×3 IMPLANT
GLOVE SURG SS PI 7.0 STRL IVOR (GLOVE) ×2 IMPLANT
GOWN STRL REUS W/ TWL LRG LVL3 (GOWN DISPOSABLE) ×2 IMPLANT
GOWN STRL REUS W/ TWL XL LVL3 (GOWN DISPOSABLE) ×1 IMPLANT
GOWN STRL REUS W/TWL LRG LVL3 (GOWN DISPOSABLE) ×6
GOWN STRL REUS W/TWL XL LVL3 (GOWN DISPOSABLE) ×3
IV CATH AUTO 14GX1.75 SAFE ORG (IV SOLUTION) IMPLANT
IV SET EXT 30 76VOL 4 MALE LL (IV SETS) ×3 IMPLANT
NDL PRECISIONGLIDE 27X1.5 (NEEDLE) ×1 IMPLANT
NDL SAFETY ECLIPSE 18X1.5 (NEEDLE) ×1 IMPLANT
NEEDLE HYPO 18GX1.5 SHARP (NEEDLE) ×3
NEEDLE PRECISIONGLIDE 27X1.5 (NEEDLE) ×3 IMPLANT
NS IRRIG 1000ML POUR BTL (IV SOLUTION) ×3 IMPLANT
PACK BASIN DAY SURGERY FS (CUSTOM PROCEDURE TRAY) ×3 IMPLANT
PACK ENT DAY SURGERY (CUSTOM PROCEDURE TRAY) ×3 IMPLANT
PAD CLEANER CAUTERY TIP 5X5 (MISCELLANEOUS) ×2
PAD DRESSING TELFA 3X8 NADH (GAUZE/BANDAGES/DRESSINGS) IMPLANT
PENCIL FOOT CONTROL (ELECTRODE) ×3 IMPLANT
SHEET MEDIUM DRAPE 40X70 STRL (DRAPES) IMPLANT
SLEEVE SCD COMPRESS KNEE MED (MISCELLANEOUS) IMPLANT
SPONGE SURGIFOAM ABS GEL 12-7 (HEMOSTASIS) IMPLANT
STRIP CLOSURE SKIN 1/2X4 (GAUZE/BANDAGES/DRESSINGS) IMPLANT
SUT CHROMIC 3 0 PS 2 (SUTURE) IMPLANT
SUT CHROMIC 4 0 P 3 18 (SUTURE) IMPLANT
SUT CHROMIC 4 0 PS 2 18 (SUTURE) IMPLANT
SUT ETHILON 5 0 P 3 18 (SUTURE)
SUT NYLON ETHILON 5-0 P-3 1X18 (SUTURE) IMPLANT
SUT PLAIN 5 0 P 3 18 (SUTURE) IMPLANT
SUT VIC AB 3-0 FS2 27 (SUTURE) IMPLANT
SYR 5ML LL (SYRINGE) IMPLANT
SYR BULB 3OZ (MISCELLANEOUS) IMPLANT
TOWEL OR 17X24 6PK STRL BLUE (TOWEL DISPOSABLE) ×3 IMPLANT
TRAY DSU PREP LF (CUSTOM PROCEDURE TRAY) ×3 IMPLANT
TUBING IRRIGATION (MISCELLANEOUS) IMPLANT

## 2016-10-22 NOTE — Op Note (Signed)
OPERATIVE REPORT  DATE OF SURGERY: 10/22/2016  PATIENT:  [name],  71 y.o. female  PRE-OPERATIVE DIAGNOSIS:  RIGHT TYMPANIC MEMBRANE PERFORATION  POST-OPERATIVE DIAGNOSIS:  RIGHT TYMPANIC MEMBRANE PERFORATION  PROCEDURE:  Procedure(s): RIGHT TYMPANOPLASTY  SURGEON:  Beckie Salts, MD  ASSISTANTS: none  ANESTHESIA:   General   EBL:  30 ml  DRAINS: none  LOCAL MEDICATIONS USED:  1% Xylocaine with epinephrine  SPECIMEN:  none  COUNTS:  Correct  PROCEDURE DETAILS: The patient was taken to the operating room and placed on the operating table in the supine position. Following induction of general endotracheal anesthesia, the right ear was prepped and draped in a standard fashion. Using the operating microscope, the ear canal was infiltrated with local anesthetic solution in 4 quadrants. Postauricular sulcus was also infiltrated.  Radial incisions were created at 4:00 and 8:00. These were connected lateral to the annulus using a round knife. A vascular strip was elevated. The postauricular incision was created using electrocautery. Dissection down to the mastoid periosteum was accomplished. A temporalis fascia graft was harvested pressed and dried on the back table. The linea temporalis and mastoid periosteum were incised and the ear was brought forward and secured in place with a Perkins retractor.  The eardrum was then inspected. There is a large anterior perforation starting at the malleus and ending at the anterior annulus from top to bottom. The edges were removed using a sharp pick and forceps. The annulus was dissected off of the bone and elevated using a Adna Nofziger pick. Epithelium was dissected off of the manubrium of the malleus. A posterior tympanomeatal flap was brought forward. The posterior tympanic membrane was very thin and tore resulting in a near total perforation. There is 1 very limited area of retraction and epithelium in the posterior superior quadrant without any bone  erosion. This was cleaned out. The ossicular chain looked healthy and was intact. The middle ear looked healthy otherwise.  The middle ear was packed with saline soaked Gelfoam including the eustachian tube area. The graft was trimmed and notched for the malleus and then placed in an underlay technique. Gelfoam was used to support the graft. The annulus was replaced down onto the graft anteriorly. The posterior tympanomeatal flap was then brought back on top of the graft as well. The ear canal was packed with Ciprodex-soaked Gelfoam.  The mastoid periosteum was reapproximated with 3-0 chromic suture. A running 3-0 chromic subcuticular closure was accomplished on the postauricular incision. Dermabond was applied. The ear canal was inspected and additional Gelfoam was used to support the vascular strip in its native position. Cotton ball with bacitracin was applied. A Glasscock dressing was applied. Patient was awakened extubated and transferred to recovery in stable condition.    PATIENT DISPOSITION:  To PACU, stable

## 2016-10-22 NOTE — Transfer of Care (Signed)
Immediate Anesthesia Transfer of Care Note  Patient: [name]  Procedure(s) Performed: Procedure(s): RIGHT TYMPANOPLASTY (Right)  Patient Location: PACU  Anesthesia Type:General  Level of Consciousness: awake, alert , oriented and patient cooperative  Airway & Oxygen Therapy: Patient Spontanous Breathing and Patient connected to face mask oxygen  Post-op Assessment: Report given to RN, Post -op Vital signs reviewed and stable and Patient moving all extremities  Post vital signs: Reviewed and stable  Last Vitals:  Vitals:   10/22/16 0738 10/22/16 1038  BP: 138/61 138/83  Pulse: 80 92  Resp: 18 (P) 16  Temp: 36.7 C (P) 36.8 C    Last Pain:  Vitals:   10/22/16 0738  TempSrc: Oral         Complications: No apparent anesthesia complications

## 2016-10-22 NOTE — Anesthesia Preprocedure Evaluation (Signed)
Anesthesia Evaluation  Patient identified by MRN, date of birth, ID band Patient awake    Reviewed: Allergy & Precautions, H&P , NPO status , Patient's Chart, lab work & pertinent test results  History of Anesthesia Complications (+) PONV and history of anesthetic complications  Airway Mallampati: II   Neck ROM: full    Dental   Pulmonary neg pulmonary ROS,    breath sounds clear to auscultation       Cardiovascular hypertension,  Rhythm:regular Rate:Normal     Neuro/Psych  Headaches, PSYCHIATRIC DISORDERS Anxiety Depression    GI/Hepatic PUD, GERD  ,  Endo/Other  Morbid obesity  Renal/GU      Musculoskeletal  (+) Arthritis , Fibromyalgia -  Abdominal   Peds  Hematology   Anesthesia Other Findings   Reproductive/Obstetrics                             Anesthesia Physical Anesthesia Plan  ASA: III  Anesthesia Plan: General   Post-op Pain Management:    Induction: Intravenous  Airway Management Planned: Oral ETT  Additional Equipment:   Intra-op Plan:   Post-operative Plan: Extubation in OR  Informed Consent: I have reviewed the patients History and Physical, chart, labs and discussed the procedure including the risks, benefits and alternatives for the proposed anesthesia with the patient or authorized representative who has indicated his/her understanding and acceptance.     Plan Discussed with: CRNA, Anesthesiologist and Surgeon  Anesthesia Plan Comments:         Anesthesia Quick Evaluation

## 2016-10-22 NOTE — Anesthesia Postprocedure Evaluation (Signed)
Anesthesia Post Note  Patient: [name]  Procedure(s) Performed: Procedure(s) (LRB): RIGHT TYMPANOPLASTY (Right)  Patient location during evaluation: PACU Anesthesia Type: General Level of consciousness: awake and alert and patient cooperative Pain management: pain level controlled Vital Signs Assessment: post-procedure vital signs reviewed and stable Respiratory status: spontaneous breathing and respiratory function stable Cardiovascular status: stable Anesthetic complications: no       Last Vitals:  Vitals:   10/22/16 1130 10/22/16 1204  BP: (!) 145/80 (!) 153/61  Pulse: 73 78  Resp: 15 18  Temp:  36.3 C    Last Pain:  Vitals:   10/22/16 1204  TempSrc:   PainSc: [place]

## 2016-10-22 NOTE — Anesthesia Procedure Notes (Signed)
Procedure Name: Intubation Date/Time: 10/22/2016 9:21 AM Performed by: Baxter Flattery Pre-anesthesia Checklist: Patient identified, Emergency Drugs available, Suction available and Patient being monitored Patient Re-evaluated:Patient Re-evaluated prior to inductionOxygen Delivery Method: Circle system utilized Preoxygenation: Pre-oxygenation with 100% oxygen Intubation Type: IV induction Ventilation: Mask ventilation without difficulty Laryngoscope Size: Miller and 2 Grade View: Grade I Tube type: Oral Number of attempts: 1 Airway Equipment and Method: Stylet and Oral airway Placement Confirmation: ETT inserted through vocal cords under direct vision,  positive ETCO2 and breath sounds checked- equal and bilateral Secured at: 21 cm Tube secured with: Tape Dental Injury: Teeth and Oropharynx as per pre-operative assessment

## 2016-10-22 NOTE — Interval H&P Note (Signed)
History and Physical Interval Note:  10/22/2016 8:50 AM  [name]  has presented today for surgery, with the diagnosis of RIGHT TYMPANIC MEMBRANE PERFORATION  The various methods of treatment have been discussed with the patient and family. After consideration of risks, benefits and other options for treatment, the patient has consented to  Procedure(s): RIGHT TYMPANOPLASTY (Right) as a surgical intervention .  The patient's history has been reviewed, patient examined, no change in status, stable for surgery.  I have reviewed the patient's chart and labs.  Questions were answered to the patient's satisfaction.     Nykira Reddix

## 2016-10-22 NOTE — Discharge Instructions (Signed)
On the morning after discharge, it is okay to remove the dressing. Undue the Velcro strap on the forehead. Then remove the small adhesive pad from the forehead. The entire dressing should then come off easily. There is a thin piece of gauze behind the ear that can be removed. Remove the cotton ball from inside the ear, place 3 of the antibiotic drops and then replace the cotton ball.  Repeat the drops and cotton ball 3 times daily. If you pull out the cotton ball and something is stuck to it that looks like flesh, do not worry it is not flesh it is simply packing material. You can discard  it.  : Avoid blowing the nose, open-mouth when sneezing, no bending, straining, lifting. Keep everything else out of the ear  There are 2 prescriptions for eardrops. One is for Ciprodex which is the preferred one. If it is going to be too expensive or if it is not permitted by your insurance then go ahead and fill the other one which is called Lucia Gaskins, which can be purchased at [facility] at a significant discount.       Post Anesthesia Home Care Instructions  Activity: Get plenty of rest for the remainder of the day. A responsible individual must stay with you for 24 hours following the procedure.  For the next 24 hours, DO NOT: -Drive a car -Paediatric nurse -Drink alcoholic beverages -Take any medication unless instructed by your physician -Make any legal decisions or sign important papers.  Meals: Start with liquid foods such as gelatin or soup. Progress to regular foods as tolerated. Avoid greasy, spicy, heavy foods. If nausea and/or vomiting occur, drink only clear liquids until the nausea and/or vomiting subsides. Call your physician if vomiting continues.  Special Instructions/Symptoms: Your throat may feel dry or sore from the anesthesia or the breathing tube placed in your throat during surgery. If this causes discomfort, gargle with warm salt water. The discomfort should disappear  within 24 hours.  If you had a scopolamine patch placed behind your ear for the management of post- operative nausea and/or vomiting:  1. The medication in the patch is effective for 72 hours, after which it should be removed.  Wrap patch in a tissue and discard in the trash. Wash hands thoroughly with soap and water. 2. You may remove the patch earlier than 72 hours if you experience unpleasant side effects which may include dry mouth, dizziness or visual disturbances. 3. Avoid touching the patch. Wash your hands with soap and water after contact with the patch.

## 2016-10-22 NOTE — H&P (View-Only) (Signed)
HPI:   [name] is a 71 y.o. female who presents as a consult Patient.   Referring Provider: Plotnikov, Aleksei Vikt*  Chief complaint: Hearing loss.  HPI: She has noticed hearing loss on the right for years. It seems to be getting worse. About 8 months ago it got a lot worse. She has never had drainage or pain. No definite known history of trauma to the ear since childhood. She typically does not swim. Otherwise in pretty good health.  PMH/Meds/All/SocHx/FamHx/ROS:   Past Medical History:  Diagnosis Date  . Anxiety  . Arthritis  . GERD (gastroesophageal reflux disease)  . Hyperlipemia  . Hypertension   Past Surgical History:  Procedure Laterality Date  . APPENDECTOMY -10yrs  . BREAST LUMPECTOMY  . cataract surgery  . CESAREAN SECTION 1969  . CHOLECYSTECTOMY 2000  . HYSTERECTOMY 1993  . KNEE SURGERY  . TONSILLECTOMY  . TUBAL LIGATION 1985   No family history of bleeding disorders, wound healing problems or difficulty with anesthesia.   Social History   Social History  . Marital status: Married  Spouse name: N/A  . Number of children: N/A  . Years of education: N/A   Occupational History  . Not on file.   Social History Main Topics  . Smoking status: Never Smoker  . Smokeless tobacco: Never Used  . Alcohol use Not on file  . Drug use: Unknown  . Sexual activity: Not on file   Other Topics Concern  . Not on file   Social History Narrative  . No narrative on file   Current Outpatient Prescriptions:  . ALPRAZolam (XANAX) 0.5 MG tablet, Take 0.5 mg by mouth nightly as needed for Sleep., Disp: , Rfl:  . aspirin 81 MG EC tablet *ANTIPLATELET*, Take by mouth daily., Disp: , Rfl:  . Bifidobacterium infantis (ALIGN) 4 mg Cap, Take by mouth., Disp: , Rfl:  . buPROPion SR (WELLBUTRIN SR) 150 MG 12 hr tablet, Take 150 mg by mouth 2 times daily., Disp: , Rfl:  . cholecalciferol, vitamin D3, 2,000 unit Cap, Take 1 capsule by mouth., Disp: , Rfl:  .  codeine-butalbital-ASA-caff (BUTALBITAL-ASPIRIN-CAFFEINE-CODEINE) capsule, Take 1 capsule by mouth every 4 (four) hours as needed for Pain., Disp: , Rfl:  . cyanocobalamin (VITAMIN B12) 1000 MCG tablet, Take 500 mcg by mouth., Disp: , Rfl:  . diclofenac (VOLTAREN) 0.1 % ophthalmic solution, 1 drop 4 times daily., Disp: , Rfl:  . fluticasone (FLONASE) 50 mcg/actuation nasal spray, 2 sprays by Nasal route daily., Disp: , Rfl:  . gabapentin (NEURONTIN) 100 MG capsule, Take 100 mg by mouth 3 times daily., Disp: , Rfl:  . loratadine (CLARITIN) 10 mg tablet, Take by mouth., Disp: , Rfl:  . lovastatin (MEVACOR) 40 MG tablet, Take 40 mg by mouth nightly., Disp: , Rfl:  . lubiprostone (AMITIZA) 24 MCG capsule, Take 24 mcg by mouth 2 times daily with meals., Disp: , Rfl:  . MULTIVIT-MINERALS/FERROUS FUM (MULTI VITAMIN ORAL), Take 2 tablets by mouth., Disp: , Rfl:  . omega-3 acid ethyl esters (LOVAZA) 1 gram capsule, Take by mouth., Disp: , Rfl:  . omeprazole (PRILOSEC) 40 MG capsule, Take 40 mg by mouth daily., Disp: , Rfl:  . PHOS.SERINE/OMEGA-3/DHA/EPA (VAYACOG ORAL), 1 po qd, Disp: , Rfl:  . solifenacin (VESICARE) 10 MG tablet, Take 5 mg by mouth daily., Disp: , Rfl:  . traZODone (DESYREL) 50 MG tablet, Take 50 mg by mouth nightly., Disp: , Rfl:  . verapamil (CALAN-SR) 240 MG SR tablet, Take 240 mg by   mouth nightly., Disp: , Rfl:   A complete ROS was performed with pertinent positives/negatives noted in the HPI. The remainder of the ROS are negative.   Physical Exam:   Ht 1.524 m (5')  Wt 93 kg (205 lb)  BMI 40.04 kg/m   General: Healthy and alert, in no distress, breathing easily. Normal affect. In a pleasant mood. Head: Normocephalic, atraumatic. No masses, or scars. Eyes: Pupils are equal, and reactive to light. Vision is grossly intact. No spontaneous or gaze nystagmus. Ears: Ear canals are clear. Left tympanic membrane intact with a clear middle ear. Right tympanic membrane with a large  anterior perforation with clean edges. Middle ear looks healthy and dry. Hearing: Tuning fork lateralizes to the right. Nose: Nasal cavities are clear with healthy mucosa, no polyps or exudate.Airways are patent. Face: No masses or scars, facial nerve function is symmetric. Oral Cavity: No mucosal abnormalities are noted. Tongue with normal mobility. Dentition appears healthy. Oropharynx: Tonsils are symmetric. There are no mucosal masses identified. Tongue base appears normal and healthy. Larynx/Hypopharynx: deferred Chest: Deferred Neck: No palpable masses, no cervical adenopathy, no thyroid nodules or enlargement. Neuro: Cranial nerves II-XII will normal function. Balance: Normal gate. Other findings: none.  Independent Review of Additional Tests or Records:  Tympanogram is normal on the left. 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 Options were discussed including do nothing, hearing aid, tympanoplasty surgery. She has elected to undergo reconstructive surgery. We discussed the nature of the surgery, general anesthetic, outpatient, 80% chance of success. All questions were answered.      

## 2016-10-24 ENCOUNTER — Encounter (HOSPITAL_BASED_OUTPATIENT_CLINIC_OR_DEPARTMENT_OTHER): Payer: Self-pay | Admitting: Otolaryngology

## 2016-11-07 ENCOUNTER — Ambulatory Visit: Payer: Medicare Other | Admitting: Rheumatology

## 2016-11-22 ENCOUNTER — Other Ambulatory Visit: Payer: Self-pay | Admitting: Internal Medicine

## 2016-11-22 ENCOUNTER — Encounter: Payer: Self-pay | Admitting: Rheumatology

## 2016-11-22 ENCOUNTER — Ambulatory Visit (INDEPENDENT_AMBULATORY_CARE_PROVIDER_SITE_OTHER): Payer: Medicare Other | Admitting: Rheumatology

## 2016-11-22 VITALS — BP 128/78 | HR 74 | Resp 14 | Ht 60.5 in | Wt 206.0 lb

## 2016-11-22 DIAGNOSIS — R5383 Other fatigue: Secondary | ICD-10-CM | POA: Diagnosis not present

## 2016-11-22 DIAGNOSIS — M797 Fibromyalgia: Secondary | ICD-10-CM

## 2016-11-22 DIAGNOSIS — M8580 Other specified disorders of bone density and structure, unspecified site: Secondary | ICD-10-CM

## 2016-11-22 DIAGNOSIS — G4700 Insomnia, unspecified: Secondary | ICD-10-CM

## 2016-11-22 MED ORDER — DICLOFENAC SODIUM 1 % TD GEL: TRANSDERMAL | 2 refills | Status: DC

## 2016-11-22 MED ORDER — TRAZODONE HCL 50 MG PO TABS: 50.0000 mg | ORAL_TABLET | Freq: Every day | ORAL | 1 refills | Status: DC

## 2016-11-22 NOTE — Progress Notes (Signed)
Office Visit Note  Patient: [name]             Date of Birth: [birth year]           MRN: [id number]             PCP: Cassandria Anger, MD Referring: Cassandria Anger, MD Visit Date: 11/22/2016 Occupation: @GUAROCC @    Subjective:  No chief complaint on file.   History of Present Illness: [name] is a 71 y.o. female   Last seen 05/16/2016. On that visit, she rated her fibromyalgia as a 5 on a scale of 0-10. She also had complained of not sleeping well and was using trazodone to help her sleep. She also had fair amount of fatigue.  On the last visit, we did not have any updated bone density for the patient. She had one from 02/02/2008. Please see last office note from November 2017 for full details. Since she did not have access to Dr. Josie Dixon office anymore for repeat bone density, we advised the patient that she will should get it done at [facility].  Today, patient is doing about the same with the fibromyalgia. She does use/rely on trazodone to help her sleep well.  Patient is DEXA was done 05/25/2016 and shows right femoral neck with a BMD of 0.675 (no comparison) and a T score of -1.6. This is consistent with osteopenia; she has been advised to take calcium, vitamin D, weightbearing exercise and we will reorder a repeat bone density some time in January 2020 unless she has a nontraumatic fracture that may indicate a repeat bone density sooner.  Activities of Daily Living:  Patient reports morning stiffness for 30 minutes.   Patient Denies nocturnal pain.  Difficulty dressing/grooming: Denies Difficulty climbing stairs: Denies Difficulty getting out of chair: Denies Difficulty using hands for taps, buttons, cutlery, and/or writing: Denies   Review of Systems  Constitutional: Positive for fatigue.  HENT: Negative for mouth sores and mouth dryness.   Eyes: Negative for dryness.  Respiratory: Negative for shortness of breath.   Gastrointestinal: Negative for  constipation and diarrhea.  Musculoskeletal: Positive for myalgias and myalgias.  Skin: Negative for sensitivity to sunlight.  Psychiatric/Behavioral: Positive for sleep disturbance. Negative for decreased concentration.    PMFS History:  Patient Active Problem List   Diagnosis Date Noted  . Hearing loss 07/13/2016  . Influenza 09/16/2015  . Headache 12/24/2014  . Neoplasm of uncertain behavior of skin 12/24/2014  . 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 Dysuria 04/07/2014  . Cough 08/25/2013  . Well adult exam 06/16/2013  . Sinusitis, chronic 06/09/2013  . URI, acute 06/09/2012  . Restless legs syndrome (RLS) 01/26/2011  . FINGER PAIN 07/28/2010  . PALPITATIONS 10/18/2008  . VITAMIN D DEFICIENCY 10/09/2008  . DYSLIPIDEMIA 10/09/2008  . Morbid obesity ([place]) 10/09/2008  . Essential hypertension 10/09/2008  . SYNCOPE 09/27/2008  . UNSPECIFIED EUSTACHIAN TUBE DISORDER 04/09/2008  . ANXIETY 10/07/2007  . INSOMNIA, PERSISTENT 07/08/2007  . Adjustment disorder with mixed anxiety and depressed mood 07/08/2007  . RHINITIS 07/08/2007  . B12 deficiency 04/07/2007  . ANEMIA, DUE TO BLOOD LOSS 04/07/2007  . OSTEOARTHRITIS 04/07/2007  . GERD 04/04/2007  . DIVERTICULOSIS, COLON 04/04/2007    Past Medical History:  Diagnosis Date  . Anxiety   . B12 DEFICIENCY   . Cholecystitis, unspecified   . DEPRESSION   . Diastolic dysfunction   . DIVERTICULOSIS, COLON   . DYSLIPIDEMIA   . Fibromyalgia   .  GERD   . GERD (gastroesophageal reflux disease)   . Hyperplastic colon polyp   . HYPERTENSION   . INSOMNIA, PERSISTENT   . Morbid obesity ([place])   . OSTEOARTHRITIS   . PONV (postoperative nausea and vomiting)   . Skin cancer 2018   upper lip   . SYNCOPE   . Ulcer of esophagus   . VITAMIN D DEFICIENCY     Family History  Problem Relation Age of Onset  . Emphysema Father   . Heart disease Father   . Arthritis Mother   . Hypertension Mother        pulmonary  . Heart disease  Mother   . Colon cancer Maternal Aunt   . Diabetes Sister   . Lung cancer Sister   . Colon cancer Maternal Uncle   . Colon cancer Other        nephew  . Diabetes Maternal Aunt        x 4  . Diabetes Maternal Uncle        x 3  . Diabetes Paternal Uncle         1  . Kidney disease Other        niece  . Esophageal cancer Neg Hx   . Liver disease Neg Hx   . Breast cancer Neg Hx    Past Surgical History:  Procedure Laterality Date  . ABDOMINAL HYSTERECTOMY    . APPENDECTOMY    . BREAST BIOPSY Left 2010   Stero biopsy  . BREAST EXCISIONAL BIOPSY Left 1985   Benign   . CESAREAN SECTION    . CHOLECYSTECTOMY    . GASTROPLASTY    . gatric stapling    . MIDDLE EAR SURGERY Right   . revearsal of gastric stapling    . TONSILLECTOMY    . TOTAL KNEE ARTHROPLASTY     rt.  . TUBAL LIGATION    . TYMPANOPLASTY Right 10/22/2016   Procedure: RIGHT TYMPANOPLASTY;  Surgeon: Izora Gala, MD;  Location: [place];  Service: ENT;  Laterality: Right;   Social History   Social History Narrative  . No narrative on file     Objective: Vital Signs: BP 128/78   Pulse 74   Resp 14   Ht 5' 0.5" (1.537 m)   Wt 206 lb (93.4 kg)   BMI 39.57 kg/m    Physical Exam  Constitutional: She is oriented to person, place, and time. She appears well-developed and well-nourished.  HENT:  Head: Normocephalic and atraumatic.  Eyes: EOM are normal. Pupils are equal, round, and reactive to light.  Cardiovascular: Normal rate, regular rhythm and normal heart sounds.  Exam reveals no gallop and no friction rub.   No murmur heard. Pulmonary/Chest: Effort normal and breath sounds normal. She has no wheezes. She has no rales.  Abdominal: Soft. Bowel sounds are normal. She exhibits no distension. There is no tenderness. There is no guarding. No hernia.  Musculoskeletal: Normal range of motion. She exhibits no edema, tenderness or deformity.  Lymphadenopathy:    She has no cervical adenopathy.    Neurological: She is alert and oriented to person, place, and time. Coordination normal.  Skin: Skin is warm and dry. Capillary refill takes less than 2 seconds. No rash noted.  Psychiatric: She has a normal mood and affect. Her behavior is normal.  Nursing note and vitals reviewed.    Musculoskeletal Exam:  Full range of motion of all joints Grip strength is equal and strong bilaterally  For myalgia tender points are 6 out of 18 positive  CDAI Exam: CDAI Homunculus Exam:   Joint Counts:  CDAI Tender Joint count: 0 CDAI Swollen Joint count: 0  No synovitis on exam   Investigation: No additional findings.  No visits with results within 6 Month(s) from this visit.  Latest known visit with results is:  Appointment on 12/28/2015  Component Date Value Ref Range Status  . WBC 12/28/2015 6.8  4.0 - 10.5 K/uL Final  . RBC 12/28/2015 4.38  3.87 - 5.11 Mil/uL Final  . Hemoglobin 12/28/2015 13.2  12.0 - 15.0 g/dL Final  . HCT 12/28/2015 39.3  36.0 - 46.0 % Final  . MCV 12/28/2015 89.7  78.0 - 100.0 fl Final  . MCHC 12/28/2015 33.6  30.0 - 36.0 g/dL Final  . RDW 12/28/2015 14.2  11.5 - 15.5 % Final  . Platelets 12/28/2015 258.0  150.0 - 400.0 K/uL Final  . Neutrophils Relative % 12/28/2015 58.2  43.0 - 77.0 % Final  . Lymphocytes Relative 12/28/2015 31.4  12.0 - 46.0 % Final  . Monocytes Relative 12/28/2015 6.7  3.0 - 12.0 % Final  . Eosinophils Relative 12/28/2015 2.7  0.0 - 5.0 % Final  . Basophils Relative 12/28/2015 1.0  0.0 - 3.0 % Final  . Neutro Abs 12/28/2015 3.9  1.4 - 7.7 K/uL Final  . Lymphs Abs 12/28/2015 2.1  0.7 - 4.0 K/uL Final  . Monocytes Absolute 12/28/2015 0.5  0.1 - 1.0 K/uL Final  . Eosinophils Absolute 12/28/2015 0.2  0.0 - 0.7 K/uL Final  . Basophils Absolute 12/28/2015 0.1  0.0 - 0.1 K/uL Final  . Sodium 12/28/2015 142  135 - 145 mEq/L Final  . Potassium 12/28/2015 4.5  3.5 - 5.1 mEq/L Final  . Chloride 12/28/2015 105  96 - 112 mEq/L Final  . CO2  12/28/2015 30  19 - 32 mEq/L Final  . Glucose, Bld 12/28/2015 94  70 - 99 mg/dL Final  . BUN 12/28/2015 14  6 - 23 mg/dL Final  . Creatinine, Ser 12/28/2015 0.81  0.40 - 1.20 mg/dL Final  . Calcium 12/28/2015 9.5  8.4 - 10.5 mg/dL Final  . GFR 12/28/2015 74.26  >60.00 mL/min Final  . HCV Ab 12/28/2015 NEGATIVE  NEGATIVE Final  . Total Bilirubin 12/28/2015 0.8  0.2 - 1.2 mg/dL Final  . Bilirubin, Direct 12/28/2015 0.1  0.0 - 0.3 mg/dL Final  . Alkaline Phosphatase 12/28/2015 79  39 - 117 U/L Final  . AST 12/28/2015 12  0 - 37 U/L Final  . ALT 12/28/2015 12  0 - 35 U/L Final  . Total Protein 12/28/2015 6.8  6.0 - 8.3 g/dL Final  . Albumin 12/28/2015 4.2  3.5 - 5.2 g/dL Final  . Cholesterol 12/28/2015 256* 0 - 200 mg/dL Final   ATP III Classification       Desirable:  < 200 mg/dL               Borderline High:  200 - 239 mg/dL          High:  > = 240 mg/dL  . Triglycerides 12/28/2015 200.0* 0.0 - 149.0 mg/dL Final   Normal:  <150 mg/dLBorderline High:  150 - 199 mg/dL  . HDL 12/28/2015 46.70  >39.00 mg/dL Final  . VLDL 12/28/2015 40.0  0.0 - 40.0 mg/dL Final  . LDL Cholesterol 12/28/2015 169* 0 - 99 mg/dL Final  . Total CHOL/HDL Ratio 12/28/2015 5   Final  Men          Women1/2 Average Risk     3.4          3.3Average Risk          5.0          4.42X Average Risk          9.6          7.13X Average Risk          15.0          11.0                      . NonHDL 12/28/2015 209.20   Final   NOTE:  Non-HDL goal should be 30 mg/dL higher than patient's LDL goal (i.e. LDL goal of < 70 mg/dL, would have non-HDL goal of < 100 mg/dL)  . TSH 12/28/2015 1.16  0.35 - 4.50 uIU/mL Final  . Color, Urine 12/28/2015 YELLOW  Yellow;Lt. Yellow Final  . APPearance 12/28/2015 CLEAR  Clear Final  . Specific Gravity, Urine 12/28/2015 <=1.005* 1.000 - 1.030 Final  . pH 12/28/2015 6.0  5.0 - 8.0 Final  . Total Protein, Urine 12/28/2015 NEGATIVE  Negative Final  . Urine Glucose 12/28/2015 NEGATIVE   Negative Final  . Ketones, ur 12/28/2015 NEGATIVE  Negative Final  . Bilirubin Urine 12/28/2015 NEGATIVE  Negative Final  . Hgb urine dipstick 12/28/2015 NEGATIVE  Negative Final  . Urobilinogen, UA 12/28/2015 0.2  0.0 - 1.0 Final  . Leukocytes, UA 12/28/2015 TRACE* Negative Final  . Nitrite 12/28/2015 NEGATIVE  Negative Final  . WBC, UA 12/28/2015 0-2/hpf  0-2/hpf Final  . RBC / HPF 12/28/2015 none seen  0-2/hpf Final  . Squamous Epithelial / LPF 12/28/2015 Rare(0-4/hpf)  Rare(0-4/hpf) Final     Imaging: No results found.  Speciality Comments: No specialty comments available.    Procedures:  No procedures performed Allergies: Levaquin [levofloxacin]   Assessment / Plan:     Visit Diagnoses: Fibromyalgia  Other fatigue - Plan: CBC with Differential/Platelet, COMPLETE METABOLIC PANEL WITH GFR  Insomnia, unspecified type  Osteopenia, unspecified location - 11/22/2016: DEXA ordered by Ivin Booty on November 2017; - Plan: CBC with Differential/Platelet, COMPLETE METABOLIC PANEL WITH GFR   Osteopenia. Recent bone density done December 2017 shows a T score of -1.6. Patient will do weightbearing exercise, make sure her calcium and vitamin D levels are adequate.  Return to clinic in 6 months  Patient need CBC with differential, CMP with GFR, vitamin D levels. She will ask her PCP for this at the annual physical in July 2018. I've asked her to do CBC with differential and CRP with GFR for office and have them send [place] a copy  Orders: Orders Placed This Encounter  Procedures  . CBC with Differential/Platelet  . COMPLETE METABOLIC PANEL WITH GFR   Meds ordered this encounter  Medications  . diclofenac sodium (VOLTAREN) 1 % GEL    Sig: Voltaren Gel 3 grams to 3 large joints upto TID ; 10 TUBES with 2 refills    Dispense:  10 Tube    Refill:  2    Order Specific Question:   Supervising Provider    Answer:   Bo Merino [[id number]]  . traZODone (DESYREL) 50 MG tablet    Sig:  Take 1 tablet (50 mg total) by mouth at bedtime.    Dispense:  90 tablet    Refill:  1    Order Specific Question:  Supervising Provider    Answer:   Bo Merino [phone]    Face-to-face time spent with patient was 30 minutes. 50% of time was spent in counseling and coordination of care.  Follow-Up Instructions: Return in about 6 months (around 05/24/2017) for [place], [place], [place],.   Eliezer Lofts, PA-C  I examined and evaluated the patient with Eliezer Lofts PA. The plan of care was discussed as noted above.  Bo Merino, MD Note - This record has been created using Editor, commissioning.  Chart creation errors have been sought, but may not always  have been located. Such creation errors do not reflect on  the standard of medical care.

## 2016-11-24 ENCOUNTER — Other Ambulatory Visit: Payer: Self-pay | Admitting: Internal Medicine

## 2016-11-26 ENCOUNTER — Telehealth: Payer: Self-pay

## 2016-11-26 NOTE — Telephone Encounter (Signed)
Submitted a prior authorization for Voltaren Gel through cover my meds. Will update once we receive a response.   [name], [place], CPhT 4:38 PM

## 2016-11-27 NOTE — Telephone Encounter (Signed)
Received a fax from [employer] regarding a prior authorization DENIAL for Diclofenac.   Reference number:[id number] Phone number:[phone]  Will send document to scan center.  Spoke with patient to update her. Told her about the goodrx coupon. Patient voices understanding and denied any questions.  Shanard Treto, [place], CPhT   8:05 AM

## 2016-12-14 ENCOUNTER — Other Ambulatory Visit: Payer: Self-pay | Admitting: Internal Medicine

## 2017-01-11 ENCOUNTER — Encounter: Payer: Medicare Other | Admitting: Internal Medicine

## 2017-01-16 ENCOUNTER — Ambulatory Visit (INDEPENDENT_AMBULATORY_CARE_PROVIDER_SITE_OTHER): Payer: Medicare Other | Admitting: Internal Medicine

## 2017-01-16 ENCOUNTER — Other Ambulatory Visit (INDEPENDENT_AMBULATORY_CARE_PROVIDER_SITE_OTHER): Payer: Medicare Other

## 2017-01-16 ENCOUNTER — Encounter: Payer: Self-pay | Admitting: Internal Medicine

## 2017-01-16 VITALS — BP 126/78 | HR 93 | Temp 99.0°F | Ht 60.5 in | Wt 207.0 lb

## 2017-01-16 DIAGNOSIS — E538 Deficiency of other specified B group vitamins: Secondary | ICD-10-CM | POA: Diagnosis not present

## 2017-01-16 DIAGNOSIS — F411 Generalized anxiety disorder: Secondary | ICD-10-CM | POA: Diagnosis not present

## 2017-01-16 DIAGNOSIS — F4323 Adjustment disorder with mixed anxiety and depressed mood: Secondary | ICD-10-CM

## 2017-01-16 DIAGNOSIS — Z0000 Encounter for general adult medical examination without abnormal findings: Secondary | ICD-10-CM | POA: Diagnosis not present

## 2017-01-16 DIAGNOSIS — Z23 Encounter for immunization: Secondary | ICD-10-CM | POA: Diagnosis not present

## 2017-01-16 LAB — HEPATIC FUNCTION PANEL
ALK PHOS: 85 U/L (ref 39–117)
ALT: 11 U/L (ref 0–35)
AST: 10 U/L (ref 0–37)
Albumin: 4 g/dL (ref 3.5–5.2)
BILIRUBIN DIRECT: 0.1 mg/dL (ref 0.0–0.3)
BILIRUBIN TOTAL: 0.6 mg/dL (ref 0.2–1.2)
Total Protein: 6.7 g/dL (ref 6.0–8.3)

## 2017-01-16 LAB — LIPID PANEL
CHOL/HDL RATIO: 4
Cholesterol: 179 mg/dL (ref 0–200)
HDL: 42.8 mg/dL (ref >39.00)
LDL Cholesterol: 102 mg/dL — ABNORMAL HIGH (ref 0–99)
NONHDL: 136.05
TRIGLYCERIDES: 170 mg/dL — AB (ref 0.0–149.0)
VLDL: 34 mg/dL (ref 0.0–40.0)

## 2017-01-16 LAB — CBC WITH DIFFERENTIAL/PLATELET
BASOS ABS: 0 10*3/uL (ref 0.0–0.1)
BASOS PCT: 0.4 % (ref 0.0–3.0)
EOS ABS: 0.2 10*3/uL (ref 0.0–0.7)
Eosinophils Relative: 2.2 % (ref 0.0–5.0)
HEMATOCRIT: 39.9 % (ref 36.0–46.0)
HEMOGLOBIN: 12.9 g/dL (ref 12.0–15.0)
LYMPHS PCT: 29.5 % (ref 12.0–46.0)
Lymphs Abs: 2.2 10*3/uL (ref 0.7–4.0)
MCHC: 32.3 g/dL (ref 30.0–36.0)
MCV: 92 fl (ref 78.0–100.0)
MONO ABS: 0.5 10*3/uL (ref 0.1–1.0)
Monocytes Relative: 7.1 % (ref 3.0–12.0)
Neutro Abs: 4.6 10*3/uL (ref 1.4–7.7)
Neutrophils Relative %: 60.8 % (ref 43.0–77.0)
Platelets: 232 10*3/uL (ref 150.0–400.0)
RBC: 4.33 Mil/uL (ref 3.87–5.11)
RDW: 13.9 % (ref 11.5–15.5)
WBC: 7.6 10*3/uL (ref 4.0–10.5)

## 2017-01-16 LAB — URINALYSIS, ROUTINE W REFLEX MICROSCOPIC
Bilirubin Urine: NEGATIVE
HGB URINE DIPSTICK: NEGATIVE
Ketones, ur: NEGATIVE
NITRITE: NEGATIVE
RBC / HPF: NONE SEEN (ref 0–?)
Total Protein, Urine: NEGATIVE
Urine Glucose: NEGATIVE
Urobilinogen, UA: 0.2 (ref 0.0–1.0)
pH: 6.5 (ref 5.0–8.0)

## 2017-01-16 LAB — BASIC METABOLIC PANEL
BUN: 9 mg/dL (ref 6–23)
CALCIUM: 9.1 mg/dL (ref 8.4–10.5)
CHLORIDE: 105 meq/L (ref 96–112)
CO2: 28 mEq/L (ref 19–32)
CREATININE: 0.8 mg/dL (ref 0.40–1.20)
GFR: 75.1 mL/min (ref >60.00)
Glucose, Bld: 94 mg/dL (ref 70–99)
Potassium: 4.3 mEq/L (ref 3.5–5.1)
Sodium: 142 mEq/L (ref 135–145)

## 2017-01-16 LAB — TSH: TSH: 1.12 u[IU]/mL (ref 0.35–4.50)

## 2017-01-16 LAB — VITAMIN B12: VITAMIN B 12: 761 pg/mL (ref 211–911)

## 2017-01-16 MED ORDER — DICLOFENAC SODIUM 1 % TD GEL: TRANSDERMAL | 5 refills | Status: DC

## 2017-01-16 MED ORDER — ALPRAZOLAM 0.5 MG PO TABS: 0.5000 mg | ORAL_TABLET | Freq: Two times a day (BID) | ORAL | 3 refills | Status: DC | PRN

## 2017-01-16 NOTE — Progress Notes (Signed)
Subjective:  Patient ID: [name], female    DOB: 01/25/1946  Age: 71 y.o. MRN: [id number]  CC: No chief complaint on file.   HPI [name] presents for a well exam Her husband is ill w/lung ca  C/o grief, depression  Outpatient Medications Prior to Visit  Medication Sig Dispense Refill  . aspirin 81 MG tablet Take 81 mg by mouth daily.      Marland Kitchen buPROPion (WELLBUTRIN SR) 150 MG 12 hr tablet Take 1 tablet by mouth  twice a day 180 tablet 3  . butalbital-acetaminophen-caffeine (FIORICET WITH CODEINE) 50-325-40-30 MG per capsule Take 1 capsule by mouth every 6 (six) hours as needed for headache. 60 capsule 0  . Cholecalciferol (VITAMIN D) 2000 UNITS CAPS Take 1 capsule by mouth daily.      . fish oil-omega-3 fatty acids 1000 MG capsule Take by mouth daily. Take  600 mg daily    . fluticasone (FLONASE) 50 MCG/ACT nasal spray Use 2 sprays in each  nostril daily 48 g 3  . gabapentin (NEURONTIN) 100 MG capsule Take 1 capsule (100 mg total) by mouth at bedtime. Annual appt due in July must see MD for refills 90 capsule 0  . lovastatin (MEVACOR) 40 MG tablet Take 1 tablet (40 mg total) by mouth at bedtime. Annual appt w/labs is due in July must see Md for refills 90 tablet 0  . lubiprostone (AMITIZA) 24 MCG capsule Take 1 capsule (24 mcg total) by mouth 2 (two) times daily with a meal. 180 capsule 3  . Multiple Vitamins-Minerals (OCUVITE PO) Take 2 tablets by mouth daily.    Marland Kitchen omeprazole (PRILOSEC) 40 MG capsule TAKE 1 CAPSULE BY MOUTH  TWICE DAILY BEFORE MEALS 180 capsule 2  . Probiotic Product (ALIGN) 4 MG CAPS Take 1 capsule (4 mg total) by mouth daily. 30 capsule 1  . traZODone (DESYREL) 50 MG tablet Take 1 tablet (50 mg total) by mouth at bedtime. 90 tablet 1  . verapamil (CALAN-SR) 240 MG CR tablet TAKE 1 TABLET BY MOUTH AT  BEDTIME. YEARLY PHYSICAL  W/LABS DUE IN JULY MUST SEE MD FOR REFILLS 90 tablet 0  . VESICARE 10 MG tablet     . vitamin B-12 (CYANOCOBALAMIN) 1000 MCG tablet  Take 500 mcg by mouth daily.     Marland Kitchen ALPRAZolam (XANAX) 0.5 MG tablet Take 1 tablet (0.5 mg total) by mouth 2 (two) times daily as needed. 60 tablet 3  . diclofenac sodium (VOLTAREN) 1 % GEL Voltaren Gel 3 grams to 3 large joints upto TID ; 10 TUBES with 2 refills 10 Tube 2  . HYDROcodone-acetaminophen (NORCO) 7.5-325 MG tablet Take 1 tablet by mouth every 6 (six) hours as needed for moderate pain. 20 tablet 0  . promethazine (PHENERGAN) 25 MG suppository Place 1 suppository (25 mg total) rectally every 6 (six) hours as needed for nausea or vomiting. 12 suppository 1   Facility-Administered Medications Prior to Visit  Medication Dose Route Frequency Provider Last Rate Last Dose  . 0.9 %  sodium chloride infusion  500 mL Intravenous Continuous Irene Shipper, MD        ROS Review of Systems  Constitutional: Positive for fatigue. Negative for activity change, appetite change, chills and unexpected weight change.  HENT: Negative for congestion, mouth sores and sinus pressure.   Eyes: Negative for visual disturbance.  Respiratory: Negative for cough and chest tightness.   Gastrointestinal: Negative for abdominal pain and nausea.  Genitourinary: Negative for  difficulty urinating, frequency and vaginal pain.  Musculoskeletal: Positive for arthralgias, gait problem and myalgias. Negative for back pain.  Skin: Negative for pallor and rash.  Neurological: Negative for dizziness, tremors, weakness, numbness and headaches.  Psychiatric/Behavioral: Positive for dysphoric mood. Negative for confusion, sleep disturbance and suicidal ideas. The patient is nervous/anxious.     Objective:  BP 126/78 (BP Location: Left Arm, Patient Position: Sitting, Cuff Size: Large)   Pulse 93   Temp 99 F (37.2 C) (Oral)   Ht 5' 0.5" (1.537 m)   Wt 207 lb (93.9 kg)   SpO2 99%   BMI 39.76 kg/m   BP Readings from Last 3 Encounters:  01/16/17 126/78  11/22/16 128/78  10/22/16 (!) 153/61    Wt Readings from Last  3 Encounters:  01/16/17 207 lb (93.9 kg)  11/22/16 206 lb (93.4 kg)  10/22/16 208 lb 12.8 oz (94.7 kg)    Physical Exam  Constitutional: She appears well-developed. No distress.  HENT:  Head: Normocephalic.  Right Ear: External ear normal.  Left Ear: External ear normal.  Nose: Nose normal.  Mouth/Throat: Oropharynx is clear and moist.  Eyes: Pupils are equal, round, and reactive to light. Conjunctivae are normal. Right eye exhibits no discharge. Left eye exhibits no discharge.  Neck: Normal range of motion. Neck supple. No JVD present. No tracheal deviation present. No thyromegaly present.  Cardiovascular: Normal rate, regular rhythm and normal heart sounds.   Pulmonary/Chest: No stridor. No respiratory distress. She has no wheezes.  Abdominal: Soft. Bowel sounds are normal. She exhibits no distension and no mass. There is no tenderness. There is no rebound and no guarding.  Musculoskeletal: She exhibits tenderness. She exhibits no edema.  Lymphadenopathy:    She has no cervical adenopathy.  Neurological: She displays normal reflexes. No cranial nerve deficit. She exhibits normal muscle tone. Coordination abnormal.  Skin: No rash noted. No erythema.  Psychiatric: She has a normal mood and affect. Her behavior is normal. Judgment and thought content normal.  obese  Lab Results  Component Value Date   WBC 6.8 12/28/2015   HGB 13.2 12/28/2015   HCT 39.3 12/28/2015   PLT 258.0 12/28/2015   GLUCOSE 94 12/28/2015   CHOL 256 (H) 12/28/2015   TRIG 200.0 (H) 12/28/2015   HDL 46.70 12/28/2015   LDLDIRECT 137.2 06/16/2013   LDLCALC 169 (H) 12/28/2015   ALT 12 12/28/2015   AST 12 12/28/2015   NA 142 12/28/2015   K 4.5 12/28/2015   CL 105 12/28/2015   CREATININE 0.81 12/28/2015   BUN 14 12/28/2015   CO2 30 12/28/2015   TSH 1.16 12/28/2015   INR 0.8 03/13/2007    No results found.  Assessment & Plan:   There are no diagnoses linked to this encounter. I have discontinued Ms.  Kuang's ALPRAZolam, HYDROcodone-acetaminophen, promethazine, and diclofenac sodium. I am also having her maintain her aspirin, vitamin B-12, Vitamin D, fish oil-omega-3 fatty acids, Multiple Vitamins-Minerals (OCUVITE PO), ALIGN, VESICARE, butalbital-acetaminophen-caffeine, lubiprostone, fluticasone, buPROPion, omeprazole, verapamil, traZODone, lovastatin, and gabapentin. We will continue to administer sodium chloride.  No orders of the defined types were placed in this encounter.    Follow-up: No Follow-up on file.  Walker Kehr, MD

## 2017-01-16 NOTE — Patient Instructions (Addendum)
Try Turmeric Dr Dennard Nip

## 2017-01-16 NOTE — Assessment & Plan Note (Addendum)
Here for medicare wellness/physical  Diet: heart healthy  Physical activity: not sedentary  Depression/mood screen: reactive depression Hearing: intact to whispered voice  Visual acuity: grossly normal, performs annual eye exam  ADLs: capable  Fall risk: low to moderate Home safety: good  Cognitive evaluation: intact to orientation, naming, recall and repetition  EOL planning: adv directives, full code/ I agree  I have personally reviewed and have noted  1. The patient's medical, surgical and social history  2. Their use of alcohol, tobacco or illicit drugs  3. Their current medications and supplements  4. The patient's functional ability including ADL's, fall risks, home safety risks and hearing or visual impairment.  5. Diet and physical activities  6. Evidence for depression or mood disorders 7. The roster of all physicians providing medical care to patient - is listed in the Snapshot section of the chart and reviewed today.    Today patient counseled on age appropriate routine health concerns for screening and prevention, each reviewed and up to date or declined. Immunizations reviewed and up to date or declined. Labs ordered and reviewed. Risk factors for depression reviewed and negative. Hearing function and visual acuity are intact. ADLs screened and addressed as needed. Functional ability and level of safety reviewed and appropriate. Education, counseling and referrals performed based on assessed risks today. Patient provided with a copy of personalized plan for preventive services.

## 2017-01-16 NOTE — Assessment & Plan Note (Signed)
On Vit B12 

## 2017-01-26 ENCOUNTER — Encounter (HOSPITAL_COMMUNITY): Payer: Self-pay | Admitting: *Deleted

## 2017-01-26 ENCOUNTER — Ambulatory Visit (INDEPENDENT_AMBULATORY_CARE_PROVIDER_SITE_OTHER): Payer: Medicare Other | Admitting: Family Medicine

## 2017-01-26 ENCOUNTER — Other Ambulatory Visit: Payer: Self-pay | Admitting: Internal Medicine

## 2017-01-26 ENCOUNTER — Inpatient Hospital Stay (HOSPITAL_COMMUNITY)
Admission: EM | Admit: 2017-01-26 | Discharge: 2017-02-03 | DRG: 337 | Disposition: A | Discharge status: Home / Self Care | Payer: Medicare Other | Attending: Internal Medicine | Admitting: Internal Medicine

## 2017-01-26 ENCOUNTER — Ambulatory Visit (HOSPITAL_COMMUNITY)
Admission: RE | Admit: 2017-01-26 | Discharge: 2017-01-26 | Disposition: A | Discharge status: Home / Self Care | Payer: Medicare Other | Source: Ambulatory Visit | Attending: Family Medicine | Admitting: Family Medicine

## 2017-01-26 ENCOUNTER — Encounter: Payer: Self-pay | Admitting: Family Medicine

## 2017-01-26 VITALS — BP 128/74 | HR 81 | Temp 97.9°F | Wt 206.5 lb

## 2017-01-26 DIAGNOSIS — R110 Nausea: Secondary | ICD-10-CM

## 2017-01-26 DIAGNOSIS — F419 Anxiety disorder, unspecified: Secondary | ICD-10-CM | POA: Diagnosis present

## 2017-01-26 DIAGNOSIS — K561 Intussusception: Principal | ICD-10-CM | POA: Diagnosis present

## 2017-01-26 DIAGNOSIS — X58XXXA Exposure to other specified factors, initial encounter: Secondary | ICD-10-CM | POA: Diagnosis present

## 2017-01-26 DIAGNOSIS — Z8261 Family history of arthritis: Secondary | ICD-10-CM

## 2017-01-26 DIAGNOSIS — F329 Major depressive disorder, single episode, unspecified: Secondary | ICD-10-CM | POA: Diagnosis present

## 2017-01-26 DIAGNOSIS — R1310 Dysphagia, unspecified: Secondary | ICD-10-CM

## 2017-01-26 DIAGNOSIS — Z96651 Presence of right artificial knee joint: Secondary | ICD-10-CM | POA: Diagnosis present

## 2017-01-26 DIAGNOSIS — K5730 Diverticulosis of large intestine without perforation or abscess without bleeding: Secondary | ICD-10-CM | POA: Diagnosis present

## 2017-01-26 DIAGNOSIS — Z791 Long term (current) use of non-steroidal anti-inflammatories (NSAID): Secondary | ICD-10-CM

## 2017-01-26 DIAGNOSIS — K660 Peritoneal adhesions (postprocedural) (postinfection): Secondary | ICD-10-CM | POA: Diagnosis present

## 2017-01-26 DIAGNOSIS — Z9049 Acquired absence of other specified parts of digestive tract: Secondary | ICD-10-CM

## 2017-01-26 DIAGNOSIS — R1032 Left lower quadrant pain: Secondary | ICD-10-CM | POA: Insufficient documentation

## 2017-01-26 DIAGNOSIS — M797 Fibromyalgia: Secondary | ICD-10-CM | POA: Diagnosis present

## 2017-01-26 DIAGNOSIS — Z79899 Other long term (current) drug therapy: Secondary | ICD-10-CM

## 2017-01-26 DIAGNOSIS — Z7982 Long term (current) use of aspirin: Secondary | ICD-10-CM

## 2017-01-26 DIAGNOSIS — E785 Hyperlipidemia, unspecified: Secondary | ICD-10-CM | POA: Diagnosis present

## 2017-01-26 DIAGNOSIS — Z6839 Body mass index (BMI) 39.0-39.9, adult: Secondary | ICD-10-CM

## 2017-01-26 DIAGNOSIS — M1990 Unspecified osteoarthritis, unspecified site: Secondary | ICD-10-CM | POA: Diagnosis present

## 2017-01-26 DIAGNOSIS — Z881 Allergy status to other antibiotic agents status: Secondary | ICD-10-CM

## 2017-01-26 DIAGNOSIS — Z825 Family history of asthma and other chronic lower respiratory diseases: Secondary | ICD-10-CM

## 2017-01-26 DIAGNOSIS — R109 Unspecified abdominal pain: Secondary | ICD-10-CM

## 2017-01-26 DIAGNOSIS — Z90710 Acquired absence of both cervix and uterus: Secondary | ICD-10-CM

## 2017-01-26 DIAGNOSIS — Z9884 Bariatric surgery status: Secondary | ICD-10-CM

## 2017-01-26 DIAGNOSIS — Z86010 Personal history of colonic polyps: Secondary | ICD-10-CM

## 2017-01-26 DIAGNOSIS — K219 Gastro-esophageal reflux disease without esophagitis: Secondary | ICD-10-CM | POA: Diagnosis present

## 2017-01-26 DIAGNOSIS — Z801 Family history of malignant neoplasm of trachea, bronchus and lung: Secondary | ICD-10-CM

## 2017-01-26 DIAGNOSIS — Z8719 Personal history of other diseases of the digestive system: Secondary | ICD-10-CM

## 2017-01-26 DIAGNOSIS — Z8249 Family history of ischemic heart disease and other diseases of the circulatory system: Secondary | ICD-10-CM

## 2017-01-26 DIAGNOSIS — Z800 Family history of malignant neoplasm of digestive organs: Secondary | ICD-10-CM

## 2017-01-26 DIAGNOSIS — H04123 Dry eye syndrome of bilateral lacrimal glands: Secondary | ICD-10-CM | POA: Diagnosis not present

## 2017-01-26 DIAGNOSIS — Y92239 Unspecified place in hospital as the place of occurrence of the external cause: Secondary | ICD-10-CM | POA: Diagnosis present

## 2017-01-26 DIAGNOSIS — Z8711 Personal history of peptic ulcer disease: Secondary | ICD-10-CM

## 2017-01-26 DIAGNOSIS — E538 Deficiency of other specified B group vitamins: Secondary | ICD-10-CM | POA: Diagnosis present

## 2017-01-26 DIAGNOSIS — S0500XA Injury of conjunctiva and corneal abrasion without foreign body, unspecified eye, initial encounter: Secondary | ICD-10-CM | POA: Diagnosis not present

## 2017-01-26 DIAGNOSIS — Z833 Family history of diabetes mellitus: Secondary | ICD-10-CM

## 2017-01-26 DIAGNOSIS — Z85828 Personal history of other malignant neoplasm of skin: Secondary | ICD-10-CM

## 2017-01-26 DIAGNOSIS — I10 Essential (primary) hypertension: Secondary | ICD-10-CM | POA: Diagnosis present

## 2017-01-26 LAB — CBC WITH DIFFERENTIAL/PLATELET
BASOS ABS: 0 10*3/uL (ref 0.0–0.1)
BASOS ABS: 0 10*3/uL (ref 0.0–0.1)
BASOS PCT: 1 %
BASOS PCT: 0 %
EOS PCT: 2 %
Eosinophils Absolute: 0.2 10*3/uL (ref 0.0–0.7)
Eosinophils Absolute: 0.2 10*3/uL (ref 0.0–0.7)
Eosinophils Relative: 3 %
HCT: 35.6 % — ABNORMAL LOW (ref 36.0–46.0)
HEMATOCRIT: 36.7 % (ref 36.0–46.0)
Hemoglobin: 12 g/dL (ref 12.0–15.0)
Hemoglobin: 11.6 g/dL — ABNORMAL LOW (ref 12.0–15.0)
Lymphocytes Relative: 34 %
Lymphocytes Relative: 26 %
Lymphs Abs: 2.4 10*3/uL (ref 0.7–4.0)
Lymphs Abs: 2.1 10*3/uL (ref 0.7–4.0)
MCH: 29.8 pg (ref 26.0–34.0)
MCH: 29.4 pg (ref 26.0–34.0)
MCHC: 32.7 g/dL (ref 30.0–36.0)
MCHC: 32.6 g/dL (ref 30.0–36.0)
MCV: 91.1 fL (ref 78.0–100.0)
MCV: 90.4 fL (ref 78.0–100.0)
MONO ABS: 0.8 10*3/uL (ref 0.1–1.0)
Monocytes Absolute: 0.5 10*3/uL (ref 0.1–1.0)
Monocytes Relative: 6 %
Monocytes Relative: 10 %
NEUTROS ABS: 4 10*3/uL (ref 1.7–7.7)
NEUTROS ABS: 4.9 10*3/uL (ref 1.7–7.7)
NEUTROS PCT: 56 %
Neutrophils Relative %: 62 %
PLATELETS: 211 10*3/uL (ref 150–400)
Platelets: 216 10*3/uL (ref 150–400)
RBC: 4.03 MIL/uL (ref 3.87–5.11)
RBC: 3.94 MIL/uL (ref 3.87–5.11)
RDW: 13.7 % (ref 11.5–15.5)
RDW: 13.7 % (ref 11.5–15.5)
WBC: 7 10*3/uL (ref 4.0–10.5)
WBC: 8 10*3/uL (ref 4.0–10.5)

## 2017-01-26 LAB — COMPREHENSIVE METABOLIC PANEL
ALBUMIN: 3.8 g/dL (ref 3.5–5.0)
ALBUMIN: 3.5 g/dL (ref 3.5–5.0)
ALK PHOS: 76 U/L (ref 38–126)
ALT: 11 U/L — AB (ref 14–54)
ALT: 11 U/L — ABNORMAL LOW (ref 14–54)
AST: 12 U/L — AB (ref 15–41)
AST: 12 U/L — ABNORMAL LOW (ref 15–41)
Alkaline Phosphatase: 83 U/L (ref 38–126)
Anion gap: 5 (ref 5–15)
Anion gap: 8 (ref 5–15)
BILIRUBIN TOTAL: 0.6 mg/dL (ref 0.3–1.2)
BILIRUBIN TOTAL: 0.7 mg/dL (ref 0.3–1.2)
BUN: 8 mg/dL (ref 6–20)
BUN: 7 mg/dL (ref 6–20)
CALCIUM: 8.6 mg/dL — AB (ref 8.9–10.3)
CHLORIDE: 108 mmol/L (ref 101–111)
CO2: 29 mmol/L (ref 22–32)
CO2: 26 mmol/L (ref 22–32)
Calcium: 8.5 mg/dL — ABNORMAL LOW (ref 8.9–10.3)
Chloride: 108 mmol/L (ref 101–111)
Creatinine, Ser: 0.77 mg/dL (ref 0.44–1.00)
Creatinine, Ser: 0.68 mg/dL (ref 0.44–1.00)
GFR calc Af Amer: >60 mL/min (ref >60)
GFR calc Af Amer: >60 mL/min (ref >60)
GFR calc non Af Amer: >60 mL/min (ref >60)
GLUCOSE: 90 mg/dL (ref 65–99)
GLUCOSE: 89 mg/dL (ref 65–99)
POTASSIUM: 3.7 mmol/L (ref 3.5–5.1)
Potassium: 3.7 mmol/L (ref 3.5–5.1)
Sodium: 142 mmol/L (ref 135–145)
Sodium: 142 mmol/L (ref 135–145)
TOTAL PROTEIN: 6.7 g/dL (ref 6.5–8.1)
TOTAL PROTEIN: 6.2 g/dL — AB (ref 6.5–8.1)

## 2017-01-26 LAB — POC URINALSYSI DIPSTICK (AUTOMATED)
Bilirubin, UA: NEGATIVE
Blood, UA: NEGATIVE
GLUCOSE UA: NEGATIVE
KETONES UA: NEGATIVE
LEUKOCYTES UA: NEGATIVE
Nitrite, UA: NEGATIVE
Protein, UA: NEGATIVE
SPEC GRAV UA: 1.025 (ref 1.010–1.025)
UROBILINOGEN UA: 0.2 U/dL
pH, UA: 6 (ref 5.0–8.0)

## 2017-01-26 LAB — APTT: aPTT: 31 seconds (ref 24–36)

## 2017-01-26 LAB — URINALYSIS, ROUTINE W REFLEX MICROSCOPIC
BACTERIA UA: NONE SEEN
Bilirubin Urine: NEGATIVE
GLUCOSE, UA: NEGATIVE mg/dL
Hgb urine dipstick: NEGATIVE
KETONES UR: NEGATIVE mg/dL
Nitrite: NEGATIVE
PROTEIN: NEGATIVE mg/dL
Specific Gravity, Urine: 1.038 — ABNORMAL HIGH (ref 1.005–1.030)
pH: 6 (ref 5.0–8.0)

## 2017-01-26 LAB — PROTIME-INR
INR: 0.99
PROTHROMBIN TIME: 13.1 s (ref 11.4–15.2)

## 2017-01-26 LAB — LACTIC ACID, PLASMA: LACTIC ACID, VENOUS: 0.8 mmol/L (ref 0.5–1.9)

## 2017-01-26 MED ORDER — LORAZEPAM 2 MG/ML IJ SOLN
0.5000 mg | Freq: Four times a day (QID) | INTRAMUSCULAR | Status: DC | PRN
  Administered 2017-01-26: 0.5 mg via INTRAVENOUS
  Filled 2017-01-26: qty 1

## 2017-01-26 MED ORDER — SODIUM CHLORIDE 0.9 % IV SOLN
500.0000 mL | INTRAVENOUS | Status: DC
  Administered 2017-01-29: 500 mL via INTRAVENOUS

## 2017-01-26 MED ORDER — VITAMIN D 1000 UNITS PO TABS
2000.0000 [IU] | ORAL_TABLET | Freq: Every day | ORAL | Status: DC
  Administered 2017-01-26: 2000 [IU] via ORAL
  Filled 2017-01-26: qty 2

## 2017-01-26 MED ORDER — ONDANSETRON HCL 4 MG PO TABS
4.0000 mg | ORAL_TABLET | Freq: Four times a day (QID) | ORAL | Status: DC | PRN
  Administered 2017-02-03: 4 mg via ORAL
  Filled 2017-01-26: qty 1

## 2017-01-26 MED ORDER — HYDRALAZINE HCL 20 MG/ML IJ SOLN
2.0000 mg | Freq: Three times a day (TID) | INTRAMUSCULAR | Status: DC | PRN
  Administered 2017-01-26: 2 mg via INTRAVENOUS
  Filled 2017-01-26: qty 1
  Filled 2017-01-26: qty 0.1

## 2017-01-26 MED ORDER — FLUTICASONE PROPIONATE 50 MCG/ACT NA SUSP
2.0000 | Freq: Every day | NASAL | Status: DC
  Administered 2017-01-28 – 2017-02-03 (×6): 2 via NASAL
  Filled 2017-01-26: qty 16

## 2017-01-26 MED ORDER — IOPAMIDOL (ISOVUE-300) INJECTION 61%
100.0000 mL | Freq: Once | INTRAVENOUS | Status: AC | PRN
  Administered 2017-01-26: 100 mL via INTRAVENOUS

## 2017-01-26 MED ORDER — SODIUM CHLORIDE 0.9 % IV SOLN
INTRAVENOUS | Status: DC
  Administered 2017-01-29: 1000 mL via INTRAVENOUS
  Administered 2017-01-30: 20:00:00 via INTRAVENOUS
  Administered 2017-01-31: 1000 mL via INTRAVENOUS

## 2017-01-26 MED ORDER — ONDANSETRON HCL 4 MG/2ML IJ SOLN
4.0000 mg | Freq: Four times a day (QID) | INTRAMUSCULAR | Status: DC | PRN
  Administered 2017-01-27 – 2017-01-30 (×4): 4 mg via INTRAVENOUS
  Filled 2017-01-26 (×2): qty 2

## 2017-01-26 MED ORDER — SODIUM CHLORIDE 0.9 % IV SOLN
INTRAVENOUS | Status: DC
  Administered 2017-01-26: 16:00:00 via INTRAVENOUS

## 2017-01-26 MED ORDER — MORPHINE SULFATE (PF) 2 MG/ML IV SOLN
1.0000 mg | INTRAVENOUS | Status: DC | PRN
  Administered 2017-01-26: 1 mg via INTRAVENOUS
  Filled 2017-01-26: qty 1

## 2017-01-26 MED ORDER — ALIGN 4 MG PO CAPS
1.0000 | ORAL_CAPSULE | Freq: Every day | ORAL | Status: DC
  Administered 2017-01-26: 4 mg via ORAL
  Filled 2017-01-26 (×2): qty 1

## 2017-01-26 MED ORDER — VITAMIN B-12 1000 MCG PO TABS
500.0000 ug | ORAL_TABLET | Freq: Every day | ORAL | Status: DC
  Administered 2017-01-26: 500 ug via ORAL
  Filled 2017-01-26: qty 1

## 2017-01-26 MED ORDER — ASPIRIN 81 MG PO CHEW
81.0000 mg | CHEWABLE_TABLET | Freq: Every day | ORAL | Status: DC
  Administered 2017-01-26: 81 mg via ORAL
  Filled 2017-01-26: qty 1

## 2017-01-26 MED ORDER — HYDRALAZINE HCL 20 MG/ML IJ SOLN
15.0000 mg | Freq: Three times a day (TID) | INTRAMUSCULAR | Status: DC | PRN
  Filled 2017-01-26 (×2): qty 0.75

## 2017-01-26 MED ORDER — MORPHINE SULFATE (PF) 2 MG/ML IV SOLN
1.0000 mg | INTRAVENOUS | Status: DC | PRN
  Administered 2017-01-26 – 2017-01-30 (×16): 2 mg via INTRAVENOUS
  Filled 2017-01-26 (×16): qty 1

## 2017-01-26 NOTE — ED Triage Notes (Signed)
Pt was here as and out patient to get a CT scan for abd pain. The CT scan report reads as follows:  "Segment of jejuno-jejunal intussusception in the LEFT pelvis without evidence of bowel obstruction ; no lead point is visualized."  Pt report left sided abd pain. Pt a/o x 4 and ambulatory.

## 2017-01-26 NOTE — Progress Notes (Signed)
Dr. Vista Lawman aware via phone pt's abd pain increasing yet not time for prn morphine. See new order received.

## 2017-01-26 NOTE — H&P (Signed)
History and Physical    [name] GEX:[id number] DOB: [birth year] DOA: 01/26/2017  Referring MD/NP/PA:  PCP: Cassandria Anger, MD ( Outpatient Specialists:  Patient coming from:home  Chief Complaint: abdominal pain  HPI: [name] is a 71 y.o. female with medical history significant for but not limited to history of multiple intra-abdominal operations (6), hypertension and diverticulosis with chronic constipation presenting with 1 week history of left lower quadrant abdominal pain progressively worsening over the last 1-2 days associated with mild nausea without vomiting, no fever or chills, no hematochezia or melena   ED Course: at the ED patient was hemodynamically stable without any fever or leukocytosis. Lactic acid level was normal.CT scan abdomen and pelvis noted Segment of jejuno-jejunal intussusception in the LEFT pelvis without evidence of bowel obstruction ; no lead point is visualized. hospitalist requested to admit and surgery consulted for evaluation  Review of Systems: As per HPI otherwise 10 point review of systems negative.    Past Medical History:  Diagnosis Date  . Anxiety   . B12 DEFICIENCY   . Cholecystitis, unspecified   . DEPRESSION   . Diastolic dysfunction   . DIVERTICULOSIS, COLON   . DYSLIPIDEMIA   . Fibromyalgia   . GERD   . GERD (gastroesophageal reflux disease)   . Hyperplastic colon polyp   . HYPERTENSION   . INSOMNIA, PERSISTENT   . Morbid obesity ([place])   . OSTEOARTHRITIS   . PONV (postoperative nausea and vomiting)   . Skin cancer 2018   upper lip   . SYNCOPE   . Ulcer of esophagus   . VITAMIN D DEFICIENCY     Past Surgical History:  Procedure Laterality Date  . ABDOMINAL HYSTERECTOMY    . APPENDECTOMY    . BREAST BIOPSY Left 2010   Stero biopsy  . BREAST EXCISIONAL BIOPSY Left 1985   Benign   . CESAREAN SECTION    . CHOLECYSTECTOMY    . GASTROPLASTY    . gatric stapling    . MIDDLE EAR SURGERY Right   . revearsal of  gastric stapling    . TONSILLECTOMY    . TOTAL KNEE ARTHROPLASTY     rt.  . TUBAL LIGATION    . TYMPANOPLASTY Right 10/22/2016   Procedure: RIGHT TYMPANOPLASTY;  Surgeon: Izora Gala, MD;  Location: [facility];  Service: ENT;  Laterality: Right;     reports that she has never smoked. She has never used smokeless tobacco. She reports that she does not drink alcohol or use drugs.  Allergies  Allergen Reactions  . Levaquin [Levofloxacin]     nausea    Family History  Problem Relation Age of Onset  . Emphysema Father   . Heart disease Father   . Arthritis Mother   . Hypertension Mother        pulmonary  . Heart disease Mother   . Colon cancer Maternal Aunt   . Diabetes Sister   . Lung cancer Sister   . Colon cancer Maternal Uncle   . Colon cancer Other        nephew  . Diabetes Maternal Aunt        x 4  . Diabetes Maternal Uncle        x 3  . Diabetes Paternal Uncle         1  . Kidney disease Other        niece  . Esophageal cancer Neg Hx   . Liver disease Neg  Hx   . Breast cancer Neg Hx      Prior to Admission medications   Medication Sig Start Date End Date Taking? Authorizing Provider  acetaminophen (TYLENOL) 500 MG tablet Take 1,000 mg by mouth every 6 (six) hours as needed.   Yes [provider]  ALPRAZolam (XANAX) 0.5 MG tablet Take 1 tablet (0.5 mg total) by mouth 2 (two) times daily as needed. 01/16/17  Yes Plotnikov, Evie Lacks, MD  aspirin 81 MG tablet Take 81 mg by mouth daily.     Yes [provider]  buPROPion (WELLBUTRIN SR) 150 MG 12 hr tablet Take 1 tablet by mouth  twice a day 01/02/16  Yes Plotnikov, Evie Lacks, MD  Cholecalciferol (VITAMIN D) 2000 UNITS CAPS Take 1 capsule by mouth daily.     Yes [provider]  diclofenac sodium (VOLTAREN) 1 % GEL Apply to large joints up to TID as directed 01/16/17  Yes Plotnikov, Evie Lacks, MD  fish oil-omega-3 fatty acids 1000 MG capsule Take by mouth daily. Take  600 mg daily    Yes [provider]  fluticasone (FLONASE) 50 MCG/ACT nasal spray Use 2 sprays in each  nostril daily 12/30/15  Yes Plotnikov, Evie Lacks, MD  gabapentin (NEURONTIN) 100 MG capsule Take 1 capsule (100 mg total) by mouth at bedtime. Annual appt due in July must see MD for refills 12/14/16  Yes Plotnikov, Evie Lacks, MD  HYDROcodone-acetaminophen (NORCO/VICODIN) 5-325 MG tablet Take 1 tablet by mouth every 6 (six) hours as needed for moderate pain.   Yes [provider]  ibuprofen (ADVIL,MOTRIN) 200 MG tablet Take 200 mg by mouth every 6 (six) hours as needed.   Yes [provider]  lovastatin (MEVACOR) 40 MG tablet Take 1 tablet (40 mg total) by mouth at bedtime. Annual appt w/labs is due in July must see Md for refills 11/26/16  Yes Plotnikov, Evie Lacks, MD  lubiprostone (AMITIZA) 24 MCG capsule Take 1 capsule (24 mcg total) by mouth 2 (two) times daily with a meal. 10/14/15  Yes Esterwood, Amy S, PA-C  Multiple Vitamins-Minerals (OCUVITE PO) Take 2 tablets by mouth daily.   Yes [provider]  omeprazole (PRILOSEC) 40 MG capsule TAKE 1 CAPSULE BY MOUTH  TWICE DAILY BEFORE MEALS 04/12/16  Yes Irene Shipper, MD  Probiotic Product (ALIGN) 4 MG CAPS Take 1 capsule (4 mg total) by mouth daily. 06/21/13  Yes Plotnikov, Evie Lacks, MD  traZODone (DESYREL) 50 MG tablet Take 1 tablet (50 mg total) by mouth at bedtime. 11/22/16 05/21/17 Yes Panwala, Naitik, PA-C  verapamil (CALAN-SR) 240 MG CR tablet TAKE 1 TABLET BY MOUTH AT  BEDTIME. YEARLY PHYSICAL  W/LABS DUE IN JULY MUST SEE MD FOR REFILLS 11/22/16  Yes Plotnikov, Evie Lacks, MD  VESICARE 10 MG tablet Take 10 mg by mouth daily.  12/07/14  Yes [provider]  vitamin B-12 (CYANOCOBALAMIN) 1000 MCG tablet Take 500 mcg by mouth daily.    Yes [provider]  butalbital-acetaminophen-caffeine (FIORICET WITH CODEINE) 50-325-40-30 MG per capsule Take 1 capsule by mouth every 6 (six) hours as needed for headache. Patient  not taking: Reported on 01/26/2017 12/24/14   Plotnikov, Evie Lacks, MD    Physical Exam: Vitals:   01/26/17 1330  BP: (!) 176/127  Pulse: 85  Resp: 16  Temp: 97.6 F (36.4 C)  TempSrc: Oral  SpO2: 94%      Constitutional: NAD, calm, comfortable Vitals:   01/26/17 1330  BP: (!) 176/127  Pulse: 85  Resp: 16  Temp: 97.6 F (36.4 C)  TempSrc: Oral  SpO2: 94%   Eyes: PERRL, lids and conjunctivae normal ENMT: Mucous membranes are moist. Posterior pharynx clear of any exudate or lesions.Normal dentition.  Neck: normal, supple, no masses, no thyromegaly Respiratory: clear to auscultation bilaterally, no wheezing, no crackles. Normal respiratory effort. No accessory muscle use.  Cardiovascular: Regular rate and rhythm, no murmurs / rubs / gallops. No extremity edema. 2+ pedal pulses. No carotid bruits.  Abdomen: mild left lower quadrant tenderness without rebound tenderness, no guarding, no masses palpated. No hepatosplenomegaly. Bowel sounds positive.  Musculoskeletal: no clubbing / cyanosis. No joint deformity upper and lower extremities. Good ROM, no contractures. Normal muscle tone.  Skin: no rashes, lesions, ulcers. No induration Neurologic: CN 2-12 grossly intact. Sensation intact, DTR normal. Strength 5/5 in all 4.  Psychiatric: Normal judgment and insight. Alert and oriented x 3. Normal mood.    Labs on Admission: I have personally reviewed following labs and imaging studies  CBC:  Recent Labs Lab 01/26/17 1422  WBC 7.0  NEUTROABS 4.0  HGB 12.0  HCT 36.7  MCV 91.1  PLT 595   Basic Metabolic Panel:  Recent Labs Lab 01/26/17 1422  NA 142  K 3.7  CL 108  CO2 29  GLUCOSE 90  BUN 8  CREATININE 0.77  CALCIUM 8.5*   GFR: Estimated Creatinine Clearance: 66.7 mL/min (by C-G formula based on SCr of 0.77 mg/dL). Liver Function Tests:  Recent Labs Lab 01/26/17 1422  AST 12*  ALT 11*  ALKPHOS 83  BILITOT 0.6  PROT 6.7  ALBUMIN 3.8   No results for  input(s): LIPASE, AMYLASE in the last 168 hours. No results for input(s): AMMONIA in the last 168 hours. Coagulation Profile: No results for input(s): INR, PROTIME in the last 168 hours. Cardiac Enzymes: No results for input(s): CKTOTAL, CKMB, CKMBINDEX, TROPONINI in the last 168 hours. BNP (last 3 results) No results for input(s): PROBNP in the last 8760 hours. HbA1C: No results for input(s): HGBA1C in the last 72 hours. CBG: No results for input(s): GLUCAP in the last 168 hours. Lipid Profile: No results for input(s): CHOL, HDL, LDLCALC, TRIG, CHOLHDL, LDLDIRECT in the last 72 hours. Thyroid Function Tests: No results for input(s): TSH, T4TOTAL, FREET4, T3FREE, THYROIDAB in the last 72 hours. Anemia Panel: No results for input(s): VITAMINB12, FOLATE, FERRITIN, TIBC, IRON, RETICCTPCT in the last 72 hours. Urine analysis:    Component Value Date/Time   COLORURINE STRAW (A) 01/26/2017 1351   APPEARANCEUR CLEAR 01/26/2017 1351   LABSPEC 1.038 (H) 01/26/2017 1351   PHURINE 6.0 01/26/2017 1351   GLUCOSEU NEGATIVE 01/26/2017 1351   GLUCOSEU NEGATIVE 01/16/2017 1011   HGBUR NEGATIVE 01/26/2017 1351   BILIRUBINUR NEGATIVE 01/26/2017 1351   BILIRUBINUR neg 01/26/2017 1031   KETONESUR NEGATIVE 01/26/2017 1351   PROTEINUR NEGATIVE 01/26/2017 1351   UROBILINOGEN 0.2 01/26/2017 1031   UROBILINOGEN 0.2 01/16/2017 1011   NITRITE NEGATIVE 01/26/2017 1351   LEUKOCYTESUR SMALL (A) 01/26/2017 1351   Sepsis Labs: @LABRCNTIP (procalcitonin:4,lacticidven:4) )No results found for this or any previous visit (from the past 240 hour(s)).   Radiological Exams on Admission: Ct Abdomen Pelvis W Contrast  Result Date: 01/26/2017 CLINICAL DATA:  LEFT lower quadrant abdominal pain for 1 week, suspected diverticulitis, history colonic diverticulosis, hypertension, GERD, fibromyalgia EXAM: CT ABDOMEN AND PELVIS WITH CONTRAST TECHNIQUE: Multidetector CT imaging of the abdomen and pelvis was performed using  the standard protocol following bolus administration of intravenous  contrast. Sagittal and coronal MPR images reconstructed from axial data set. CONTRAST:  117mL ISOVUE-300 IOPAMIDOL (ISOVUE-300) INJECTION 61% IV. Dilute oral contrast. COMPARISON:  None available; prior CT from 02/12/2000 predates PACs and is not available for comparison FINDINGS: Lower chest: Lung bases clear Hepatobiliary: Gallbladder surgically absent. Scattered hepatic cysts. No biliary dilatation. Pancreas: Normal appearance Spleen: Normal appearance Adrenals/Urinary Tract: Adrenal glands, kidneys, ureters, and bladder normal appearance Stomach/Bowel: Prior gastric stapling. Segment of jejuno-jejunal intussusception in the upper LEFT pelvis, approximate 3 cm length, without bowel obstruction. Long redundant sigmoid loop. Bowel loops otherwise unremarkable. No evidence of diverticulitis or definite visualization of colonic diverticula. Appendix surgically absent by history. Vascular/Lymphatic: Atherosclerotic calcifications aorta and iliac arteries without aneurysm. No adenopathy. Reproductive: Uterus surgically absent with nonvisualization of ovaries Other: No free air or free fluid. No inflammatory process or hernia. Musculoskeletal: Osseous demineralization.  No acute bony findings. IMPRESSION: Segment of jejuno-jejunal intussusception in the LEFT pelvis without evidence of bowel obstruction ; no lead point is visualized. Hepatic cysts. No other significant intra-abdominal or intrapelvic abnormalities. Aortic Atherosclerosis (ICD10-I70.0). Electronically Signed   By: Lavonia Dana [name]   On: 01/26/2017 12:46    EKG:   Assessment/Plan Principal Problem:   Intussusception [facility]) Active Problems:   Essential hypertension   Diverticulosis of colon   #1 intussusception: Patient remained hemodynamically stable and nontoxic. Bowel rest IVF  abdominal x-ray with CBC and lactic acid level in the morning Gen. Surgery consulting  #2  diverticulosis: No acute Evidence of diverticulitis at this time   #3 hypertension: Hold home anti-hypertensive medications iV hydralazine when necessary for blood pressure control for now    DVT prophylaxis: SCD's Code Status:  (Full) Family Communication: Disposition Plan: home Consults called: general surgery (Dr Zella Richer) Admission status: ( obs / Kristie Cowman MD [facility] Pager [phone]  If 7PM-7AM, please contact night-coverage [email or web address] Password [facility]  01/26/2017, 3:47 PM

## 2017-01-26 NOTE — ED Provider Notes (Signed)
[facility] DEPT Provider Note   CSN: [id number] Arrival date & time: 01/26/17  1319     History   Chief Complaint Chief Complaint  Patient presents with  . Abdominal Pain    HPI [name] is a 71 y.o. female.  HPI  Pt was seen at 1350.  Per pt, c/o gradual onset and persistence of constant LLQ abd "pain" for the past 1 week, worse since yesterday.  Has been associated with no other symptoms.  Describes the abd pain as "cramping," initially waxing and waning, but now constant.  Pt was evaluated by her PMD today, sent for outpatient CT scan, then sent to the ED for further evaluation. Denies N/V, no diarrhea, no fevers, no back pain, no rash, no CP/SOB, no black or blood in stools.      Past Medical History:  Diagnosis Date  . Anxiety   . B12 DEFICIENCY   . Cholecystitis, unspecified   . DEPRESSION   . Diastolic dysfunction   . DIVERTICULOSIS, COLON   . DYSLIPIDEMIA   . Fibromyalgia   . GERD   . GERD (gastroesophageal reflux disease)   . Hyperplastic colon polyp   . HYPERTENSION   . INSOMNIA, PERSISTENT   . Morbid obesity ([place])   . OSTEOARTHRITIS   . PONV (postoperative nausea and vomiting)   . Skin cancer 2018   upper lip   . SYNCOPE   . Ulcer of esophagus   . VITAMIN D DEFICIENCY     Patient Active Problem List   Diagnosis Date Noted  . Abdominal pain, LLQ 01/26/2017  . Hearing loss 07/13/2016  . Influenza 09/16/2015  . Headache 12/24/2014  . Neoplasm of uncertain behavior of skin 12/24/2014  . 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 Dysuria 04/07/2014  . Cough 08/25/2013  . Well adult exam 06/16/2013  . Sinusitis, chronic 06/09/2013  . URI, acute 06/09/2012  . Restless legs syndrome (RLS) 01/26/2011  . FINGER PAIN 07/28/2010  . PALPITATIONS 10/18/2008  . VITAMIN D DEFICIENCY 10/09/2008  . DYSLIPIDEMIA 10/09/2008  . Morbid obesity ([place]) 10/09/2008  . Essential hypertension 10/09/2008  . SYNCOPE 09/27/2008  . UNSPECIFIED EUSTACHIAN TUBE  DISORDER 04/09/2008  . Anxiety state 10/07/2007  . INSOMNIA, PERSISTENT 07/08/2007  . Adjustment disorder with mixed anxiety and depressed mood 07/08/2007  . RHINITIS 07/08/2007  . B12 deficiency 04/07/2007  . ANEMIA, DUE TO BLOOD LOSS 04/07/2007  . OSTEOARTHRITIS 04/07/2007  . GERD 04/04/2007  . Diverticulosis of colon 04/04/2007    Past Surgical History:  Procedure Laterality Date  . ABDOMINAL HYSTERECTOMY    . APPENDECTOMY    . BREAST BIOPSY Left 2010   Stero biopsy  . BREAST EXCISIONAL BIOPSY Left 1985   Benign   . CESAREAN SECTION    . CHOLECYSTECTOMY    . GASTROPLASTY    . gatric stapling    . MIDDLE EAR SURGERY Right   . revearsal of gastric stapling    . TONSILLECTOMY    . TOTAL KNEE ARTHROPLASTY     rt.  . TUBAL LIGATION    . TYMPANOPLASTY Right 10/22/2016   Procedure: RIGHT TYMPANOPLASTY;  Surgeon: Izora Gala, MD;  Location: [facility];  Service: ENT;  Laterality: Right;    OB History    No data available       Home Medications    Prior to Admission medications   Medication Sig Start Date End Date Taking? Authorizing Provider  acetaminophen (TYLENOL) 500 MG tablet Take 1,000 mg by mouth  every 6 (six) hours as needed.   Yes [provider]  ALPRAZolam (XANAX) 0.5 MG tablet Take 1 tablet (0.5 mg total) by mouth 2 (two) times daily as needed. 01/16/17  Yes Plotnikov, Evie Lacks, MD  aspirin 81 MG tablet Take 81 mg by mouth daily.     Yes [provider]  buPROPion (WELLBUTRIN SR) 150 MG 12 hr tablet Take 1 tablet by mouth  twice a day 01/02/16  Yes Plotnikov, Evie Lacks, MD  Cholecalciferol (VITAMIN D) 2000 UNITS CAPS Take 1 capsule by mouth daily.     Yes [provider]  diclofenac sodium (VOLTAREN) 1 % GEL Apply to large joints up to TID as directed 01/16/17  Yes Plotnikov, Evie Lacks, MD  fish oil-omega-3 fatty acids 1000 MG capsule Take by mouth daily. Take  600 mg daily   Yes [provider]  fluticasone  (FLONASE) 50 MCG/ACT nasal spray Use 2 sprays in each  nostril daily 12/30/15  Yes Plotnikov, Evie Lacks, MD  gabapentin (NEURONTIN) 100 MG capsule Take 1 capsule (100 mg total) by mouth at bedtime. Annual appt due in July must see MD for refills 12/14/16  Yes Plotnikov, Evie Lacks, MD  HYDROcodone-acetaminophen (NORCO/VICODIN) 5-325 MG tablet Take 1 tablet by mouth every 6 (six) hours as needed for moderate pain.   Yes [provider]  ibuprofen (ADVIL,MOTRIN) 200 MG tablet Take 200 mg by mouth every 6 (six) hours as needed.   Yes [provider]  lovastatin (MEVACOR) 40 MG tablet Take 1 tablet (40 mg total) by mouth at bedtime. Annual appt w/labs is due in July must see Md for refills 11/26/16  Yes Plotnikov, Evie Lacks, MD  lubiprostone (AMITIZA) 24 MCG capsule Take 1 capsule (24 mcg total) by mouth 2 (two) times daily with a meal. 10/14/15  Yes Esterwood, Amy S, PA-C  Multiple Vitamins-Minerals (OCUVITE PO) Take 2 tablets by mouth daily.   Yes [provider]  omeprazole (PRILOSEC) 40 MG capsule TAKE 1 CAPSULE BY MOUTH  TWICE DAILY BEFORE MEALS 04/12/16  Yes Irene Shipper, MD  Probiotic Product (ALIGN) 4 MG CAPS Take 1 capsule (4 mg total) by mouth daily. 06/21/13  Yes Plotnikov, Evie Lacks, MD  traZODone (DESYREL) 50 MG tablet Take 1 tablet (50 mg total) by mouth at bedtime. 11/22/16 05/21/17 Yes Panwala, Naitik, PA-C  verapamil (CALAN-SR) 240 MG CR tablet TAKE 1 TABLET BY MOUTH AT  BEDTIME. YEARLY PHYSICAL  W/LABS DUE IN JULY MUST SEE MD FOR REFILLS 11/22/16  Yes Plotnikov, Evie Lacks, MD  VESICARE 10 MG tablet Take 10 mg by mouth daily.  12/07/14  Yes [provider]  vitamin B-12 (CYANOCOBALAMIN) 1000 MCG tablet Take 500 mcg by mouth daily.    Yes [provider]  butalbital-acetaminophen-caffeine (FIORICET WITH CODEINE) 50-325-40-30 MG per capsule Take 1 capsule by mouth every 6 (six) hours as needed for headache. Patient not taking: Reported on 01/26/2017 12/24/14    Plotnikov, Evie Lacks, MD    Family History Family History  Problem Relation Age of Onset  . Emphysema Father   . Heart disease Father   . Arthritis Mother   . Hypertension Mother        pulmonary  . Heart disease Mother   . Colon cancer Maternal Aunt   . Diabetes Sister   . Lung cancer Sister   . Colon cancer Maternal Uncle   . Colon cancer Other        nephew  . Diabetes Maternal  Aunt        x 4  . Diabetes Maternal Uncle        x 3  . Diabetes Paternal Uncle         1  . Kidney disease Other        niece  . Esophageal cancer Neg Hx   . Liver disease Neg Hx   . Breast cancer Neg Hx     Social History Social History  Substance Use Topics  . Smoking status: Never Smoker  . Smokeless tobacco: Never Used  . Alcohol use No     Allergies   Levaquin [levofloxacin]   Review of Systems Review of Systems ROS: Statement: All systems negative except as marked or noted in the HPI; Constitutional: Negative for fever and chills. ; ; Eyes: Negative for eye pain, redness and discharge. ; ; ENMT: Negative for ear pain, hoarseness, nasal congestion, sinus pressure and sore throat. ; ; Cardiovascular: Negative for chest pain, palpitations, diaphoresis, dyspnea and peripheral edema. ; ; Respiratory: Negative for cough, wheezing and stridor. ; ; Gastrointestinal: +abd pain. Negative for nausea, vomiting, diarrhea, blood in stool, hematemesis, jaundice and rectal bleeding. . ; ; Genitourinary: Negative for dysuria, flank pain and hematuria. ; ; Musculoskeletal: Negative for back pain and neck pain. Negative for swelling and trauma.; ; Skin: Negative for pruritus, rash, abrasions, blisters, bruising and skin lesion.; ; Neuro: Negative for headache, lightheadedness and neck stiffness. Negative for weakness, altered level of consciousness, altered mental status, extremity weakness, paresthesias, involuntary movement, seizure and syncope.       Physical Exam Updated Vital Signs BP (!)  176/127 (BP Location: Left Arm)   Pulse 85   Temp 97.6 F (36.4 C) (Oral)   Resp 16   SpO2 94%   Physical Exam 1355: Physical examination:  Nursing notes reviewed; Vital signs and O2 SAT reviewed;  Constitutional: Well developed, Well nourished, Well hydrated, In no acute distress; Head:  Normocephalic, atraumatic; Eyes: EOMI, PERRL, No scleral icterus; ENMT: Mouth and pharynx normal, Mucous membranes moist; Neck: Supple, Full range of motion, No lymphadenopathy; Cardiovascular: Regular rate and rhythm, No gallop; Respiratory: Breath sounds clear & equal bilaterally, No wheezes.  Speaking full sentences with ease, Normal respiratory effort/excursion; Chest: Nontender, Movement normal; Abdomen: Soft, +LLQ tenderness to palp. No rebound or guarding. Nondistended, Normal bowel sounds; Genitourinary: No CVA tenderness; Extremities: Pulses normal, No tenderness, No edema, No calf edema or asymmetry.; Neuro: AA&Ox3, Major CN grossly intact.  Speech clear. No gross focal motor or sensory deficits in extremities. Climbs on and off stretcher easily by herself. Gait steady.; Skin: Color normal, Warm, Dry.   ED Treatments / Results  Labs (all labs ordered are listed, but only abnormal results are displayed)   EKG  EKG Interpretation None       Radiology   Procedures Procedures (including critical care time)  Medications Ordered in ED Medications  0.9 %  sodium chloride infusion (not administered)     Initial Impression / Assessment and Plan / ED Course  I have reviewed the triage vital signs and the nursing notes.  Pertinent labs & imaging results that were available during my care of the patient were reviewed by me and considered in my medical decision making (see chart for details).  MDM Reviewed: previous chart, nursing note and vitals Reviewed previous: labs Interpretation: labs and CT scan   Results for orders placed or performed during the hospital encounter of 01/26/17    Lactic acid, plasma  Result Value Ref Range   Lactic Acid, Venous 0.8 0.5 - 1.9 mmol/L  Urinalysis, Routine w reflex microscopic  Result Value Ref Range   Color, Urine STRAW (A) YELLOW   APPearance CLEAR CLEAR   Specific Gravity, Urine 1.038 (H) 1.005 - 1.030   pH 6.0 5.0 - 8.0   Glucose, UA NEGATIVE NEGATIVE mg/dL   Hgb urine dipstick NEGATIVE NEGATIVE   Bilirubin Urine NEGATIVE NEGATIVE   Ketones, ur NEGATIVE NEGATIVE mg/dL   Protein, ur NEGATIVE NEGATIVE mg/dL   Nitrite NEGATIVE NEGATIVE   Leukocytes, UA SMALL (A) NEGATIVE   RBC / HPF 0-5 0 - 5 RBC/hpf   WBC, UA 0-5 0 - 5 WBC/hpf   Bacteria, UA NONE SEEN NONE SEEN   Squamous Epithelial / LPF 0-5 (A) NONE SEEN  CBC with Differential/Platelet  Result Value Ref Range   WBC 7.0 4.0 - 10.5 K/uL   RBC 4.03 3.87 - 5.11 MIL/uL   Hemoglobin 12.0 12.0 - 15.0 g/dL   HCT 36.7 36.0 - 46.0 %   MCV 91.1 78.0 - 100.0 fL   MCH 29.8 26.0 - 34.0 pg   MCHC 32.7 30.0 - 36.0 g/dL   RDW 13.7 11.5 - 15.5 %   Platelets 216 150 - 400 K/uL   Neutrophils Relative % 56 %   Neutro Abs 4.0 1.7 - 7.7 K/uL   Lymphocytes Relative 34 %   Lymphs Abs 2.4 0.7 - 4.0 K/uL   Monocytes Relative 6 %   Monocytes Absolute 0.5 0.1 - 1.0 K/uL   Eosinophils Relative 3 %   Eosinophils Absolute 0.2 0.0 - 0.7 K/uL   Basophils Relative 1 %   Basophils Absolute 0.0 0.0 - 0.1 K/uL  Comprehensive metabolic panel  Result Value Ref Range   Sodium 142 135 - 145 mmol/L   Potassium 3.7 3.5 - 5.1 mmol/L   Chloride 108 101 - 111 mmol/L   CO2 29 22 - 32 mmol/L   Glucose, Bld 90 65 - 99 mg/dL   BUN 8 6 - 20 mg/dL   Creatinine, Ser 0.77 0.44 - 1.00 mg/dL   Calcium 8.5 (L) 8.9 - 10.3 mg/dL   Total Protein 6.7 6.5 - 8.1 g/dL   Albumin 3.8 3.5 - 5.0 g/dL   AST 12 (L) 15 - 41 U/L   ALT 11 (L) 14 - 54 U/L   Alkaline Phosphatase 83 38 - 126 U/L   Total Bilirubin 0.6 0.3 - 1.2 mg/dL   GFR calc non Af Amer >60 >60 mL/min   GFR calc Af Amer >60 >60 mL/min   Anion gap 5 5 - 15    Ct Abdomen Pelvis W Contrast Result Date: 01/26/2017 CLINICAL DATA:  LEFT lower quadrant abdominal pain for 1 week, suspected diverticulitis, history colonic diverticulosis, hypertension, GERD, fibromyalgia EXAM: CT ABDOMEN AND PELVIS WITH CONTRAST TECHNIQUE: Multidetector CT imaging of the abdomen and pelvis was performed using the standard protocol following bolus administration of intravenous contrast. Sagittal and coronal MPR images reconstructed from axial data set. CONTRAST:  114mL ISOVUE-300 IOPAMIDOL (ISOVUE-300) INJECTION 61% IV. Dilute oral contrast. COMPARISON:  None available; prior CT from 02/12/2000 predates PACs and is not available for comparison FINDINGS: Lower chest: Lung bases clear Hepatobiliary: Gallbladder surgically absent. Scattered hepatic cysts. No biliary dilatation. Pancreas: Normal appearance Spleen: Normal appearance Adrenals/Urinary Tract: Adrenal glands, kidneys, ureters, and bladder normal appearance Stomach/Bowel: Prior gastric stapling. Segment of jejuno-jejunal intussusception in the upper LEFT pelvis, approximate 3 cm length, without bowel obstruction. Long redundant  sigmoid loop. Bowel loops otherwise unremarkable. No evidence of diverticulitis or definite visualization of colonic diverticula. Appendix surgically absent by history. Vascular/Lymphatic: Atherosclerotic calcifications aorta and iliac arteries without aneurysm. No adenopathy. Reproductive: Uterus surgically absent with nonvisualization of ovaries Other: No free air or free fluid. No inflammatory process or hernia. Musculoskeletal: Osseous demineralization.  No acute bony findings. IMPRESSION: Segment of jejuno-jejunal intussusception in the LEFT pelvis without evidence of bowel obstruction ; no lead point is visualized. Hepatic cysts. No other significant intra-abdominal or intrapelvic abnormalities. Aortic Atherosclerosis (ICD10-I70.0). Electronically Signed   By: Lavonia Dana [name]   On: 01/26/2017 12:46     1410:  CT as above.  T/C to General Surgery Dr. Zella Richer, case discussed, including:  HPI, pertinent PM/SHx, VS/PE, dx testing, ED course and treatment:  Agreeable to consult, no acute surgery at this time, requests to admit to medicine service, bowel rest, repeat AXR in the morning. Dx and testing, as well as d/w Surgeon, d/w pt.  Questions answered.  Verb understanding, agreeable to admit.  1525: T/C to [facility]. Vista Lawman, case discussed, including:  HPI, pertinent PM/SHx, VS/PE, dx testing, ED course and treatment, including d/w General Surgeon:  Agreeable to admit.     Final Clinical Impressions(s) / ED Diagnoses   Final diagnoses:  Abdominal pain, unspecified abdominal location    New Prescriptions New Prescriptions   No medications on file     Francine Graven, DO 01/27/17 3532

## 2017-01-26 NOTE — Consult Note (Signed)
Reason for Consult:Jejunal intussception Referring Physician: Dr. Leta Baptist is an 71 y.o. female.  HPI: this is a 71 year old female with chronic constipation who was been having some intermittent left lower quadrant pain for over one week. With the past 24-48 hours she states the pain is becoming worse. She says since a constant pain with intermittent cramping pain. She been able to eat. Her bowels are moving. She has minimal nausea and no vomiting. She went to the primary care clinic today and they sent her for a CT scan. This demonstrated findings suspicious for a jejunal intussusception. She's had no fever or chills. No blood in her stool. We were asked to see her because of the CT scan findings. Of note is that her lactate and her white blood cell count including differential are within normal limits. Also, she has had multiple (6) intra-abdominal operations.  Past Medical History:  Diagnosis Date  . Anxiety   . B12 DEFICIENCY   . Cholecystitis, unspecified   . DEPRESSION   . Diastolic dysfunction   . DIVERTICULOSIS, COLON   . DYSLIPIDEMIA   . Fibromyalgia   . GERD   . GERD (gastroesophageal reflux disease)   . Hyperplastic colon polyp   . HYPERTENSION   . INSOMNIA, PERSISTENT   . Morbid obesity ([place])   . OSTEOARTHRITIS   . PONV (postoperative nausea and vomiting)   . Skin cancer 2018   upper lip   . SYNCOPE   . Ulcer of esophagus   . VITAMIN D DEFICIENCY     Past Surgical History:  Procedure Laterality Date  . ABDOMINAL HYSTERECTOMY    . APPENDECTOMY    . BREAST BIOPSY Left 2010   Stero biopsy  . BREAST EXCISIONAL BIOPSY Left 1985   Benign   . CESAREAN SECTION    . CHOLECYSTECTOMY    . GASTROPLASTY    . gatric stapling    . MIDDLE EAR SURGERY Right   . revearsal of gastric stapling    . TONSILLECTOMY    . TOTAL KNEE ARTHROPLASTY     rt.  . TUBAL LIGATION    . TYMPANOPLASTY Right 10/22/2016   Procedure: RIGHT TYMPANOPLASTY;  Surgeon: Izora Gala, MD;   Location: [place];  Service: ENT;  Laterality: Right;    Family History  Problem Relation Age of Onset  . Emphysema Father   . Heart disease Father   . Arthritis Mother   . Hypertension Mother        pulmonary  . Heart disease Mother   . Colon cancer Maternal Aunt   . Diabetes Sister   . Lung cancer Sister   . Colon cancer Maternal Uncle   . Colon cancer Other        nephew  . Diabetes Maternal Aunt        x 4  . Diabetes Maternal Uncle        x 3  . Diabetes Paternal Uncle         1  . Kidney disease Other        niece  . Esophageal cancer Neg Hx   . Liver disease Neg Hx   . Breast cancer Neg Hx     Social History:  reports that she has never smoked. She has never used smokeless tobacco. She reports that she does not drink alcohol or use drugs.  Allergies:  Allergies  Allergen Reactions  . Levaquin [Levofloxacin]     nausea  Prior to Admission medications   Medication Sig Start Date End Date Taking? Authorizing Provider  acetaminophen (TYLENOL) 500 MG tablet Take 1,000 mg by mouth every 6 (six) hours as needed.   Yes [provider]  ALPRAZolam (XANAX) 0.5 MG tablet Take 1 tablet (0.5 mg total) by mouth 2 (two) times daily as needed. 01/16/17  Yes Plotnikov, Evie Lacks, MD  aspirin 81 MG tablet Take 81 mg by mouth daily.     Yes [provider]  buPROPion (WELLBUTRIN SR) 150 MG 12 hr tablet Take 1 tablet by mouth  twice a day 01/02/16  Yes Plotnikov, Evie Lacks, MD  Cholecalciferol (VITAMIN D) 2000 UNITS CAPS Take 1 capsule by mouth daily.     Yes [provider]  diclofenac sodium (VOLTAREN) 1 % GEL Apply to large joints up to TID as directed 01/16/17  Yes Plotnikov, Evie Lacks, MD  fish oil-omega-3 fatty acids 1000 MG capsule Take by mouth daily. Take  600 mg daily   Yes [provider]  fluticasone (FLONASE) 50 MCG/ACT nasal spray Use 2 sprays in each  nostril daily 12/30/15  Yes Plotnikov, Evie Lacks, MD  gabapentin  (NEURONTIN) 100 MG capsule Take 1 capsule (100 mg total) by mouth at bedtime. Annual appt due in July must see MD for refills 12/14/16  Yes Plotnikov, Evie Lacks, MD  HYDROcodone-acetaminophen (NORCO/VICODIN) 5-325 MG tablet Take 1 tablet by mouth every 6 (six) hours as needed for moderate pain.   Yes [provider]  ibuprofen (ADVIL,MOTRIN) 200 MG tablet Take 200 mg by mouth every 6 (six) hours as needed.   Yes [provider]  lovastatin (MEVACOR) 40 MG tablet Take 1 tablet (40 mg total) by mouth at bedtime. Annual appt w/labs is due in July must see Md for refills 11/26/16  Yes Plotnikov, Evie Lacks, MD  lubiprostone (AMITIZA) 24 MCG capsule Take 1 capsule (24 mcg total) by mouth 2 (two) times daily with a meal. 10/14/15  Yes Esterwood, Amy S, PA-C  Multiple Vitamins-Minerals (OCUVITE PO) Take 2 tablets by mouth daily.   Yes [provider]  omeprazole (PRILOSEC) 40 MG capsule TAKE 1 CAPSULE BY MOUTH  TWICE DAILY BEFORE MEALS 04/12/16  Yes Irene Shipper, MD  Probiotic Product (ALIGN) 4 MG CAPS Take 1 capsule (4 mg total) by mouth daily. 06/21/13  Yes Plotnikov, Evie Lacks, MD  traZODone (DESYREL) 50 MG tablet Take 1 tablet (50 mg total) by mouth at bedtime. 11/22/16 05/21/17 Yes Panwala, Naitik, PA-C  verapamil (CALAN-SR) 240 MG CR tablet TAKE 1 TABLET BY MOUTH AT  BEDTIME. YEARLY PHYSICAL  W/LABS DUE IN JULY MUST SEE MD FOR REFILLS 11/22/16  Yes Plotnikov, Evie Lacks, MD  VESICARE 10 MG tablet Take 10 mg by mouth daily.  12/07/14  Yes [provider]  vitamin B-12 (CYANOCOBALAMIN) 1000 MCG tablet Take 500 mcg by mouth daily.    Yes [provider]  butalbital-acetaminophen-caffeine (FIORICET WITH CODEINE) 50-325-40-30 MG per capsule Take 1 capsule by mouth every 6 (six) hours as needed for headache. Patient not taking: Reported on 01/26/2017 12/24/14   Plotnikov, Evie Lacks, MD     Results for orders placed or performed during the hospital encounter of 01/26/17 (from  the past 48 hour(s))  Urinalysis, Routine w reflex microscopic     Status: Abnormal   Collection Time: 01/26/17  1:51 PM  Result Value Ref Range   Color, Urine STRAW (A) YELLOW   APPearance CLEAR CLEAR   Specific Gravity, Urine  1.038 (H) 1.005 - 1.030   pH 6.0 5.0 - 8.0   Glucose, UA NEGATIVE NEGATIVE mg/dL   Hgb urine dipstick NEGATIVE NEGATIVE   Bilirubin Urine NEGATIVE NEGATIVE   Ketones, ur NEGATIVE NEGATIVE mg/dL   Protein, ur NEGATIVE NEGATIVE mg/dL   Nitrite NEGATIVE NEGATIVE   Leukocytes, UA SMALL (A) NEGATIVE   RBC / HPF 0-5 0 - 5 RBC/hpf   WBC, UA 0-5 0 - 5 WBC/hpf   Bacteria, UA NONE SEEN NONE SEEN   Squamous Epithelial / LPF 0-5 (A) NONE SEEN  Lactic acid, plasma     Status: None   Collection Time: 01/26/17  2:22 PM  Result Value Ref Range   Lactic Acid, Venous 0.8 0.5 - 1.9 mmol/L  CBC with Differential/Platelet     Status: None   Collection Time: 01/26/17  2:22 PM  Result Value Ref Range   WBC 7.0 4.0 - 10.5 K/uL   RBC 4.03 3.87 - 5.11 MIL/uL   Hemoglobin 12.0 12.0 - 15.0 g/dL   HCT 36.7 36.0 - 46.0 %   MCV 91.1 78.0 - 100.0 fL   MCH 29.8 26.0 - 34.0 pg   MCHC 32.7 30.0 - 36.0 g/dL   RDW 13.7 11.5 - 15.5 %   Platelets 216 150 - 400 K/uL   Neutrophils Relative % 56 %   Neutro Abs 4.0 1.7 - 7.7 K/uL   Lymphocytes Relative 34 %   Lymphs Abs 2.4 0.7 - 4.0 K/uL   Monocytes Relative 6 %   Monocytes Absolute 0.5 0.1 - 1.0 K/uL   Eosinophils Relative 3 %   Eosinophils Absolute 0.2 0.0 - 0.7 K/uL   Basophils Relative 1 %   Basophils Absolute 0.0 0.0 - 0.1 K/uL  Comprehensive metabolic panel     Status: Abnormal   Collection Time: 01/26/17  2:22 PM  Result Value Ref Range   Sodium 142 135 - 145 mmol/L   Potassium 3.7 3.5 - 5.1 mmol/L   Chloride 108 101 - 111 mmol/L   CO2 29 22 - 32 mmol/L   Glucose, Bld 90 65 - 99 mg/dL   BUN 8 6 - 20 mg/dL   Creatinine, Ser 0.77 0.44 - 1.00 mg/dL   Calcium 8.5 (L) 8.9 - 10.3 mg/dL   Total Protein 6.7 6.5 - 8.1 g/dL    Albumin 3.8 3.5 - 5.0 g/dL   AST 12 (L) 15 - 41 U/L   ALT 11 (L) 14 - 54 U/L   Alkaline Phosphatase 83 38 - 126 U/L   Total Bilirubin 0.6 0.3 - 1.2 mg/dL   GFR calc non Af Amer >60 >60 mL/min   GFR calc Af Amer >60 >60 mL/min    Comment: (NOTE) The eGFR has been calculated using the CKD EPI equation. This calculation has not been validated in all clinical situations. eGFR's persistently <60 mL/min signify possible Chronic Kidney Disease.    Anion gap 5 5 - 15    Ct Abdomen Pelvis W Contrast  Result Date: 01/26/2017 CLINICAL DATA:  LEFT lower quadrant abdominal pain for 1 week, suspected diverticulitis, history colonic diverticulosis, hypertension, GERD, fibromyalgia EXAM: CT ABDOMEN AND PELVIS WITH CONTRAST TECHNIQUE: Multidetector CT imaging of the abdomen and pelvis was performed using the standard protocol following bolus administration of intravenous contrast. Sagittal and coronal MPR images reconstructed from axial data set. CONTRAST:  166m ISOVUE-300 IOPAMIDOL (ISOVUE-300) INJECTION 61% IV. Dilute oral contrast. COMPARISON:  None available; prior CT from 02/12/2000 predates PACs and is not available  for comparison FINDINGS: Lower chest: Lung bases clear Hepatobiliary: Gallbladder surgically absent. Scattered hepatic cysts. No biliary dilatation. Pancreas: Normal appearance Spleen: Normal appearance Adrenals/Urinary Tract: Adrenal glands, kidneys, ureters, and bladder normal appearance Stomach/Bowel: Prior gastric stapling. Segment of jejuno-jejunal intussusception in the upper LEFT pelvis, approximate 3 cm length, without bowel obstruction. Long redundant sigmoid loop. Bowel loops otherwise unremarkable. No evidence of diverticulitis or definite visualization of colonic diverticula. Appendix surgically absent by history. Vascular/Lymphatic: Atherosclerotic calcifications aorta and iliac arteries without aneurysm. No adenopathy. Reproductive: Uterus surgically absent with nonvisualization of  ovaries Other: No free air or free fluid. No inflammatory process or hernia. Musculoskeletal: Osseous demineralization.  No acute bony findings. IMPRESSION: Segment of jejuno-jejunal intussusception in the LEFT pelvis without evidence of bowel obstruction ; no lead point is visualized. Hepatic cysts. No other significant intra-abdominal or intrapelvic abnormalities. Aortic Atherosclerosis (ICD10-I70.0). Electronically Signed   By: Lavonia Dana [name]   On: 01/26/2017 12:46    Review of Systems  Constitutional: Negative for chills and fever.  HENT: Positive for sore throat.   Respiratory: Positive for shortness of breath. Negative for cough.   Cardiovascular: Negative for chest pain.  Gastrointestinal: Positive for nausea. Negative for abdominal pain, blood in stool, constipation, diarrhea, heartburn and vomiting.  Musculoskeletal: Positive for back pain.  Neurological: Negative for focal weakness and seizures.  Endo/Heme/Allergies:       No bleeding disorders or blood clots.   Blood pressure (!) 195/87, pulse 82, temperature 97.6 F (36.4 C), temperature source Oral, resp. rate 18, SpO2 98 %. Physical Exam GENERAL APPEARANCE:  Obese female in NAD.  Pleasant and cooperative.  EARS, NOSE, MOUTH THROAT:  [place]/AT external ears:  no lesions or deformities external nose:  no lesions or deformities hearing:  grossly normal lips:  moist, no deformities EYES external: conjunctiva, lids, sclerae normal pupils:  equal, round   NECK:  Supple, no obvious mass or thyroid mass/enlargement, no trachea deviation  CV ascultation:  RRR, no murmur extremity edema:  no extremity varicosities:  no   RESP auscultation:  breath sounds equal and clear respiratory effort:  normal   GASTROINTESTINAL abdomen:  Soft, mild left lower quadrant tenderness with no guarding or peritoneal sign, non-distended, no masses liver and spleen:  not enlarged. hernia:  none present scar:  Upper and lower midline  scars,  MUSCULOSKELETAL station and gait:  normal digits/nails: no clubbing or cyanosis deformities:  none  LYMPHATIC: No palpable cervical, supraclavicular adenopathy.  SKIN No jaundice  NEUROLOGIC speech:  normal  PSYCHIATRIC alertness and orientation:  normal mood/affect/behavior:  normal judgement and insight:  normal  Assessment/Plan: Left lower quadrant abdominal pain has been present for over 1 week. Little bit worse within the last 2440 hrs. She actually looks quite well at the bedside and is in no acute distress. She has no signs of small bowel obstruction, intestinal ischemia, or peritonitis. She does not have a classic target sign, to my view, and CT scan. Differential diagnosis could be a transient intussusception versus small bowel diverticulum. I do not think she needs emergency surgery.  Recommendation: Admit to the hospital, bowel rest, will need to have her usual medications switched to IV form. Repeat x-ray and CBC as well as lactate in the morning. If she is getting better, we'll try a clear liquid diet. If she is not getting better or if the clinical liquid diet exacerbates her pain, she may need a diagnostic laparoscopy.  Eliakim Tendler J 01/26/2017, 3:58 PM

## 2017-01-26 NOTE — Progress Notes (Signed)
   Subjective:    Patient ID: [name], female    DOB: 1945/12/14, 71 y.o.   MRN: [id number]  HPI   71 year old female pt of Dr. Alain Marion with history ol HTN  diverticulosis presents with new onset pain in LLQ abdomen, constant, dull ache.  Has been going on x 1 week, gradually worsening in last 24-48 hours.  Using tylenol and advil. Did have  Old vicodin she used whiched helped the pain some, helped her sleep last night. Otherwise pain waking her at night. When severe radiates to back and left groin. 10/10 on pain scale.  No fever, mild nausea.  No emesis. No diarrhea.  On amitiza for constipation... regular BMs.  No blood in stool.  No dysuria or change in urination.  Last colonoscopy: showed diverticulosis.   No recent antibiotics in last month.  S/P  total hysterectomy  Urine dipstick shows negative for all components.  Social History /Family History/Past Medical History reviewed in detail and updated in EMR if needed. Blood pressure 128/74, pulse 81, temperature 97.9 F (36.6 C), temperature source Oral, weight 206 lb 8 oz (93.7 kg), SpO2 97 %.   Review of Systems  Constitutional: Negative for fatigue and fever.  HENT: Negative for ear pain.   Eyes: Negative for pain.  Respiratory: Negative for chest tightness and shortness of breath.   Cardiovascular: Negative for chest pain, palpitations and leg swelling.  Gastrointestinal: Negative for abdominal pain.  Genitourinary: Negative for dysuria.       Objective:   Physical Exam  Constitutional: Vital signs are normal. She appears well-developed and well-nourished. She is cooperative.  Non-toxic appearance. She does not appear ill. No distress.  HENT:  Head: Normocephalic.  Right Ear: Hearing, tympanic membrane, external ear and ear canal normal. Tympanic membrane is not erythematous, not retracted and not bulging.  Left Ear: Hearing, tympanic membrane, external ear and ear canal normal. Tympanic membrane is not  erythematous, not retracted and not bulging.  Nose: No rhinorrhea. Right sinus exhibits no maxillary sinus tenderness and no frontal sinus tenderness. Left sinus exhibits no maxillary sinus tenderness and no frontal sinus tenderness.  Mouth/Throat: Uvula is midline, oropharynx is clear and moist and mucous membranes are normal.  Eyes: Pupils are equal, round, and reactive to light. Conjunctivae, EOM and lids are normal. Lids are everted and swept, no foreign bodies found.  Neck: Trachea normal and normal range of motion. Neck supple. Carotid bruit is not present. No thyroid mass and no thyromegaly present.  Cardiovascular: Normal rate, regular rhythm, S1 normal, S2 normal, normal heart sounds, intact distal pulses and normal pulses.  Exam reveals no gallop and no friction rub.   No murmur heard. Pulmonary/Chest: Effort normal and breath sounds normal. No tachypnea. No respiratory distress. She has no decreased breath sounds. She has no wheezes. She has no rhonchi. She has no rales.  Abdominal: Soft. Normal appearance and bowel sounds are normal. There is no hepatosplenomegaly. There is tenderness in the left lower quadrant. There is no rigidity, no rebound, no guarding and no CVA tenderness. No hernia.  Neurological: She is alert.  Skin: Skin is warm, dry and intact. No rash noted.  Psychiatric: Her speech is normal and behavior is normal. Judgment and thought content normal. Her mood appears not anxious. Cognition and memory are normal. She does not exhibit a depressed mood.          Assessment & Plan:

## 2017-01-26 NOTE — Assessment & Plan Note (Signed)
Concerning for acute diverticulitis. HAs hx of diverticulosis.Marland Kitchen No past diverticulitis.  Will need Ct abd/pelvis to verify diagnosis.  Would be candidate for outpatient treatment, no red flags.     UA neg in office.

## 2017-01-26 NOTE — Patient Instructions (Addendum)
Do not eat or drink.  Go across street  to [facility] for abdominal CT scan.

## 2017-01-27 ENCOUNTER — Observation Stay (HOSPITAL_COMMUNITY): Payer: Medicare Other

## 2017-01-27 DIAGNOSIS — Z801 Family history of malignant neoplasm of trachea, bronchus and lung: Secondary | ICD-10-CM | POA: Diagnosis not present

## 2017-01-27 DIAGNOSIS — F419 Anxiety disorder, unspecified: Secondary | ICD-10-CM | POA: Diagnosis present

## 2017-01-27 DIAGNOSIS — H04123 Dry eye syndrome of bilateral lacrimal glands: Secondary | ICD-10-CM | POA: Diagnosis not present

## 2017-01-27 DIAGNOSIS — S0500XA Injury of conjunctiva and corneal abrasion without foreign body, unspecified eye, initial encounter: Secondary | ICD-10-CM | POA: Diagnosis not present

## 2017-01-27 DIAGNOSIS — R1032 Left lower quadrant pain: Secondary | ICD-10-CM | POA: Diagnosis present

## 2017-01-27 DIAGNOSIS — K5730 Diverticulosis of large intestine without perforation or abscess without bleeding: Secondary | ICD-10-CM | POA: Diagnosis present

## 2017-01-27 DIAGNOSIS — Z8249 Family history of ischemic heart disease and other diseases of the circulatory system: Secondary | ICD-10-CM | POA: Diagnosis not present

## 2017-01-27 DIAGNOSIS — Y92239 Unspecified place in hospital as the place of occurrence of the external cause: Secondary | ICD-10-CM | POA: Diagnosis present

## 2017-01-27 DIAGNOSIS — Z8719 Personal history of other diseases of the digestive system: Secondary | ICD-10-CM | POA: Diagnosis not present

## 2017-01-27 DIAGNOSIS — R109 Unspecified abdominal pain: Secondary | ICD-10-CM

## 2017-01-27 DIAGNOSIS — Z825 Family history of asthma and other chronic lower respiratory diseases: Secondary | ICD-10-CM | POA: Diagnosis not present

## 2017-01-27 DIAGNOSIS — M797 Fibromyalgia: Secondary | ICD-10-CM | POA: Diagnosis present

## 2017-01-27 DIAGNOSIS — Z85828 Personal history of other malignant neoplasm of skin: Secondary | ICD-10-CM | POA: Diagnosis not present

## 2017-01-27 DIAGNOSIS — Z86010 Personal history of colonic polyps: Secondary | ICD-10-CM | POA: Diagnosis not present

## 2017-01-27 DIAGNOSIS — Z800 Family history of malignant neoplasm of digestive organs: Secondary | ICD-10-CM | POA: Diagnosis not present

## 2017-01-27 DIAGNOSIS — Z96651 Presence of right artificial knee joint: Secondary | ICD-10-CM | POA: Diagnosis present

## 2017-01-27 DIAGNOSIS — F329 Major depressive disorder, single episode, unspecified: Secondary | ICD-10-CM | POA: Diagnosis present

## 2017-01-27 DIAGNOSIS — Z881 Allergy status to other antibiotic agents status: Secondary | ICD-10-CM | POA: Diagnosis not present

## 2017-01-27 DIAGNOSIS — E785 Hyperlipidemia, unspecified: Secondary | ICD-10-CM | POA: Diagnosis present

## 2017-01-27 DIAGNOSIS — I10 Essential (primary) hypertension: Secondary | ICD-10-CM | POA: Diagnosis present

## 2017-01-27 DIAGNOSIS — X58XXXA Exposure to other specified factors, initial encounter: Secondary | ICD-10-CM | POA: Diagnosis present

## 2017-01-27 DIAGNOSIS — Z9884 Bariatric surgery status: Secondary | ICD-10-CM | POA: Diagnosis not present

## 2017-01-27 DIAGNOSIS — E538 Deficiency of other specified B group vitamins: Secondary | ICD-10-CM | POA: Diagnosis present

## 2017-01-27 DIAGNOSIS — Z9049 Acquired absence of other specified parts of digestive tract: Secondary | ICD-10-CM | POA: Diagnosis not present

## 2017-01-27 DIAGNOSIS — K219 Gastro-esophageal reflux disease without esophagitis: Secondary | ICD-10-CM | POA: Diagnosis present

## 2017-01-27 DIAGNOSIS — K561 Intussusception: Secondary | ICD-10-CM | POA: Diagnosis present

## 2017-01-27 DIAGNOSIS — Z6839 Body mass index (BMI) 39.0-39.9, adult: Secondary | ICD-10-CM | POA: Diagnosis not present

## 2017-01-27 DIAGNOSIS — Z90710 Acquired absence of both cervix and uterus: Secondary | ICD-10-CM | POA: Diagnosis not present

## 2017-01-27 LAB — CBC
HCT: 35 % — ABNORMAL LOW (ref 36.0–46.0)
Hemoglobin: 11.1 g/dL — ABNORMAL LOW (ref 12.0–15.0)
MCH: 29.1 pg (ref 26.0–34.0)
MCHC: 31.7 g/dL (ref 30.0–36.0)
MCV: 91.9 fL (ref 78.0–100.0)
Platelets: 198 10*3/uL (ref 150–400)
RBC: 3.81 MIL/uL — ABNORMAL LOW (ref 3.87–5.11)
RDW: 13.9 % (ref 11.5–15.5)
WBC: 5.8 10*3/uL (ref 4.0–10.5)

## 2017-01-27 LAB — LACTIC ACID, PLASMA: Lactic Acid, Venous: 0.6 mmol/L (ref 0.5–1.9)

## 2017-01-27 MED ORDER — PANTOPRAZOLE SODIUM 40 MG PO TBEC
40.0000 mg | DELAYED_RELEASE_TABLET | Freq: Every day | ORAL | Status: DC
  Administered 2017-01-28 – 2017-02-01 (×5): 40 mg via ORAL
  Filled 2017-01-27 (×5): qty 1

## 2017-01-27 MED ORDER — PROMETHAZINE HCL 25 MG/ML IJ SOLN: 6.2500 mg | Freq: Four times a day (QID) | INTRAMUSCULAR | Status: DC | PRN

## 2017-01-27 MED ORDER — ALIGN 4 MG PO CAPS
1.0000 | ORAL_CAPSULE | Freq: Every day | ORAL | Status: DC
Start: 2017-01-27 — End: 2017-02-03
  Administered 2017-01-29 – 2017-02-02 (×4): 4 mg via ORAL
  Filled 2017-01-27 (×8): qty 1

## 2017-01-27 MED ORDER — IOPAMIDOL (ISOVUE-300) INJECTION 61%
INTRAVENOUS | Status: AC
  Administered 2017-01-27: 100 mL via INTRAVENOUS
  Filled 2017-01-27: qty 150

## 2017-01-27 MED ORDER — VITAMIN D 1000 UNITS PO TABS
2000.0000 [IU] | ORAL_TABLET | Freq: Every day | ORAL | Status: DC
  Administered 2017-01-29 – 2017-02-02 (×4): 2000 [IU] via ORAL
  Filled 2017-01-27 (×5): qty 2

## 2017-01-27 MED ORDER — IOPAMIDOL (ISOVUE-300) INJECTION 61%
INTRAVENOUS | Status: AC
  Filled 2017-01-27: qty 100

## 2017-01-27 MED ORDER — VITAMIN B-12 1000 MCG PO TABS
500.0000 ug | ORAL_TABLET | Freq: Every day | ORAL | Status: DC
  Administered 2017-01-29 – 2017-02-02 (×4): 500 ug via ORAL
  Filled 2017-01-27 (×5): qty 1

## 2017-01-27 MED ORDER — BARIUM SULFATE 0.1 % PO SUSP
ORAL | Status: AC
  Filled 2017-01-27: qty 3

## 2017-01-27 MED ORDER — ASPIRIN 81 MG PO CHEW
81.0000 mg | CHEWABLE_TABLET | Freq: Every day | ORAL | Status: DC
  Administered 2017-01-29 – 2017-02-02 (×4): 81 mg via ORAL
  Filled 2017-01-27 (×5): qty 1

## 2017-01-27 NOTE — Progress Notes (Signed)
Assessment Principal Problem:   LLQ/Left groin pain with suspicion of jejenal intussusception (HCC)-still with the pain but WBC, lactate and VS remain normal Active Problems:   Essential hypertension   Diverticulosis of colon   Plan:  CT enterography.  If this verifies intussusception and lead point be a neoplasm, may need surgery.   LOS: 0 days        Chief Complaint/Subjective: She reports LLQ and groin pain radiating to left flank.  She was able to sleep some.    Objective: Vital signs in last 24 hours: Temp:  [97.6 F (36.4 C)-98.1 F (36.7 C)] 97.9 F (36.6 C) (08/12 0535) Pulse Rate:  [73-85] 73 (08/12 0535) Resp:  [16-18] 18 (08/12 0535) BP: (134-195)/(63-127) 134/67 (08/12 0535) SpO2:  [94 %-99 %] 95 % (08/12 0535) Weight:  [93.7 kg (206 lb 8 oz)] 93.7 kg (206 lb 8 oz) (08/11 1800) Last BM Date: 01/26/17  Intake/Output from previous day: 08/11 0701 - 08/12 0700 In: 240 [I.V.:240] Out: 275 [Urine:275] Intake/Output this shift: No intake/output data recorded.  PE: General- In NAD. She does not look ill.  Awake and alert. Abdomen-soft, minimal LLQ tenderness and left groin tenderness  Lab Results:   Recent Labs  01/26/17 1810 01/27/17 0548  WBC 8.0 5.8  HGB 11.6* 11.1*  HCT 35.6* 35.0*  PLT 211 198   BMET  Recent Labs  01/26/17 1422 01/26/17 1810  NA 142 142  K 3.7 3.7  CL 108 108  CO2 29 26  GLUCOSE 90 89  BUN 8 7  CREATININE 0.77 0.68  CALCIUM 8.5* 8.6*   PT/INR  Recent Labs  01/26/17 1810  LABPROT 13.1  INR 0.99   Comprehensive Metabolic Panel:    Component Value Date/Time   NA 142 01/26/2017 1810   NA 142 01/26/2017 1422   K 3.7 01/26/2017 1810   K 3.7 01/26/2017 1422   CL 108 01/26/2017 1810   CL 108 01/26/2017 1422   CO2 26 01/26/2017 1810   CO2 29 01/26/2017 1422   BUN 7 01/26/2017 1810   BUN 8 01/26/2017 1422   CREATININE 0.68 01/26/2017 1810   CREATININE 0.77 01/26/2017 1422   GLUCOSE 89 01/26/2017 1810   GLUCOSE  90 01/26/2017 1422   CALCIUM 8.6 (L) 01/26/2017 1810   CALCIUM 8.5 (L) 01/26/2017 1422   AST 12 (L) 01/26/2017 1810   AST 12 (L) 01/26/2017 1422   ALT 11 (L) 01/26/2017 1810   ALT 11 (L) 01/26/2017 1422   ALKPHOS 76 01/26/2017 1810   ALKPHOS 83 01/26/2017 1422   BILITOT 0.7 01/26/2017 1810   BILITOT 0.6 01/26/2017 1422   PROT 6.2 (L) 01/26/2017 1810   PROT 6.7 01/26/2017 1422   ALBUMIN 3.5 01/26/2017 1810   ALBUMIN 3.8 01/26/2017 1422     Studies/Results: Ct Abdomen Pelvis W Contrast  Result Date: 01/26/2017 CLINICAL DATA:  LEFT lower quadrant abdominal pain for 1 week, suspected diverticulitis, history colonic diverticulosis, hypertension, GERD, fibromyalgia EXAM: CT ABDOMEN AND PELVIS WITH CONTRAST TECHNIQUE: Multidetector CT imaging of the abdomen and pelvis was performed using the standard protocol following bolus administration of intravenous contrast. Sagittal and coronal MPR images reconstructed from axial data set. CONTRAST:  146mL ISOVUE-300 IOPAMIDOL (ISOVUE-300) INJECTION 61% IV. Dilute oral contrast. COMPARISON:  None available; prior CT from 02/12/2000 predates PACs and is not available for comparison FINDINGS: Lower chest: Lung bases clear Hepatobiliary: Gallbladder surgically absent. Scattered hepatic cysts. No biliary dilatation. Pancreas: Normal appearance Spleen: Normal appearance Adrenals/Urinary Tract: Adrenal  glands, kidneys, ureters, and bladder normal appearance Stomach/Bowel: Prior gastric stapling. Segment of jejuno-jejunal intussusception in the upper LEFT pelvis, approximate 3 cm length, without bowel obstruction. Long redundant sigmoid loop. Bowel loops otherwise unremarkable. No evidence of diverticulitis or definite visualization of colonic diverticula. Appendix surgically absent by history. Vascular/Lymphatic: Atherosclerotic calcifications aorta and iliac arteries without aneurysm. No adenopathy. Reproductive: Uterus surgically absent with nonvisualization of  ovaries Other: No free air or free fluid. No inflammatory process or hernia. Musculoskeletal: Osseous demineralization.  No acute bony findings. IMPRESSION: Segment of jejuno-jejunal intussusception in the LEFT pelvis without evidence of bowel obstruction ; no lead point is visualized. Hepatic cysts. No other significant intra-abdominal or intrapelvic abnormalities. Aortic Atherosclerosis (ICD10-I70.0). Electronically Signed   By: Lavonia Dana [name]   On: 01/26/2017 12:46   Dg Abd 2 Views  Result Date: 01/27/2017 CLINICAL DATA:  LEFT lower quadrant abdominal pain EXAM: ABDOMEN - 2 VIEW COMPARISON:  CT abdomen and pelvis 01/26/2017 FINDINGS: Retained contrast in colon. Prior gastric bypass procedure. Nonobstructive bowel gas pattern. No bowel dilatation, bowel wall thickening or evidence of obstruction. Osseous structures unremarkable. Few pelvic phleboliths. No urinary tract calcification. IMPRESSION: Nonobstructive bowel gas pattern. Electronically Signed   By: Lavonia Dana [name]   On: 01/27/2017 08:22    Anti-infectives: Anti-infectives    None       Jarrad Mclees J 01/27/2017

## 2017-01-27 NOTE — Progress Notes (Signed)
Dr. Harlow Asa aware via phone of CT results and pt anxious  for something to drink. See new orders received.

## 2017-01-27 NOTE — Progress Notes (Signed)
PROGRESS NOTE  [name] JHE:[id number] DOB: June 12, 1946 DOA: 01/26/2017 PCP: Cassandria Anger, MD   LOS: 0 days   Brief Narrative / Interim history: [name] is a 71 y.o. female with medical history significant for but not limited to history of multiple intra-abdominal operations (6), hypertension and diverticulosis with chronic constipation presenting with 1 week history of left lower quadrant abdominal pain progressively worsening over the last 1-2 days associated with mild nausea without vomiting, no fever or chills, no hematochezia or melena  Assessment & Plan: Principal Problem:   Intussusception ([place]) Active Problems:   Essential hypertension   Diverticulosis of colon   Jejuno - jejunal intussusception -confirmed on CT scan -general surgery consulted, recommends CT enterography, patient might need surgery  -continue NPO, IVF, symptomatic management -in case she needs surgery, her GUPTA periop risk is 0.25%  -Will request Surgeon to please Order DVT prophylaxis of his/her choice, along with activity, weight bearing precautions and diet if appropriate.   -routine EKG with sinus rhythm  Diverticulosis - without evidence of diverticulitis  Hypertension -Hold home anti-hypertensive medications -IV hydralazine when necessary for blood pressure control for now   DVT prophylaxis: SCD Code Status: Full code Family Communication: daughter at bedside Disposition Plan: home when ready  Consultants:   General surgery  Procedures:   None   Antimicrobials:  None    Subjective: -ongoing abdominal pain, no vomiting. No chest pain, shortness of breath   Objective: Vitals:   01/26/17 1855 01/26/17 2202 01/26/17 2207 01/27/17 0535  BP:  (!) 147/63  134/67  Pulse:  84  73  Resp:  18 18 18   Temp: 98.1 F (36.7 C) 97.9 F (36.6 C)  97.9 F (36.6 C)  TempSrc: Oral Oral Oral Oral  SpO2:  94%  95%  Weight:      Height:        Intake/Output Summary (Last 24  hours) at 01/27/17 1215 Last data filed at 01/27/17 1000  Gross per 24 hour  Intake              320 ml  Output              275 ml  Net               45 ml   Filed Weights   01/26/17 1800  Weight: 93.7 kg (206 lb 8 oz)    Examination:  Vitals:   01/26/17 1855 01/26/17 2202 01/26/17 2207 01/27/17 0535  BP:  (!) 147/63  134/67  Pulse:  84  73  Resp:  18 18 18   Temp: 98.1 F (36.7 C) 97.9 F (36.6 C)  97.9 F (36.6 C)  TempSrc: Oral Oral Oral Oral  SpO2:  94%  95%  Weight:      Height:        Constitutional: NAD Eyes: lids and conjunctivae normal Respiratory: clear to auscultation bilaterally, no wheezing, no crackles. Normal respiratory effort. No accessory muscle use.  Cardiovascular: Regular rate and rhythm, no murmurs / rubs / gallops. No LE edema. 2+ pedal pulses.  Abdomen: + tenderness.  Neurologic: non focal    Data Reviewed: I have independently reviewed following labs and imaging studies   CBC:  Recent Labs Lab 01/26/17 1422 01/26/17 1810 01/27/17 0548  WBC 7.0 8.0 5.8  NEUTROABS 4.0 4.9  --   HGB 12.0 11.6* 11.1*  HCT 36.7 35.6* 35.0*  MCV 91.1 90.4 91.9  PLT 216 211 198   Basic  Metabolic Panel:  Recent Labs Lab 01/26/17 1422 01/26/17 1810  NA 142 142  K 3.7 3.7  CL 108 108  CO2 29 26  GLUCOSE 90 89  BUN 8 7  CREATININE 0.77 0.68  CALCIUM 8.5* 8.6*   GFR: Estimated Creatinine Clearance: 66.7 mL/min (by C-G formula based on SCr of 0.68 mg/dL). Liver Function Tests:  Recent Labs Lab 01/26/17 1422 01/26/17 1810  AST 12* 12*  ALT 11* 11*  ALKPHOS 83 76  BILITOT 0.6 0.7  PROT 6.7 6.2*  ALBUMIN 3.8 3.5   No results for input(s): LIPASE, AMYLASE in the last 168 hours. No results for input(s): AMMONIA in the last 168 hours. Coagulation Profile:  Recent Labs Lab 01/26/17 1810  INR 0.99   Cardiac Enzymes: No results for input(s): CKTOTAL, CKMB, CKMBINDEX, TROPONINI in the last 168 hours. BNP (last 3 results) No results for  input(s): PROBNP in the last 8760 hours. HbA1C: No results for input(s): HGBA1C in the last 72 hours. CBG: No results for input(s): GLUCAP in the last 168 hours. Lipid Profile: No results for input(s): CHOL, HDL, LDLCALC, TRIG, CHOLHDL, LDLDIRECT in the last 72 hours. Thyroid Function Tests: No results for input(s): TSH, T4TOTAL, FREET4, T3FREE, THYROIDAB in the last 72 hours. Anemia Panel: No results for input(s): VITAMINB12, FOLATE, FERRITIN, TIBC, IRON, RETICCTPCT in the last 72 hours. Urine analysis:    Component Value Date/Time   COLORURINE STRAW (A) 01/26/2017 1351   APPEARANCEUR CLEAR 01/26/2017 1351   LABSPEC 1.038 (H) 01/26/2017 1351   PHURINE 6.0 01/26/2017 1351   GLUCOSEU NEGATIVE 01/26/2017 1351   GLUCOSEU NEGATIVE 01/16/2017 1011   HGBUR NEGATIVE 01/26/2017 1351   BILIRUBINUR NEGATIVE 01/26/2017 1351   BILIRUBINUR neg 01/26/2017 1031   KETONESUR NEGATIVE 01/26/2017 1351   PROTEINUR NEGATIVE 01/26/2017 1351   UROBILINOGEN 0.2 01/26/2017 1031   UROBILINOGEN 0.2 01/16/2017 1011   NITRITE NEGATIVE 01/26/2017 1351   LEUKOCYTESUR SMALL (A) 01/26/2017 1351   Sepsis Labs: Invalid input(s): PROCALCITONIN, LACTICIDVEN  No results found for this or any previous visit (from the past 240 hour(s)).    Radiology Studies: Ct Abdomen Pelvis W Contrast  Result Date: 01/26/2017 CLINICAL DATA:  LEFT lower quadrant abdominal pain for 1 week, suspected diverticulitis, history colonic diverticulosis, hypertension, GERD, fibromyalgia EXAM: CT ABDOMEN AND PELVIS WITH CONTRAST TECHNIQUE: Multidetector CT imaging of the abdomen and pelvis was performed using the standard protocol following bolus administration of intravenous contrast. Sagittal and coronal MPR images reconstructed from axial data set. CONTRAST:  185mL ISOVUE-300 IOPAMIDOL (ISOVUE-300) INJECTION 61% IV. Dilute oral contrast. COMPARISON:  None available; prior CT from 02/12/2000 predates PACs and is not available for comparison  FINDINGS: Lower chest: Lung bases clear Hepatobiliary: Gallbladder surgically absent. Scattered hepatic cysts. No biliary dilatation. Pancreas: Normal appearance Spleen: Normal appearance Adrenals/Urinary Tract: Adrenal glands, kidneys, ureters, and bladder normal appearance Stomach/Bowel: Prior gastric stapling. Segment of jejuno-jejunal intussusception in the upper LEFT pelvis, approximate 3 cm length, without bowel obstruction. Long redundant sigmoid loop. Bowel loops otherwise unremarkable. No evidence of diverticulitis or definite visualization of colonic diverticula. Appendix surgically absent by history. Vascular/Lymphatic: Atherosclerotic calcifications aorta and iliac arteries without aneurysm. No adenopathy. Reproductive: Uterus surgically absent with nonvisualization of ovaries Other: No free air or free fluid. No inflammatory process or hernia. Musculoskeletal: Osseous demineralization.  No acute bony findings. IMPRESSION: Segment of jejuno-jejunal intussusception in the LEFT pelvis without evidence of bowel obstruction ; no lead point is visualized. Hepatic cysts. No other significant intra-abdominal or intrapelvic abnormalities.  Aortic Atherosclerosis (ICD10-I70.0). Electronically Signed   By: Lavonia Dana [name]   On: 01/26/2017 12:46   Dg Abd 2 Views  Result Date: 01/27/2017 CLINICAL DATA:  LEFT lower quadrant abdominal pain EXAM: ABDOMEN - 2 VIEW COMPARISON:  CT abdomen and pelvis 01/26/2017 FINDINGS: Retained contrast in colon. Prior gastric bypass procedure. Nonobstructive bowel gas pattern. No bowel dilatation, bowel wall thickening or evidence of obstruction. Osseous structures unremarkable. Few pelvic phleboliths. No urinary tract calcification. IMPRESSION: Nonobstructive bowel gas pattern. Electronically Signed   By: Lavonia Dana [name]   On: 01/27/2017 08:22     Scheduled Meds: . barium      . iopamidol      . ALIGN  1 capsule Oral Daily  . aspirin  81 mg Oral Daily  . cholecalciferol   2,000 Units Oral Daily  . fluticasone  2 spray Each Nare Daily  . vitamin B-12  500 mcg Oral Daily   Continuous Infusions: . sodium chloride    . sodium chloride 500 mL (01/26/17 1800)   Marzetta Board, MD, PhD [facility] Pager [phone]  If 7PM-7AM, please contact night-coverage [email or web address] Password [facility] 01/27/2017, 12:15 PM

## 2017-01-28 LAB — GLUCOSE, CAPILLARY: Glucose-Capillary: 89 mg/dL (ref 65–99)

## 2017-01-28 NOTE — Progress Notes (Signed)
PROGRESS NOTE  [name] KWI:[id number] DOB: [birth year] DOA: 01/26/2017 PCP: Cassandria Anger, MD   LOS: 1 day   Brief Narrative / Interim history: [name] is a 71 y.o. female with medical history significant for but not limited to history of multiple intra-abdominal operations (6), hypertension and diverticulosis with chronic constipation presenting with 1 week history of left lower quadrant abdominal pain progressively worsening over the last 1-2 days associated with mild nausea without vomiting, no fever or chills, no hematochezia or melena  Assessment & Plan: Principal Problem:   Intussusception ([place]) Active Problems:   Essential hypertension   Diverticulosis of colon   Jejuno - jejunal intussusception -confirmed on CT scan initiated on admission, however later CT enterography did not show any intussusception -general surgery consulted, appreciate input -Patient this morning with ongoing abdominal pain -in case she needs surgery, her GUPTA periop risk is 0.25%   Diverticulosis -without evidence of diverticulitis  Hypertension -Hold home anti-hypertensive medications -IV hydralazine when necessary for blood pressure control for now   DVT prophylaxis: SCD Code Status: Full code Family Communication: no family at bedside Disposition Plan: home when ready  Consultants:   General surgery  Procedures:   None   Antimicrobials:  None    Subjective: -Ongoing abdominal pain, no vomiting.  No chest pain or shortness of breath.  Objective: Vitals:   01/27/17 0535 01/27/17 1400 01/27/17 2135 01/28/17 0629  BP: 134/67  137/62 (!) 156/53  Pulse: 73 91 92 77  Resp: 18 18 18 18   Temp: 97.9 F (36.6 C) 98.3 F (36.8 C) 98.3 F (36.8 C) 98.2 F (36.8 C)  TempSrc: Oral Oral Oral Oral  SpO2: 95% 95% 94% 93%  Weight:      Height:        Intake/Output Summary (Last 24 hours) at 01/28/17 1233 Last data filed at 01/28/17 1100  Gross per 24 hour  Intake              1522 ml  Output             1600 ml  Net              -78 ml   Filed Weights   01/26/17 1800  Weight: 93.7 kg (206 lb 8 oz)    Examination:  Vitals:   01/27/17 0535 01/27/17 1400 01/27/17 2135 01/28/17 0629  BP: 134/67  137/62 (!) 156/53  Pulse: 73 91 92 77  Resp: 18 18 18 18   Temp: 97.9 F (36.6 C) 98.3 F (36.8 C) 98.3 F (36.8 C) 98.2 F (36.8 C)  TempSrc: Oral Oral Oral Oral  SpO2: 95% 95% 94% 93%  Weight:      Height:        Constitutional: NAD Respiratory: CTA biL Cardiovascular: RRR  Data Reviewed: I have independently reviewed following labs and imaging studies   CBC:  Recent Labs Lab 01/26/17 1422 01/26/17 1810 01/27/17 0548  WBC 7.0 8.0 5.8  NEUTROABS 4.0 4.9  --   HGB 12.0 11.6* 11.1*  HCT 36.7 35.6* 35.0*  MCV 91.1 90.4 91.9  PLT 216 211 242   Basic Metabolic Panel:  Recent Labs Lab 01/26/17 1422 01/26/17 1810  NA 142 142  K 3.7 3.7  CL 108 108  CO2 29 26  GLUCOSE 90 89  BUN 8 7  CREATININE 0.77 0.68  CALCIUM 8.5* 8.6*   GFR: Estimated Creatinine Clearance: 66.7 mL/min (by C-G formula based on SCr of 0.68 mg/dL). Liver  Function Tests:  Recent Labs Lab 01/26/17 1422 01/26/17 1810  AST 12* 12*  ALT 11* 11*  ALKPHOS 83 76  BILITOT 0.6 0.7  PROT 6.7 6.2*  ALBUMIN 3.8 3.5   No results for input(s): LIPASE, AMYLASE in the last 168 hours. No results for input(s): AMMONIA in the last 168 hours. Coagulation Profile:  Recent Labs Lab 01/26/17 1810  INR 0.99   Cardiac Enzymes: No results for input(s): CKTOTAL, CKMB, CKMBINDEX, TROPONINI in the last 168 hours. BNP (last 3 results) No results for input(s): PROBNP in the last 8760 hours. HbA1C: No results for input(s): HGBA1C in the last 72 hours. CBG:  Recent Labs Lab 01/27/17 0825  GLUCAP 89   Lipid Profile: No results for input(s): CHOL, HDL, LDLCALC, TRIG, CHOLHDL, LDLDIRECT in the last 72 hours. Thyroid Function Tests: No results for input(s): TSH,  T4TOTAL, FREET4, T3FREE, THYROIDAB in the last 72 hours. Anemia Panel: No results for input(s): VITAMINB12, FOLATE, FERRITIN, TIBC, IRON, RETICCTPCT in the last 72 hours. Urine analysis:    Component Value Date/Time   COLORURINE STRAW (A) 01/26/2017 1351   APPEARANCEUR CLEAR 01/26/2017 1351   LABSPEC 1.038 (H) 01/26/2017 1351   PHURINE 6.0 01/26/2017 1351   GLUCOSEU NEGATIVE 01/26/2017 1351   GLUCOSEU NEGATIVE 01/16/2017 1011   HGBUR NEGATIVE 01/26/2017 1351   BILIRUBINUR NEGATIVE 01/26/2017 1351   BILIRUBINUR neg 01/26/2017 1031   KETONESUR NEGATIVE 01/26/2017 1351   PROTEINUR NEGATIVE 01/26/2017 1351   UROBILINOGEN 0.2 01/26/2017 1031   UROBILINOGEN 0.2 01/16/2017 1011   NITRITE NEGATIVE 01/26/2017 1351   LEUKOCYTESUR SMALL (A) 01/26/2017 1351   Sepsis Labs: Invalid input(s): PROCALCITONIN, LACTICIDVEN  No results found for this or any previous visit (from the past 240 hour(s)).    Radiology Studies: Ct Abdomen Pelvis W Contrast  Result Date: 01/26/2017 CLINICAL DATA:  LEFT lower quadrant abdominal pain for 1 week, suspected diverticulitis, history colonic diverticulosis, hypertension, GERD, fibromyalgia EXAM: CT ABDOMEN AND PELVIS WITH CONTRAST TECHNIQUE: Multidetector CT imaging of the abdomen and pelvis was performed using the standard protocol following bolus administration of intravenous contrast. Sagittal and coronal MPR images reconstructed from axial data set. CONTRAST:  167mL ISOVUE-300 IOPAMIDOL (ISOVUE-300) INJECTION 61% IV. Dilute oral contrast. COMPARISON:  None available; prior CT from 02/12/2000 predates PACs and is not available for comparison FINDINGS: Lower chest: Lung bases clear Hepatobiliary: Gallbladder surgically absent. Scattered hepatic cysts. No biliary dilatation. Pancreas: Normal appearance Spleen: Normal appearance Adrenals/Urinary Tract: Adrenal glands, kidneys, ureters, and bladder normal appearance Stomach/Bowel: Prior gastric stapling. Segment of  jejuno-jejunal intussusception in the upper LEFT pelvis, approximate 3 cm length, without bowel obstruction. Long redundant sigmoid loop. Bowel loops otherwise unremarkable. No evidence of diverticulitis or definite visualization of colonic diverticula. Appendix surgically absent by history. Vascular/Lymphatic: Atherosclerotic calcifications aorta and iliac arteries without aneurysm. No adenopathy. Reproductive: Uterus surgically absent with nonvisualization of ovaries Other: No free air or free fluid. No inflammatory process or hernia. Musculoskeletal: Osseous demineralization.  No acute bony findings. IMPRESSION: Segment of jejuno-jejunal intussusception in the LEFT pelvis without evidence of bowel obstruction ; no lead point is visualized. Hepatic cysts. No other significant intra-abdominal or intrapelvic abnormalities. Aortic Atherosclerosis (ICD10-I70.0). Electronically Signed   By: Lavonia Dana [name]   On: 01/26/2017 12:46   Ct Entero Abd/pelvis W Contast  Result Date: 01/27/2017 CLINICAL DATA:  Left groin pain and back pain. History of prior jejunal intussusception on CT. EXAM: CT ABDOMEN AND PELVIS WITH CONTRAST (ENTEROGRAPHY) TECHNIQUE: Multidetector CT of the abdomen and  pelvis during bolus administration of intravenous contrast. Negative oral contrast was given. CONTRAST:  < 100 cc ISOVUE-300 IOPAMIDOL (ISOVUE-300) INJECTION 61% COMPARISON:  CT scan 01/26/2017 FINDINGS: Lower chest: The lung bases are grossly clear. Patchy areas of subsegmental atelectasis are noted. No pleural effusion. The heart is mildly enlarged but stable. Mild dilatation of the distal esophagus and surgical changes at the GE junction. Hepatobiliary: Stable hepatic cysts. No worrisome hepatic lesions. The gallbladder is surgically absent. No common bile duct dilatation. Pancreas: No mass, inflammation or ductal dilatation. Moderate atrophy. Spleen: Normal size.  No focal lesions. Adrenals/Urinary Tract: The adrenal glands and  kidneys are unremarkable. No renal, ureteral or bladder calculi or mass. Stomach/Bowel: The contrast on the prior CT scan has moved into the colon. No findings for obstruction. Surgical changes involving the stomach. The duodenum, small bowel and colon are grossly normal. No acute inflammatory process, mass lesions or obstructive findings. No intussusception is demonstrated. The terminal ileum appears normal. Vascular/Lymphatic: Stable aortic calcifications without aneurysm or dissection. The branch vessels are patent. The major venous structures are patent. No mesenteric or retroperitoneal mass or lymphadenopathy. Reproductive: Surgically absent.  Mild cystocele. Other: No pelvic mass or adenopathy. No free pelvic fluid collections. No inguinal mass or adenopathy. No abdominal wall hernia or subcutaneous lesions. Musculoskeletal: No significant bony findings. IMPRESSION: No acute abdominal/ pelvic findings, mass lesions or adenopathy. No findings for small bowel obstruction or acute inflammatory bowel process. No intussusception is demonstrated. Electronically Signed   By: Marijo Sanes [name]   On: 01/27/2017 15:33   Dg Abd 2 Views  Result Date: 01/27/2017 CLINICAL DATA:  LEFT lower quadrant abdominal pain EXAM: ABDOMEN - 2 VIEW COMPARISON:  CT abdomen and pelvis 01/26/2017 FINDINGS: Retained contrast in colon. Prior gastric bypass procedure. Nonobstructive bowel gas pattern. No bowel dilatation, bowel wall thickening or evidence of obstruction. Osseous structures unremarkable. Few pelvic phleboliths. No urinary tract calcification. IMPRESSION: Nonobstructive bowel gas pattern. Electronically Signed   By: Lavonia Dana [name]   On: 01/27/2017 08:22     Scheduled Meds: . ALIGN  1 capsule Oral Daily  . aspirin  81 mg Oral Daily  . cholecalciferol  2,000 Units Oral Daily  . fluticasone  2 spray Each Nare Daily  . pantoprazole  40 mg Oral Daily  . vitamin B-12  500 mcg Oral Daily   Continuous Infusions: .  sodium chloride    . sodium chloride 500 mL (01/26/17 1800)   Marzetta Board, MD, PhD [facility] Pager [phone]  If 7PM-7AM, please contact night-coverage [email or web address] Password [facility] 01/28/2017, 12:33 PM

## 2017-01-28 NOTE — Progress Notes (Signed)
Pt declined meds due at 2000 due to emesis. Medicated with Zofran for nausea and vomiting at this time.

## 2017-01-28 NOTE — Progress Notes (Signed)
[facility]/Trauma Progress Note      Assessment/Plan Principal Problem:   Intussusception ([place]) Active Problems:   Essential hypertension   Diverticulosis of colon  LLQ/groin pain - intussusception was suspected on initial imaging but repeat CT yesterday showed no acute abnormalities in the abd/pelvis - pt is having BM's and tolerating clears  FEN: clears VTE: SCD's ID: none Follow up: TBD  DISPO: Patient is not obstructed. Unclear as to why she is still having pain. If pain continues may require laparoscopic exploration. We will continue to follow.    LOS: 1 day    Subjective:  CC: LLQ abdominal pain  Pain is constant and mild. Gets severe at times. Not associated with BM's, flatus or food. One episode of clear emesis last night. No vomiting today. No fevers.  Objective: Vital signs in last 24 hours: Temp:  [98.2 F (36.8 C)-98.3 F (36.8 C)] 98.2 F (36.8 C) (08/13 0629) Pulse Rate:  [77-92] 77 (08/13 0629) Resp:  [18] 18 (08/13 0629) BP: (137-156)/(53-62) 156/53 (08/13 0629) SpO2:  [93 %-95 %] 93 % (08/13 0629) Last BM Date: 01/27/17  Intake/Output from previous day: 08/12 0701 - 08/13 0700 In: 1080 [P.O.:600; I.V.:480] Out: 1250 [Urine:1250] Intake/Output this shift: No intake/output data recorded.  PE: Gen:  Alert, NAD, pleasant, cooperative Card:  RRR, no M/G/R heard Pulm:  Rate and effort normal Abd: Soft, not distended +BS, no hernias appreciated, TTP in LLQ, remainder of abdomen benign Skin: no rashes noted, warm and dry   Anti-infectives: Anti-infectives    None      Lab Results:   Recent Labs  01/26/17 1810 01/27/17 0548  WBC 8.0 5.8  HGB 11.6* 11.1*  HCT 35.6* 35.0*  PLT 211 198   BMET  Recent Labs  01/26/17 1422 01/26/17 1810  NA 142 142  K 3.7 3.7  CL 108 108  CO2 29 26  GLUCOSE 90 89  BUN 8 7  CREATININE 0.77 0.68  CALCIUM 8.5* 8.6*   PT/INR  Recent Labs  01/26/17 1810  LABPROT 13.1  INR  0.99   CMP     Component Value Date/Time   NA 142 01/26/2017 1810   K 3.7 01/26/2017 1810   CL 108 01/26/2017 1810   CO2 26 01/26/2017 1810   GLUCOSE 89 01/26/2017 1810   BUN 7 01/26/2017 1810   CREATININE 0.68 01/26/2017 1810   CALCIUM 8.6 (L) 01/26/2017 1810   PROT 6.2 (L) 01/26/2017 1810   ALBUMIN 3.5 01/26/2017 1810   AST 12 (L) 01/26/2017 1810   ALT 11 (L) 01/26/2017 1810   ALKPHOS 76 01/26/2017 1810   BILITOT 0.7 01/26/2017 1810   GFRNONAA >60 01/26/2017 1810   GFRAA >60 01/26/2017 1810   Lipase  No results found for: LIPASE  Studies/Results: Ct Abdomen Pelvis W Contrast  Result Date: 01/26/2017 CLINICAL DATA:  LEFT lower quadrant abdominal pain for 1 week, suspected diverticulitis, history colonic diverticulosis, hypertension, GERD, fibromyalgia EXAM: CT ABDOMEN AND PELVIS WITH CONTRAST TECHNIQUE: Multidetector CT imaging of the abdomen and pelvis was performed using the standard protocol following bolus administration of intravenous contrast. Sagittal and coronal MPR images reconstructed from axial data set. CONTRAST:  173mL ISOVUE-300 IOPAMIDOL (ISOVUE-300) INJECTION 61% IV. Dilute oral contrast. COMPARISON:  None available; prior CT from 02/12/2000 predates PACs and is not available for comparison FINDINGS: Lower chest: Lung bases clear Hepatobiliary: Gallbladder surgically absent. Scattered hepatic cysts. No biliary dilatation. Pancreas: Normal appearance Spleen: Normal appearance Adrenals/Urinary Tract: Adrenal glands, kidneys, ureters,  and bladder normal appearance Stomach/Bowel: Prior gastric stapling. Segment of jejuno-jejunal intussusception in the upper LEFT pelvis, approximate 3 cm length, without bowel obstruction. Long redundant sigmoid loop. Bowel loops otherwise unremarkable. No evidence of diverticulitis or definite visualization of colonic diverticula. Appendix surgically absent by history. Vascular/Lymphatic: Atherosclerotic calcifications aorta and iliac arteries  without aneurysm. No adenopathy. Reproductive: Uterus surgically absent with nonvisualization of ovaries Other: No free air or free fluid. No inflammatory process or hernia. Musculoskeletal: Osseous demineralization.  No acute bony findings. IMPRESSION: Segment of jejuno-jejunal intussusception in the LEFT pelvis without evidence of bowel obstruction ; no lead point is visualized. Hepatic cysts. No other significant intra-abdominal or intrapelvic abnormalities. Aortic Atherosclerosis (ICD10-I70.0). Electronically Signed   By: Lavonia Dana [name]   On: 01/26/2017 12:46   Ct Entero Abd/pelvis W Contast  Result Date: 01/27/2017 CLINICAL DATA:  Left groin pain and back pain. History of prior jejunal intussusception on CT. EXAM: CT ABDOMEN AND PELVIS WITH CONTRAST (ENTEROGRAPHY) TECHNIQUE: Multidetector CT of the abdomen and pelvis during bolus administration of intravenous contrast. Negative oral contrast was given. CONTRAST:  < 100 cc ISOVUE-300 IOPAMIDOL (ISOVUE-300) INJECTION 61% COMPARISON:  CT scan 01/26/2017 FINDINGS: Lower chest: The lung bases are grossly clear. Patchy areas of subsegmental atelectasis are noted. No pleural effusion. The heart is mildly enlarged but stable. Mild dilatation of the distal esophagus and surgical changes at the GE junction. Hepatobiliary: Stable hepatic cysts. No worrisome hepatic lesions. The gallbladder is surgically absent. No common bile duct dilatation. Pancreas: No mass, inflammation or ductal dilatation. Moderate atrophy. Spleen: Normal size.  No focal lesions. Adrenals/Urinary Tract: The adrenal glands and kidneys are unremarkable. No renal, ureteral or bladder calculi or mass. Stomach/Bowel: The contrast on the prior CT scan has moved into the colon. No findings for obstruction. Surgical changes involving the stomach. The duodenum, small bowel and colon are grossly normal. No acute inflammatory process, mass lesions or obstructive findings. No intussusception is  demonstrated. The terminal ileum appears normal. Vascular/Lymphatic: Stable aortic calcifications without aneurysm or dissection. The branch vessels are patent. The major venous structures are patent. No mesenteric or retroperitoneal mass or lymphadenopathy. Reproductive: Surgically absent.  Mild cystocele. Other: No pelvic mass or adenopathy. No free pelvic fluid collections. No inguinal mass or adenopathy. No abdominal wall hernia or subcutaneous lesions. Musculoskeletal: No significant bony findings. IMPRESSION: No acute abdominal/ pelvic findings, mass lesions or adenopathy. No findings for small bowel obstruction or acute inflammatory bowel process. No intussusception is demonstrated. Electronically Signed   By: Marijo Sanes [name]   On: 01/27/2017 15:33   Dg Abd 2 Views  Result Date: 01/27/2017 CLINICAL DATA:  LEFT lower quadrant abdominal pain EXAM: ABDOMEN - 2 VIEW COMPARISON:  CT abdomen and pelvis 01/26/2017 FINDINGS: Retained contrast in colon. Prior gastric bypass procedure. Nonobstructive bowel gas pattern. No bowel dilatation, bowel wall thickening or evidence of obstruction. Osseous structures unremarkable. Few pelvic phleboliths. No urinary tract calcification. IMPRESSION: Nonobstructive bowel gas pattern. Electronically Signed   By: Lavonia Dana [name]   On: 01/27/2017 08:22      Kalman Drape , [facility] Surgery 01/28/2017, 9:09 AM Pager: [phone] Consults: [phone] Mon-Fri 7:00 am-4:30 pm Sat-Sun 7:00 am-11:30 am

## 2017-01-29 ENCOUNTER — Other Ambulatory Visit: Payer: Self-pay | Admitting: General Practice

## 2017-01-29 LAB — GLUCOSE, CAPILLARY: Glucose-Capillary: 151 mg/dL — ABNORMAL HIGH (ref 65–99)

## 2017-01-29 MED ORDER — LOVASTATIN 40 MG PO TABS: 40.0000 mg | ORAL_TABLET | Freq: Every day | ORAL | 1 refills | Status: DC

## 2017-01-29 NOTE — Progress Notes (Signed)
PROGRESS NOTE  [name] HTD:[id number] DOB: 20-Aug-1945 DOA: 01/26/2017 PCP: Cassandria Anger, MD   LOS: 2 days   Brief Narrative / Interim history: [name] is a 71 y.o. female with medical history significant for but not limited to history of multiple intra-abdominal operations (6), hypertension and diverticulosis with chronic constipation presenting with 1 week history of left lower quadrant abdominal pain progressively worsening over the last 1-2 days associated with mild nausea without vomiting, no fever or chills, no hematochezia or melena.  Initial CT scan showed concern for jejunal jejunal intussusception.  General surgery consulted.  Repeat CT scan showed that that has resolved, however given patient's persistent symptoms surgery will go ahead and do a laparotomy on 8/15  Assessment & Plan: Principal Problem:   Intussusception [name]) Active Problems:   Essential hypertension   Diverticulosis of colon   Jejuno - jejunal intussusception -confirmed on CT scan initiated on admission, however later CT enterography did not show any intussusception -general surgery consulted, appreciate input -Patient this morning with ongoing abdominal pain -in case she needs surgery, her GUPTA periop risk is 0.25% -Will be taken to the OR tomorrow I/15 for laparoscopy or laparotomy  Diverticulosis -without evidence of diverticulitis  Hypertension -Hold home anti-hypertensive medications -IV hydralazine when necessary for blood pressure control for now   DVT prophylaxis: SCD Code Status: Full code Family Communication: no family at bedside Disposition Plan: home when ready  Consultants:   General surgery  Procedures:   None   Antimicrobials:  None    Subjective: -Nausea and vomiting still persistent, most recent emesis last night.  Abdominal pain is intermittent  Objective: Vitals:   01/27/17 2135 01/28/17 0629 01/28/17 2216 01/29/17 0622  BP: 137/62 (!) 156/53  132/66 (!) 145/61  Pulse: 92 77 76 88  Resp: 18 18 16 16   Temp: 98.3 F (36.8 C) 98.2 F (36.8 C) 98.6 F (37 C) 97.8 F (36.6 C)  TempSrc: Oral Oral Oral Oral  SpO2: 94% 93% 93% 94%  Weight:      Height:        Intake/Output Summary (Last 24 hours) at 01/29/17 1316 Last data filed at 01/29/17 1106  Gross per 24 hour  Intake             1900 ml  Output              650 ml  Net             1250 ml   Filed Weights   01/26/17 1800  Weight: 93.7 kg (206 lb 8 oz)    Examination:  Vitals:   01/27/17 2135 01/28/17 0629 01/28/17 2216 01/29/17 0622  BP: 137/62 (!) 156/53 132/66 (!) 145/61  Pulse: 92 77 76 88  Resp: 18 18 16 16   Temp: 98.3 F (36.8 C) 98.2 F (36.8 C) 98.6 F (37 C) 97.8 F (36.6 C)  TempSrc: Oral Oral Oral Oral  SpO2: 94% 93% 93% 94%  Weight:      Height:        Constitutional: NAD Respiratory: CTA biL Cardiovascular: RRR  Data Reviewed: I have independently reviewed following labs and imaging studies   CBC:  Recent Labs Lab 01/26/17 1422 01/26/17 1810 01/27/17 0548  WBC 7.0 8.0 5.8  NEUTROABS 4.0 4.9  --   HGB 12.0 11.6* 11.1*  HCT 36.7 35.6* 35.0*  MCV 91.1 90.4 91.9  PLT 216 211 726   Basic Metabolic Panel:  Recent Labs Lab 01/26/17  1422 01/26/17 1810  NA 142 142  K 3.7 3.7  CL 108 108  CO2 29 26  GLUCOSE 90 89  BUN 8 7  CREATININE 0.77 0.68  CALCIUM 8.5* 8.6*   GFR: Estimated Creatinine Clearance: 66.7 mL/min (by C-G formula based on SCr of 0.68 mg/dL). Liver Function Tests:  Recent Labs Lab 01/26/17 1422 01/26/17 1810  AST 12* 12*  ALT 11* 11*  ALKPHOS 83 76  BILITOT 0.6 0.7  PROT 6.7 6.2*  ALBUMIN 3.8 3.5   No results for input(s): LIPASE, AMYLASE in the last 168 hours. No results for input(s): AMMONIA in the last 168 hours. Coagulation Profile:  Recent Labs Lab 01/26/17 1810  INR 0.99   Cardiac Enzymes: No results for input(s): CKTOTAL, CKMB, CKMBINDEX, TROPONINI in the last 168 hours. BNP (last 3  results) No results for input(s): PROBNP in the last 8760 hours. HbA1C: No results for input(s): HGBA1C in the last 72 hours. CBG:  Recent Labs Lab 01/27/17 0825 01/29/17 0819  GLUCAP 89 151*   Lipid Profile: No results for input(s): CHOL, HDL, LDLCALC, TRIG, CHOLHDL, LDLDIRECT in the last 72 hours. Thyroid Function Tests: No results for input(s): TSH, T4TOTAL, FREET4, T3FREE, THYROIDAB in the last 72 hours. Anemia Panel: No results for input(s): VITAMINB12, FOLATE, FERRITIN, TIBC, IRON, RETICCTPCT in the last 72 hours. Urine analysis:    Component Value Date/Time   COLORURINE STRAW (A) 01/26/2017 1351   APPEARANCEUR CLEAR 01/26/2017 1351   LABSPEC 1.038 (H) 01/26/2017 1351   PHURINE 6.0 01/26/2017 1351   GLUCOSEU NEGATIVE 01/26/2017 1351   GLUCOSEU NEGATIVE 01/16/2017 1011   HGBUR NEGATIVE 01/26/2017 1351   BILIRUBINUR NEGATIVE 01/26/2017 1351   BILIRUBINUR neg 01/26/2017 1031   KETONESUR NEGATIVE 01/26/2017 1351   PROTEINUR NEGATIVE 01/26/2017 1351   UROBILINOGEN 0.2 01/26/2017 1031   UROBILINOGEN 0.2 01/16/2017 1011   NITRITE NEGATIVE 01/26/2017 1351   LEUKOCYTESUR SMALL (A) 01/26/2017 1351   Sepsis Labs: Invalid input(s): PROCALCITONIN, LACTICIDVEN  No results found for this or any previous visit (from the past 240 hour(s)).    Radiology Studies: No results found.   Scheduled Meds: . ALIGN  1 capsule Oral Daily  . aspirin  81 mg Oral Daily  . cholecalciferol  2,000 Units Oral Daily  . fluticasone  2 spray Each Nare Daily  . pantoprazole  40 mg Oral Daily  . vitamin B-12  500 mcg Oral Daily   Continuous Infusions: . sodium chloride 1,000 mL (01/29/17 0251)  . sodium chloride 500 mL (01/26/17 1800)   Marzetta Board, MD, PhD [facility] Pager [phone]  If 7PM-7AM, please contact night-coverage [email or web address] Password [email or web address] 01/29/2017, 1:16 PM

## 2017-01-29 NOTE — Progress Notes (Signed)
Patient ID: [name], female   DOB: 09-12-1945, 71 y.o.   MRN: [id number]     Subjective: Vomited clear liquids last night. Had some increased left lower quadrant pain again last night when she got sick. It is somewhat better this morning but still present.  Objective: Vital signs in last 24 hours: Temp:  [97.8 F (36.6 C)-98.6 F (37 C)] 97.8 F (36.6 C) (08/14 0622) Pulse Rate:  [76-88] 88 (08/14 0622) Resp:  [16] 16 (08/14 0622) BP: (132-145)/(61-66) 145/61 (08/14 0622) SpO2:  [93 %-94 %] 94 % (08/14 0622) Last BM Date: 01/27/17  Intake/Output from previous day: 08/13 0701 - 08/14 0700 In: 2302 [P.O.:942; I.V.:1360] Out: 1000 [Urine:1000] Intake/Output this shift: No intake/output data recorded.  General appearance: alert, cooperative and no distress GI: Obese. Mild left lower quadrant tenderness without guarding. No palpable mass.  Lab Results:   Recent Labs  01/26/17 1810 01/27/17 0548  WBC 8.0 5.8  HGB 11.6* 11.1*  HCT 35.6* 35.0*  PLT 211 198   BMET  Recent Labs  01/26/17 1422 01/26/17 1810  NA 142 142  K 3.7 3.7  CL 108 108  CO2 29 26  GLUCOSE 90 89  BUN 8 7  CREATININE 0.77 0.68  CALCIUM 8.5* 8.6*     Studies/Results: Ct Entero Abd/pelvis W Contast  Result Date: 01/27/2017 CLINICAL DATA:  Left groin pain and back pain. History of prior jejunal intussusception on CT. EXAM: CT ABDOMEN AND PELVIS WITH CONTRAST (ENTEROGRAPHY) TECHNIQUE: Multidetector CT of the abdomen and pelvis during bolus administration of intravenous contrast. Negative oral contrast was given. CONTRAST:  < 100 cc ISOVUE-300 IOPAMIDOL (ISOVUE-300) INJECTION 61% COMPARISON:  CT scan 01/26/2017 FINDINGS: Lower chest: The lung bases are grossly clear. Patchy areas of subsegmental atelectasis are noted. No pleural effusion. The heart is mildly enlarged but stable. Mild dilatation of the distal esophagus and surgical changes at the GE junction. Hepatobiliary: Stable hepatic cysts. No  worrisome hepatic lesions. The gallbladder is surgically absent. No common bile duct dilatation. Pancreas: No mass, inflammation or ductal dilatation. Moderate atrophy. Spleen: Normal size.  No focal lesions. Adrenals/Urinary Tract: The adrenal glands and kidneys are unremarkable. No renal, ureteral or bladder calculi or mass. Stomach/Bowel: The contrast on the prior CT scan has moved into the colon. No findings for obstruction. Surgical changes involving the stomach. The duodenum, small bowel and colon are grossly normal. No acute inflammatory process, mass lesions or obstructive findings. No intussusception is demonstrated. The terminal ileum appears normal. Vascular/Lymphatic: Stable aortic calcifications without aneurysm or dissection. The branch vessels are patent. The major venous structures are patent. No mesenteric or retroperitoneal mass or lymphadenopathy. Reproductive: Surgically absent.  Mild cystocele. Other: No pelvic mass or adenopathy. No free pelvic fluid collections. No inguinal mass or adenopathy. No abdominal wall hernia or subcutaneous lesions. Musculoskeletal: No significant bony findings. IMPRESSION: No acute abdominal/ pelvic findings, mass lesions or adenopathy. No findings for small bowel obstruction or acute inflammatory bowel process. No intussusception is demonstrated. Electronically Signed   By: Rudie Meyer [name]   On: 01/27/2017 15:33    Anti-infectives: Anti-infectives    None      Assessment/Plan: Persistent left lower quadrant abdominal pain and intermittent vomiting. This has been present for a couple of weeks. The pain is severe enough that the patient states it is intolerable at its current level. Admission CT scan showed a jejunal intussusception exactly at the site of her pain. Follow-up scan showed no abnormality. However she remains  markedly symptomatic and tender at the same site. I don't think any further imaging or workup would be helpful. I think our only  options are exploration versus continued observation. The patient feels again that the symptoms are intolerable. I therefore recommended proceeding with exploration with attempted laparoscopy but with her multiple previous operations likely laparotomy. Possible small bowel resection depending on findings. This was all discussed with the patient in detail. Discussed risks of anesthetic complications, bleeding, infection, bowel injury, anastomotic leakage and blood clots. All her questions were answered and she would like to proceed. We will schedule for tomorrow morning.     LOS: 2 days    Channel Papandrea T 01/29/2017

## 2017-01-30 ENCOUNTER — Inpatient Hospital Stay (HOSPITAL_COMMUNITY): Payer: Medicare Other | Admitting: Anesthesiology

## 2017-01-30 ENCOUNTER — Encounter (HOSPITAL_COMMUNITY): Payer: Self-pay | Admitting: Anesthesiology

## 2017-01-30 ENCOUNTER — Encounter (HOSPITAL_COMMUNITY)
Admission: EM | Disposition: A | Discharge status: Home / Self Care | Payer: Self-pay | Source: Home / Self Care | Attending: Internal Medicine

## 2017-01-30 DIAGNOSIS — K561 Intussusception: Principal | ICD-10-CM

## 2017-01-30 HISTORY — PX: LAPAROSCOPY: SHX197

## 2017-01-30 LAB — CBC WITH DIFFERENTIAL/PLATELET
Basophils Absolute: 0.1 10*3/uL (ref 0.0–0.1)
Basophils Relative: 1 %
EOS PCT: 3 %
Eosinophils Absolute: 0.2 10*3/uL (ref 0.0–0.7)
HCT: 39.7 % (ref 36.0–46.0)
Hemoglobin: 12.8 g/dL (ref 12.0–15.0)
LYMPHS ABS: 1.5 10*3/uL (ref 0.7–4.0)
LYMPHS PCT: 25 %
MCH: 29.4 pg (ref 26.0–34.0)
MCHC: 32.2 g/dL (ref 30.0–36.0)
MCV: 91.1 fL (ref 78.0–100.0)
MONO ABS: 0.4 10*3/uL (ref 0.1–1.0)
MONOS PCT: 6 %
Neutro Abs: 3.9 10*3/uL (ref 1.7–7.7)
Neutrophils Relative %: 65 %
PLATELETS: 220 10*3/uL (ref 150–400)
RBC: 4.36 MIL/uL (ref 3.87–5.11)
RDW: 13.6 % (ref 11.5–15.5)
WBC: 6 10*3/uL (ref 4.0–10.5)

## 2017-01-30 LAB — COMPREHENSIVE METABOLIC PANEL
ALT: 11 U/L — AB (ref 14–54)
ANION GAP: 7 (ref 5–15)
AST: 13 U/L — ABNORMAL LOW (ref 15–41)
Albumin: 3.9 g/dL (ref 3.5–5.0)
Alkaline Phosphatase: 84 U/L (ref 38–126)
BUN: 6 mg/dL (ref 6–20)
CHLORIDE: 106 mmol/L (ref 101–111)
CO2: 28 mmol/L (ref 22–32)
Calcium: 9 mg/dL (ref 8.9–10.3)
Creatinine, Ser: 0.73 mg/dL (ref 0.44–1.00)
Glucose, Bld: 100 mg/dL — ABNORMAL HIGH (ref 65–99)
POTASSIUM: 3.9 mmol/L (ref 3.5–5.1)
Sodium: 141 mmol/L (ref 135–145)
Total Bilirubin: 0.7 mg/dL (ref 0.3–1.2)
Total Protein: 7 g/dL (ref 6.5–8.1)

## 2017-01-30 LAB — GLUCOSE, CAPILLARY: Glucose-Capillary: 110 mg/dL — ABNORMAL HIGH (ref 65–99)

## 2017-01-30 LAB — MAGNESIUM: MAGNESIUM: 1.9 mg/dL (ref 1.7–2.4)

## 2017-01-30 LAB — SURGICAL PCR SCREEN
MRSA, PCR: NEGATIVE
STAPHYLOCOCCUS AUREUS: NEGATIVE

## 2017-01-30 SURGERY — LAPAROSCOPY, DIAGNOSTIC
Anesthesia: General | Site: Abdomen

## 2017-01-30 MED ORDER — KETAMINE HCL 10 MG/ML IJ SOLN
INTRAMUSCULAR | Status: DC | PRN
  Administered 2017-01-30: 10 mg via INTRAVENOUS
  Administered 2017-01-30 (×2): 20 mg via INTRAVENOUS

## 2017-01-30 MED ORDER — SCOPOLAMINE 1 MG/3DAYS TD PT72
MEDICATED_PATCH | TRANSDERMAL | Status: DC | PRN
  Administered 2017-01-30: 1 via TRANSDERMAL

## 2017-01-30 MED ORDER — FENTANYL CITRATE (PF) 100 MCG/2ML IJ SOLN
INTRAMUSCULAR | Status: AC
  Filled 2017-01-30: qty 2

## 2017-01-30 MED ORDER — SUGAMMADEX SODIUM 500 MG/5ML IV SOLN
INTRAVENOUS | Status: AC
  Filled 2017-01-30: qty 5

## 2017-01-30 MED ORDER — POLYMYXIN B-TRIMETHOPRIM 10000-0.1 UNIT/ML-% OP SOLN
1.0000 [drp] | Freq: Three times a day (TID) | OPHTHALMIC | Status: AC
  Administered 2017-01-30 – 2017-01-31 (×3): 1 [drp] via OPHTHALMIC
  Filled 2017-01-30: qty 10

## 2017-01-30 MED ORDER — FENTANYL CITRATE (PF) 100 MCG/2ML IJ SOLN
INTRAMUSCULAR | Status: DC | PRN
  Administered 2017-01-30 (×4): 50 ug via INTRAVENOUS

## 2017-01-30 MED ORDER — CEFOTETAN DISODIUM-DEXTROSE 2-2.08 GM-% IV SOLR
INTRAVENOUS | Status: AC
  Filled 2017-01-30: qty 50

## 2017-01-30 MED ORDER — HYDROMORPHONE HCL-NACL 0.5-0.9 MG/ML-% IV SOSY
PREFILLED_SYRINGE | INTRAVENOUS | Status: AC
  Administered 2017-01-30: 19:00:00
  Filled 2017-01-30: qty 2

## 2017-01-30 MED ORDER — LACTATED RINGERS IV SOLN
INTRAVENOUS | Status: DC | PRN
  Administered 2017-01-30 (×2): via INTRAVENOUS

## 2017-01-30 MED ORDER — PHENYLEPHRINE 40 MCG/ML (10ML) SYRINGE FOR IV PUSH (FOR BLOOD PRESSURE SUPPORT)
PREFILLED_SYRINGE | INTRAVENOUS | Status: DC | PRN
  Administered 2017-01-30: 80 ug via INTRAVENOUS
  Administered 2017-01-30: 120 ug via INTRAVENOUS
  Administered 2017-01-30: 80 ug via INTRAVENOUS

## 2017-01-30 MED ORDER — SCOPOLAMINE 1 MG/3DAYS TD PT72
MEDICATED_PATCH | TRANSDERMAL | Status: AC
  Filled 2017-01-30: qty 1

## 2017-01-30 MED ORDER — PROPOFOL 10 MG/ML IV BOLUS
INTRAVENOUS | Status: DC | PRN
  Administered 2017-01-30: 180 mg via INTRAVENOUS

## 2017-01-30 MED ORDER — SUGAMMADEX SODIUM 500 MG/5ML IV SOLN
INTRAVENOUS | Status: DC | PRN
  Administered 2017-01-30: 300 mg via INTRAVENOUS

## 2017-01-30 MED ORDER — BUPIVACAINE-EPINEPHRINE (PF) 0.25% -1:200000 IJ SOLN
INTRAMUSCULAR | Status: AC
  Filled 2017-01-30: qty 30

## 2017-01-30 MED ORDER — LIDOCAINE 2% (20 MG/ML) 5 ML SYRINGE
INTRAMUSCULAR | Status: DC | PRN
  Administered 2017-01-30: 80 mg via INTRAVENOUS

## 2017-01-30 MED ORDER — HYDROMORPHONE HCL-NACL 0.5-0.9 MG/ML-% IV SOSY
PREFILLED_SYRINGE | INTRAVENOUS | Status: AC
  Administered 2017-01-30: 20:00:00
  Filled 2017-01-30: qty 2

## 2017-01-30 MED ORDER — DEXTROSE 5 % IV SOLN
2.0000 g | INTRAVENOUS | Status: AC
  Administered 2017-01-30 (×2): 2 g via INTRAVENOUS
  Filled 2017-01-30 (×2): qty 2

## 2017-01-30 MED ORDER — MORPHINE SULFATE (PF) 2 MG/ML IV SOLN
1.0000 mg | INTRAVENOUS | Status: DC | PRN
  Administered 2017-01-31 (×4): 2 mg via INTRAVENOUS
  Filled 2017-01-30 (×4): qty 1

## 2017-01-30 MED ORDER — 0.9 % SODIUM CHLORIDE (POUR BTL) OPTIME
TOPICAL | Status: DC | PRN
  Administered 2017-01-30: 1000 mL

## 2017-01-30 MED ORDER — OXYCODONE HCL 5 MG PO TABS: 5.0000 mg | ORAL_TABLET | Freq: Once | ORAL | Status: DC | PRN

## 2017-01-30 MED ORDER — BUPIVACAINE-EPINEPHRINE (PF) 0.25% -1:200000 IJ SOLN
INTRAMUSCULAR | Status: DC | PRN
  Administered 2017-01-30: 12 mL

## 2017-01-30 MED ORDER — ONDANSETRON HCL 4 MG/2ML IJ SOLN
INTRAMUSCULAR | Status: AC
  Filled 2017-01-30: qty 2

## 2017-01-30 MED ORDER — DEXTROSE 5 % IV SOLN
2.0000 g | Freq: Once | INTRAVENOUS | Status: DC
  Filled 2017-01-30: qty 2

## 2017-01-30 MED ORDER — HYDROMORPHONE HCL-NACL 0.5-0.9 MG/ML-% IV SOSY
0.2500 mg | PREFILLED_SYRINGE | INTRAVENOUS | Status: DC | PRN
  Administered 2017-01-30 (×2): 0.25 mg via INTRAVENOUS
  Administered 2017-01-30 (×2): 0.5 mg via INTRAVENOUS

## 2017-01-30 MED ORDER — ROCURONIUM BROMIDE 10 MG/ML (PF) SYRINGE
PREFILLED_SYRINGE | INTRAVENOUS | Status: DC | PRN
  Administered 2017-01-30 (×2): 20 mg via INTRAVENOUS
  Administered 2017-01-30: 50 mg via INTRAVENOUS
  Administered 2017-01-30: 10 mg via INTRAVENOUS
  Administered 2017-01-30: 20 mg via INTRAVENOUS

## 2017-01-30 MED ORDER — LACTATED RINGERS IR SOLN
Status: DC | PRN
  Administered 2017-01-30: 1

## 2017-01-30 MED ORDER — KETOROLAC TROMETHAMINE 0.5 % OP SOLN
1.0000 [drp] | Freq: Three times a day (TID) | OPHTHALMIC | Status: AC | PRN
  Administered 2017-01-30: 1 [drp] via OPHTHALMIC
  Filled 2017-01-30: qty 5

## 2017-01-30 MED ORDER — OXYCODONE HCL 5 MG/5ML PO SOLN
5.0000 mg | Freq: Once | ORAL | Status: DC | PRN
  Filled 2017-01-30: qty 5

## 2017-01-30 MED ORDER — ROCURONIUM BROMIDE 50 MG/5ML IV SOSY
PREFILLED_SYRINGE | INTRAVENOUS | Status: AC
  Filled 2017-01-30: qty 5

## 2017-01-30 MED ORDER — BSS IO SOLN
15.0000 mL | Freq: Once | INTRAOCULAR | Status: AC
  Administered 2017-01-30: 15 mL
  Filled 2017-01-30: qty 15

## 2017-01-30 MED ORDER — DEXAMETHASONE SODIUM PHOSPHATE 10 MG/ML IJ SOLN
INTRAMUSCULAR | Status: DC | PRN
  Administered 2017-01-30: 10 mg via INTRAVENOUS

## 2017-01-30 SURGICAL SUPPLY — 34 items
ADH SKN CLS APL DERMABOND .7 (GAUZE/BANDAGES/DRESSINGS) ×2
APL SKNCLS STERI-STRIP NONHPOA (GAUZE/BANDAGES/DRESSINGS) ×4
BENZOIN TINCTURE PRP APPL 2/3 (GAUZE/BANDAGES/DRESSINGS) ×6 IMPLANT
BNDG ADH 5X4 AIR PERM ELC (GAUZE/BANDAGES/DRESSINGS) ×10
BNDG COHESIVE 4X5 WHT NS (GAUZE/BANDAGES/DRESSINGS) ×15 IMPLANT
CLOSURE STERI-STRIP 1/4X4 (GAUZE/BANDAGES/DRESSINGS) ×3 IMPLANT
CLOSURE WOUND 1/2 X4 (GAUZE/BANDAGES/DRESSINGS) ×1
COVER SURGICAL LIGHT HANDLE (MISCELLANEOUS) ×4 IMPLANT
DECANTER SPIKE VIAL GLASS SM (MISCELLANEOUS) IMPLANT
DERMABOND ADVANCED (GAUZE/BANDAGES/DRESSINGS) ×2
DERMABOND ADVANCED .7 DNX12 (GAUZE/BANDAGES/DRESSINGS) ×1 IMPLANT
ELECT REM PT RETURN 15FT ADLT (MISCELLANEOUS) ×4 IMPLANT
GLOVE BIOGEL PI IND STRL 7.0 (GLOVE) ×2 IMPLANT
GLOVE BIOGEL PI INDICATOR 7.0 (GLOVE) ×2
GOWN STRL REUS W/TWL LRG LVL3 (GOWN DISPOSABLE) ×4 IMPLANT
GOWN STRL REUS W/TWL XL LVL3 (GOWN DISPOSABLE) ×8 IMPLANT
IRRIG SUCT STRYKERFLOW 2 WTIP (MISCELLANEOUS) ×4
IRRIGATION SUCT STRKRFLW 2 WTP (MISCELLANEOUS) ×1 IMPLANT
KIT BASIN OR (CUSTOM PROCEDURE TRAY) ×1 IMPLANT
PACK COLON (CUSTOM PROCEDURE TRAY) ×3 IMPLANT
SHEARS HARMONIC ACE PLUS 36CM (ENDOMECHANICALS) IMPLANT
SLEEVE ENDOPATH XCEL 5M (ENDOMECHANICALS) ×12 IMPLANT
SOLUTION ANTI FOG 6CC (MISCELLANEOUS) ×1 IMPLANT
STRIP CLOSURE SKIN 1/2X4 (GAUZE/BANDAGES/DRESSINGS) ×2 IMPLANT
SUT VIC AB 4-0 PS2 27 (SUTURE) IMPLANT
TOWEL OR 17X26 10 PK STRL BLUE (TOWEL DISPOSABLE) ×4 IMPLANT
TRAY FOLEY CATH 14FRSI W/METER (CATHETERS) ×3 IMPLANT
TRAY FOLEY W/METER SILVER 16FR (SET/KITS/TRAYS/PACK) IMPLANT
TRAY LAPAROSCOPIC (CUSTOM PROCEDURE TRAY) ×1 IMPLANT
TROCAR ADV FIXATION 5X100MM (TROCAR) ×3 IMPLANT
TROCAR XCEL BLUNT TIP 100MML (ENDOMECHANICALS) ×1 IMPLANT
TROCAR XCEL NON-BLD 11X100MML (ENDOMECHANICALS) IMPLANT
TROCAR XCEL UNIV SLVE 11M 100M (ENDOMECHANICALS) IMPLANT
TUBING INSUF HEATED (TUBING) ×4 IMPLANT

## 2017-01-30 NOTE — Op Note (Signed)
Preoperative Diagnosis: LLQ abdominal pain [R10.32]   Postoprative Diagnosis: LLQ abdominal pain [R10.32]   Procedure: Procedure(s): LAPAROSCOPY DIAGNOSTIC WITH LAPAROSCOPIC LYSIS OF ADHESIONS, extensive, 2 hours   Surgeon: Excell Seltzer T   Assistants: none  Anesthesia:  General endotracheal anesthesia  Indications: patient is a 71 year old female with extensive previous open surgery including previous gastroplasty, takedown of gastroplasty, open cholecystectomy, appendectomy and C-section. She presents with 2 weeks of persistent left lower quadrant abdominal pain and initially a CT scan showing a small area of jejunal intussusception in the left lower quadrant. Follow-up CT scan shows resolution of the intussusception without any distinct abnormality of the small bowel but she has persistent pain and nausea with several days of bowel rest and medications. She feels her symptoms are intolerable. Because of this and the initial finding of an intussusception in the left lower quadrant I have offered proceeding with exploratory surgery with laparoscopy and possible laparotomy to carefully We discussed alternatives of symptomatic management and observation and the risks of surgery detailed elsewhere and she desired to proceed.    Procedure Detail:  Patient was brought to the operating room, placed in the supine position on the operating table, and general endotracheal anesthesia induced. Foley cath was placed. Orogastric tube was placed. She received preoperative IV antibiotics. The abdomen was widely sterilely prepped and draped. Patient timeout was performed and correct procedure verified. Access was obtained with a 5 mm Optiview trocar in the left upper quadrant. I clearly visualized going to the peritoneum but initially there were filmy adhesions surrounding the trocar. However under direct vision withblunt dissection with the camera I entered a large open area of peritoneal cavity more  inferiorly in the abdomen. Following this under direct vision I placed a 5 mm trocar in the right mid abdomen. Visualization showed the access trocar adjacent to some adhesions but no bowel nearby and no evidence of injury. Ultimately I placed 2 further 5 mm trochars in the left mid abdomen and left loer quadrant. Initial examination showed a long segment of mid small bowel without any adhesions. There were some adhesions distally in the ileum and I traced the ileum distally lysing a few adhesions from the anterior abdominal wall and interloop adhesions and then was able to identify clearly the ileocecal valve.There was a loop of terminal ileum still down to the anterior pelvic abdominal wall but it was a smooth loop and clearly no obstruction or abnormality of the bowel which I could see well and as this was well away from the jejunum and left lower quadrant pain and CT scan abnormality was I did not take this loop down. I then traced the bowel proximally which appeared completely normal and without adhesions up to about the ileal jejunal junction. At this point there were multiple adhesions between loops of small bowel and also between the proximal jejunum and the underside of the omentum and transverse colon which were adherent to the anterior abdominal wall. Under direct vision without using any  cautery and with careful sharp dissection a tedious adhesiolysis was performed eventually completely lysing all adhesions and going up to the ligament of Treitz which was clearly identified.  I then carefully traced and examined the bowel back from the ligament of Treitz to the ileocecal valve and then back proximally to the ligament of Treitz again. I could not identify any intrinsic abnormality in the bowel. There was no evidence of injury from the dissection. No bleeding. At this point all CO2 was evacuated and  trochars were removed. Skin incisions were closed with subcuticular Monocryl and Dermabond. Sponge needle  and instrument counts were correct.    Findings: Extensive proximal small bowel adhesions without intrinsic abnormality  Estimated Blood Loss:  Minimal         Drains: none  Blood Given: none          Specimens: none        Complications:  * No complications entered in OR log *         Disposition: PACU - hemodynamically stable.         Condition: stable

## 2017-01-30 NOTE — Progress Notes (Signed)
PROGRESS NOTE  [name] OEV:[id number] DOB: 21-Feb-1946 DOA: 01/26/2017 PCP: Karen Anger, MD   LOS: 3 days   Brief Narrative / Interim history: [name] is a 71 y.o. female with medical history significant for but not limited to history of multiple intra-abdominal operations (6), hypertension and diverticulosis with chronic constipation presenting with 1 week history of left lower quadrant abdominal pain progressively worsening over the last 1-2 days associated with mild nausea without vomiting, no fever or chills, no hematochezia or melena.  Initial CT scan showed concern for jejunal jejunal intussusception.  General surgery consulted.  Repeat CT scan showed that that has resolved, however given patient's persistent symptoms surgery will go ahead and do a laparotomy on 8/15  Assessment & Plan: Principal Problem:   Intussusception [facility]) Active Problems:   Essential hypertension   Diverticulosis of colon   Jejuno - jejunal intussusception -confirmed on CT scan initiated on admission, however later CT enterography did not show any intussusception -general surgery consulted, appreciate input -Patient this morning with ongoing abdominal pain -in case she needs surgery, her GUPTA periop risk is 0.25% -Will be taken to the OR today I/15 for laparoscopy or laparotomy  Diverticulosis -without evidence of diverticulitis  Hypertension -Hold home anti-hypertensive medications -IV hydralazine when necessary for blood pressure control for now   DVT prophylaxis: SCD Code Status: Full code Family Communication: no family at bedside Disposition Plan: home when ready  Consultants:   General surgery  Procedures:   None   Antimicrobials:  None    Subjective: -Nausea still present and clear. No vomiting. No abdominal pain. No fever no chills.  Objective: Vitals:   01/29/17 2100 01/30/17 0005 01/30/17 0556 01/30/17 1400  BP: (!) 172/71 133/78 (!) 142/77 (!) 158/60    Pulse: 68  71 73  Resp: 18  18 18   Temp: 97.9 F (36.6 C)  97.9 F (36.6 C) 98.5 F (36.9 C)  TempSrc: Oral  Oral Oral  SpO2: 95%  95% 95%  Weight:      Height:        Intake/Output Summary (Last 24 hours) at 01/30/17 1742 Last data filed at 01/30/17 1631  Gross per 24 hour  Intake             1850 ml  Output              330 ml  Net             1520 ml   Filed Weights   01/26/17 1800  Weight: 93.7 kg (206 lb 8 oz)    Examination:  Vitals:   01/29/17 2100 01/30/17 0005 01/30/17 0556 01/30/17 1400  BP: (!) 172/71 133/78 (!) 142/77 (!) 158/60  Pulse: 68  71 73  Resp: 18  18 18   Temp: 97.9 F (36.6 C)  97.9 F (36.6 C) 98.5 F (36.9 C)  TempSrc: Oral  Oral Oral  SpO2: 95%  95% 95%  Weight:      Height:        Constitutional: NAD Respiratory: CTA biL Cardiovascular: RRR  Data Reviewed: I have independently reviewed following labs and imaging studies   CBC:  Recent Labs Lab 01/26/17 1422 01/26/17 1810 01/27/17 0548 01/30/17 0826  WBC 7.0 8.0 5.8 6.0  NEUTROABS 4.0 4.9  --  3.9  HGB 12.0 11.6* 11.1* 12.8  HCT 36.7 35.6* 35.0* 39.7  MCV 91.1 90.4 91.9 91.1  PLT 216 211 198 829   Basic Metabolic Panel:  Recent Labs Lab 01/26/17 1422 01/26/17 1810 01/30/17 0826  NA 142 142 141  K 3.7 3.7 3.9  CL 108 108 106  CO2 29 26 28   GLUCOSE 90 89 100*  BUN 8 7 6   CREATININE 0.77 0.68 0.73  CALCIUM 8.5* 8.6* 9.0  MG  --   --  1.9   GFR: Estimated Creatinine Clearance: 66.7 mL/min (by C-G formula based on SCr of 0.73 mg/dL). Liver Function Tests:  Recent Labs Lab 01/26/17 1422 01/26/17 1810 01/30/17 0826  AST 12* 12* 13*  ALT 11* 11* 11*  ALKPHOS 83 76 84  BILITOT 0.6 0.7 0.7  PROT 6.7 6.2* 7.0  ALBUMIN 3.8 3.5 3.9   No results for input(s): LIPASE, AMYLASE in the last 168 hours. No results for input(s): AMMONIA in the last 168 hours. Coagulation Profile:  Recent Labs Lab 01/26/17 1810  INR 0.99   Cardiac Enzymes: No results for  input(s): CKTOTAL, CKMB, CKMBINDEX, TROPONINI in the last 168 hours. BNP (last 3 results) No results for input(s): PROBNP in the last 8760 hours. HbA1C: No results for input(s): HGBA1C in the last 72 hours. CBG:  Recent Labs Lab 01/27/17 0825 01/29/17 0819 01/30/17 0746  GLUCAP 89 151* 110*   Lipid Profile: No results for input(s): CHOL, HDL, LDLCALC, TRIG, CHOLHDL, LDLDIRECT in the last 72 hours. Thyroid Function Tests: No results for input(s): TSH, T4TOTAL, FREET4, T3FREE, THYROIDAB in the last 72 hours. Anemia Panel: No results for input(s): VITAMINB12, FOLATE, FERRITIN, TIBC, IRON, RETICCTPCT in the last 72 hours. Urine analysis:    Component Value Date/Time   COLORURINE STRAW (A) 01/26/2017 1351   APPEARANCEUR CLEAR 01/26/2017 1351   LABSPEC 1.038 (H) 01/26/2017 1351   PHURINE 6.0 01/26/2017 1351   GLUCOSEU NEGATIVE 01/26/2017 1351   GLUCOSEU NEGATIVE 01/16/2017 1011   HGBUR NEGATIVE 01/26/2017 1351   BILIRUBINUR NEGATIVE 01/26/2017 1351   BILIRUBINUR neg 01/26/2017 1031   KETONESUR NEGATIVE 01/26/2017 1351   PROTEINUR NEGATIVE 01/26/2017 1351   UROBILINOGEN 0.2 01/26/2017 1031   UROBILINOGEN 0.2 01/16/2017 1011   NITRITE NEGATIVE 01/26/2017 1351   LEUKOCYTESUR SMALL (A) 01/26/2017 1351   Sepsis Labs: Invalid input(s): PROCALCITONIN, LACTICIDVEN  Recent Results (from the past 240 hour(s))  Surgical pcr screen     Status: None   Collection Time: 01/30/17  8:55 AM  Result Value Ref Range Status   MRSA, PCR NEGATIVE NEGATIVE Final   Staphylococcus aureus NEGATIVE NEGATIVE Final    Comment:        The Xpert SA Assay (FDA approved for NASAL specimens in patients over 94 years of age), is one component of a comprehensive surveillance program.  Test performance has been validated by [facility] for patients greater than or equal to 9 year old. It is not intended to diagnose infection nor to guide or monitor treatment.       Radiology Studies: No results  found.   Scheduled Meds: . [MAR Hold] ALIGN  1 capsule Oral Daily  . [MAR Hold] aspirin  81 mg Oral Daily  . [MAR Hold] cholecalciferol  2,000 Units Oral Daily  . [MAR Hold] fluticasone  2 spray Each Nare Daily  . [MAR Hold] pantoprazole  40 mg Oral Daily  . [MAR Hold] vitamin B-12  500 mcg Oral Daily   Continuous Infusions: . sodium chloride 100 mL/hr at 01/29/17 1400  . sodium chloride 500 mL (01/30/17 0600)  . cefOXitin     Author:  Berle Mull, MD [facility] Pager: [phone] 01/30/2017  5:43 PM     If 7PM-7AM, please contact night-coverage [email or web address] Password [facility] 01/30/2017, 5:42 PM

## 2017-01-30 NOTE — Anesthesia Postprocedure Evaluation (Signed)
Anesthesia Post Note  Patient: [name]  Procedure(s) Performed: Procedure(s) (LRB): LAPAROSCOPY DIAGNOSTIC WITH LAPAROSCOPIC LYSIS OF ADHESIONS (N/A)     Patient location during evaluation: PACU Anesthesia Type: General Level of consciousness: awake and alert Pain management: pain level controlled Vital Signs Assessment: post-procedure vital signs reviewed and stable Respiratory status: spontaneous breathing, nonlabored ventilation, respiratory function stable and patient connected to nasal cannula oxygen Cardiovascular status: blood pressure returned to baseline and stable Postop Assessment: no signs of nausea or vomiting Anesthetic complications: yes Anesthetic complication details: possible corneal abrasion and injury of cornea   Last Vitals:  Vitals:   01/30/17 1952 01/30/17 2007  BP: (!) 130/43 (!) 151/71  Pulse: 84 98  Resp: 18 16  Temp: 36.6 C 36.8 C  SpO2: 96% 100%    Last Pain:  Vitals:   01/30/17 2007  TempSrc:   PainSc: 7                  Tiajuana Amass

## 2017-01-30 NOTE — Anesthesia Preprocedure Evaluation (Addendum)
Anesthesia Evaluation  Patient identified by MRN, date of birth, ID band Patient awake    Reviewed: Allergy & Precautions, NPO status , Patient's Chart, lab work & pertinent test results  History of Anesthesia Complications (+) PONV  Airway Mallampati: II  TM Distance: >3 FB Neck ROM: Full    Dental no notable dental hx. (+) Teeth Intact, Dental Advisory Given,    Pulmonary neg pulmonary ROS,    breath sounds clear to auscultation       Cardiovascular hypertension,  Rhythm:Regular Rate:Normal     Neuro/Psych    GI/Hepatic PUD, GERD  ,  Endo/Other    Renal/GU      Musculoskeletal  (+) Arthritis , Fibromyalgia -  Abdominal (+) + obese,   Peds  Hematology negative hematology ROS (+)   Anesthesia Other Findings   Reproductive/Obstetrics                           Anesthesia Physical Anesthesia Plan  ASA: II  Anesthesia Plan: General   Post-op Pain Management:    Induction: Intravenous  PONV Risk Score and Plan: 4 or greater and Ondansetron, Dexamethasone, Scopolamine patch - Pre-op, Propofol infusion and Treatment may vary due to age or medical condition  Airway Management Planned: Oral ETT  Additional Equipment:   Intra-op Plan:   Post-operative Plan: Extubation in OR  Informed Consent: I have reviewed the patients History and Physical, chart, labs and discussed the procedure including the risks, benefits and alternatives for the proposed anesthesia with the patient or authorized representative who has indicated his/her understanding and acceptance.   Dental advisory given  Plan Discussed with: CRNA  Anesthesia Plan Comments:         Anesthesia Quick Evaluation

## 2017-01-30 NOTE — Transfer of Care (Signed)
Immediate Anesthesia Transfer of Care Note  Patient: [name]  Procedure(s) Performed: Procedure(s): LAPAROSCOPY DIAGNOSTIC WITH LAPAROSCOPIC LYSIS OF ADHESIONS (N/A)  Patient Location: PACU  Anesthesia Type:General  Level of Consciousness:  sedated, patient cooperative and responds to stimulation  Airway & Oxygen Therapy:Patient Spontanous Breathing and Patient connected to face mask oxgen  Post-op Assessment:  Report given to PACU RN and Post -op Vital signs reviewed and stable  Post vital signs:  Reviewed and stable  Last Vitals:  Vitals:   01/30/17 0556 01/30/17 1400  BP: (!) 142/77 (!) 158/60  Pulse: 71 73  Resp: 18 18  Temp: 36.6 C 36.9 C  SpO2: 16% 10%    Complications: No apparent anesthesia complications

## 2017-01-30 NOTE — Anesthesia Procedure Notes (Addendum)
Procedure Name: Intubation Date/Time: 01/30/2017 3:19 PM Performed by: West Pugh Pre-anesthesia Checklist: Patient identified, Emergency Drugs available, Suction available and Patient being monitored Patient Re-evaluated:Patient Re-evaluated prior to induction Oxygen Delivery Method: Circle system utilized Preoxygenation: Pre-oxygenation with 100% oxygen Induction Type: IV induction Ventilation: Mask ventilation without difficulty Laryngoscope Size: Glidescope and 3 Grade View: Grade I Tube type: Oral Tube size: 7.0 mm Number of attempts: 1 Airway Equipment and Method: Rigid stylet and Video-laryngoscopy Placement Confirmation: ETT inserted through vocal cords under direct vision,  positive ETCO2 and breath sounds checked- equal and bilateral Secured at: 21 cm Tube secured with: Tape Dental Injury: Teeth and Oropharynx as per pre-operative assessment  Comments: Elective glidescope use.

## 2017-01-31 ENCOUNTER — Encounter (HOSPITAL_COMMUNITY): Payer: Self-pay | Admitting: General Surgery

## 2017-01-31 LAB — COMPREHENSIVE METABOLIC PANEL
ALK PHOS: 69 U/L (ref 38–126)
ALT: 10 U/L — ABNORMAL LOW (ref 14–54)
ANION GAP: 5 (ref 5–15)
AST: 13 U/L — ABNORMAL LOW (ref 15–41)
Albumin: 3.3 g/dL — ABNORMAL LOW (ref 3.5–5.0)
BUN: 8 mg/dL (ref 6–20)
CALCIUM: 8.3 mg/dL — AB (ref 8.9–10.3)
CO2: 29 mmol/L (ref 22–32)
Chloride: 107 mmol/L (ref 101–111)
Creatinine, Ser: 0.74 mg/dL (ref 0.44–1.00)
GFR calc non Af Amer: >60 mL/min (ref >60)
Glucose, Bld: 102 mg/dL — ABNORMAL HIGH (ref 65–99)
Potassium: 3.8 mmol/L (ref 3.5–5.1)
SODIUM: 141 mmol/L (ref 135–145)
TOTAL PROTEIN: 5.9 g/dL — AB (ref 6.5–8.1)
Total Bilirubin: 0.5 mg/dL (ref 0.3–1.2)

## 2017-01-31 LAB — CBC WITH DIFFERENTIAL/PLATELET
Basophils Absolute: 0 10*3/uL (ref 0.0–0.1)
Basophils Relative: 0 %
EOS ABS: 0 10*3/uL (ref 0.0–0.7)
EOS PCT: 0 %
HCT: 35.9 % — ABNORMAL LOW (ref 36.0–46.0)
HEMOGLOBIN: 11.5 g/dL — AB (ref 12.0–15.0)
LYMPHS ABS: 1.6 10*3/uL (ref 0.7–4.0)
Lymphocytes Relative: 14 %
MCH: 29.2 pg (ref 26.0–34.0)
MCHC: 32 g/dL (ref 30.0–36.0)
MCV: 91.1 fL (ref 78.0–100.0)
MONOS PCT: 6 %
Monocytes Absolute: 0.7 10*3/uL (ref 0.1–1.0)
Neutro Abs: 9 10*3/uL — ABNORMAL HIGH (ref 1.7–7.7)
Neutrophils Relative %: 80 %
Platelets: 212 10*3/uL (ref 150–400)
RBC: 3.94 MIL/uL (ref 3.87–5.11)
RDW: 13.5 % (ref 11.5–15.5)
WBC: 11.2 10*3/uL — AB (ref 4.0–10.5)

## 2017-01-31 LAB — GLUCOSE, CAPILLARY: GLUCOSE-CAPILLARY: 84 mg/dL (ref 65–99)

## 2017-01-31 LAB — MAGNESIUM: MAGNESIUM: 1.9 mg/dL (ref 1.7–2.4)

## 2017-01-31 MED ORDER — IBUPROFEN 200 MG PO TABS: 400.0000 mg | ORAL_TABLET | Freq: Four times a day (QID) | ORAL | Status: DC | PRN

## 2017-01-31 MED ORDER — LUBIPROSTONE 24 MCG PO CAPS
24.0000 ug | ORAL_CAPSULE | Freq: Two times a day (BID) | ORAL | Status: DC
  Administered 2017-01-31 – 2017-02-03 (×6): 24 ug via ORAL
  Filled 2017-01-31 (×7): qty 1

## 2017-01-31 MED ORDER — OXYCODONE HCL 5 MG PO TABS
5.0000 mg | ORAL_TABLET | ORAL | Status: DC | PRN
  Administered 2017-02-01 – 2017-02-03 (×10): 5 mg via ORAL
  Filled 2017-01-31 (×10): qty 1

## 2017-01-31 MED ORDER — ACETAMINOPHEN 500 MG PO TABS
500.0000 mg | ORAL_TABLET | Freq: Four times a day (QID) | ORAL | Status: DC | PRN
  Administered 2017-02-01 – 2017-02-03 (×6): 500 mg via ORAL
  Filled 2017-01-31 (×6): qty 1

## 2017-01-31 NOTE — Care Management Important Message (Signed)
Important Message  Patient Details  Name: BRANDEY VANDALEN MRN: [id number] Date of Birth: 10-01-1945   Medicare Important Message Given:  Yes    Avni, Traore 01/31/2017, 10:36 AMImportant Message  Patient Details  Name: BAELEIGH DEVINCENT MRN: [id number] Date of Birth: [birth year]   Medicare Important Message Given:  Yes    Shemekia, Patane 01/31/2017, 10:36 AM

## 2017-01-31 NOTE — Progress Notes (Signed)
[facility] Progress Note  1 Day Post-Op  Subjective: CC: abdomen feels sore Patient states abdomen feels sore, but pain is improved overall. Has not gotten OOB yet. Denies n/v, has had some juice this AM. No flatus yet.   Objective: Vital signs in last 24 hours: Temp:  [97.7 F (36.5 C)-98.8 F (37.1 C)] 98.7 F (37.1 C) (08/16 1000) Pulse Rate:  [72-104] 79 (08/16 1000) Resp:  [13-27] 19 (08/16 1000) BP: (111-158)/(43-101) 128/66 (08/16 1000) SpO2:  [93 %-100 %] 99 % (08/16 1000) Last BM Date: 01/27/17  Intake/Output from previous day: 08/15 0701 - 08/16 0700 In: 2480 [I.V.:2480] Out: 880 [Urine:880] Intake/Output this shift: Total I/O In: -  Out: 700 [Urine:700]  PE: Gen:  Alert, NAD, pleasant Card:  Regular rate and rhythm, pedal pulses 2+ BL Pulm:  Normal effort, clear to auscultation bilaterally Abd: Soft, non-tender, non-distended, bowel sounds present in all 4 quadrants, no HSM, incisions C/D/I Skin: warm and dry, no rashes  Psych: A&Ox3   Lab Results:   Recent Labs  01/30/17 0826 01/31/17 0609  WBC 6.0 11.2*  HGB 12.8 11.5*  HCT 39.7 35.9*  PLT 220 212   BMET  Recent Labs  01/30/17 0826 01/31/17 0609  NA 141 141  K 3.9 3.8  CL 106 107  CO2 28 29  GLUCOSE 100* 102*  BUN 6 8  CREATININE 0.73 0.74  CALCIUM 9.0 8.3*   PT/INR No results for input(s): LABPROT, INR in the last 72 hours. CMP     Component Value Date/Time   NA 141 01/31/2017 0609   K 3.8 01/31/2017 0609   CL 107 01/31/2017 0609   CO2 29 01/31/2017 0609   GLUCOSE 102 (H) 01/31/2017 0609   BUN 8 01/31/2017 0609   CREATININE 0.74 01/31/2017 0609   CALCIUM 8.3 (L) 01/31/2017 0609   PROT 5.9 (L) 01/31/2017 0609   ALBUMIN 3.3 (L) 01/31/2017 0609   AST 13 (L) 01/31/2017 0609   ALT 10 (L) 01/31/2017 0609   ALKPHOS 69 01/31/2017 0609   BILITOT 0.5 01/31/2017 0609   GFRNONAA >60 01/31/2017 0609   GFRAA >60 01/31/2017 0609   Lipase  No results found for:  LIPASE     Studies/Results: No results found.  Anti-infectives: Anti-infectives    Start     Dose/Rate Route Frequency Ordered Stop   01/30/17 1730  cefOXitin (MEFOXIN) 2 g in dextrose 5 % 50 mL IVPB     2 g 100 mL/hr over 30 Minutes Intravenous  Once 01/30/17 1725     01/30/17 1439  cefoTEtan in Dextrose 5% (CEFOTAN) 2-2.08 GM-% IVPB  Status:  Discontinued    Comments:  Key, Kristopher   : cabinet override      01/30/17 1439 01/30/17 1517   01/30/17 0600  cefOXitin (MEFOXIN) 2 g in dextrose 5 % 50 mL IVPB     2 g 100 mL/hr over 30 Minutes Intravenous On call to O.R. 01/30/17 0423 01/30/17 1740       Assessment/Plan LLQ/groin pain S/P ex lap with LOA - 8/15 Dr. Excell Seltzer - intussusception was suspected on initial imaging but repeat CT 8/12 showed no acute abnormalities in the abd/pelvis - abdominal exam appropriate - discontinue foley - mobilize OOB - will order PO pain medication; patient takes amitiza at home for help with BMs, will reorder.   FEN: clears VTE: SCD's ID: cefoxitin and cefotan periop Follow up: TBD  DISPO: Keep on clears, mobilize. Await return of bowel function.  LOS: 4 days    Brigid Re , [facility] 01/31/2017, 11:00 AM Pager: [phone] Consults: [phone] Mon-Fri 7:00 am-4:30 pm Sat-Sun 7:00 am-11:30 am

## 2017-01-31 NOTE — Progress Notes (Signed)
PROGRESS NOTE  [name] ZSW:[id number] DOB: 02/04/1946 DOA: 01/26/2017 PCP: Cassandria Anger, MD   LOS: 4 days   Brief Narrative / Interim history: [name] is a 71 y.o. female with medical history significant for but not limited to history of multiple intra-abdominal operations (6), hypertension and diverticulosis with chronic constipation presenting with 1 week history of left lower quadrant abdominal pain progressively worsening over the last 1-2 days associated with mild nausea without vomiting, no fever or chills, no hematochezia or melena.  Initial CT scan showed concern for jejunal jejunal intussusception.  General surgery consulted.  Repeat CT scan showed that that has resolved, however given patient's persistent symptoms surgery will go ahead and do a laparotomy on 8/15  Assessment & Plan: Principal Problem:   Intussusception [facility]) Active Problems:   Essential hypertension   Diverticulosis of colon   Jejuno - jejunal intussusception -confirmed on CT scan initiated on admission, however later CT enterography did not show any intussusception -general surgery consulted, appreciate input SP exploratory laparoscopy with lysis of adhesions on 8:15. Minimal bowel sounds. Upper PIP postoperative recovery. Patient has sustained some corneal abrasion from dry eyes management per surgery.  Diverticulosis -without evidence of diverticulitis  Hypertension -Hold home anti-hypertensive medications -IV hydralazine when necessary for blood pressure control for now   DVT prophylaxis: SCD Code Status: Full code Family Communication: family at bedside Disposition Plan: home when ready  Consultants:   General surgery  Procedures:   Laparoscopic lysis of adhesion  Antimicrobials:  None    Subjective: Feeling better. On nausea no vomiting. Not passing gas.  Objective: Vitals:   01/31/17 0309 01/31/17 0603 01/31/17 1000 01/31/17 1400  BP: (!) 138/59 131/63 128/66  132/62  Pulse: 72 88 79 75  Resp: 18 18 19 18   Temp: 98.8 F (37.1 C) 98.6 F (37 C) 98.7 F (37.1 C) 98 F (36.7 C)  TempSrc: Oral Oral Oral Oral  SpO2: 95% 97% 99% 99%  Weight:      Height:        Intake/Output Summary (Last 24 hours) at 01/31/17 1449 Last data filed at 01/31/17 1400  Gross per 24 hour  Intake             2720 ml  Output             1680 ml  Net             1040 ml   Filed Weights   01/26/17 1800  Weight: 93.7 kg (206 lb 8 oz)    Examination:  Vitals:   01/31/17 0309 01/31/17 0603 01/31/17 1000 01/31/17 1400  BP: (!) 138/59 131/63 128/66 132/62  Pulse: 72 88 79 75  Resp: 18 18 19 18   Temp: 98.8 F (37.1 C) 98.6 F (37 C) 98.7 F (37.1 C) 98 F (36.7 C)  TempSrc: Oral Oral Oral Oral  SpO2: 95% 97% 99% 99%  Weight:      Height:        Constitutional: NAD Respiratory: CTA biL Cardiovascular: RRR  Data Reviewed: I have independently reviewed following labs and imaging studies   CBC:  Recent Labs Lab 01/26/17 1422 01/26/17 1810 01/27/17 0548 01/30/17 0826 01/31/17 0609  WBC 7.0 8.0 5.8 6.0 11.2*  NEUTROABS 4.0 4.9  --  3.9 9.0*  HGB 12.0 11.6* 11.1* 12.8 11.5*  HCT 36.7 35.6* 35.0* 39.7 35.9*  MCV 91.1 90.4 91.9 91.1 91.1  PLT 216 211 198 220 322   Basic Metabolic  Panel:  Recent Labs Lab 01/26/17 1422 01/26/17 1810 01/30/17 0826 01/31/17 0609  NA 142 142 141 141  K 3.7 3.7 3.9 3.8  CL 108 108 106 107  CO2 29 26 28 29   GLUCOSE 90 89 100* 102*  BUN 8 7 6 8   CREATININE 0.77 0.68 0.73 0.74  CALCIUM 8.5* 8.6* 9.0 8.3*  MG  --   --  1.9 1.9   GFR: Estimated Creatinine Clearance: 66.7 mL/min (by C-G formula based on SCr of 0.74 mg/dL). Liver Function Tests:  Recent Labs Lab 01/26/17 1422 01/26/17 1810 01/30/17 0826 01/31/17 0609  AST 12* 12* 13* 13*  ALT 11* 11* 11* 10*  ALKPHOS 83 76 84 69  BILITOT 0.6 0.7 0.7 0.5  PROT 6.7 6.2* 7.0 5.9*  ALBUMIN 3.8 3.5 3.9 3.3*   No results for input(s): LIPASE, AMYLASE in  the last 168 hours. No results for input(s): AMMONIA in the last 168 hours. Coagulation Profile:  Recent Labs Lab 01/26/17 1810  INR 0.99   Cardiac Enzymes: No results for input(s): CKTOTAL, CKMB, CKMBINDEX, TROPONINI in the last 168 hours. BNP (last 3 results) No results for input(s): PROBNP in the last 8760 hours. HbA1C: No results for input(s): HGBA1C in the last 72 hours. CBG:  Recent Labs Lab 01/27/17 0825 01/29/17 0819 01/30/17 0746 01/31/17 0748  GLUCAP 89 151* 110* 84   Lipid Profile: No results for input(s): CHOL, HDL, LDLCALC, TRIG, CHOLHDL, LDLDIRECT in the last 72 hours. Thyroid Function Tests: No results for input(s): TSH, T4TOTAL, FREET4, T3FREE, THYROIDAB in the last 72 hours. Anemia Panel: No results for input(s): VITAMINB12, FOLATE, FERRITIN, TIBC, IRON, RETICCTPCT in the last 72 hours. Urine analysis:    Component Value Date/Time   COLORURINE STRAW (A) 01/26/2017 1351   APPEARANCEUR CLEAR 01/26/2017 1351   LABSPEC 1.038 (H) 01/26/2017 1351   PHURINE 6.0 01/26/2017 1351   GLUCOSEU NEGATIVE 01/26/2017 1351   GLUCOSEU NEGATIVE 01/16/2017 1011   HGBUR NEGATIVE 01/26/2017 1351   BILIRUBINUR NEGATIVE 01/26/2017 1351   BILIRUBINUR neg 01/26/2017 1031   KETONESUR NEGATIVE 01/26/2017 1351   PROTEINUR NEGATIVE 01/26/2017 1351   UROBILINOGEN 0.2 01/26/2017 1031   UROBILINOGEN 0.2 01/16/2017 1011   NITRITE NEGATIVE 01/26/2017 1351   LEUKOCYTESUR SMALL (A) 01/26/2017 1351   Sepsis Labs: Invalid input(s): PROCALCITONIN, LACTICIDVEN  Recent Results (from the past 240 hour(s))  Surgical pcr screen     Status: None   Collection Time: 01/30/17  8:55 AM  Result Value Ref Range Status   MRSA, PCR NEGATIVE NEGATIVE Final   Staphylococcus aureus NEGATIVE NEGATIVE Final    Comment:        The Xpert SA Assay (FDA approved for NASAL specimens in patients over 56 years of age), is one component of a comprehensive surveillance program.  Test performance  has been validated by [facility] for patients greater than or equal to 64 year old. It is not intended to diagnose infection nor to guide or monitor treatment.       Radiology Studies: No results found.   Scheduled Meds: . ALIGN  1 capsule Oral Daily  . aspirin  81 mg Oral Daily  . cholecalciferol  2,000 Units Oral Daily  . fluticasone  2 spray Each Nare Daily  . lubiprostone  24 mcg Oral BID WC  . pantoprazole  40 mg Oral Daily  . vitamin B-12  500 mcg Oral Daily   Continuous Infusions: . sodium chloride 100 mL/hr at 01/30/17 1930  . sodium chloride  500 mL (01/31/17 0600)  . cefOXitin     Author:  Berle Mull, MD [facility] Pager: [phone] 01/31/2017 2:49 PM     If 7PM-7AM, please contact night-coverage [email or web address] Password [facility] 01/31/2017, 2:49 PM

## 2017-02-01 LAB — BASIC METABOLIC PANEL
ANION GAP: 5 (ref 5–15)
BUN: 6 mg/dL (ref 6–20)
CALCIUM: 8.5 mg/dL — AB (ref 8.9–10.3)
CO2: 30 mmol/L (ref 22–32)
Chloride: 106 mmol/L (ref 101–111)
Creatinine, Ser: 0.7 mg/dL (ref 0.44–1.00)
GFR calc Af Amer: >60 mL/min (ref >60)
GLUCOSE: 96 mg/dL (ref 65–99)
Potassium: 3.5 mmol/L (ref 3.5–5.1)
Sodium: 141 mmol/L (ref 135–145)

## 2017-02-01 LAB — CBC
HCT: 35.7 % — ABNORMAL LOW (ref 36.0–46.0)
Hemoglobin: 11.3 g/dL — ABNORMAL LOW (ref 12.0–15.0)
MCH: 28.7 pg (ref 26.0–34.0)
MCHC: 31.7 g/dL (ref 30.0–36.0)
MCV: 90.6 fL (ref 78.0–100.0)
PLATELETS: 187 10*3/uL (ref 150–400)
RBC: 3.94 MIL/uL (ref 3.87–5.11)
RDW: 13.7 % (ref 11.5–15.5)
WBC: 6.1 10*3/uL (ref 4.0–10.5)

## 2017-02-01 LAB — MAGNESIUM: Magnesium: 1.8 mg/dL (ref 1.7–2.4)

## 2017-02-01 LAB — GLUCOSE, CAPILLARY: Glucose-Capillary: 87 mg/dL (ref 65–99)

## 2017-02-01 MED ORDER — ENOXAPARIN SODIUM 40 MG/0.4ML ~~LOC~~ SOLN
40.0000 mg | SUBCUTANEOUS | Status: DC
  Administered 2017-02-01 – 2017-02-03 (×3): 40 mg via SUBCUTANEOUS
  Filled 2017-02-01 (×3): qty 0.4

## 2017-02-01 MED ORDER — SCOPOLAMINE 1 MG/3DAYS TD PT72
MEDICATED_PATCH | TRANSDERMAL | Status: AC
  Filled 2017-02-01: qty 1

## 2017-02-01 MED ORDER — CEFOTETAN DISODIUM-DEXTROSE 2-2.08 GM-% IV SOLR
INTRAVENOUS | Status: AC
  Filled 2017-02-01: qty 50

## 2017-02-01 NOTE — Progress Notes (Signed)
[facility]/Trauma Progress Note  2 Days Post-Op   Assessment/Plan LLQ/groin pain S/P ex lap with LOA - 8/15 Dr. Excell Seltzer - intussusception was suspected on initial imaging but repeat CT 8/12 showed no acute abnormalities in the abd/pelvis - LLQ pain resolved  FEN: fulls, advance as tolerated VTE: SCD's, lovenox ID: cefoxitin and cefotan periop Follow up: TBD  DISPO: fulls, advance diet as tolerated.   LOS: 5 days    Subjective:  CC: abdominal pain  Pt states pain is different than before surgery. LLQ pain resolved. Pain is now more soreness. Having flatus. No BM yet.  Mild nausea last night that resolved. No vomiting. Tolerating clears. Lost IV's overnight. IV team will try today to get another one.  Objective: Vital signs in last 24 hours: Temp:  [97.5 F (36.4 C)-98.7 F (37.1 C)] 97.5 F (36.4 C) (08/17 0614) Pulse Rate:  [75-79] 76 (08/17 0614) Resp:  [18-19] 18 (08/17 0614) BP: (128-137)/(55-66) 132/60 (08/17 0614) SpO2:  [99 %-100 %] 99 % (08/17 0614) Last BM Date: 01/27/17  Intake/Output from previous day: 08/16 0701 - 08/17 0700 In: 1950 [P.O.:1620; I.V.:330] Out: 3150 [Urine:3150] Intake/Output this shift: No intake/output data recorded.  PE: Gen:  Alert, NAD, pleasant, cooperative Card:  RRR, no M/G/R heard Pulm:  CTA anteriorly, no W/R/R, effort normal Abd: Soft, not distended, +BS, incisions with glue intact, no signs of infection, mild surrounding ecchymosis, mild generalized TTP, no guarding Skin: no rashes noted, warm and dry   Anti-infectives: Anti-infectives    Start     Dose/Rate Route Frequency Ordered Stop   02/01/17 0649  cefoTEtan in Dextrose 5% (CEFOTAN) 2-2.08 GM-% IVPB  Status:  Discontinued    Comments:  Key, Kristopher   : cabinet override      02/01/17 0649 02/01/17 0835   01/30/17 1730  cefOXitin (MEFOXIN) 2 g in dextrose 5 % 50 mL IVPB     2 g 100 mL/hr over 30 Minutes Intravenous  Once 01/30/17 1725     01/30/17 1439  cefoTEtan in Dextrose 5% (CEFOTAN) 2-2.08 GM-% IVPB  Status:  Discontinued    Comments:  Key, Kristopher   : cabinet override      01/30/17 1439 01/30/17 1517   01/30/17 0600  cefOXitin (MEFOXIN) 2 g in dextrose 5 % 50 mL IVPB     2 g 100 mL/hr over 30 Minutes Intravenous On call to O.R. 01/30/17 0423 01/30/17 1740      Lab Results:   Recent Labs  01/31/17 0609 02/01/17 0512  WBC 11.2* 6.1  HGB 11.5* 11.3*  HCT 35.9* 35.7*  PLT 212 187   BMET  Recent Labs  01/31/17 0609 02/01/17 0512  NA 141 141  K 3.8 3.5  CL 107 106  CO2 29 30  GLUCOSE 102* 96  BUN 8 6  CREATININE 0.74 0.70  CALCIUM 8.3* 8.5*   PT/INR No results for input(s): LABPROT, INR in the last 72 hours. CMP     Component Value Date/Time   NA 141 02/01/2017 0512   K 3.5 02/01/2017 0512   CL 106 02/01/2017 0512   CO2 30 02/01/2017 0512   GLUCOSE 96 02/01/2017 0512   BUN 6 02/01/2017 0512   CREATININE 0.70 02/01/2017 0512   CALCIUM 8.5 (L) 02/01/2017 0512   PROT 5.9 (L) 01/31/2017 0609   ALBUMIN 3.3 (L) 01/31/2017 0609   AST 13 (L) 01/31/2017 0609   ALT 10 (L) 01/31/2017 0609   ALKPHOS 69 01/31/2017 7078  BILITOT 0.5 01/31/2017 0609   GFRNONAA >60 02/01/2017 0512   GFRAA >60 02/01/2017 0512   Lipase  No results found for: LIPASE  Studies/Results: No results found.    Kalman Drape , [facility] 02/01/2017, 9:13 AM Pager: [phone] Consults: [phone] Mon-Fri 7:00 am-4:30 pm Sat-Sun 7:00 am-11:30 am

## 2017-02-01 NOTE — Addendum Note (Signed)
Addendum  created 02/01/17 0835 by Lavina Hamman, CRNA   Order list changed

## 2017-02-01 NOTE — Progress Notes (Signed)
PROGRESS NOTE  [name] JKD:[id number] DOB: [birth year] DOA: 01/26/2017 PCP: Cassandria Anger, MD   LOS: 5 days   Brief Narrative / Interim history: [name] is a 71 y.o. female with medical history significant for but not limited to history of multiple intra-abdominal operations (6), hypertension and diverticulosis with chronic constipation presenting with 1 week history of left lower quadrant abdominal pain progressively worsening over the last 1-2 days associated with mild nausea without vomiting, no fever or chills, no hematochezia or melena.  Initial CT scan showed concern for jejunal jejunal intussusception.  General surgery consulted.  Repeat CT scan showed that that has resolved, however given patient's persistent symptoms surgery will go ahead and do a laparotomy on 8/15  Assessment & Plan: Principal Problem:   Intussusception [facility]) Active Problems:   Essential hypertension   Diverticulosis of colon   Jejuno - jejunal intussusception -confirmed on CT scan initiated on admission, however later CT enterography did not show any intussusception -general surgery consulted, appreciate input SP exploratory laparoscopy with lysis of adhesions on 8:15. Minimal bowel sounds. Upper PIP postoperative recovery.  Patient has sustained some corneal abrasion from dry eyes, getting better, management per surgery. Diet advance to full liquid today. We'll monitor.  Diverticulosis -without evidence of diverticulitis  Hypertension -Hold home anti-hypertensive medications -IV hydralazine when necessary for blood pressure control for now   DVT prophylaxis: SCD Code Status: Full code Family Communication: family at bedside Disposition Plan: home when ready  Consultants:   General surgery  Procedures:   Laparoscopic lysis of adhesion  Antimicrobials:  None    Subjective: Has generalized soreness on the abdomen. No nausea no vomiting. Passing gas. Lost IV access and was  not able to get any pain medication overnight.  Objective: Vitals:   01/31/17 1400 01/31/17 2157 02/01/17 0614 02/01/17 1333  BP: 132/62 (!) 137/55 132/60 (!) 143/55  Pulse: 75 77 76 70  Resp: 18 18 18 18   Temp: 98 F (36.7 C) 98.4 F (36.9 C) (!) 97.5 F (36.4 C) 98.5 F (36.9 C)  TempSrc: Oral Oral Oral Oral  SpO2: 99% 100% 99% 99%  Weight:      Height:        Intake/Output Summary (Last 24 hours) at 02/01/17 1502 Last data filed at 02/01/17 1334  Gross per 24 hour  Intake             2190 ml  Output             3500 ml  Net            -1310 ml   Filed Weights   01/26/17 1800  Weight: 93.7 kg (206 lb 8 oz)    Examination:  Vitals:   01/31/17 1400 01/31/17 2157 02/01/17 0614 02/01/17 1333  BP: 132/62 (!) 137/55 132/60 (!) 143/55  Pulse: 75 77 76 70  Resp: 18 18 18 18   Temp: 98 F (36.7 C) 98.4 F (36.9 C) (!) 97.5 F (36.4 C) 98.5 F (36.9 C)  TempSrc: Oral Oral Oral Oral  SpO2: 99% 100% 99% 99%  Weight:      Height:        General: Appear in moderate distress, no Rash; Oral Mucosa moist. no Abnormal Mass Or lumps Cardiovascular: S1 and S2 Present, no Murmur, Respiratory: normal respiratory effort, Bilateral Air entry present and Clear to Auscultation, n Crackles, no wheezes Abdomen: Bowel Sound present, Soft and diffuse mild tenderness, no hernia Extremities: no Pedal edema, no  calf tenderness Neurology: Alert, Awake and Oriented to Time, Place and Person. affect appropriatee. Grossly no focal neuro deficit.   Data Reviewed: I have independently reviewed following labs and imaging studies   CBC:  Recent Labs Lab 01/26/17 1422 01/26/17 1810 01/27/17 0548 01/30/17 0826 01/31/17 0609 02/01/17 0512  WBC 7.0 8.0 5.8 6.0 11.2* 6.1  NEUTROABS 4.0 4.9  --  3.9 9.0*  --   HGB 12.0 11.6* 11.1* 12.8 11.5* 11.3*  HCT 36.7 35.6* 35.0* 39.7 35.9* 35.7*  MCV 91.1 90.4 91.9 91.1 91.1 90.6  PLT 216 211 198 220 212 846   Basic Metabolic Panel:  Recent  Labs Lab 01/26/17 1422 01/26/17 1810 01/30/17 0826 01/31/17 0609 02/01/17 0512  NA 142 142 141 141 141  K 3.7 3.7 3.9 3.8 3.5  CL 108 108 106 107 106  CO2 29 26 28 29 30   GLUCOSE 90 89 100* 102* 96  BUN 8 7 6 8 6   CREATININE 0.77 0.68 0.73 0.74 0.70  CALCIUM 8.5* 8.6* 9.0 8.3* 8.5*  MG  --   --  1.9 1.9 1.8   GFR: Estimated Creatinine Clearance: 66.7 mL/min (by C-G formula based on SCr of 0.7 mg/dL). Liver Function Tests:  Recent Labs Lab 01/26/17 1422 01/26/17 1810 01/30/17 0826 01/31/17 0609  AST 12* 12* 13* 13*  ALT 11* 11* 11* 10*  ALKPHOS 83 76 84 69  BILITOT 0.6 0.7 0.7 0.5  PROT 6.7 6.2* 7.0 5.9*  ALBUMIN 3.8 3.5 3.9 3.3*   No results for input(s): LIPASE, AMYLASE in the last 168 hours. No results for input(s): AMMONIA in the last 168 hours. Coagulation Profile:  Recent Labs Lab 01/26/17 1810  INR 0.99   Cardiac Enzymes: No results for input(s): CKTOTAL, CKMB, CKMBINDEX, TROPONINI in the last 168 hours. BNP (last 3 results) No results for input(s): PROBNP in the last 8760 hours. HbA1C: No results for input(s): HGBA1C in the last 72 hours. CBG:  Recent Labs Lab 01/27/17 0825 01/29/17 0819 01/30/17 0746 01/31/17 0748 02/01/17 0715  GLUCAP 89 151* 110* 84 87   Lipid Profile: No results for input(s): CHOL, HDL, LDLCALC, TRIG, CHOLHDL, LDLDIRECT in the last 72 hours. Thyroid Function Tests: No results for input(s): TSH, T4TOTAL, FREET4, T3FREE, THYROIDAB in the last 72 hours. Anemia Panel: No results for input(s): VITAMINB12, FOLATE, FERRITIN, TIBC, IRON, RETICCTPCT in the last 72 hours. Urine analysis:    Component Value Date/Time   COLORURINE STRAW (A) 01/26/2017 1351   APPEARANCEUR CLEAR 01/26/2017 1351   LABSPEC 1.038 (H) 01/26/2017 1351   PHURINE 6.0 01/26/2017 1351   GLUCOSEU NEGATIVE 01/26/2017 1351   GLUCOSEU NEGATIVE 01/16/2017 1011   HGBUR NEGATIVE 01/26/2017 1351   BILIRUBINUR NEGATIVE 01/26/2017 1351   BILIRUBINUR neg  01/26/2017 1031   KETONESUR NEGATIVE 01/26/2017 1351   PROTEINUR NEGATIVE 01/26/2017 1351   UROBILINOGEN 0.2 01/26/2017 1031   UROBILINOGEN 0.2 01/16/2017 1011   NITRITE NEGATIVE 01/26/2017 1351   LEUKOCYTESUR SMALL (A) 01/26/2017 1351   Sepsis Labs: Invalid input(s): PROCALCITONIN, LACTICIDVEN  Recent Results (from the past 240 hour(s))  Surgical pcr screen     Status: None   Collection Time: 01/30/17  8:55 AM  Result Value Ref Range Status   MRSA, PCR NEGATIVE NEGATIVE Final   Staphylococcus aureus NEGATIVE NEGATIVE Final    Comment:        The Xpert SA Assay (FDA approved for NASAL specimens in patients over 71 years of age), is one component of a comprehensive surveillance program.  Test performance has been validated by [facility] for patients greater than or equal to 51 year old. It is not intended to diagnose infection nor to guide or monitor treatment.       Radiology Studies: No results found.   Scheduled Meds: . ALIGN  1 capsule Oral Daily  . aspirin  81 mg Oral Daily  . cholecalciferol  2,000 Units Oral Daily  . enoxaparin (LOVENOX) injection  40 mg Subcutaneous Q24H  . fluticasone  2 spray Each Nare Daily  . lubiprostone  24 mcg Oral BID WC  . vitamin B-12  500 mcg Oral Daily   Continuous Infusions: . cefOXitin     Author:  Berle Mull, MD [facility] Pager: [phone] 02/01/2017 3:02 PM     If 7PM-7AM, please contact night-coverage [email or web address] Password [email or web address] 02/01/2017, 3:02 PM

## 2017-02-01 NOTE — Progress Notes (Signed)
Patient tolerated full liquids. No c/o of n/v. Will advanced diet to soft per MD order.

## 2017-02-02 LAB — BASIC METABOLIC PANEL
Anion gap: 6 (ref 5–15)
BUN: 9 mg/dL (ref 6–20)
CHLORIDE: 106 mmol/L (ref 101–111)
CO2: 30 mmol/L (ref 22–32)
CREATININE: 0.77 mg/dL (ref 0.44–1.00)
Calcium: 8.7 mg/dL — ABNORMAL LOW (ref 8.9–10.3)
GFR calc Af Amer: >60 mL/min (ref >60)
GFR calc non Af Amer: >60 mL/min (ref >60)
GLUCOSE: 100 mg/dL — AB (ref 65–99)
Potassium: 3.4 mmol/L — ABNORMAL LOW (ref 3.5–5.1)
Sodium: 142 mmol/L (ref 135–145)

## 2017-02-02 LAB — CBC
HCT: 35.4 % — ABNORMAL LOW (ref 36.0–46.0)
HEMOGLOBIN: 11.3 g/dL — AB (ref 12.0–15.0)
MCH: 29.4 pg (ref 26.0–34.0)
MCHC: 31.9 g/dL (ref 30.0–36.0)
MCV: 91.9 fL (ref 78.0–100.0)
Platelets: 204 10*3/uL (ref 150–400)
RBC: 3.85 MIL/uL — ABNORMAL LOW (ref 3.87–5.11)
RDW: 14.1 % (ref 11.5–15.5)
WBC: 6.2 10*3/uL (ref 4.0–10.5)

## 2017-02-02 LAB — GLUCOSE, CAPILLARY: GLUCOSE-CAPILLARY: 78 mg/dL (ref 65–99)

## 2017-02-02 MED ORDER — BISACODYL 10 MG RE SUPP
10.0000 mg | Freq: Every day | RECTAL | Status: DC | PRN
  Administered 2017-02-02: 10 mg via RECTAL
  Filled 2017-02-02: qty 1

## 2017-02-02 MED ORDER — ALUM & MAG HYDROXIDE-SIMETH 200-200-20 MG/5ML PO SUSP
15.0000 mL | ORAL | Status: DC | PRN
  Administered 2017-02-02: 15 mL via ORAL
  Filled 2017-02-02: qty 30

## 2017-02-02 MED ORDER — PANTOPRAZOLE SODIUM 40 MG PO TBEC
40.0000 mg | DELAYED_RELEASE_TABLET | Freq: Every day | ORAL | Status: DC
  Administered 2017-02-02 – 2017-02-03 (×2): 40 mg via ORAL
  Filled 2017-02-02 (×2): qty 1

## 2017-02-02 NOTE — Progress Notes (Signed)
PROGRESS NOTE  [name] IFO:[id number] DOB: Aug 04, 1945 DOA: 01/26/2017 PCP: Cassandria Anger, MD   LOS: 6 days   Brief Narrative / Interim history: [name] is a 71 y.o. female with medical history significant for but not limited to history of multiple intra-abdominal operations (6), hypertension and diverticulosis with chronic constipation presenting with 1 week history of left lower quadrant abdominal pain progressively worsening over the last 1-2 days associated with mild nausea without vomiting, no fever or chills, no hematochezia or melena.  Initial CT scan showed concern for jejunal jejunal intussusception.  General surgery consulted.  Repeat CT scan showed that that has resolved, however given patient's persistent symptoms surgery will go ahead and do a laparotomy on 8/15.  Assessment & Plan: Principal Problem:   Intussusception ([place]) Active Problems:   Essential hypertension   Diverticulosis of colon   Jejuno - jejunal intussusception -confirmed on CT scan initiated on admission, however later CT enterography did not show any intussusception -general surgery consulted, appreciate input SP exploratory laparoscopy with lysis of adhesions on 8:15. Minimal bowel sounds. Upper PIP postoperative recovery.  Patient has sustained some corneal abrasion from dry eyes, getting better, management per surgery. Diet advance to full liquid. We'll monitor. Dulcolax suppository.   Diverticulosis -without evidence of diverticulitis  Hypertension -Hold home anti-hypertensive medications -IV hydralazine when necessary for blood pressure control for now   DVT prophylaxis: SCD Code Status: Full code Family Communication: family at bedside Disposition Plan: home when ready  Consultants:   General surgery  Procedures:   Laparoscopic lysis of adhesion  Antimicrobials:  None    Subjective: Feeling better, continues to pass gas, no BM. No nausea no vomiting. Did have  some abdominal pain after eating this morning. Vision back to normal.  Objective: Vitals:   02/01/17 1333 02/01/17 2042 02/02/17 0507 02/02/17 1417  BP: (!) 143/55 (!) 150/74 (!) 146/65 (!) 154/65  Pulse: 70 77 70 79  Resp: 18 18 16 16   Temp: 98.5 F (36.9 C) 98.4 F (36.9 C) 97.8 F (36.6 C) 98.2 F (36.8 C)  TempSrc: Oral Oral Oral Oral  SpO2: 99% 96% 96% 95%  Weight:      Height:        Intake/Output Summary (Last 24 hours) at 02/02/17 1559 Last data filed at 02/02/17 1417  Gross per 24 hour  Intake              720 ml  Output             2100 ml  Net            -1380 ml   Filed Weights   01/26/17 1800  Weight: 93.7 kg (206 lb 8 oz)    Examination:  Vitals:   02/01/17 1333 02/01/17 2042 02/02/17 0507 02/02/17 1417  BP: (!) 143/55 (!) 150/74 (!) 146/65 (!) 154/65  Pulse: 70 77 70 79  Resp: 18 18 16 16   Temp: 98.5 F (36.9 C) 98.4 F (36.9 C) 97.8 F (36.6 C) 98.2 F (36.8 C)  TempSrc: Oral Oral Oral Oral  SpO2: 99% 96% 96% 95%  Weight:      Height:        General: Appear in no distress, no Rash; Oral Mucosa moist. no Abnormal Mass Or lumps Cardiovascular: S1 and S2 Present, no Murmur, Respiratory: normal respiratory effort, Bilateral Air entry present and Clear to Auscultation, n Crackles, no wheezes Abdomen: Bowel Sound present, Soft and diffuse mild tenderness, no hernia  Extremities: no Pedal edema, no calf tenderness Neurology: Alert, Awake and Oriented to Time, Place and Person. affect appropriatee. Grossly no focal neuro deficit.   Data Reviewed: I have independently reviewed following labs and imaging studies   CBC:  Recent Labs Lab 01/26/17 1810 01/27/17 0548 01/30/17 0826 01/31/17 0609 02/01/17 0512 02/02/17 0535  WBC 8.0 5.8 6.0 11.2* 6.1 6.2  NEUTROABS 4.9  --  3.9 9.0*  --   --   HGB 11.6* 11.1* 12.8 11.5* 11.3* 11.3*  HCT 35.6* 35.0* 39.7 35.9* 35.7* 35.4*  MCV 90.4 91.9 91.1 91.1 90.6 91.9  PLT 211 198 220 212 187 478   Basic  Metabolic Panel:  Recent Labs Lab 01/26/17 1810 01/30/17 0826 01/31/17 0609 02/01/17 0512 02/02/17 0535  NA 142 141 141 141 142  K 3.7 3.9 3.8 3.5 3.4*  CL 108 106 107 106 106  CO2 26 28 29 30 30   GLUCOSE 89 100* 102* 96 100*  BUN 7 6 8 6 9   CREATININE 0.68 0.73 0.74 0.70 0.77  CALCIUM 8.6* 9.0 8.3* 8.5* 8.7*  MG  --  1.9 1.9 1.8  --    GFR: Estimated Creatinine Clearance: 66.7 mL/min (by C-G formula based on SCr of 0.77 mg/dL). Liver Function Tests:  Recent Labs Lab 01/26/17 1810 01/30/17 0826 01/31/17 0609  AST 12* 13* 13*  ALT 11* 11* 10*  ALKPHOS 76 84 69  BILITOT 0.7 0.7 0.5  PROT 6.2* 7.0 5.9*  ALBUMIN 3.5 3.9 3.3*   No results for input(s): LIPASE, AMYLASE in the last 168 hours. No results for input(s): AMMONIA in the last 168 hours. Coagulation Profile:  Recent Labs Lab 01/26/17 1810  INR 0.99   Cardiac Enzymes: No results for input(s): CKTOTAL, CKMB, CKMBINDEX, TROPONINI in the last 168 hours. BNP (last 3 results) No results for input(s): PROBNP in the last 8760 hours. HbA1C: No results for input(s): HGBA1C in the last 72 hours. CBG:  Recent Labs Lab 01/29/17 0819 01/30/17 0746 01/31/17 0748 02/01/17 0715 02/02/17 0753  GLUCAP 151* 110* 84 87 78   Lipid Profile: No results for input(s): CHOL, HDL, LDLCALC, TRIG, CHOLHDL, LDLDIRECT in the last 72 hours. Thyroid Function Tests: No results for input(s): TSH, T4TOTAL, FREET4, T3FREE, THYROIDAB in the last 72 hours. Anemia Panel: No results for input(s): VITAMINB12, FOLATE, FERRITIN, TIBC, IRON, RETICCTPCT in the last 72 hours. Urine analysis:    Component Value Date/Time   COLORURINE STRAW (A) 01/26/2017 1351   APPEARANCEUR CLEAR 01/26/2017 1351   LABSPEC 1.038 (H) 01/26/2017 1351   PHURINE 6.0 01/26/2017 1351   GLUCOSEU NEGATIVE 01/26/2017 1351   GLUCOSEU NEGATIVE 01/16/2017 1011   HGBUR NEGATIVE 01/26/2017 1351   BILIRUBINUR NEGATIVE 01/26/2017 1351   BILIRUBINUR neg 01/26/2017 1031    KETONESUR NEGATIVE 01/26/2017 1351   PROTEINUR NEGATIVE 01/26/2017 1351   UROBILINOGEN 0.2 01/26/2017 1031   UROBILINOGEN 0.2 01/16/2017 1011   NITRITE NEGATIVE 01/26/2017 1351   LEUKOCYTESUR SMALL (A) 01/26/2017 1351   Sepsis Labs: Invalid input(s): PROCALCITONIN, LACTICIDVEN  Recent Results (from the past 240 hour(s))  Surgical pcr screen     Status: None   Collection Time: 01/30/17  8:55 AM  Result Value Ref Range Status   MRSA, PCR NEGATIVE NEGATIVE Final   Staphylococcus aureus NEGATIVE NEGATIVE Final    Comment:        The Xpert SA Assay (FDA approved for NASAL specimens in patients over 5 years of age), is one component of a comprehensive surveillance program.  Test performance has been validated by [facility] for patients greater than or equal to 5 year old. It is not intended to diagnose infection nor to guide or monitor treatment.       Radiology Studies: No results found.   Scheduled Meds: . ALIGN  1 capsule Oral Daily  . aspirin  81 mg Oral Daily  . cholecalciferol  2,000 Units Oral Daily  . enoxaparin (LOVENOX) injection  40 mg Subcutaneous Q24H  . fluticasone  2 spray Each Nare Daily  . lubiprostone  24 mcg Oral BID WC  . pantoprazole  40 mg Oral Daily  . vitamin B-12  500 mcg Oral Daily   Continuous Infusions: . cefOXitin     Author:  Berle Mull, MD [facility] Pager: [phone] 02/02/2017 3:59 PM     If 7PM-7AM, please contact night-coverage [email or web address] Password [facility] 02/02/2017, 3:59 PM

## 2017-02-02 NOTE — Progress Notes (Signed)
[facility]/Trauma Progress Note  3 Days Post-Op   Assessment/Plan LLQ/groin pain S/P ex lap with LOA - 8/15 Dr. Excell Seltzer - intussusception was suspected on initial imaging but repeat CT 8/12 showed no acute abnormalities in the abd/pelvis - LLQ pain resolved  FEN: regular diet.  No BM yet.  Add suppository if no BM by this afternoon.   VTE: SCD's, lovenox ID: cefoxitin and cefotan periop Follow up: TBD  DISPO: await BM.  Tx mild nausea.     LOS: 6 days    Subjective:  CC: abdominal pain  Pt is tolerable.  Pt passing gas.  Had some mild nausea with real food this AM and last night.  Backed off a bit.     Objective: Vital signs in last 24 hours: Temp:  [97.8 F (36.6 C)-98.5 F (36.9 C)] 97.8 F (36.6 C) (08/18 0507) Pulse Rate:  [70-77] 70 (08/18 0507) Resp:  [16-18] 16 (08/18 0507) BP: (143-150)/(55-74) 146/65 (08/18 0507) SpO2:  [96 %-99 %] 96 % (08/18 0507) Last BM Date: 01/27/17  Intake/Output from previous day: 08/17 0701 - 08/18 0700 In: 600 [P.O.:600] Out: 2900 [Urine:2900] Intake/Output this shift: No intake/output data recorded.  PE: Gen:  Alert, NAD, pleasant, cooperative Pulm:  Breathing comfortably Abd: Soft, not distended, incisions with glue intact, no signs of infection, no guarding Skin: no rashes noted, warm and dry   Anti-infectives: Anti-infectives    Start     Dose/Rate Route Frequency Ordered Stop   02/01/17 0649  cefoTEtan in Dextrose 5% (CEFOTAN) 2-2.08 GM-% IVPB  Status:  Discontinued    Comments:  Key, Kristopher   : cabinet override      02/01/17 0649 02/01/17 0835   01/30/17 1730  cefOXitin (MEFOXIN) 2 g in dextrose 5 % 50 mL IVPB     2 g 100 mL/hr over 30 Minutes Intravenous  Once 01/30/17 1725     01/30/17 1439  cefoTEtan in Dextrose 5% (CEFOTAN) 2-2.08 GM-% IVPB  Status:  Discontinued    Comments:  Key, Kristopher   : cabinet override      01/30/17 1439 01/30/17 1517   01/30/17 0600  cefOXitin (MEFOXIN) 2 g  in dextrose 5 % 50 mL IVPB     2 g 100 mL/hr over 30 Minutes Intravenous On call to O.R. 01/30/17 0423 01/30/17 1740      Lab Results:   Recent Labs  02/01/17 0512 02/02/17 0535  WBC 6.1 6.2  HGB 11.3* 11.3*  HCT 35.7* 35.4*  PLT 187 204   BMET  Recent Labs  02/01/17 0512 02/02/17 0535  NA 141 142  K 3.5 3.4*  CL 106 106  CO2 30 30  GLUCOSE 96 100*  BUN 6 9  CREATININE 0.70 0.77  CALCIUM 8.5* 8.7*   PT/INR No results for input(s): LABPROT, INR in the last 72 hours. CMP     Component Value Date/Time   NA 142 02/02/2017 0535   K 3.4 (L) 02/02/2017 0535   CL 106 02/02/2017 0535   CO2 30 02/02/2017 0535   GLUCOSE 100 (H) 02/02/2017 0535   BUN 9 02/02/2017 0535   CREATININE 0.77 02/02/2017 0535   CALCIUM 8.7 (L) 02/02/2017 0535   PROT 5.9 (L) 01/31/2017 0609   ALBUMIN 3.3 (L) 01/31/2017 0609   AST 13 (L) 01/31/2017 0609   ALT 10 (L) 01/31/2017 0609   ALKPHOS 69 01/31/2017 0609   BILITOT 0.5 01/31/2017 0609   GFRNONAA >60 02/02/2017 0535   GFRAA >60 02/02/2017  0535   Lipase  No results found for: LIPASE  Studies/Results: No results found.    Stark Klein , MD

## 2017-02-03 LAB — CBC
HCT: 35.4 % — ABNORMAL LOW (ref 36.0–46.0)
Hemoglobin: 11.3 g/dL — ABNORMAL LOW (ref 12.0–15.0)
MCH: 29.1 pg (ref 26.0–34.0)
MCHC: 31.9 g/dL (ref 30.0–36.0)
MCV: 91.2 fL (ref 78.0–100.0)
PLATELETS: 211 10*3/uL (ref 150–400)
RBC: 3.88 MIL/uL (ref 3.87–5.11)
RDW: 14 % (ref 11.5–15.5)
WBC: 5.9 10*3/uL (ref 4.0–10.5)

## 2017-02-03 LAB — BASIC METABOLIC PANEL
Anion gap: 5 (ref 5–15)
BUN: 7 mg/dL (ref 6–20)
CALCIUM: 8.8 mg/dL — AB (ref 8.9–10.3)
CO2: 32 mmol/L (ref 22–32)
CREATININE: 0.71 mg/dL (ref 0.44–1.00)
Chloride: 107 mmol/L (ref 101–111)
Glucose, Bld: 93 mg/dL (ref 65–99)
Potassium: 3.8 mmol/L (ref 3.5–5.1)
SODIUM: 144 mmol/L (ref 135–145)

## 2017-02-03 LAB — GLUCOSE, CAPILLARY: GLUCOSE-CAPILLARY: 92 mg/dL (ref 65–99)

## 2017-02-03 MED ORDER — POLYETHYLENE GLYCOL 3350 17 G PO PACK: 17.0000 g | PACK | Freq: Every day | ORAL | 0 refills | Status: DC

## 2017-02-03 MED ORDER — OXYCODONE HCL 5 MG PO TABS: 5.0000 mg | ORAL_TABLET | ORAL | 0 refills | Status: DC | PRN

## 2017-02-03 NOTE — Progress Notes (Signed)
Assessment unchanged. Pt verbalized understanding of dc instructions through teach back including when to call MD and follow up care. Pt stated "i need to call Dr. Excell Seltzer in next day or so." One script at dc. Discharged via wc accompanied by nurse, NT and daughter to front entrance.

## 2017-02-03 NOTE — Progress Notes (Signed)
[facility]/Trauma Progress Note  4 Days Post-Op   Assessment/Plan LLQ/groin pain S/P ex lap with LOA - 8/15 Dr. Excell Seltzer - intussusception was suspected on initial imaging but repeat CT 8/12 showed no acute abnormalities in the abd/pelvis - LLQ pain resolved OK for d/c from surgical standpoint.    FEN: regular diet.     VTE: SCD's, lovenox ID: cefoxitin and cefotan periop Follow up: TBD  DISPO: per medicine.  [place] for d/c from surgical standpoint.      LOS: 7 days    Subjective:  CC: Doing much better.  Tolerated diet, no n/v.  Had stools and still passing gas.     Objective: Vital signs in last 24 hours: Temp:  [98.2 F (36.8 C)-98.7 F (37.1 C)] 98.2 F (36.8 C) (08/19 0533) Pulse Rate:  [71-79] 72 (08/19 0533) Resp:  [16-18] 18 (08/19 0533) BP: (140-154)/(57-65) 145/62 (08/19 0533) SpO2:  [95 %-96 %] 96 % (08/19 0533) Last BM Date: 02/02/17  Intake/Output from previous day: 08/18 0701 - 08/19 0700 In: 840 [P.O.:840] Out: 802 [Urine:800; Stool:2] Intake/Output this shift: No intake/output data recorded.  PE: Gen:  Alert, NAD, pleasant, cooperative Pulm:  Breathing comfortably Abd: Soft, not distended, incisions with glue intact, no signs of infection, no guarding Skin: no rashes noted, warm and dry   Anti-infectives: Anti-infectives    Start     Dose/Rate Route Frequency Ordered Stop   02/01/17 0649  cefoTEtan in Dextrose 5% (CEFOTAN) 2-2.08 GM-% IVPB  Status:  Discontinued    Comments:  Key, Kristopher   : cabinet override      02/01/17 0649 02/01/17 0835   01/30/17 1730  cefOXitin (MEFOXIN) 2 g in dextrose 5 % 50 mL IVPB     2 g 100 mL/hr over 30 Minutes Intravenous  Once 01/30/17 1725     01/30/17 1439  cefoTEtan in Dextrose 5% (CEFOTAN) 2-2.08 GM-% IVPB  Status:  Discontinued    Comments:  Key, Kristopher   : cabinet override      01/30/17 1439 01/30/17 1517   01/30/17 0600  cefOXitin (MEFOXIN) 2 g in dextrose 5 % 50 mL IVPB     2  g 100 mL/hr over 30 Minutes Intravenous On call to O.R. 01/30/17 0423 01/30/17 1740      Lab Results:   Recent Labs  02/02/17 0535 02/03/17 0516  WBC 6.2 5.9  HGB 11.3* 11.3*  HCT 35.4* 35.4*  PLT 204 211   BMET  Recent Labs  02/02/17 0535 02/03/17 0516  NA 142 144  K 3.4* 3.8  CL 106 107  CO2 30 32  GLUCOSE 100* 93  BUN 9 7  CREATININE 0.77 0.71  CALCIUM 8.7* 8.8*   PT/INR No results for input(s): LABPROT, INR in the last 72 hours. CMP     Component Value Date/Time   NA 144 02/03/2017 0516   K 3.8 02/03/2017 0516   CL 107 02/03/2017 0516   CO2 32 02/03/2017 0516   GLUCOSE 93 02/03/2017 0516   BUN 7 02/03/2017 0516   CREATININE 0.71 02/03/2017 0516   CALCIUM 8.8 (L) 02/03/2017 0516   PROT 5.9 (L) 01/31/2017 0609   ALBUMIN 3.3 (L) 01/31/2017 0609   AST 13 (L) 01/31/2017 0609   ALT 10 (L) 01/31/2017 0609   ALKPHOS 69 01/31/2017 0609   BILITOT 0.5 01/31/2017 0609   GFRNONAA >60 02/03/2017 0516   GFRAA >60 02/03/2017 0516   Lipase  No results found for: LIPASE  Studies/Results: No  results found.    Stark Klein , MD

## 2017-02-06 NOTE — Discharge Summary (Signed)
[facility] Discharge Summary   Patient: [name] DTO:[id number]   PCP: Cassandria Anger, MD DOB: 08-Apr-1946   Date of admission: 01/26/2017   Date of discharge: 02/03/2017     Discharge Diagnoses:  Principal Problem:   Intussusception [facility]) Active Problems:   Essential hypertension   Diverticulosis of colon   Admitted From: home Disposition:  home  Recommendations for Outpatient Follow-up:  1. pleaase follow up with surgery as recommended   Follow-up Information    Plotnikov, Karen Lacks, MD Follow up in 2 week(s).   Specialty:  Internal Medicine Contact information: [place] [address] [phone]        Excell Seltzer, MD. Call in 1 day(s).   Specialty:  General Surgery Contact information: [address] [place] [address] [phone]          Diet recommendation: soft diet  Activity: The patient is advised to gradually reintroduce usual activities.  Discharge Condition: good  Code Status: full code  History of present illness: As per the H and P dictated on admission, " [name] is a 71 y.o. female with medical history significant for but not limited to history of multiple intra-abdominal operations (6), hypertension and diverticulosis with chronic constipation presenting with 1 week history of left lower quadrant abdominal pain progressively worsening over the last 1-2 days associated with mild nausea without vomiting, no fever or chills, no hematochezia or melena"  Hospital Course:  Summary of her active problems in the hospital is as following. Jejuno - jejunal intussusception -confirmed on CT scan initiated on admission, however later CT enterography did not show any intussusception -general surgery consulted, appreciate input SP exploratory laparoscopy with lysis of adhesions on 8:15. Minimal bowel sounds. Upper PIP postoperative recovery.  Patient has sustained some corneal abrasion from dry eyes,  resolved management per surgery. tolerating diet and ambulating, had a BM as well.  Diverticulosis -without evidence of diverticulitis  Hypertension Resume home meds.  All other chronic medical condition were stable during the hospitalization.  Patient was ambulatory without any assistance.  On the day of the discharge the patient's vitals were stable, and no other acute medical condition were reported by patient. the patient was felt safe to be discharge at home with family.  Procedures and Results:   Laparoscopic lysis of adhesion  Consultations:  General surgery   DISCHARGE MEDICATION: Discharge Medication List as of 02/03/2017 11:27 AM    START taking these medications   Details  polyethylene glycol (MIRALAX) packet Take 17 g by mouth daily., Starting Sun 02/03/2017, Normal      CONTINUE these medications which have CHANGED   Details  oxyCODONE (OXY IR/ROXICODONE) 5 MG immediate release tablet Take 1 tablet (5 mg total) by mouth every 4 (four) hours as needed for moderate pain or severe pain., Starting Sun 02/03/2017, Print      CONTINUE these medications which have NOT CHANGED   Details  acetaminophen (TYLENOL) 500 MG tablet Take 1,000 mg by mouth every 6 (six) hours as needed., Historical Med    ALPRAZolam (XANAX) 0.5 MG tablet Take 1 tablet (0.5 mg total) by mouth 2 (two) times daily as needed., Starting Wed 01/16/2017, Print    aspirin 81 MG tablet Take 81 mg by mouth daily.  , Historical Med    buPROPion (WELLBUTRIN SR) 150 MG 12 hr tablet Take 1 tablet by mouth  twice a day, Normal    butalbital-acetaminophen-caffeine (FIORICET WITH CODEINE) 50-325-40-30 MG  per capsule Take 1 capsule by mouth every 6 (six) hours as needed for headache., Starting Fri 12/24/2014, Print    Cholecalciferol (VITAMIN D) 2000 UNITS CAPS Take 1 capsule by mouth daily.  , Historical Med    diclofenac sodium (VOLTAREN) 1 % GEL Apply to large joints up to TID as directed, Print    fish  oil-omega-3 fatty acids 1000 MG capsule Take by mouth daily. Take  600 mg daily, Historical Med    fluticasone (FLONASE) 50 MCG/ACT nasal spray Use 2 sprays in each  nostril daily, Normal    gabapentin (NEURONTIN) 100 MG capsule Take 1 capsule (100 mg total) by mouth at bedtime. Annual appt due in July must see MD for refills, Starting Fri 12/14/2016, Normal    ibuprofen (ADVIL,MOTRIN) 200 MG tablet Take 200 mg by mouth every 6 (six) hours as needed., Historical Med    lovastatin (MEVACOR) 40 MG tablet Take 1 tablet (40 mg total) by mouth at bedtime., Starting Tue 01/29/2017, Normal    lubiprostone (AMITIZA) 24 MCG capsule Take 1 capsule (24 mcg total) by mouth 2 (two) times daily with a meal., Starting Fri 10/14/2015, Normal    Multiple Vitamins-Minerals (OCUVITE PO) Take 2 tablets by mouth daily., Historical Med    omeprazole (PRILOSEC) 40 MG capsule TAKE 1 CAPSULE BY MOUTH  TWICE DAILY BEFORE MEALS, Normal    Probiotic Product (ALIGN) 4 MG CAPS Take 1 capsule (4 mg total) by mouth daily., Starting Sun 06/21/2013, Print    traZODone (DESYREL) 50 MG tablet Take 1 tablet (50 mg total) by mouth at bedtime., Starting Thu 11/22/2016, Until Tue 05/21/2017, Normal    verapamil (CALAN-SR) 240 MG CR tablet TAKE 1 TABLET BY MOUTH AT  BEDTIME. YEARLY PHYSICAL  W/LABS DUE IN JULY MUST SEE MD FOR REFILLS, Normal    VESICARE 10 MG tablet Take 10 mg by mouth daily. , Starting Tue 12/07/2014, Historical Med    vitamin B-12 (CYANOCOBALAMIN) 1000 MCG tablet Take 500 mcg by mouth daily. , Historical Med      STOP taking these medications     HYDROcodone-acetaminophen (NORCO/VICODIN) 5-325 MG tablet        Allergies  Allergen Reactions  . Levaquin [Levofloxacin]     nausea   Discharge Instructions    Diet - low sodium heart healthy    Complete by:  As directed    Discharge instructions    Complete by:  As directed    It is important that you read following instructions as well as go over your  medication list with RN to help you understand your care after this hospitalization.  Discharge Instructions: Please follow-up with PCP in one week  Please request your primary care physician to go over all Hospital Tests and Procedure/Radiological results at the follow up,  Please get all Hospital records sent to your PCP by signing hospital release before you go home.   Do not drive, operating heavy machinery, perform activities at heights, swimming or participation in water activities or provide baby sitting services while you are on Pain, Sleep and Anxiety Medications; until you have been seen by Primary Care Physician or a Neurologist and advised to do so again. Do not take more than prescribed Pain, Sleep and Anxiety Medications. You were cared for by a hospitalist during your hospital stay. If you have any questions about your discharge medications or the care you received while you were in the hospital after you are discharged, you can call the unit and  ask to speak with the hospitalist on call if the hospitalist that took care of you is not available.  Once you are discharged, your primary care physician will handle any further medical issues. Please note that NO REFILLS for any discharge medications will be authorized once you are discharged, as it is imperative that you return to your primary care physician (or establish a relationship with a primary care physician if you do not have one) for your aftercare needs so that they can reassess your need for medications and monitor your lab values. You Must read complete instructions/literature along with all the possible adverse reactions/side effects for all the Medicines you take and that have been prescribed to you. Take any new Medicines after you have completely understood and accept all the possible adverse reactions/side effects. Wear Seat belts while driving. If you have smoked or chewed Tobacco in the last 2 yrs please stop smoking  and/or stop any Recreational drug use.   Increase activity slowly    Complete by:  As directed      Discharge Exam: Filed Weights   01/26/17 1800  Weight: 93.7 kg (206 lb 8 oz)   Vitals:   02/02/17 2103 02/03/17 0533  BP: (!) 140/57 (!) 145/62  Pulse: 71 72  Resp: 18 18  Temp: 98.7 F (37.1 C) 98.2 F (36.8 C)  SpO2: 95% 96%   General: Appear in no distress, no Rash; Oral Mucosa oist. Cardiovascular: S1 and S2 Present, no Murmur, no JVD Respiratory: Bilateral Air entry present and Clear to Auscultation, no Crackles, no wheezes Abdomen: Bowel Sound present, Soft and mild tenderness Extremities: no Pedal edema, no calf tenderness Neurology: Grossly no focal neuro deficit.  The results of significant diagnostics from this hospitalization (including imaging, microbiology, ancillary and laboratory) are listed below for reference.    Significant Diagnostic Studies: Ct Abdomen Pelvis W Contrast  Result Date: 01/26/2017 CLINICAL DATA:  LEFT lower quadrant abdominal pain for 1 week, suspected diverticulitis, history colonic diverticulosis, hypertension, GERD, fibromyalgia EXAM: CT ABDOMEN AND PELVIS WITH CONTRAST TECHNIQUE: Multidetector CT imaging of the abdomen and pelvis was performed using the standard protocol following bolus administration of intravenous contrast. Sagittal and coronal MPR images reconstructed from axial data set. CONTRAST:  147mL ISOVUE-300 IOPAMIDOL (ISOVUE-300) INJECTION 61% IV. Dilute oral contrast. COMPARISON:  None available; prior CT from 02/12/2000 predates PACs and is not available for comparison FINDINGS: Lower chest: Lung bases clear Hepatobiliary: Gallbladder surgically absent. Scattered hepatic cysts. No biliary dilatation. Pancreas: Normal appearance Spleen: Normal appearance Adrenals/Urinary Tract: Adrenal glands, kidneys, ureters, and bladder normal appearance Stomach/Bowel: Prior gastric stapling. Segment of jejuno-jejunal intussusception in the upper  LEFT pelvis, approximate 3 cm length, without bowel obstruction. Long redundant sigmoid loop. Bowel loops otherwise unremarkable. No evidence of diverticulitis or definite visualization of colonic diverticula. Appendix surgically absent by history. Vascular/Lymphatic: Atherosclerotic calcifications aorta and iliac arteries without aneurysm. No adenopathy. Reproductive: Uterus surgically absent with nonvisualization of ovaries Other: No free air or free fluid. No inflammatory process or hernia. Musculoskeletal: Osseous demineralization.  No acute bony findings. IMPRESSION: Segment of jejuno-jejunal intussusception in the LEFT pelvis without evidence of bowel obstruction ; no lead point is visualized. Hepatic cysts. No other significant intra-abdominal or intrapelvic abnormalities. Aortic Atherosclerosis (ICD10-I70.0). Electronically Signed   By: Lavonia Dana [name]   On: 01/26/2017 12:46   Ct Entero Abd/pelvis W Contast  Result Date: 01/27/2017 CLINICAL DATA:  Left groin pain and back pain. History of prior jejunal intussusception on CT. EXAM:  CT ABDOMEN AND PELVIS WITH CONTRAST (ENTEROGRAPHY) TECHNIQUE: Multidetector CT of the abdomen and pelvis during bolus administration of intravenous contrast. Negative oral contrast was given. CONTRAST:  < 100 cc ISOVUE-300 IOPAMIDOL (ISOVUE-300) INJECTION 61% COMPARISON:  CT scan 01/26/2017 FINDINGS: Lower chest: The lung bases are grossly clear. Patchy areas of subsegmental atelectasis are noted. No pleural effusion. The heart is mildly enlarged but stable. Mild dilatation of the distal esophagus and surgical changes at the GE junction. Hepatobiliary: Stable hepatic cysts. No worrisome hepatic lesions. The gallbladder is surgically absent. No common bile duct dilatation. Pancreas: No mass, inflammation or ductal dilatation. Moderate atrophy. Spleen: Normal size.  No focal lesions. Adrenals/Urinary Tract: The adrenal glands and kidneys are unremarkable. No renal, ureteral or  bladder calculi or mass. Stomach/Bowel: The contrast on the prior CT scan has moved into the colon. No findings for obstruction. Surgical changes involving the stomach. The duodenum, small bowel and colon are grossly normal. No acute inflammatory process, mass lesions or obstructive findings. No intussusception is demonstrated. The terminal ileum appears normal. Vascular/Lymphatic: Stable aortic calcifications without aneurysm or dissection. The branch vessels are patent. The major venous structures are patent. No mesenteric or retroperitoneal mass or lymphadenopathy. Reproductive: Surgically absent.  Mild cystocele. Other: No pelvic mass or adenopathy. No free pelvic fluid collections. No inguinal mass or adenopathy. No abdominal wall hernia or subcutaneous lesions. Musculoskeletal: No significant bony findings. IMPRESSION: No acute abdominal/ pelvic findings, mass lesions or adenopathy. No findings for small bowel obstruction or acute inflammatory bowel process. No intussusception is demonstrated. Electronically Signed   By: Marijo Sanes [name]   On: 01/27/2017 15:33   Dg Abd 2 Views  Result Date: 01/27/2017 CLINICAL DATA:  LEFT lower quadrant abdominal pain EXAM: ABDOMEN - 2 VIEW COMPARISON:  CT abdomen and pelvis 01/26/2017 FINDINGS: Retained contrast in colon. Prior gastric bypass procedure. Nonobstructive bowel gas pattern. No bowel dilatation, bowel wall thickening or evidence of obstruction. Osseous structures unremarkable. Few pelvic phleboliths. No urinary tract calcification. IMPRESSION: Nonobstructive bowel gas pattern. Electronically Signed   By: Lavonia Dana [name]   On: 01/27/2017 08:22    Microbiology: Recent Results (from the past 240 hour(s))  Surgical pcr screen     Status: None   Collection Time: 01/30/17  8:55 AM  Result Value Ref Range Status   MRSA, PCR NEGATIVE NEGATIVE Final   Staphylococcus aureus NEGATIVE NEGATIVE Final    Comment:        The Xpert SA Assay (FDA approved for  NASAL specimens in patients over 32 years of age), is one component of a comprehensive surveillance program.  Test performance has been validated by [facility] for patients greater than or equal to 32 year old. It is not intended to diagnose infection nor to guide or monitor treatment.      Labs: CBC:  Recent Labs Lab 01/31/17 0609 02/01/17 0512 02/02/17 0535 02/03/17 0516  WBC 11.2* 6.1 6.2 5.9  NEUTROABS 9.0*  --   --   --   HGB 11.5* 11.3* 11.3* 11.3*  HCT 35.9* 35.7* 35.4* 35.4*  MCV 91.1 90.6 91.9 91.2  PLT 212 187 204 258   Basic Metabolic Panel:  Recent Labs Lab 01/31/17 0609 02/01/17 0512 02/02/17 0535 02/03/17 0516  NA 141 141 142 144  K 3.8 3.5 3.4* 3.8  CL 107 106 106 107  CO2 29 30 30  32  GLUCOSE 102* 96 100* 93  BUN 8 6 9 7   CREATININE 0.74 0.70 0.77 0.71  CALCIUM 8.3* 8.5* 8.7* 8.8*  MG 1.9 1.8  --   --    Liver Function Tests:  Recent Labs Lab 01/31/17 0609  AST 13*  ALT 10*  ALKPHOS 69  BILITOT 0.5  PROT 5.9*  ALBUMIN 3.3*   No results for input(s): LIPASE, AMYLASE in the last 168 hours. No results for input(s): AMMONIA in the last 168 hours. Cardiac Enzymes: No results for input(s): CKTOTAL, CKMB, CKMBINDEX, TROPONINI in the last 168 hours. BNP (last 3 results) No results for input(s): BNP in the last 8760 hours. CBG:  Recent Labs Lab 01/31/17 0748 02/01/17 0715 02/02/17 0753 02/03/17 0724  GLUCAP 84 87 78 92   Time spent: 35 minutes  Signed:  Berle Mull  [facility] 02/03/2017 , 11:10 PM

## 2017-02-07 ENCOUNTER — Emergency Department (HOSPITAL_COMMUNITY): Payer: Medicare Other

## 2017-02-07 ENCOUNTER — Encounter (HOSPITAL_COMMUNITY): Payer: Self-pay

## 2017-02-07 ENCOUNTER — Inpatient Hospital Stay (HOSPITAL_COMMUNITY)
Admission: EM | Admit: 2017-02-07 | Discharge: 2017-02-17 | DRG: 389 | Disposition: A | Discharge status: Home / Self Care | Payer: Medicare Other | Attending: Internal Medicine | Admitting: Internal Medicine

## 2017-02-07 DIAGNOSIS — K9189 Other postprocedural complications and disorders of digestive system: Secondary | ICD-10-CM | POA: Diagnosis not present

## 2017-02-07 DIAGNOSIS — Z825 Family history of asthma and other chronic lower respiratory diseases: Secondary | ICD-10-CM

## 2017-02-07 DIAGNOSIS — I471 Supraventricular tachycardia: Secondary | ICD-10-CM | POA: Diagnosis present

## 2017-02-07 DIAGNOSIS — M1990 Unspecified osteoarthritis, unspecified site: Secondary | ICD-10-CM | POA: Diagnosis present

## 2017-02-07 DIAGNOSIS — Z8249 Family history of ischemic heart disease and other diseases of the circulatory system: Secondary | ICD-10-CM | POA: Diagnosis not present

## 2017-02-07 DIAGNOSIS — Z8719 Personal history of other diseases of the digestive system: Secondary | ICD-10-CM

## 2017-02-07 DIAGNOSIS — K561 Intussusception: Secondary | ICD-10-CM | POA: Diagnosis present

## 2017-02-07 DIAGNOSIS — Z800 Family history of malignant neoplasm of digestive organs: Secondary | ICD-10-CM

## 2017-02-07 DIAGNOSIS — Z90710 Acquired absence of both cervix and uterus: Secondary | ICD-10-CM | POA: Diagnosis not present

## 2017-02-07 DIAGNOSIS — G2581 Restless legs syndrome: Secondary | ICD-10-CM | POA: Diagnosis present

## 2017-02-07 DIAGNOSIS — Z96651 Presence of right artificial knee joint: Secondary | ICD-10-CM | POA: Diagnosis present

## 2017-02-07 DIAGNOSIS — I10 Essential (primary) hypertension: Secondary | ICD-10-CM | POA: Diagnosis present

## 2017-02-07 DIAGNOSIS — K567 Ileus, unspecified: Secondary | ICD-10-CM | POA: Diagnosis not present

## 2017-02-07 DIAGNOSIS — I493 Ventricular premature depolarization: Secondary | ICD-10-CM | POA: Diagnosis present

## 2017-02-07 DIAGNOSIS — M797 Fibromyalgia: Secondary | ICD-10-CM | POA: Diagnosis present

## 2017-02-07 DIAGNOSIS — Z09 Encounter for follow-up examination after completed treatment for conditions other than malignant neoplasm: Secondary | ICD-10-CM

## 2017-02-07 DIAGNOSIS — Z9851 Tubal ligation status: Secondary | ICD-10-CM | POA: Diagnosis not present

## 2017-02-07 DIAGNOSIS — Z833 Family history of diabetes mellitus: Secondary | ICD-10-CM | POA: Diagnosis not present

## 2017-02-07 DIAGNOSIS — I498 Other specified cardiac arrhythmias: Secondary | ICD-10-CM

## 2017-02-07 DIAGNOSIS — Z8261 Family history of arthritis: Secondary | ICD-10-CM

## 2017-02-07 DIAGNOSIS — Z86010 Personal history of colonic polyps: Secondary | ICD-10-CM

## 2017-02-07 DIAGNOSIS — Z9049 Acquired absence of other specified parts of digestive tract: Secondary | ICD-10-CM | POA: Diagnosis not present

## 2017-02-07 DIAGNOSIS — Z7982 Long term (current) use of aspirin: Secondary | ICD-10-CM

## 2017-02-07 DIAGNOSIS — K219 Gastro-esophageal reflux disease without esophagitis: Secondary | ICD-10-CM | POA: Diagnosis present

## 2017-02-07 DIAGNOSIS — Z85828 Personal history of other malignant neoplasm of skin: Secondary | ICD-10-CM | POA: Diagnosis not present

## 2017-02-07 DIAGNOSIS — Z79899 Other long term (current) drug therapy: Secondary | ICD-10-CM

## 2017-02-07 DIAGNOSIS — K56609 Unspecified intestinal obstruction, unspecified as to partial versus complete obstruction: Secondary | ICD-10-CM

## 2017-02-07 DIAGNOSIS — F4323 Adjustment disorder with mixed anxiety and depressed mood: Secondary | ICD-10-CM | POA: Diagnosis present

## 2017-02-07 DIAGNOSIS — Z801 Family history of malignant neoplasm of trachea, bronchus and lung: Secondary | ICD-10-CM | POA: Diagnosis not present

## 2017-02-07 DIAGNOSIS — Z9884 Bariatric surgery status: Secondary | ICD-10-CM

## 2017-02-07 DIAGNOSIS — E860 Dehydration: Secondary | ICD-10-CM

## 2017-02-07 DIAGNOSIS — R300 Dysuria: Secondary | ICD-10-CM | POA: Diagnosis present

## 2017-02-07 DIAGNOSIS — R008 Other abnormalities of heart beat: Secondary | ICD-10-CM | POA: Diagnosis present

## 2017-02-07 DIAGNOSIS — E785 Hyperlipidemia, unspecified: Secondary | ICD-10-CM | POA: Diagnosis present

## 2017-02-07 LAB — COMPREHENSIVE METABOLIC PANEL
ALBUMIN: 4.2 g/dL (ref 3.5–5.0)
ALT: 19 U/L (ref 14–54)
ANION GAP: 10 (ref 5–15)
AST: 19 U/L (ref 15–41)
Alkaline Phosphatase: 75 U/L (ref 38–126)
BUN: 13 mg/dL (ref 6–20)
CHLORIDE: 105 mmol/L (ref 101–111)
CO2: 26 mmol/L (ref 22–32)
CREATININE: 0.73 mg/dL (ref 0.44–1.00)
Calcium: 9.3 mg/dL (ref 8.9–10.3)
GFR calc non Af Amer: >60 mL/min (ref >60)
GLUCOSE: 119 mg/dL — AB (ref 65–99)
Potassium: 4.2 mmol/L (ref 3.5–5.1)
Sodium: 141 mmol/L (ref 135–145)
Total Bilirubin: 1.1 mg/dL (ref 0.3–1.2)
Total Protein: 7.1 g/dL (ref 6.5–8.1)

## 2017-02-07 LAB — CBC
HCT: 41.4 % (ref 36.0–46.0)
HEMOGLOBIN: 13.3 g/dL (ref 12.0–15.0)
MCH: 29.4 pg (ref 26.0–34.0)
MCHC: 32.1 g/dL (ref 30.0–36.0)
MCV: 91.4 fL (ref 78.0–100.0)
Platelets: 283 10*3/uL (ref 150–400)
RBC: 4.53 MIL/uL (ref 3.87–5.11)
RDW: 14.1 % (ref 11.5–15.5)
WBC: 12.2 10*3/uL — ABNORMAL HIGH (ref 4.0–10.5)

## 2017-02-07 LAB — URINALYSIS, ROUTINE W REFLEX MICROSCOPIC
Bacteria, UA: NONE SEEN
Bilirubin Urine: NEGATIVE
Glucose, UA: NEGATIVE mg/dL
Hgb urine dipstick: NEGATIVE
KETONES UR: 20 mg/dL — AB
Nitrite: NEGATIVE
PROTEIN: 30 mg/dL — AB
Specific Gravity, Urine: 1.026 (ref 1.005–1.030)
pH: 6 (ref 5.0–8.0)

## 2017-02-07 LAB — LIPASE, BLOOD: LIPASE: 22 U/L (ref 11–51)

## 2017-02-07 LAB — MAGNESIUM: MAGNESIUM: 2 mg/dL (ref 1.7–2.4)

## 2017-02-07 LAB — I-STAT CG4 LACTIC ACID, ED: LACTIC ACID, VENOUS: 1.18 mmol/L (ref 0.5–1.9)

## 2017-02-07 MED ORDER — SODIUM CHLORIDE 0.9 % IV BOLUS (SEPSIS)
1000.0000 mL | Freq: Once | INTRAVENOUS | Status: AC
  Administered 2017-02-07: 1000 mL via INTRAVENOUS

## 2017-02-07 MED ORDER — MORPHINE SULFATE (PF) 2 MG/ML IV SOLN
4.0000 mg | Freq: Once | INTRAVENOUS | Status: AC
  Administered 2017-02-07: 4 mg via INTRAVENOUS
  Filled 2017-02-07: qty 2

## 2017-02-07 MED ORDER — ACETAMINOPHEN 325 MG PO TABS
650.0000 mg | ORAL_TABLET | Freq: Four times a day (QID) | ORAL | Status: DC | PRN
  Administered 2017-02-16: 650 mg via ORAL
  Filled 2017-02-07: qty 2

## 2017-02-07 MED ORDER — KCL-LACTATED RINGERS-D5W 20 MEQ/L IV SOLN
INTRAVENOUS | Status: DC
  Administered 2017-02-07 – 2017-02-12 (×9): via INTRAVENOUS
  Filled 2017-02-07 (×16): qty 1000

## 2017-02-07 MED ORDER — ALUM & MAG HYDROXIDE-SIMETH 200-200-20 MG/5ML PO SUSP
30.0000 mL | ORAL | Status: DC | PRN
  Administered 2017-02-07 – 2017-02-09 (×5): 30 mL via ORAL
  Filled 2017-02-07 (×5): qty 30

## 2017-02-07 MED ORDER — IOPAMIDOL (ISOVUE-300) INJECTION 61%
100.0000 mL | Freq: Once | INTRAVENOUS | Status: AC | PRN
  Administered 2017-02-07: 100 mL via INTRAVENOUS

## 2017-02-07 MED ORDER — LORAZEPAM 2 MG/ML IJ SOLN
1.0000 mg | INTRAMUSCULAR | Status: DC | PRN
  Administered 2017-02-09 – 2017-02-15 (×9): 1 mg via INTRAVENOUS
  Filled 2017-02-07 (×9): qty 1

## 2017-02-07 MED ORDER — FLEET ENEMA 7-19 GM/118ML RE ENEM: 1.0000 | ENEMA | Freq: Once | RECTAL | Status: DC | PRN

## 2017-02-07 MED ORDER — BISACODYL 10 MG RE SUPP
10.0000 mg | Freq: Every day | RECTAL | Status: DC | PRN
  Filled 2017-02-07: qty 1

## 2017-02-07 MED ORDER — PANTOPRAZOLE SODIUM 40 MG IV SOLR
40.0000 mg | Freq: Every day | INTRAVENOUS | Status: DC
  Administered 2017-02-07: 40 mg via INTRAVENOUS
  Filled 2017-02-07: qty 40

## 2017-02-07 MED ORDER — ONDANSETRON HCL 4 MG/2ML IJ SOLN
4.0000 mg | Freq: Four times a day (QID) | INTRAMUSCULAR | Status: DC | PRN
  Administered 2017-02-07 – 2017-02-11 (×7): 4 mg via INTRAVENOUS
  Filled 2017-02-07 (×9): qty 2

## 2017-02-07 MED ORDER — IOPAMIDOL (ISOVUE-300) INJECTION 61%
INTRAVENOUS | Status: AC
  Filled 2017-02-07: qty 100

## 2017-02-07 MED ORDER — FAMOTIDINE IN NACL 20-0.9 MG/50ML-% IV SOLN
20.0000 mg | Freq: Once | INTRAVENOUS | Status: AC
  Administered 2017-02-07: 20 mg via INTRAVENOUS
  Filled 2017-02-07: qty 50

## 2017-02-07 MED ORDER — ONDANSETRON HCL 4 MG/2ML IJ SOLN
4.0000 mg | Freq: Once | INTRAMUSCULAR | Status: AC
  Administered 2017-02-07: 4 mg via INTRAVENOUS
  Filled 2017-02-07: qty 2

## 2017-02-07 MED ORDER — MORPHINE SULFATE (PF) 2 MG/ML IV SOLN
2.0000 mg | INTRAVENOUS | Status: DC | PRN
  Administered 2017-02-07 – 2017-02-12 (×8): 2 mg via INTRAVENOUS
  Filled 2017-02-07 (×8): qty 1

## 2017-02-07 MED ORDER — FLUTICASONE PROPIONATE 50 MCG/ACT NA SUSP
2.0000 | Freq: Every day | NASAL | Status: DC
  Administered 2017-02-08 – 2017-02-17 (×8): 2 via NASAL
  Filled 2017-02-07: qty 16

## 2017-02-07 MED ORDER — MORPHINE SULFATE (PF) 2 MG/ML IV SOLN
4.0000 mg | INTRAVENOUS | Status: DC | PRN
  Administered 2017-02-08: 4 mg via INTRAVENOUS
  Administered 2017-02-08: 2 mg via INTRAVENOUS
  Administered 2017-02-08 – 2017-02-11 (×6): 4 mg via INTRAVENOUS
  Filled 2017-02-07 (×9): qty 2

## 2017-02-07 MED ORDER — ENOXAPARIN SODIUM 40 MG/0.4ML ~~LOC~~ SOLN
40.0000 mg | SUBCUTANEOUS | Status: DC
  Administered 2017-02-07 – 2017-02-16 (×9): 40 mg via SUBCUTANEOUS
  Filled 2017-02-07 (×9): qty 0.4

## 2017-02-07 MED ORDER — PHENOL 1.4 % MT LIQD
1.0000 | OROMUCOSAL | Status: DC | PRN
  Administered 2017-02-11: 1 via OROMUCOSAL
  Filled 2017-02-07: qty 177

## 2017-02-07 MED ORDER — ONDANSETRON HCL 4 MG PO TABS
4.0000 mg | ORAL_TABLET | Freq: Four times a day (QID) | ORAL | Status: DC | PRN
  Administered 2017-02-16 – 2017-02-17 (×2): 4 mg via ORAL
  Filled 2017-02-07 (×3): qty 1

## 2017-02-07 MED ORDER — LACTATED RINGERS IV SOLN
INTRAVENOUS | Status: DC
  Administered 2017-02-07: 13:00:00 via INTRAVENOUS

## 2017-02-07 MED ORDER — ACETAMINOPHEN 650 MG RE SUPP: 650.0000 mg | Freq: Four times a day (QID) | RECTAL | Status: DC | PRN

## 2017-02-07 NOTE — H&P (Signed)
History and Physical:    [name]   WIO:[id number] DOB: 09/01/1945 DOA: 02/07/2017  Referring MD/provider: Dr. Ellender Hose PCP: Alain Marion Evie Lacks, MD   Patient coming from: Home  Chief Complaint: "I started vomiting again".  History of Present Illness:   [name] is an 71 y.o. female with past medical history significant for recent admission for small bowel obstruction secondary to jejunojejunal intussusception who was discharged 3 days ago but now returns for recurrent abdominal pain nausea and vomiting.   On patient's last admission, her jejunojejunal intussusception apparently resolved spontaneously but she underwent laparoscopic lysis of adhesions per general surgery. She was treated conservatively with bowel rest with good effect and her diet was advanced. Patient states that she ate pot roast and [place] on the day before and day of admission without difficulty. Patient states she did well for 2 days until yesterday when she noted onset of nausea and new associated epigastric pain followed by vomiting. Symptoms started at 5 PM last night after she ate 2 bites of a country fried steak. Patient states she's been vomiting all night and has not had anything further to eat since then. Patient admits to feeling thirsty with a dry mouth.  Patient denies fevers or chills. Denies recent alcohol use. Denies marijuana use.  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 Patient states she has never heard of trigeminy or bigeminy before. Patient denies chest pain, palpitations, dizziness at present.  ED Course:  The patient   ROS:   ROS review of systems otherwise negative.  Past Medical History:   Past Medical History:  Diagnosis Date  . Anxiety   . B12 DEFICIENCY   . Cholecystitis, unspecified   . DEPRESSION   . Diastolic dysfunction   . DIVERTICULOSIS, COLON   . DYSLIPIDEMIA   . Fibromyalgia   . GERD   . GERD  (gastroesophageal reflux disease)   . Hyperplastic colon polyp   . HYPERTENSION   . INSOMNIA, PERSISTENT   . Morbid obesity ([place])   . OSTEOARTHRITIS   . PONV (postoperative nausea and vomiting)   . Skin cancer 2018   upper lip   . SYNCOPE   . Ulcer of esophagus   . VITAMIN D DEFICIENCY     Past Surgical History:   Past Surgical History:  Procedure Laterality Date  . ABDOMINAL HYSTERECTOMY    . APPENDECTOMY    . BREAST BIOPSY Left 2010   Stero biopsy  . BREAST EXCISIONAL BIOPSY Left 1985   Benign   . CESAREAN SECTION    . CHOLECYSTECTOMY    . GASTROPLASTY    . gatric stapling    . LAPAROSCOPY N/A 01/30/2017   Procedure: LAPAROSCOPY DIAGNOSTIC WITH LAPAROSCOPIC LYSIS OF ADHESIONS;  Surgeon: Excell Seltzer, MD;  Location: [facility];  Service: General;  Laterality: N/A;  . MIDDLE EAR SURGERY Right   . revearsal of gastric stapling    . TONSILLECTOMY    . TOTAL KNEE ARTHROPLASTY     rt.  . TUBAL LIGATION    . TYMPANOPLASTY Right 10/22/2016   Procedure: RIGHT TYMPANOPLASTY;  Surgeon: Izora Gala, MD;  Location: [facility];  Service: ENT;  Laterality: Right;    Social History:   Social History   Social History  . Marital status: Married    Spouse name: N/A  . Number of children: 2  . Years of education: N/A  Occupational History  . office work    Social History Main Topics  . Smoking status: Never Smoker  . Smokeless tobacco: Never Used  . Alcohol use No  . Drug use: No  . Sexual activity: Not on file   Other Topics Concern  . Not on file   Social History Narrative  . No narrative on file    Allergies   Levaquin [levofloxacin]  Family history:   Family History  Problem Relation Age of Onset  . Emphysema Father   . Heart disease Father   . Arthritis Mother   . Hypertension Mother        pulmonary  . Heart disease Mother   . Colon cancer Maternal Aunt   . Diabetes Sister   . Lung cancer Sister   . Colon cancer Maternal Uncle     . Colon cancer Other        nephew  . Diabetes Maternal Aunt        x 4  . Diabetes Maternal Uncle        x 3  . Diabetes Paternal Uncle         1  . Kidney disease Other        niece  . Esophageal cancer Neg Hx   . Liver disease Neg Hx   . Breast cancer Neg Hx     Current Medications:   Prior to Admission medications   Medication Sig Start Date End Date Taking? Authorizing Provider  acetaminophen (TYLENOL) 500 MG tablet Take 1,000 mg by mouth every 6 (six) hours as needed for mild pain.    Yes [provider]  ALPRAZolam (XANAX) 0.5 MG tablet Take 1 tablet (0.5 mg total) by mouth 2 (two) times daily as needed. 01/16/17  Yes Plotnikov, Evie Lacks, MD  aspirin 81 MG tablet Take 81 mg by mouth daily.     Yes [provider]  buPROPion (WELLBUTRIN SR) 150 MG 12 hr tablet Take 1 tablet by mouth  twice a day 01/02/16  Yes Plotnikov, Evie Lacks, MD  Cholecalciferol (VITAMIN D) 2000 UNITS CAPS Take 1 capsule by mouth daily.     Yes [provider]  diclofenac sodium (VOLTAREN) 1 % GEL Apply to large joints up to TID as directed 01/16/17  Yes Plotnikov, Evie Lacks, MD  fish oil-omega-3 fatty acids 1000 MG capsule Take by mouth daily. Take  600 mg daily   Yes [provider]  fluticasone (FLONASE) 50 MCG/ACT nasal spray Use 2 sprays in each  nostril daily 12/30/15  Yes Plotnikov, Evie Lacks, MD  gabapentin (NEURONTIN) 100 MG capsule Take 1 capsule (100 mg total) by mouth at bedtime. Annual appt due in July must see MD for refills 12/14/16  Yes Plotnikov, Evie Lacks, MD  lovastatin (MEVACOR) 40 MG tablet Take 1 tablet (40 mg total) by mouth at bedtime. 01/29/17  Yes Plotnikov, Evie Lacks, MD  lubiprostone (AMITIZA) 24 MCG capsule Take 1 capsule (24 mcg total) by mouth 2 (two) times daily with a meal. 10/14/15  Yes Esterwood, Amy S, PA-C  Multiple Vitamins-Minerals (OCUVITE PO) Take 2 tablets by mouth daily.   Yes [provider]  omeprazole (PRILOSEC) 40 MG  capsule TAKE 1 CAPSULE BY MOUTH  TWICE DAILY BEFORE MEALS 04/12/16  Yes Irene Shipper, MD  oxyCODONE (OXY IR/ROXICODONE) 5 MG immediate release tablet Take 1 tablet (5 mg total) by mouth every 4 (four) hours as needed for moderate pain or severe pain. 02/03/17  Yes  Lavina Hamman, MD  polyethylene glycol [facility]) packet Take 17 g by mouth daily. 02/03/17  Yes Lavina Hamman, MD  Probiotic Product (ALIGN) 4 MG CAPS Take 1 capsule (4 mg total) by mouth daily. 06/21/13  Yes Plotnikov, Evie Lacks, MD  traZODone (DESYREL) 50 MG tablet Take 1 tablet (50 mg total) by mouth at bedtime. 11/22/16 05/21/17 Yes Panwala, Naitik, PA-C  verapamil (CALAN-SR) 240 MG CR tablet TAKE 1 TABLET BY MOUTH AT  BEDTIME. YEARLY PHYSICAL  W/LABS DUE IN JULY MUST SEE MD FOR REFILLS 11/22/16  Yes Plotnikov, Evie Lacks, MD  VESICARE 10 MG tablet Take 10 mg by mouth daily.  12/07/14  Yes [provider]  vitamin B-12 (CYANOCOBALAMIN) 1000 MCG tablet Take 500 mcg by mouth daily.    Yes [provider]  butalbital-acetaminophen-caffeine (FIORICET WITH CODEINE) 50-325-40-30 MG per capsule Take 1 capsule by mouth every 6 (six) hours as needed for headache. Patient not taking: Reported on 01/26/2017 12/24/14   Plotnikov, Evie Lacks, MD    Physical Exam:   Vitals:   02/07/17 0739 02/07/17 1239  BP: (!) 117/104 (!) 116/55  Pulse: 98 81  Resp: 16 16  Temp: 98.2 F (36.8 C)   TempSrc: Oral   SpO2: 98% 95%     Physical Exam: Blood pressure (!) 116/55, pulse 81, temperature 98.2 F (36.8 C), temperature source Oral, resp. rate 16, SpO2 95 %. Gen: Friendly patient lying in bed in no apparent distress. Eyes: Sclerae anicteric. Conjunctiva mildly injected. Mouth: Mucous members are quite dry. Neck: Supple, no jugular venous distention. Chest: Moderately good air entry bilaterally with no adventitious sounds.  CV: Irregularly irregular heart rate with corresponding pulse, no audible murmurs. Abdomen: Patient does have  normoactive bowel sounds that are normal in pitch, her abdomen is soft with perhaps minimal distention versus obesity, she has minimal tenderness at the epigastric region to moderate palpation, she has no rebound tenderness, no guarding and no tenderness anywhere else in her abdomen. Extremities: No edema.  Skin: Warm and dry. No rashes, lesions or wounds. Neuro: Alert and oriented times 3; grossly nonfocal. Psych: Patient is cooperative, logical and coherent with appropriate mood and affect.  Data Review:    Labs: Basic Metabolic Panel:  Recent Labs Lab 02/01/17 0512 02/02/17 0535 02/03/17 0516 02/07/17 0846  NA 141 142 144 141  K 3.5 3.4* 3.8 4.2  CL 106 106 107 105  CO2 30 30 32 26  GLUCOSE 96 100* 93 119*  BUN 6 9 7 13   CREATININE 0.70 0.77 0.71 0.73  CALCIUM 8.5* 8.7* 8.8* 9.3  MG 1.8  --   --   --    Liver Function Tests:  Recent Labs Lab 02/07/17 0846  AST 19  ALT 19  ALKPHOS 75  BILITOT 1.1  PROT 7.1  ALBUMIN 4.2    Recent Labs Lab 02/07/17 0846  LIPASE 22   No results for input(s): AMMONIA in the last 168 hours. CBC:  Recent Labs Lab 02/01/17 0512 02/02/17 0535 02/03/17 0516 02/07/17 0846  WBC 6.1 6.2 5.9 12.2*  HGB 11.3* 11.3* 11.3* 13.3  HCT 35.7* 35.4* 35.4* 41.4  MCV 90.6 91.9 91.2 91.4  PLT 187 204 211 283   Cardiac Enzymes: No results for input(s): CKTOTAL, CKMB, CKMBINDEX, TROPONINI in the last 168 hours.  BNP (last 3 results) No results for input(s): PROBNP in the last 8760 hours. CBG:  Recent Labs Lab 02/01/17 0715 02/02/17 0753 02/03/17 0724  GLUCAP 87 78 92  Urinalysis    Component Value Date/Time   COLORURINE YELLOW 02/07/2017 0847   APPEARANCEUR CLEAR 02/07/2017 0847   LABSPEC 1.026 02/07/2017 0847   PHURINE 6.0 02/07/2017 0847   GLUCOSEU NEGATIVE 02/07/2017 0847   GLUCOSEU NEGATIVE 01/16/2017 1011   HGBUR NEGATIVE 02/07/2017 0847   BILIRUBINUR NEGATIVE 02/07/2017 0847   BILIRUBINUR neg 01/26/2017 1031    KETONESUR 20 (A) 02/07/2017 0847   PROTEINUR 30 (A) 02/07/2017 0847   UROBILINOGEN 0.2 01/26/2017 1031   UROBILINOGEN 0.2 01/16/2017 1011   NITRITE NEGATIVE 02/07/2017 0847   LEUKOCYTESUR TRACE (A) 02/07/2017 0847      Radiographic Studies: Ct Abdomen Pelvis W Contrast  Result Date: 02/07/2017 CLINICAL DATA:  Vomiting, lower abdominal pain. Recent surgery for intussusception. EXAM: CT ABDOMEN AND PELVIS WITH CONTRAST TECHNIQUE: Multidetector CT imaging of the abdomen and pelvis was performed using the standard protocol following bolus administration of intravenous contrast. CONTRAST:  140mL ISOVUE-300 IOPAMIDOL (ISOVUE-300) INJECTION 61% COMPARISON:  01/27/2017 FINDINGS: Lower chest: Lung bases are clear. No effusions. Heart is normal size. Hepatobiliary: Cysts scattered throughout the liver. Prior cholecystectomy. Pancreas: No focal abnormality or ductal dilatation. Spleen: No focal abnormality.  Normal size. Adrenals/Urinary Tract: No adrenal abnormality. No focal renal abnormality. No stones or hydronephrosis. Urinary bladder is unremarkable. Stomach/Bowel: Postoperative changes within the stomach. Mildly prominent proximal and mid small bowel loops. Distal small bowel loops are decompressed. Is difficult to completely exclude low grade partial small bowel obstruction. This could reflect mild ileus. Small amount amount of interloop fluid and edema noted within the small bowel mesentery in the left abdomen. No visible colonic abnormality. Vascular/Lymphatic: Aortic and iliac calcifications. No aneurysm or adenopathy. Reproductive: Prior hysterectomy.  No adnexal masses. Other: Trace free fluid in the pelvis. Musculoskeletal: No acute bony abnormality. IMPRESSION: Mildly prominent proximal and mid small bowel loops with decompressed distal small bowel. This could reflect focal ileus although low grade small bowel obstruction cannot be excluded. Small amount of interloop free fluid and edema within the  small bowel mesentery. Trace free fluid in the pelvis. Hepatic cysts, stable. Aortoiliac atherosclerosis. Electronically Signed   By: Rolm Baptise [name]   On: 02/07/2017 10:47    EKG: Ordered and is pending   Assessment/Plan:   Active Problems:   Adjustment disorder with mixed anxiety and depressed mood   Essential hypertension   GERD   Restless legs syndrome (RLS)   SBO (small bowel obstruction) (HCC)   Ventricular trigeminy  SBO Versus ILEUS Will treat patient conservatively with bowel rest, IV hydration and electrolyte supplementation as needed. No need for NG tube at present as patient is not vomiting and appears quite comfortable. General surgery consult pending.  EPIGASTRIC PAIN Lipase is normal so I don't think her nausea and vomiting are attributable to pancreatitis. Will increase her by mouth omeprazole to IV pantoprazole and see if that is helpful. Epigastric pain may also be muscular pain from from dry heaves and vomiting.  TRIGEMINY Patient with a fair amount of ectopy and long episodes of trigeminy. Patient states she does have a history of ectopy with PVCs since she was 67. Patient is asymptomatic and denies chest pain or palpitations or dizziness. I wonder if some of this may be because she has been off her verapamil. Will place patient on telemetry and monitor closely. Potassium is within normal limits, magnesium is pending.  HTN Verapamil 240 daily is being held as patient is nothing by mouth. Blood pressure is within normal limits right now most likely  because she is intravascularly fluid depleted. Can consider adding IV diltiazem versus metoprolol on a standing basis if blood pressure rises and needs better control.  GERD IV pantoprazole to substitute for oral omeprazole.  DEPRESSION Venlafaxine, Xanax, trazodone and bupropion are being held as patient is nothing by mouth. I will order when necessary IV lorazepam for management of anxiety and/or  withdrawal.  HL Lovastatin being held      Other information:   DVT prophylaxis: Lovenox ordered. Code Status: Full code. Family Communication: Patient's daughter was at bedside throughout the encounter. Disposition Plan: Home Consults called: General surgery per ED Admission status: Inpatient   The medical decision making on this patient was of high complexity and the patient is at high risk for clinical deterioration, therefore this is a level 3 visit.  The medical decision making is of moderate complexity, therefore this is a level 2 visit.  Dewaine Oats Derek Jack [facility] Pager [phone] Cell: [phone]   If 7PM-7AM, please contact night-coverage [email or web address] Password [email or web address] 02/07/2017, 1:10 PM

## 2017-02-07 NOTE — ED Triage Notes (Signed)
Pt states that she was here and recently discharged this week after surgery for scar tissue and intussuception. Began vomiting and having lower abdominal pain last night. Alert and oriented.

## 2017-02-07 NOTE — ED Provider Notes (Signed)
[facility] DEPT Provider Note   CSN: [id number] Arrival date & time: 02/07/17  2671     History   Chief Complaint Chief Complaint  Patient presents with  . Abdominal Pain    HPI [name] is a 71 y.o. female.  HPI 71 year old female with extensive past medical history including multiple intra-abdominal surgeries status post recent admission and lysis of adhesions several weeks ago who presents with worsening abdominal pain. The patient states that she returned from the hospital on Sunday. She was feeling mildly improved at that time. Over the last several days, she has noticed progressively worsening abdominal pain. Over the last 24 hours, she has had little to no bowel movements. She continues to pass a small amount of flatus. She has had subsequent gradual onset of aching, throbbing, epigastric and left lower quadrant pain. She has been vomiting nonstop for the last 24 hours. Her vomit has been dark green. She states her symptoms feel similar to her previous obstructions. She denies any fevers or chills. No urinary symptoms, though she did have some mild darkened urine this morning. No flank pain. No vaginal bleeding.  Past Medical History:  Diagnosis Date  . Anxiety   . B12 DEFICIENCY   . Cholecystitis, unspecified   . DEPRESSION   . Diastolic dysfunction   . DIVERTICULOSIS, COLON   . DYSLIPIDEMIA   . Fibromyalgia   . GERD   . GERD (gastroesophageal reflux disease)   . Hyperplastic colon polyp   . HYPERTENSION   . INSOMNIA, PERSISTENT   . Morbid obesity ([place])   . OSTEOARTHRITIS   . PONV (postoperative nausea and vomiting)   . Skin cancer 2018   upper lip   . SYNCOPE   . Ulcer of esophagus   . VITAMIN D DEFICIENCY     Patient Active Problem List   Diagnosis Date Noted  . SBO (small bowel obstruction) ([place]) 02/07/2017  . 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  . Abdominal pain, LLQ 01/26/2017  . Intussusception ([place]) 01/26/2017  . Hearing loss 07/13/2016  .  Influenza 09/16/2015  . Headache 12/24/2014  . Neoplasm of uncertain behavior of skin 12/24/2014  . 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 Dysuria 04/07/2014  . Cough 08/25/2013  . Well adult exam 06/16/2013  . Sinusitis, chronic 06/09/2013  . URI, acute 06/09/2012  . Restless legs syndrome (RLS) 01/26/2011  . FINGER PAIN 07/28/2010  . PALPITATIONS 10/18/2008  . VITAMIN D DEFICIENCY 10/09/2008  . DYSLIPIDEMIA 10/09/2008  . Morbid obesity ([place]) 10/09/2008  . Essential hypertension 10/09/2008  . SYNCOPE 09/27/2008  . UNSPECIFIED EUSTACHIAN TUBE DISORDER 04/09/2008  . Anxiety state 10/07/2007  . INSOMNIA, PERSISTENT 07/08/2007  . Adjustment disorder with mixed anxiety and depressed mood 07/08/2007  . RHINITIS 07/08/2007  . B12 deficiency 04/07/2007  . ANEMIA, DUE TO BLOOD LOSS 04/07/2007  . OSTEOARTHRITIS 04/07/2007  . GERD 04/04/2007  . Diverticulosis of colon 04/04/2007    Past Surgical History:  Procedure Laterality Date  . ABDOMINAL HYSTERECTOMY    . APPENDECTOMY    . BREAST BIOPSY Left 2010   Stero biopsy  . BREAST EXCISIONAL BIOPSY Left 1985   Benign   . CESAREAN SECTION    . CHOLECYSTECTOMY    . GASTROPLASTY    . gatric stapling    . LAPAROSCOPY N/A 01/30/2017   Procedure: LAPAROSCOPY DIAGNOSTIC WITH LAPAROSCOPIC LYSIS OF ADHESIONS;  Surgeon: Excell Seltzer, MD;  Location: [facility];  Service: General;  Laterality: N/A;  . MIDDLE EAR SURGERY Right   .  revearsal of gastric stapling    . TONSILLECTOMY    . TOTAL KNEE ARTHROPLASTY     rt.  . TUBAL LIGATION    . TYMPANOPLASTY Right 10/22/2016   Procedure: RIGHT TYMPANOPLASTY;  Surgeon: Izora Gala, MD;  Location: [facility];  Service: ENT;  Laterality: Right;    OB History    No data available       Home Medications    Prior to Admission medications   Medication Sig Start Date End Date Taking? Authorizing Provider  acetaminophen (TYLENOL) 500 MG tablet Take 1,000 mg by mouth every 6 (six)  hours as needed for mild pain.    Yes [provider]  ALPRAZolam (XANAX) 0.5 MG tablet Take 1 tablet (0.5 mg total) by mouth 2 (two) times daily as needed. 01/16/17  Yes Plotnikov, Evie Lacks, MD  aspirin 81 MG tablet Take 81 mg by mouth daily.     Yes [provider]  buPROPion (WELLBUTRIN SR) 150 MG 12 hr tablet Take 1 tablet by mouth  twice a day 01/02/16  Yes Plotnikov, Evie Lacks, MD  Cholecalciferol (VITAMIN D) 2000 UNITS CAPS Take 1 capsule by mouth daily.     Yes [provider]  diclofenac sodium (VOLTAREN) 1 % GEL Apply to large joints up to TID as directed 01/16/17  Yes Plotnikov, Evie Lacks, MD  fish oil-omega-3 fatty acids 1000 MG capsule Take by mouth daily. Take  600 mg daily   Yes [provider]  fluticasone (FLONASE) 50 MCG/ACT nasal spray Use 2 sprays in each  nostril daily 12/30/15  Yes Plotnikov, Evie Lacks, MD  gabapentin (NEURONTIN) 100 MG capsule Take 1 capsule (100 mg total) by mouth at bedtime. Annual appt due in July must see MD for refills 12/14/16  Yes Plotnikov, Evie Lacks, MD  lovastatin (MEVACOR) 40 MG tablet Take 1 tablet (40 mg total) by mouth at bedtime. 01/29/17  Yes Plotnikov, Evie Lacks, MD  lubiprostone (AMITIZA) 24 MCG capsule Take 1 capsule (24 mcg total) by mouth 2 (two) times daily with a meal. 10/14/15  Yes Esterwood, Amy S, PA-C  Multiple Vitamins-Minerals (OCUVITE PO) Take 2 tablets by mouth daily.   Yes [provider]  omeprazole (PRILOSEC) 40 MG capsule TAKE 1 CAPSULE BY MOUTH  TWICE DAILY BEFORE MEALS 04/12/16  Yes Irene Shipper, MD  oxyCODONE (OXY IR/ROXICODONE) 5 MG immediate release tablet Take 1 tablet (5 mg total) by mouth every 4 (four) hours as needed for moderate pain or severe pain. 02/03/17  Yes Lavina Hamman, MD  polyethylene glycol [facility]) packet Take 17 g by mouth daily. 02/03/17  Yes Lavina Hamman, MD  Probiotic Product (ALIGN) 4 MG CAPS Take 1 capsule (4 mg total) by mouth daily. 06/21/13  Yes Plotnikov,  Evie Lacks, MD  traZODone (DESYREL) 50 MG tablet Take 1 tablet (50 mg total) by mouth at bedtime. 11/22/16 05/21/17 Yes Panwala, Naitik, PA-C  verapamil (CALAN-SR) 240 MG CR tablet TAKE 1 TABLET BY MOUTH AT  BEDTIME. YEARLY PHYSICAL  W/LABS DUE IN JULY MUST SEE MD FOR REFILLS 11/22/16  Yes Plotnikov, Evie Lacks, MD  VESICARE 10 MG tablet Take 10 mg by mouth daily.  12/07/14  Yes [provider]  vitamin B-12 (CYANOCOBALAMIN) 1000 MCG tablet Take 500 mcg by mouth daily.    Yes [provider]  butalbital-acetaminophen-caffeine (FIORICET WITH CODEINE) 50-325-40-30 MG per capsule Take 1 capsule by mouth every 6 (six) hours as needed for headache. Patient not taking:  Reported on 01/26/2017 12/24/14   Plotnikov, Evie Lacks, MD    Family History Family History  Problem Relation Age of Onset  . Emphysema Father   . Heart disease Father   . Arthritis Mother   . Hypertension Mother        pulmonary  . Heart disease Mother   . Colon cancer Maternal Aunt   . Diabetes Sister   . Lung cancer Sister   . Colon cancer Maternal Uncle   . Colon cancer Other        nephew  . Diabetes Maternal Aunt        x 4  . Diabetes Maternal Uncle        x 3  . Diabetes Paternal Uncle         1  . Kidney disease Other        niece  . Esophageal cancer Neg Hx   . Liver disease Neg Hx   . Breast cancer Neg Hx     Social History Social History  Substance Use Topics  . Smoking status: Never Smoker  . Smokeless tobacco: Never Used  . Alcohol use No     Allergies   Levaquin [levofloxacin]   Review of Systems Review of Systems  Constitutional: Positive for fatigue. Negative for chills and fever.  HENT: Negative for congestion, rhinorrhea and sore throat.   Eyes: Negative for visual disturbance.  Respiratory: Negative for cough, shortness of breath and wheezing.   Cardiovascular: Negative for chest pain and leg swelling.  Gastrointestinal: Positive for abdominal distention, abdominal pain,  constipation, nausea and vomiting. Negative for diarrhea.  Genitourinary: Negative for dysuria, flank pain, vaginal bleeding and vaginal discharge.  Musculoskeletal: Negative for neck pain.  Skin: Negative for rash.  Allergic/Immunologic: Negative for immunocompromised state.  Neurological: Positive for weakness. Negative for syncope and headaches.  Hematological: Does not bruise/bleed easily.  All other systems reviewed and are negative.    Physical Exam Updated Vital Signs BP 138/71 (BP Location: Left Arm)   Pulse 96   Temp 98 F (36.7 C) (Oral)   Resp 18   Ht 5' 0.5" (1.537 m)   Wt 91.2 kg (201 lb 1 oz)   SpO2 92%   BMI 38.62 kg/m   Physical Exam  Constitutional: She is oriented to person, place, and time. She appears well-developed and well-nourished. No distress.  HENT:  Head: Normocephalic and atraumatic.  Mildly dry mucous membranes  Eyes: Conjunctivae are normal.  Neck: Neck supple.  Cardiovascular: Normal rate, regular rhythm and normal heart sounds.  Exam reveals no friction rub.   No murmur heard. Pulmonary/Chest: Effort normal and breath sounds normal. No respiratory distress. She has no wheezes. She has no rales.  Abdominal: Soft. She exhibits no distension.  Hypoactive bowel sounds. Laparoscopy scars are clean, dry, and intact with Dermabond in place. Mild surrounding ecchymoses to lower abdominal port sites. Abdomen is tender in the epigastric and left lower quadrant areas. No rebound.  Musculoskeletal: She exhibits no edema.  Neurological: She is alert and oriented to person, place, and time. She exhibits normal muscle tone.  Skin: Skin is warm. Capillary refill takes less than 2 seconds.  Psychiatric: She has a normal mood and affect.  Nursing note and vitals reviewed.    ED Treatments / Results  Labs (all labs ordered are listed, but only abnormal results are displayed) Labs Reviewed  COMPREHENSIVE METABOLIC PANEL - Abnormal; Notable for the  following:       Result  Value   Glucose, Bld 119 (*)    All other components within normal limits  CBC - Abnormal; Notable for the following:    WBC 12.2 (*)    All other components within normal limits  URINALYSIS, ROUTINE W REFLEX MICROSCOPIC - Abnormal; Notable for the following:    Ketones, ur 20 (*)    Protein, ur 30 (*)    Leukocytes, UA TRACE (*)    Squamous Epithelial / LPF 0-5 (*)    All other components within normal limits  LIPASE, BLOOD  MAGNESIUM  COMPREHENSIVE METABOLIC PANEL  CBC  I-STAT CG4 LACTIC ACID, ED    EKG  EKG Interpretation None       Radiology Ct Abdomen Pelvis W Contrast  Result Date: 02/07/2017 CLINICAL DATA:  Vomiting, lower abdominal pain. Recent surgery for intussusception. EXAM: CT ABDOMEN AND PELVIS WITH CONTRAST TECHNIQUE: Multidetector CT imaging of the abdomen and pelvis was performed using the standard protocol following bolus administration of intravenous contrast. CONTRAST:  123mL ISOVUE-300 IOPAMIDOL (ISOVUE-300) INJECTION 61% COMPARISON:  01/27/2017 FINDINGS: Lower chest: Lung bases are clear. No effusions. Heart is normal size. Hepatobiliary: Cysts scattered throughout the liver. Prior cholecystectomy. Pancreas: No focal abnormality or ductal dilatation. Spleen: No focal abnormality.  Normal size. Adrenals/Urinary Tract: No adrenal abnormality. No focal renal abnormality. No stones or hydronephrosis. Urinary bladder is unremarkable. Stomach/Bowel: Postoperative changes within the stomach. Mildly prominent proximal and mid small bowel loops. Distal small bowel loops are decompressed. Is difficult to completely exclude low grade partial small bowel obstruction. This could reflect mild ileus. Small amount amount of interloop fluid and edema noted within the small bowel mesentery in the left abdomen. No visible colonic abnormality. Vascular/Lymphatic: Aortic and iliac calcifications. No aneurysm or adenopathy. Reproductive: Prior hysterectomy.  No  adnexal masses. Other: Trace free fluid in the pelvis. Musculoskeletal: No acute bony abnormality. IMPRESSION: Mildly prominent proximal and mid small bowel loops with decompressed distal small bowel. This could reflect focal ileus although low grade small bowel obstruction cannot be excluded. Small amount of interloop free fluid and edema within the small bowel mesentery. Trace free fluid in the pelvis. Hepatic cysts, stable. Aortoiliac atherosclerosis. Electronically Signed   By: Rolm Baptise [name]   On: 02/07/2017 10:47    Procedures Procedures (including critical care time)  Medications Ordered in ED Medications  iopamidol (ISOVUE-300) 61 % injection (not administered)  fluticasone (FLONASE) 50 MCG/ACT nasal spray 2 spray (2 sprays Each Nare Not Given 02/07/17 1714)  enoxaparin (LOVENOX) injection 40 mg (40 mg Subcutaneous Given 02/07/17 1731)  dextrose 5% in lactated ringers with KCl 20 mEq/L infusion ( Intravenous New Bag/Given 02/07/17 1731)  acetaminophen (TYLENOL) tablet 650 mg (not administered)    Or  acetaminophen (TYLENOL) suppository 650 mg (not administered)  sodium phosphate (FLEET) 7-19 GM/118ML enema 1 enema (not administered)  bisacodyl (DULCOLAX) suppository 10 mg (not administered)  ondansetron (ZOFRAN) tablet 4 mg ( Oral See Alternative 02/07/17 1618)    Or  ondansetron (ZOFRAN) injection 4 mg (4 mg Intravenous Given 02/07/17 1618)  morphine 2 MG/ML injection 4 mg (not administered)  morphine 2 MG/ML injection 2 mg (2 mg Intravenous Given 02/07/17 1618)  pantoprazole (PROTONIX) injection 40 mg (not administered)  LORazepam (ATIVAN) injection 1 mg (not administered)  alum & mag hydroxide-simeth (MAALOX/MYLANTA) 200-200-20 MG/5ML suspension 30 mL (30 mLs Oral Given 02/07/17 1753)  phenol (CHLORASEPTIC) mouth spray 1 spray (not administered)  sodium chloride 0.9 % bolus 1,000 mL (0 mLs Intravenous Stopped 02/07/17 1025)  ondansetron [facility]) injection 4 mg (4 mg Intravenous Given  02/07/17 0849)  morphine 2 MG/ML injection 4 mg (4 mg Intravenous Given 02/07/17 0849)  iopamidol (ISOVUE-300) 61 % injection 100 mL (100 mLs Intravenous Contrast Given 02/07/17 1018)  famotidine (PEPCID) IVPB 20 mg premix (0 mg Intravenous Stopped 02/07/17 1157)  morphine 2 MG/ML injection 4 mg (4 mg Intravenous Given 02/07/17 1255)     Initial Impression / Assessment and Plan / ED Course  I have reviewed the triage vital signs and the nursing notes.  Pertinent labs & imaging results that were available during my care of the patient were reviewed by me and considered in my medical decision making (see chart for details).     71 year old female here with mild, diffuse abdominal pain with bilious emesis. Concern for recurrent obstruction in the setting of history of same as well as recent lysis of adhesions. Differential also includes constipation after procedure was subsequent ileus. Will check labs and repeat CT scan. Patient does appear mildly dehydrated and will give fluids.  Labs, imaging overall reassuring. Pt mildly dehydrated, as expected. LA normal however. CT scan is c/w ileus, with no anatomic obstruction. Pt is persistently vomiting. Will give fluids, admit for bowel regimen, fluids, and monitoring.  This note was prepared with assistance of Systems analyst. Occasional wrong-word or sound-a-like substitutions may have occurred due to the inherent limitations of voice recognition software.  Final Clinical Impressions(s) / ED Diagnoses   Final diagnoses:  Ileus, postoperative [facility])  Dehydration    New Prescriptions Current Discharge Medication List       Duffy Bruce, MD 02/07/17 [phone]

## 2017-02-07 NOTE — ED Notes (Signed)
Robert Bellow receiving RN for rm [id number]. Report scheduled 1420

## 2017-02-08 DIAGNOSIS — K9189 Other postprocedural complications and disorders of digestive system: Secondary | ICD-10-CM

## 2017-02-08 DIAGNOSIS — K567 Ileus, unspecified: Principal | ICD-10-CM

## 2017-02-08 LAB — CBC
HEMATOCRIT: 37.2 % (ref 36.0–46.0)
HEMOGLOBIN: 11.7 g/dL — AB (ref 12.0–15.0)
MCH: 29.3 pg (ref 26.0–34.0)
MCHC: 31.5 g/dL (ref 30.0–36.0)
MCV: 93 fL (ref 78.0–100.0)
Platelets: 236 10*3/uL (ref 150–400)
RBC: 4 MIL/uL (ref 3.87–5.11)
RDW: 14.4 % (ref 11.5–15.5)
WBC: 8 10*3/uL (ref 4.0–10.5)

## 2017-02-08 LAB — COMPREHENSIVE METABOLIC PANEL
ALT: 15 U/L (ref 14–54)
ANION GAP: 5 (ref 5–15)
AST: 15 U/L (ref 15–41)
Albumin: 3.2 g/dL — ABNORMAL LOW (ref 3.5–5.0)
Alkaline Phosphatase: 61 U/L (ref 38–126)
BILIRUBIN TOTAL: 1 mg/dL (ref 0.3–1.2)
BUN: 10 mg/dL (ref 6–20)
CO2: 26 mmol/L (ref 22–32)
Calcium: 8.5 mg/dL — ABNORMAL LOW (ref 8.9–10.3)
Chloride: 111 mmol/L (ref 101–111)
Creatinine, Ser: 0.72 mg/dL (ref 0.44–1.00)
Glucose, Bld: 113 mg/dL — ABNORMAL HIGH (ref 65–99)
POTASSIUM: 4.1 mmol/L (ref 3.5–5.1)
Sodium: 142 mmol/L (ref 135–145)
TOTAL PROTEIN: 5.7 g/dL — AB (ref 6.5–8.1)

## 2017-02-08 MED ORDER — PANTOPRAZOLE SODIUM 40 MG IV SOLR
40.0000 mg | Freq: Two times a day (BID) | INTRAVENOUS | Status: DC
  Administered 2017-02-08 – 2017-02-16 (×17): 40 mg via INTRAVENOUS
  Filled 2017-02-08 (×18): qty 40

## 2017-02-08 MED ORDER — ONDANSETRON HCL 4 MG/2ML IJ SOLN
4.0000 mg | Freq: Once | INTRAMUSCULAR | Status: AC
  Administered 2017-02-08: 4 mg via INTRAVENOUS
  Filled 2017-02-08: qty 2

## 2017-02-08 NOTE — Progress Notes (Signed)
[facility] Progress Note     Subjective: CC: abdominal pain and nausea Patient presented to [facility] ED yesterday with 2 days of nausea and vomiting. Recently discharge on 8/19 for intussusception which did not require bowel resection, but she did have extensive LOA on 8/15. Was passing flatus at home but never had a BM, has had previous issues with constipation. No longer passing flatus. Still having nausea but no vomiting. Notes abdominal pain is slightly worsened this morning but improved now. She is getting around alright. Denies fevers, chills, chest pain, SOB.   Objective: Vital signs in last 24 hours: Temp:  [98 F (36.7 C)] 98 F (36.7 C) (08/24 0648) Pulse Rate:  [77-96] 77 (08/24 0648) Resp:  [16-18] 16 (08/24 0648) BP: (116-152)/(55-71) 135/55 (08/24 0648) SpO2:  [91 %-96 %] 96 % (08/24 0648) Weight:  [91.2 kg (201 lb 1 oz)] 91.2 kg (201 lb 1 oz) (08/23 1608) Last BM Date: 02/06/17  Intake/Output from previous day: 08/23 0701 - 08/24 0700 In: 1248.3 [I.V.:1248.3] Out: 25 [Emesis/NG output:25] Intake/Output this shift: No intake/output data recorded.  PE: Gen:  Alert, NAD, pleasant Card:  Regular rate and rhythm, pedal pulses 2+ BL Pulm:  Normal effort, clear to auscultation bilaterally Abd: Soft, mildly tender in epigastric region, non-distended, bowel sounds hypoactive, no HSM, incisions C/D/I Skin: warm and dry, no rashes  Psych: A&Ox3   Lab Results:   Recent Labs  02/07/17 0846 02/08/17 0437  WBC 12.2* 8.0  HGB 13.3 11.7*  HCT 41.4 37.2  PLT 283 236   BMET  Recent Labs  02/07/17 0846 02/08/17 0437  NA 141 142  K 4.2 4.1  CL 105 111  CO2 26 26  GLUCOSE 119* 113*  BUN 13 10  CREATININE 0.73 0.72  CALCIUM 9.3 8.5*   PT/INR No results for input(s): LABPROT, INR in the last 72 hours. CMP     Component Value Date/Time   NA 142 02/08/2017 0437   K 4.1 02/08/2017 0437   CL 111 02/08/2017 0437   CO2 26 02/08/2017 0437   GLUCOSE 113  (H) 02/08/2017 0437   BUN 10 02/08/2017 0437   CREATININE 0.72 02/08/2017 0437   CALCIUM 8.5 (L) 02/08/2017 0437   PROT 5.7 (L) 02/08/2017 0437   ALBUMIN 3.2 (L) 02/08/2017 0437   AST 15 02/08/2017 0437   ALT 15 02/08/2017 0437   ALKPHOS 61 02/08/2017 0437   BILITOT 1.0 02/08/2017 0437   GFRNONAA >60 02/08/2017 0437   GFRAA >60 02/08/2017 0437   Lipase     Component Value Date/Time   LIPASE 22 02/07/2017 0846       Studies/Results: Ct Abdomen Pelvis W Contrast  Result Date: 02/07/2017 CLINICAL DATA:  Vomiting, lower abdominal pain. Recent surgery for intussusception. EXAM: CT ABDOMEN AND PELVIS WITH CONTRAST TECHNIQUE: Multidetector CT imaging of the abdomen and pelvis was performed using the standard protocol following bolus administration of intravenous contrast. CONTRAST:  195mL ISOVUE-300 IOPAMIDOL (ISOVUE-300) INJECTION 61% COMPARISON:  01/27/2017 FINDINGS: Lower chest: Lung bases are clear. No effusions. Heart is normal size. Hepatobiliary: Cysts scattered throughout the liver. Prior cholecystectomy. Pancreas: No focal abnormality or ductal dilatation. Spleen: No focal abnormality.  Normal size. Adrenals/Urinary Tract: No adrenal abnormality. No focal renal abnormality. No stones or hydronephrosis. Urinary bladder is unremarkable. Stomach/Bowel: Postoperative changes within the stomach. Mildly prominent proximal and mid small bowel loops. Distal small bowel loops are decompressed. Is difficult to completely exclude low grade partial small bowel obstruction. This could reflect  mild ileus. Small amount amount of interloop fluid and edema noted within the small bowel mesentery in the left abdomen. No visible colonic abnormality. Vascular/Lymphatic: Aortic and iliac calcifications. No aneurysm or adenopathy. Reproductive: Prior hysterectomy.  No adnexal masses. Other: Trace free fluid in the pelvis. Musculoskeletal: No acute bony abnormality. IMPRESSION: Mildly prominent proximal and mid  small bowel loops with decompressed distal small bowel. This could reflect focal ileus although low grade small bowel obstruction cannot be excluded. Small amount of interloop free fluid and edema within the small bowel mesentery. Trace free fluid in the pelvis. Hepatic cysts, stable. Aortoiliac atherosclerosis. Electronically Signed   By: Rolm Baptise [name]   On: 02/07/2017 10:47     Assessment/Plan SBO vs. Ileus S/P LOA 8/15 by Dr. Hoxworth/ Intussusception   - CT 8/23: Mildly prominent proximal and mid small bowel loops with decompressed distal small bowel. This could reflect focal ileus although low grade small bowel obstruction cannot be excluded. Small amount of interloop free fluid and edema within the small bowel mesentery - WBC 8.0, afebrile - No indications for acute surgical intervention - No NGT - may need if patient starts vomiting - check abdominal films in AM and repeat labs - OOB and ambulate patient - NPO, IVF  FEN - NPO, IVF  VTE - SCDs, lovenox ID - no current abx  Plan: Keep NPO.   Encourage ambulation. Recheck films and labs in AM.  LOS: 1 day    Brigid Re , [facility] Surgery 02/08/2017, 11:42 AM Pager: [phone] Consults: [phone]  Agree with above. The patient has wanted to hold off on the NGT - which I think is okay for now.  She knows that if she vomits, will place NGT. She has KUB ordered for the AM. No localized tenderness and WBC now normal ... But not improving.  Husband and daughter, Judeen Hammans, at bedside.  Alphonsa Overall, MD, [facility] Surgery Pager: [phone] Office phone:  [phone]

## 2017-02-08 NOTE — Progress Notes (Signed)
PROGRESS NOTE  [name]  SAY:[id number] DOB: 09-26-1945 DOA: 02/07/2017 PCP: Cassandria Anger, MD  Brief Narrative:   [name] is an 71 y.o. female with recent admission for small bowel obstruction secondary to jejunojejunal intussusception who was discharged 3 days prior to admission returns for recurrent abdominal pain nausea and vomiting, and decreased flatus.  CT demonstrated ileus vs. Low grade SBO.     Assessment & Plan:   Active Problems:   Adjustment disorder with mixed anxiety and depressed mood   Essential hypertension   GERD   Restless legs syndrome (RLS)   SBO (small bowel obstruction) (HCC)   Ventricular trigeminy  SBO Versus ILEUS -  NG tube -  Continue IVF -  General surgery consult  EPIGASTRIC PAIN -  Continue protonix, increase to BID  TRIGEMINY Patient with a fair amount of ectopy and long episodes of trigeminy. Patient states she does have a history of ectopy with PVCs since she was 60. Patient is asymptomatic and denies chest pain or palpitations or dizziness. I wonder if some of this may be because she has been off her verapamil. Will place patient on telemetry and monitor closely. Potassium is within normal limits, magnesium is pending.  HTN, blood pressures stable to mildly elevated Verapamil 240 daily is being held as patient is nothing by mouth.  DEPRESSION Venlafaxine, Xanax, trazodone and bupropion are being held as patient is nothing by mouth. - continue IV lorazepam for management of anxiety and/or withdrawal.  HL Lovastatin being held   DVT prophylaxis: Lovenox. Code Status: Full code. Family Communication: Patient with friend at bedside Disposition Plan: Home in a few days  Consultants:   General surgery  Procedures:  none  Antimicrobials:  Anti-infectives    None       Subjective: Ongoing nausea with heaves overnight, no vomiting.  Abdomen feels very distended and bloated and is TTP, particulary in  the left upper and left lower quadrants  Objective: Vitals:   02/07/17 1239 02/07/17 1608 02/07/17 2216 02/08/17 0648  BP: (!) 116/55 138/71 (!) 152/57 (!) 135/55  Pulse: 81 96 86 77  Resp: 16 18 18 16   Temp:  98 F (36.7 C) 98 F (36.7 C) 98 F (36.7 C)  TempSrc:  Oral Oral Oral  SpO2: 95% 92% 91% 96%  Weight:  91.2 kg (201 lb 1 oz)    Height:  5' 0.5" (1.537 m)      Intake/Output Summary (Last 24 hours) at 02/08/17 1804 Last data filed at 02/08/17 0600  Gross per 24 hour  Intake             1200 ml  Output                0 ml  Net             1200 ml   Filed Weights   02/07/17 1608  Weight: 91.2 kg (201 lb 1 oz)    Examination:  General exam:  Adult female.  No acute distress.  HEENT:  NCAT, MMM Respiratory system: Clear to auscultation bilaterally Cardiovascular system: Regular rate and rhythm with frequent irregular beats/trigeminy, normal S1/S2. No murmurs, rubs, gallops or clicks.  Warm extremities Gastrointestinal system:  Hypoactive BS, soft, moderately distended and TTP diffusely, but particularly in the left upper and lower quadrants.   MSK:  Normal tone and bulk, no lower extremity edema Neuro:  Grossly intact    Data Reviewed: I have personally reviewed following  labs and imaging studies  CBC:  Recent Labs Lab 02/02/17 0535 02/03/17 0516 02/07/17 0846 02/08/17 0437  WBC 6.2 5.9 12.2* 8.0  HGB 11.3* 11.3* 13.3 11.7*  HCT 35.4* 35.4* 41.4 37.2  MCV 91.9 91.2 91.4 93.0  PLT 204 211 283 283   Basic Metabolic Panel:  Recent Labs Lab 02/02/17 0535 02/03/17 0516 02/07/17 0846 02/07/17 1540 02/08/17 0437  NA 142 144 141  --  142  K 3.4* 3.8 4.2  --  4.1  CL 106 107 105  --  111  CO2 30 32 26  --  26  GLUCOSE 100* 93 119*  --  113*  BUN 9 7 13   --  10  CREATININE 0.77 0.71 0.73  --  0.72  CALCIUM 8.7* 8.8* 9.3  --  8.5*  MG  --   --   --  2.0  --    GFR: Estimated Creatinine Clearance: 65.7 mL/min (by C-G formula based on SCr of 0.72  mg/dL). Liver Function Tests:  Recent Labs Lab 02/07/17 0846 02/08/17 0437  AST 19 15  ALT 19 15  ALKPHOS 75 61  BILITOT 1.1 1.0  PROT 7.1 5.7*  ALBUMIN 4.2 3.2*    Recent Labs Lab 02/07/17 0846  LIPASE 22   No results for input(s): AMMONIA in the last 168 hours. Coagulation Profile: No results for input(s): INR, PROTIME in the last 168 hours. Cardiac Enzymes: No results for input(s): CKTOTAL, CKMB, CKMBINDEX, TROPONINI in the last 168 hours. BNP (last 3 results) No results for input(s): PROBNP in the last 8760 hours. HbA1C: No results for input(s): HGBA1C in the last 72 hours. CBG:  Recent Labs Lab 02/02/17 0753 02/03/17 0724  GLUCAP 78 92   Lipid Profile: No results for input(s): CHOL, HDL, LDLCALC, TRIG, CHOLHDL, LDLDIRECT in the last 72 hours. Thyroid Function Tests: No results for input(s): TSH, T4TOTAL, FREET4, T3FREE, THYROIDAB in the last 72 hours. Anemia Panel: No results for input(s): VITAMINB12, FOLATE, FERRITIN, TIBC, IRON, RETICCTPCT in the last 72 hours. Urine analysis:    Component Value Date/Time   COLORURINE YELLOW 02/07/2017 [place] 02/07/2017 0847   LABSPEC 1.026 02/07/2017 0847   PHURINE 6.0 02/07/2017 0847   GLUCOSEU NEGATIVE 02/07/2017 0847   GLUCOSEU NEGATIVE 01/16/2017 1011   HGBUR NEGATIVE 02/07/2017 0847   BILIRUBINUR NEGATIVE 02/07/2017 0847   BILIRUBINUR neg 01/26/2017 1031   KETONESUR 20 (A) 02/07/2017 0847   PROTEINUR 30 (A) 02/07/2017 0847   UROBILINOGEN 0.2 01/26/2017 1031   UROBILINOGEN 0.2 01/16/2017 1011   NITRITE NEGATIVE 02/07/2017 0847   LEUKOCYTESUR TRACE (A) 02/07/2017 0847   Sepsis Labs: @LABRCNTIP (procalcitonin:4,lacticidven:4)  ) Recent Results (from the past 240 hour(s))  Surgical pcr screen     Status: None   Collection Time: 01/30/17  8:55 AM  Result Value Ref Range Status   MRSA, PCR NEGATIVE NEGATIVE Final   Staphylococcus aureus NEGATIVE NEGATIVE Final    Comment:        The  Xpert SA Assay (FDA approved for NASAL specimens in patients over 68 years of age), is one component of a comprehensive surveillance program.  Test performance has been validated by [facility] for patients greater than or equal to 47 year old. It is not intended to diagnose infection nor to guide or monitor treatment.       Radiology Studies: Ct Abdomen Pelvis W Contrast  Result Date: 02/07/2017 CLINICAL DATA:  Vomiting, lower abdominal pain. Recent surgery for intussusception. EXAM: CT  ABDOMEN AND PELVIS WITH CONTRAST TECHNIQUE: Multidetector CT imaging of the abdomen and pelvis was performed using the standard protocol following bolus administration of intravenous contrast. CONTRAST:  167mL ISOVUE-300 IOPAMIDOL (ISOVUE-300) INJECTION 61% COMPARISON:  01/27/2017 FINDINGS: Lower chest: Lung bases are clear. No effusions. Heart is normal size. Hepatobiliary: Cysts scattered throughout the liver. Prior cholecystectomy. Pancreas: No focal abnormality or ductal dilatation. Spleen: No focal abnormality.  Normal size. Adrenals/Urinary Tract: No adrenal abnormality. No focal renal abnormality. No stones or hydronephrosis. Urinary bladder is unremarkable. Stomach/Bowel: Postoperative changes within the stomach. Mildly prominent proximal and mid small bowel loops. Distal small bowel loops are decompressed. Is difficult to completely exclude low grade partial small bowel obstruction. This could reflect mild ileus. Small amount amount of interloop fluid and edema noted within the small bowel mesentery in the left abdomen. No visible colonic abnormality. Vascular/Lymphatic: Aortic and iliac calcifications. No aneurysm or adenopathy. Reproductive: Prior hysterectomy.  No adnexal masses. Other: Trace free fluid in the pelvis. Musculoskeletal: No acute bony abnormality. IMPRESSION: Mildly prominent proximal and mid small bowel loops with decompressed distal small bowel. This could reflect focal ileus although  low grade small bowel obstruction cannot be excluded. Small amount of interloop free fluid and edema within the small bowel mesentery. Trace free fluid in the pelvis. Hepatic cysts, stable. Aortoiliac atherosclerosis. Electronically Signed   By: Rolm Baptise [name]   On: 02/07/2017 10:47     Scheduled Meds: . enoxaparin (LOVENOX) injection  40 mg Subcutaneous Q24H  . fluticasone  2 spray Each Nare Daily  . pantoprazole (PROTONIX) IV  40 mg Intravenous Q12H   Continuous Infusions: . dextrose 5% lactated ringers with KCl 20 mEq/L 100 mL/hr at 02/08/17 0239     LOS: 1 day    Time spent: 30 min    Janece Canterbury, MD [facility] Pager [phone]  If 7PM-7AM, please contact night-coverage [email or web address] Password [facility] 02/08/2017, 6:04 PM

## 2017-02-09 ENCOUNTER — Inpatient Hospital Stay (HOSPITAL_COMMUNITY): Payer: Medicare Other

## 2017-02-09 LAB — CBC
HEMATOCRIT: 41.9 % (ref 36.0–46.0)
HEMOGLOBIN: 13.1 g/dL (ref 12.0–15.0)
MCH: 29 pg (ref 26.0–34.0)
MCHC: 31.3 g/dL (ref 30.0–36.0)
MCV: 92.9 fL (ref 78.0–100.0)
Platelets: 205 10*3/uL (ref 150–400)
RBC: 4.51 MIL/uL (ref 3.87–5.11)
RDW: 14.2 % (ref 11.5–15.5)
WBC: 6.7 10*3/uL (ref 4.0–10.5)

## 2017-02-09 LAB — BASIC METABOLIC PANEL
ANION GAP: 7 (ref 5–15)
BUN: 8 mg/dL (ref 6–20)
CALCIUM: 8.5 mg/dL — AB (ref 8.9–10.3)
CHLORIDE: 105 mmol/L (ref 101–111)
CO2: 27 mmol/L (ref 22–32)
Creatinine, Ser: 0.7 mg/dL (ref 0.44–1.00)
GFR calc non Af Amer: >60 mL/min (ref >60)
Glucose, Bld: 109 mg/dL — ABNORMAL HIGH (ref 65–99)
Potassium: 4.1 mmol/L (ref 3.5–5.1)
Sodium: 139 mmol/L (ref 135–145)

## 2017-02-09 MED ORDER — PROMETHAZINE HCL 25 MG/ML IJ SOLN
12.5000 mg | Freq: Four times a day (QID) | INTRAMUSCULAR | Status: DC | PRN
  Administered 2017-02-09 – 2017-02-10 (×2): 12.5 mg via INTRAVENOUS
  Filled 2017-02-09 (×3): qty 1

## 2017-02-09 MED ORDER — METOPROLOL TARTRATE 5 MG/5ML IV SOLN
2.5000 mg | Freq: Four times a day (QID) | INTRAVENOUS | Status: DC
Start: 2017-02-09 — End: 2017-02-11
  Administered 2017-02-09 – 2017-02-11 (×8): 2.5 mg via INTRAVENOUS
  Filled 2017-02-09 (×8): qty 5

## 2017-02-09 NOTE — Progress Notes (Signed)
Patient ID: [name], female   DOB: 01-03-1946, 71 y.o.   MRN: [id number] [facility] Surgery Progress Note:   * No surgery found *  Subjective: Mental status is clear.  Not in pain Objective: Vital signs in last 24 hours: Temp:  [97.9 F (36.6 C)] 97.9 F (36.6 C) (08/25 0550) Pulse Rate:  [79-92] 79 (08/25 0550) Resp:  [16-18] 16 (08/25 0550) BP: (140-142)/(57-73) 142/73 (08/25 0550) SpO2:  [94 %-95 %] 95 % (08/25 0550) Weight:  [93.2 kg (205 lb 8 oz)-208.6 kg (459 lb 14.1 oz)] 93.2 kg (205 lb 8 oz) (08/25 0646)  Intake/Output from previous day: 08/24 0701 - 08/25 0700 In: 2500 [I.V.:2500] Out: -  Intake/Output this shift: No intake/output data recorded.  Physical Exam: Work of breathing is normal.  Abdomen soft and nontender.  No flatus yet.    Lab Results:  Results for orders placed or performed during the hospital encounter of 02/07/17 (from the past 48 hour(s))  Lipase, blood     Status: None   Collection Time: 02/07/17  8:46 AM  Result Value Ref Range   Lipase 22 11 - 51 U/L  Comprehensive metabolic panel     Status: Abnormal   Collection Time: 02/07/17  8:46 AM  Result Value Ref Range   Sodium 141 135 - 145 mmol/L   Potassium 4.2 3.5 - 5.1 mmol/L   Chloride 105 101 - 111 mmol/L   CO2 26 22 - 32 mmol/L   Glucose, Bld 119 (H) 65 - 99 mg/dL   BUN 13 6 - 20 mg/dL   Creatinine, Ser 0.73 0.44 - 1.00 mg/dL   Calcium 9.3 8.9 - 10.3 mg/dL   Total Protein 7.1 6.5 - 8.1 g/dL   Albumin 4.2 3.5 - 5.0 g/dL   AST 19 15 - 41 U/L   ALT 19 14 - 54 U/L   Alkaline Phosphatase 75 38 - 126 U/L   Total Bilirubin 1.1 0.3 - 1.2 mg/dL   GFR calc non Af Amer >60 >60 mL/min   GFR calc Af Amer >60 >60 mL/min    Comment: (NOTE) The eGFR has been calculated using the CKD EPI equation. This calculation has not been validated in all clinical situations. eGFR's persistently <60 mL/min signify possible Chronic Kidney Disease.    Anion gap 10 5 - 15  CBC     Status: Abnormal   Collection Time: 02/07/17  8:46 AM  Result Value Ref Range   WBC 12.2 (H) 4.0 - 10.5 K/uL   RBC 4.53 3.87 - 5.11 MIL/uL   Hemoglobin 13.3 12.0 - 15.0 g/dL   HCT 41.4 36.0 - 46.0 %   MCV 91.4 78.0 - 100.0 fL   MCH 29.4 26.0 - 34.0 pg   MCHC 32.1 30.0 - 36.0 g/dL   RDW 14.1 11.5 - 15.5 %   Platelets 283 150 - 400 K/uL  Urinalysis, Routine w reflex microscopic     Status: Abnormal   Collection Time: 02/07/17  8:47 AM  Result Value Ref Range   Color, Urine YELLOW YELLOW   APPearance CLEAR CLEAR   Specific Gravity, Urine 1.026 1.005 - 1.030   pH 6.0 5.0 - 8.0   Glucose, UA NEGATIVE NEGATIVE mg/dL   Hgb urine dipstick NEGATIVE NEGATIVE   Bilirubin Urine NEGATIVE NEGATIVE   Ketones, ur 20 (A) NEGATIVE mg/dL   Protein, ur 30 (A) NEGATIVE mg/dL   Nitrite NEGATIVE NEGATIVE   Leukocytes, UA TRACE (A) NEGATIVE   RBC / HPF  0-5 0 - 5 RBC/hpf   WBC, UA 6-30 0 - 5 WBC/hpf   Bacteria, UA NONE SEEN NONE SEEN   Squamous Epithelial / LPF 0-5 (A) NONE SEEN   Mucus PRESENT   I-Stat CG4 Lactic Acid, ED     Status: None   Collection Time: 02/07/17  8:50 AM  Result Value Ref Range   Lactic Acid, Venous 1.18 0.5 - 1.9 mmol/L  Magnesium     Status: None   Collection Time: 02/07/17  3:40 PM  Result Value Ref Range   Magnesium 2.0 1.7 - 2.4 mg/dL  Comprehensive metabolic panel     Status: Abnormal   Collection Time: 02/08/17  4:37 AM  Result Value Ref Range   Sodium 142 135 - 145 mmol/L   Potassium 4.1 3.5 - 5.1 mmol/L   Chloride 111 101 - 111 mmol/L   CO2 26 22 - 32 mmol/L   Glucose, Bld 113 (H) 65 - 99 mg/dL   BUN 10 6 - 20 mg/dL   Creatinine, Ser 0.72 0.44 - 1.00 mg/dL   Calcium 8.5 (L) 8.9 - 10.3 mg/dL   Total Protein 5.7 (L) 6.5 - 8.1 g/dL   Albumin 3.2 (L) 3.5 - 5.0 g/dL   AST 15 15 - 41 U/L   ALT 15 14 - 54 U/L   Alkaline Phosphatase 61 38 - 126 U/L   Total Bilirubin 1.0 0.3 - 1.2 mg/dL   GFR calc non Af Amer >60 >60 mL/min   GFR calc Af Amer >60 >60 mL/min    Comment: (NOTE) The  eGFR has been calculated using the CKD EPI equation. This calculation has not been validated in all clinical situations. eGFR's persistently <60 mL/min signify possible Chronic Kidney Disease.    Anion gap 5 5 - 15  CBC     Status: Abnormal   Collection Time: 02/08/17  4:37 AM  Result Value Ref Range   WBC 8.0 4.0 - 10.5 K/uL   RBC 4.00 3.87 - 5.11 MIL/uL   Hemoglobin 11.7 (L) 12.0 - 15.0 g/dL   HCT 37.2 36.0 - 46.0 %   MCV 93.0 78.0 - 100.0 fL   MCH 29.3 26.0 - 34.0 pg   MCHC 31.5 30.0 - 36.0 g/dL   RDW 14.4 11.5 - 15.5 %   Platelets 236 150 - 400 K/uL  CBC     Status: None   Collection Time: 02/09/17  3:47 AM  Result Value Ref Range   WBC 6.7 4.0 - 10.5 K/uL   RBC 4.51 3.87 - 5.11 MIL/uL   Hemoglobin 13.1 12.0 - 15.0 g/dL   HCT 41.9 36.0 - 46.0 %   MCV 92.9 78.0 - 100.0 fL   MCH 29.0 26.0 - 34.0 pg   MCHC 31.3 30.0 - 36.0 g/dL   RDW 14.2 11.5 - 15.5 %   Platelets 205 150 - 400 K/uL  Basic metabolic panel     Status: Abnormal   Collection Time: 02/09/17  3:47 AM  Result Value Ref Range   Sodium 139 135 - 145 mmol/L   Potassium 4.1 3.5 - 5.1 mmol/L   Chloride 105 101 - 111 mmol/L   CO2 27 22 - 32 mmol/L   Glucose, Bld 109 (H) 65 - 99 mg/dL   BUN 8 6 - 20 mg/dL   Creatinine, Ser 0.70 0.44 - 1.00 mg/dL   Calcium 8.5 (L) 8.9 - 10.3 mg/dL   GFR calc non Af Amer >60 >60 mL/min   GFR calc  Af Amer >60 >60 mL/min    Comment: (NOTE) The eGFR has been calculated using the CKD EPI equation. This calculation has not been validated in all clinical situations. eGFR's persistently <60 mL/min signify possible Chronic Kidney Disease.    Anion gap 7 5 - 15    Radiology/Results: Ct Abdomen Pelvis W Contrast  Result Date: 02/07/2017 CLINICAL DATA:  Vomiting, lower abdominal pain. Recent surgery for intussusception. EXAM: CT ABDOMEN AND PELVIS WITH CONTRAST TECHNIQUE: Multidetector CT imaging of the abdomen and pelvis was performed using the standard protocol following bolus  administration of intravenous contrast. CONTRAST:  12m ISOVUE-300 IOPAMIDOL (ISOVUE-300) INJECTION 61% COMPARISON:  01/27/2017 FINDINGS: Lower chest: Lung bases are clear. No effusions. Heart is normal size. Hepatobiliary: Cysts scattered throughout the liver. Prior cholecystectomy. Pancreas: No focal abnormality or ductal dilatation. Spleen: No focal abnormality.  Normal size. Adrenals/Urinary Tract: No adrenal abnormality. No focal renal abnormality. No stones or hydronephrosis. Urinary bladder is unremarkable. Stomach/Bowel: Postoperative changes within the stomach. Mildly prominent proximal and mid small bowel loops. Distal small bowel loops are decompressed. Is difficult to completely exclude low grade partial small bowel obstruction. This could reflect mild ileus. Small amount amount of interloop fluid and edema noted within the small bowel mesentery in the left abdomen. No visible colonic abnormality. Vascular/Lymphatic: Aortic and iliac calcifications. No aneurysm or adenopathy. Reproductive: Prior hysterectomy.  No adnexal masses. Other: Trace free fluid in the pelvis. Musculoskeletal: No acute bony abnormality. IMPRESSION: Mildly prominent proximal and mid small bowel loops with decompressed distal small bowel. This could reflect focal ileus although low grade small bowel obstruction cannot be excluded. Small amount of interloop free fluid and edema within the small bowel mesentery. Trace free fluid in the pelvis. Hepatic cysts, stable. Aortoiliac atherosclerosis. Electronically Signed   By: KRolm BaptiseM.D.   On: 02/07/2017 10:47    Anti-infectives: Anti-infectives    None      Assessment/Plan: Problem List: Patient Active Problem List   Diagnosis Date Noted  . SBO [place] obstruction) ([place] 02/07/2017  . 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  . Abdominal pain, LLQ 01/26/2017  . Intussusception ([place] 01/26/2017  . Hearing loss 07/13/2016  . Influenza 09/16/2015  . Headache 12/24/2014   . Neoplasm of uncertain behavior of skin 12/24/2014  . 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 Dysuria 04/07/2014  . Cough 08/25/2013  . Well adult exam 06/16/2013  . Sinusitis, chronic 06/09/2013  . URI, acute 06/09/2012  . Restless legs syndrome (RLS) 01/26/2011  . FINGER PAIN 07/28/2010  . PALPITATIONS 10/18/2008  . VITAMIN D DEFICIENCY 10/09/2008  . DYSLIPIDEMIA 10/09/2008  . Morbid obesity ([place] 10/09/2008  . Essential hypertension 10/09/2008  . SYNCOPE 09/27/2008  . UNSPECIFIED EUSTACHIAN TUBE DISORDER 04/09/2008  . Anxiety state 10/07/2007  . INSOMNIA, PERSISTENT 07/08/2007  . Adjustment disorder with mixed anxiety and depressed mood 07/08/2007  . RHINITIS 07/08/2007  . B12 deficiency 04/07/2007  . ANEMIA, DUE TO BLOOD LOSS 04/07/2007  . OSTEOARTHRITIS 04/07/2007  . GERD 04/04/2007  . Diverticulosis of colon 04/04/2007    Xray of abdomen shows contrast in the colon and not much bowel gas distention.  Appears to be resolving ileus.  Will await flatus before starting clear liquids.   * No surgery found *    LOS: 2 days   Matt B. MHassell Done MD, FArkansas Children'S HospitalSurgery, P.A. [phone] [phone] 02/09/2017 8:37 AM

## 2017-02-09 NOTE — Progress Notes (Signed)
PROGRESS NOTE  [name]  ZOX:[id number] DOB: 11-28-1945 DOA: 02/07/2017 PCP: Tresa Garter, MD  Brief Narrative:   [name] is an 71 y.o. female with recent admission for small bowel obstruction secondary to jejunojejunal intussusception who was discharged 3 days prior to admission returns for recurrent abdominal pain nausea and vomiting, and decreased flatus.  CT demonstrated ileus vs. Low grade SBO.  Vomiting today. NG placed for comfort.     Assessment & Plan:   Active Problems:   Adjustment disorder with mixed anxiety and depressed mood   Essential hypertension   GERD   Restless legs syndrome (RLS)   SBO (small bowel obstruction) (HCC)   Ventricular trigeminy  Suspect post-operative ileus.  No flatus or BM.  Still having hypoactive BS -  NG tube placed -  Continue IVF -  Keep electrolytes wnl:  Magnesium and potassium wnl -  General surgery assistance appreciated  EPIGASTRIC PAIN -  Continue protonix, increase to BID  TRIGEMINY with [name] runs of SVT, fair amount of ectopy and long episodes of trigeminy.  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 -  asymptomatic  -  Keep electrolytes wnl -  Start scheduled metoprolol IV while NPO  HTN, blood pressures stable to mildly elevated -  Starting metoprolol while NPO -  Resume verapamil 240 daily once diet advanced  DEPRESSION Venlafaxine, Xanax, trazodone and bupropion are being held as patient is nothing by mouth. - continue IV lorazepam for management of anxiety and/or withdrawal.  HL Lovastatin being held   DVT prophylaxis: Lovenox. Code Status: Full code. Family Communication: Patient with two sisters at beside  Disposition Plan: Home in a few days  Consultants:   General surgery  Procedures:  NG placed on 8/25  Antimicrobials:  Anti-infectives    None       Subjective: Vomited today.  Still having nausea and abdominal bloating/discomfort despite walking.  No flatus or BM    Objective: Vitals:   02/08/17 2005 02/09/17 0550 02/09/17 0646 02/09/17 1256  BP: (!) 140/57 (!) 142/73  (!) 145/70  Pulse: 92 79  85  Resp: 18 16  16   Temp: 97.9 F (36.6 C) 97.9 F (36.6 C)  98.1 F (36.7 C)  TempSrc: Oral Oral  Oral  SpO2: 94% 95%  97%  Weight:  (!) 208.6 kg (459 lb 14.1 oz) 93.2 kg (205 lb 8 oz)   Height:        Intake/Output Summary (Last 24 hours) at 02/09/17 1700 Last data filed at 02/09/17 0700  Gross per 24 hour  Intake             2500 ml  Output                0 ml  Net             2500 ml   Filed Weights   02/07/17 1608 02/09/17 0550 02/09/17 0646  Weight: 91.2 kg (201 lb 1 oz) (!) 208.6 kg (459 lb 14.1 oz) 93.2 kg (205 lb 8 oz)    Examination:  General exam:  Adult female.  No acute distress.  HEENT:  NCAT, MMM Respiratory system: Clear to auscultation bilaterally Cardiovascular system: Regular rate and rhythm with frequent irregular beats/trigeminy, normal S1/S2. No murmurs, rubs, gallops or clicks.  [name] extremities Gastrointestinal system:  Hypoactive BS, soft, moderately distended and TTP diffusely although now more tender in the right quadrants and periumbilical area  MSK:  Normal  tone and bulk, no lower extremity edema Neuro:  Grossly intact    Data Reviewed: I have personally reviewed following labs and imaging studies  CBC:  Recent Labs Lab 02/03/17 0516 02/07/17 0846 02/08/17 0437 02/09/17 0347  WBC 5.9 12.2* 8.0 6.7  HGB 11.3* 13.3 11.7* 13.1  HCT 35.4* 41.4 37.2 41.9  MCV 91.2 91.4 93.0 92.9  PLT 211 283 236 205   Basic Metabolic Panel:  Recent Labs Lab 02/03/17 0516 02/07/17 0846 02/07/17 1540 02/08/17 0437 02/09/17 0347  NA 144 141  --  142 139  K 3.8 4.2  --  4.1 4.1  CL 107 105  --  111 105  CO2 32 26  --  26 27  GLUCOSE 93 119*  --  113* 109*  BUN 7 13  --  10 8  CREATININE 0.71 0.73  --  0.72 0.70  CALCIUM 8.8* 9.3  --  8.5* 8.5*  MG  --   --  2.0  --   --    GFR: Estimated Creatinine  Clearance: 66.5 mL/min (by C-G formula based on SCr of 0.7 mg/dL). Liver Function Tests:  Recent Labs Lab 02/07/17 0846 02/08/17 0437  AST 19 15  ALT 19 15  ALKPHOS 75 61  BILITOT 1.1 1.0  PROT 7.1 5.7*  ALBUMIN 4.2 3.2*    Recent Labs Lab 02/07/17 0846  LIPASE 22   No results for input(s): AMMONIA in the last 168 hours. Coagulation Profile: No results for input(s): INR, PROTIME in the last 168 hours. Cardiac Enzymes: No results for input(s): CKTOTAL, CKMB, CKMBINDEX, TROPONINI in the last 168 hours. BNP (last 3 results) No results for input(s): PROBNP in the last 8760 hours. HbA1C: No results for input(s): HGBA1C in the last 72 hours. CBG:  Recent Labs Lab 02/03/17 0724  GLUCAP 92   Lipid Profile: No results for input(s): CHOL, HDL, LDLCALC, TRIG, CHOLHDL, LDLDIRECT in the last 72 hours. Thyroid Function Tests: No results for input(s): TSH, T4TOTAL, FREET4, T3FREE, THYROIDAB in the last 72 hours. Anemia Panel: No results for input(s): VITAMINB12, FOLATE, FERRITIN, TIBC, IRON, RETICCTPCT in the last 72 hours. Urine analysis:    Component Value Date/Time   COLORURINE YELLOW 02/07/2017 0847   APPEARANCEUR CLEAR 02/07/2017 0847   LABSPEC 1.026 02/07/2017 0847   PHURINE 6.0 02/07/2017 0847   GLUCOSEU NEGATIVE 02/07/2017 0847   GLUCOSEU NEGATIVE 01/16/2017 1011   HGBUR NEGATIVE 02/07/2017 0847   BILIRUBINUR NEGATIVE 02/07/2017 0847   BILIRUBINUR neg 01/26/2017 1031   KETONESUR 20 (A) 02/07/2017 0847   PROTEINUR 30 (A) 02/07/2017 0847   UROBILINOGEN 0.2 01/26/2017 1031   UROBILINOGEN 0.2 01/16/2017 1011   NITRITE NEGATIVE 02/07/2017 0847   LEUKOCYTESUR TRACE (A) 02/07/2017 0847   Sepsis Labs: @LABRCNTIP (procalcitonin:4,lacticidven:4)  ) No results found for this or any previous visit (from the past 240 hour(s)).    Radiology Studies: Dg Abd 1 View  Result Date: 02/09/2017 CLINICAL DATA:  Status post NG tube placement EXAM: ABDOMEN - 1 VIEW COMPARISON:   02/09/2017 at 0453 hours FINDINGS: Enteric tube likely terminates in the proximal duodenum. Postsurgical changes involving the stomach.  Cholecystectomy clips. IMPRESSION: Enteric tube likely terminates in the proximal duodenum. Electronically Signed   By: Charline Bills [name]   On: 02/09/2017 15:24   Dg Abd Portable 2v  Result Date: 02/09/2017 CLINICAL DATA:  SBO (small bowel obstruction) Hx gastroplasty, cholecystectomy, appendectomy, hysterectomy. EXAM: PORTABLE ABDOMEN - 2 VIEW COMPARISON:  CT 02/07/2017 FINDINGS: Anastomotic staple line in the  epigastrium. Cholecystectomy clips. Visualized lung bases clear. No free air on decubitus. A few distended small bowel loops in the mid and lower abdomen, with scattered fluid levels on the decubitus radiograph. Residual contrast material in the nondilated colon. Regional bones unremarkable. IMPRESSION: 1. Persistent fluid levels in distended small bowel loops suggesting residual distal SBO or ileus. 2. No free air. Electronically Signed   By: Corlis Leak [name]   On: 02/09/2017 11:26     Scheduled Meds: . enoxaparin (LOVENOX) injection  40 mg Subcutaneous Q24H  . fluticasone  2 spray Each Nare Daily  . pantoprazole (PROTONIX) IV  40 mg Intravenous Q12H   Continuous Infusions: . dextrose 5% lactated ringers with KCl 20 mEq/L 100 mL/hr at 02/09/17 1527     LOS: 2 days    Time spent: 30 min    Renae Fickle, MD [facility] Pager [phone]  If 7PM-7AM, please contact night-coverage [email or web address] Password [email or web address] 02/09/2017, 5:00 PM

## 2017-02-10 ENCOUNTER — Inpatient Hospital Stay (HOSPITAL_COMMUNITY): Payer: Medicare Other

## 2017-02-10 MED ORDER — BISACODYL 10 MG RE SUPP
10.0000 mg | Freq: Once | RECTAL | Status: AC
  Administered 2017-02-11: 10 mg via RECTAL
  Filled 2017-02-10 (×2): qty 1

## 2017-02-10 NOTE — Progress Notes (Signed)
PROGRESS NOTE  RILLEY POULTER  BPZ:[id number] DOB: March 07, 1946 DOA: 02/07/2017 PCP: Cassandria Anger, MD  Brief Narrative:   KYMBERLY BLOMBERG is an 71 y.o. female with recent admission for small bowel obstruction secondary to jejunojejunal intussusception who was discharged 3 days prior to admission returns for recurrent abdominal pain nausea and vomiting, and decreased flatus.  CT demonstrated ileus vs. Low grade SBO.  NG placed for comfort on 8/25 with copious bilious and borderline feculent output   Assessment & Plan:   Active Problems:   Adjustment disorder with mixed anxiety and depressed mood   Essential hypertension   GERD   Restless legs syndrome (RLS)   SBO (small bowel obstruction) (HCC)   Ventricular trigeminy  Suspect post-operative ileus.  No flatus or BM.  Persistently hypoactive BS -  NG tube placed -  Continue IVF -  Keep electrolytes wnl:  Magnesium and potassium wnl -  General surgery assistance appreciated -  KUB:  Contrast in the colon, improved bowel distension compared to prior  EPIGASTRIC PAIN -  Continue protonix BID  BIGEMINY, fewer runs of SVT since starting metoprolol.  History of ectopy with PVCs since she was 67.  -  asymptomatic  -  Keep electrolytes wnl -  Continue scheduled metoprolol IV while NPO  HTN, blood pressures stable to mildly elevated -  Continue metoprolol while NPO -  Resume verapamil 240 daily once diet advanced  DEPRESSION Venlafaxine, Xanax, trazodone and bupropion are being held as patient is nothing by mouth. - continue IV lorazepam for management of anxiety and/or withdrawal.  HL Lovastatin being held   DVT prophylaxis: Lovenox. Code Status: Full code. Family Communication: Patient alone with family friend at bedside Disposition Plan: Home in a few days  Consultants:   General surgery  Procedures:  NG placed on 8/25  Antimicrobials:  Anti-infectives    None       Subjective:  Abdominal bloating  has improved since having her NG placed, but she has a sore throat.  No flatus or BMs.    Objective: Vitals:   02/09/17 2215 02/09/17 2356 02/10/17 0501 02/10/17 1316  BP: 139/68 139/62 (!) 143/84 (!) 157/58  Pulse: 86 76 80 87  Resp: 15  16 17   Temp: 98.1 F (36.7 C)  98 F (36.7 C) 97.7 F (36.5 C)  TempSrc: Oral  Oral Oral  SpO2: 92%  96% 96%  Weight:   92.1 kg (203 lb 1.6 oz)   Height:        Intake/Output Summary (Last 24 hours) at 02/10/17 1658 Last data filed at 02/10/17 1317  Gross per 24 hour  Intake             2400 ml  Output              750 ml  Net             1650 ml   Filed Weights   02/09/17 0550 02/09/17 0646 02/10/17 0501  Weight: (!) 208.6 kg (459 lb 14.1 oz) 93.2 kg (205 lb 8 oz) 92.1 kg (203 lb 1.6 oz)    Examination:   General exam:  Adult female.  No acute distress.  HEENT:  NCAT, MMM Respiratory system: Clear to auscultation bilaterally Cardiovascular system: Regular rate frequent skipped beats/bigeminy, normal S1/S2. No murmurs, rubs, gallops or clicks.  Warm extremities Gastrointestinal system: Hypoactive bowel sounds, mildly distended, less tender to palpation diffusely compared to yesterday, no rebound or guarding.  Proximally  750 mL of bilious/semi-feculent material in her NG canister MSK:  Normal tone and bulk, no lower extremity edema Neuro:  Grossly intact  Data Reviewed: I have personally reviewed following labs and imaging studies  CBC:  Recent Labs Lab 02/07/17 0846 02/08/17 0437 02/09/17 0347  WBC 12.2* 8.0 6.7  HGB 13.3 11.7* 13.1  HCT 41.4 37.2 41.9  MCV 91.4 93.0 92.9  PLT 283 236 938   Basic Metabolic Panel:  Recent Labs Lab 02/07/17 0846 02/07/17 1540 02/08/17 0437 02/09/17 0347  NA 141  --  142 139  K 4.2  --  4.1 4.1  CL 105  --  111 105  CO2 26  --  26 27  GLUCOSE 119*  --  113* 109*  BUN 13  --  10 8  CREATININE 0.73  --  0.72 0.70  CALCIUM 9.3  --  8.5* 8.5*  MG  --  2.0  --   --    GFR: Estimated  Creatinine Clearance: 66.1 mL/min (by C-G formula based on SCr of 0.7 mg/dL). Liver Function Tests:  Recent Labs Lab 02/07/17 0846 02/08/17 0437  AST 19 15  ALT 19 15  ALKPHOS 75 61  BILITOT 1.1 1.0  PROT 7.1 5.7*  ALBUMIN 4.2 3.2*    Recent Labs Lab 02/07/17 0846  LIPASE 22   No results for input(s): AMMONIA in the last 168 hours. Coagulation Profile: No results for input(s): INR, PROTIME in the last 168 hours. Cardiac Enzymes: No results for input(s): CKTOTAL, CKMB, CKMBINDEX, TROPONINI in the last 168 hours. BNP (last 3 results) No results for input(s): PROBNP in the last 8760 hours. HbA1C: No results for input(s): HGBA1C in the last 72 hours. CBG: No results for input(s): GLUCAP in the last 168 hours. Lipid Profile: No results for input(s): CHOL, HDL, LDLCALC, TRIG, CHOLHDL, LDLDIRECT in the last 72 hours. Thyroid Function Tests: No results for input(s): TSH, T4TOTAL, FREET4, T3FREE, THYROIDAB in the last 72 hours. Anemia Panel: No results for input(s): VITAMINB12, FOLATE, FERRITIN, TIBC, IRON, RETICCTPCT in the last 72 hours. Urine analysis:    Component Value Date/Time   COLORURINE YELLOW 02/07/2017 0847   APPEARANCEUR CLEAR 02/07/2017 0847   LABSPEC 1.026 02/07/2017 0847   PHURINE 6.0 02/07/2017 0847   GLUCOSEU NEGATIVE 02/07/2017 0847   GLUCOSEU NEGATIVE 01/16/2017 [place] 02/07/2017 0847   BILIRUBINUR NEGATIVE 02/07/2017 0847   BILIRUBINUR neg 01/26/2017 1031   KETONESUR 20 (A) 02/07/2017 0847   PROTEINUR 30 (A) 02/07/2017 0847   UROBILINOGEN 0.2 01/26/2017 1031   UROBILINOGEN 0.2 01/16/2017 1011   NITRITE NEGATIVE 02/07/2017 0847   LEUKOCYTESUR TRACE (A) 02/07/2017 0847   Sepsis Labs: @LABRCNTIP (procalcitonin:4,lacticidven:4)  ) No results found for this or any previous visit (from the past 240 hour(s)).    Radiology Studies: Dg Abd 1 View  Result Date: 02/09/2017 CLINICAL DATA:  Status post NG tube placement EXAM: ABDOMEN - 1  VIEW COMPARISON:  02/09/2017 at 0453 hours FINDINGS: Enteric tube likely terminates in the proximal duodenum. Postsurgical changes involving the stomach.  Cholecystectomy clips. IMPRESSION: Enteric tube likely terminates in the proximal duodenum. Electronically Signed   By: Julian Hy [name]   On: 02/09/2017 15:24   Dg Abd Portable 1v  Result Date: 02/10/2017 CLINICAL DATA:  Continued surveillance of obstruction/ileus. EXAM: PORTABLE ABDOMEN - 1 VIEW COMPARISON:  02/09/2017. FINDINGS: Nasogastric tube may be coiled in the pylorus. There are no features suggestive of small bowel obstruction on today's portable supine radiograph.  Enteric contrast within the colon is redemonstrated, without significant transit. IMPRESSION: No features suggestive of small bowel distention are evident. Electronically Signed   By: Staci Righter [name]   On: 02/10/2017 08:40   Dg Abd Portable 2v  Result Date: 02/09/2017 CLINICAL DATA:  SBO (small bowel obstruction) Hx gastroplasty, cholecystectomy, appendectomy, hysterectomy. EXAM: PORTABLE ABDOMEN - 2 VIEW COMPARISON:  CT 02/07/2017 FINDINGS: Anastomotic staple line in the epigastrium. Cholecystectomy clips. Visualized lung bases clear. No free air on decubitus. A few distended small bowel loops in the mid and lower abdomen, with scattered fluid levels on the decubitus radiograph. Residual contrast material in the nondilated colon. Regional bones unremarkable. IMPRESSION: 1. Persistent fluid levels in distended small bowel loops suggesting residual distal SBO or ileus. 2. No free air. Electronically Signed   By: Lucrezia Europe [name]   On: 02/09/2017 11:26     Scheduled Meds: . bisacodyl  10 mg Rectal Once  . enoxaparin (LOVENOX) injection  40 mg Subcutaneous Q24H  . fluticasone  2 spray Each Nare Daily  . metoprolol tartrate  2.5 mg Intravenous Q6H  . pantoprazole (PROTONIX) IV  40 mg Intravenous Q12H   Continuous Infusions: . dextrose 5% lactated ringers with KCl 20  mEq/L 100 mL/hr at 02/09/17 2227     LOS: 3 days    Time spent: 30 min    Janece Canterbury, MD [facility] Pager [phone]  If 7PM-7AM, please contact night-coverage [email or web address] Password [facility] 02/10/2017, 4:58 PM

## 2017-02-10 NOTE — Progress Notes (Signed)
Patient ID: [name], female   DOB: 1945-07-24, 70 y.o.   MRN: [id number] [facility] Surgery Progress Note:   * No surgery found *  Subjective: Mental status is clear and alert.  Feeling better after NG placement.  Large volume return Objective: Vital signs in last 24 hours: Temp:  [98 F (36.7 C)-98.1 F (36.7 C)] 98 F (36.7 C) (08/26 0501) Pulse Rate:  [76-86] 80 (08/26 0501) Resp:  [15-16] 16 (08/26 0501) BP: (139-145)/(62-84) 143/84 (08/26 0501) SpO2:  [92 %-97 %] 96 % (08/26 0501) Weight:  [92.1 kg (203 lb 1.6 oz)] 92.1 kg (203 lb 1.6 oz) (08/26 0501)  Intake/Output from previous day: 08/25 0701 - 08/26 0700 In: 2400 [I.V.:2400] Out: 1750 [Emesis/NG output:1750] Intake/Output this shift: No intake/output data recorded.  Physical Exam: Work of breathing is normal.  Abdomen is soft and nontender.    Lab Results:  Results for orders placed or performed during the hospital encounter of 02/07/17 (from the past 48 hour(s))  CBC     Status: None   Collection Time: 02/09/17  3:47 AM  Result Value Ref Range   WBC 6.7 4.0 - 10.5 K/uL   RBC 4.51 3.87 - 5.11 MIL/uL   Hemoglobin 13.1 12.0 - 15.0 g/dL   HCT 41.9 36.0 - 46.0 %   MCV 92.9 78.0 - 100.0 fL   MCH 29.0 26.0 - 34.0 pg   MCHC 31.3 30.0 - 36.0 g/dL   RDW 14.2 11.5 - 15.5 %   Platelets 205 150 - 400 K/uL  Basic metabolic panel     Status: Abnormal   Collection Time: 02/09/17  3:47 AM  Result Value Ref Range   Sodium 139 135 - 145 mmol/L   Potassium 4.1 3.5 - 5.1 mmol/L   Chloride 105 101 - 111 mmol/L   CO2 27 22 - 32 mmol/L   Glucose, Bld 109 (H) 65 - 99 mg/dL   BUN 8 6 - 20 mg/dL   Creatinine, Ser 0.70 0.44 - 1.00 mg/dL   Calcium 8.5 (L) 8.9 - 10.3 mg/dL   GFR calc non Af Amer >60 >60 mL/min   GFR calc Af Amer >60 >60 mL/min    Comment: (NOTE) The eGFR has been calculated using the CKD EPI equation. This calculation has not been validated in all clinical situations. eGFR's persistently <60 mL/min signify  possible Chronic Kidney Disease.    Anion gap 7 5 - 15    Radiology/Results: Dg Abd 1 View  Result Date: 02/09/2017 CLINICAL DATA:  Status post NG tube placement EXAM: ABDOMEN - 1 VIEW COMPARISON:  02/09/2017 at 0453 hours FINDINGS: Enteric tube likely terminates in the proximal duodenum. Postsurgical changes involving the stomach.  Cholecystectomy clips. IMPRESSION: Enteric tube likely terminates in the proximal duodenum. Electronically Signed   By: Julian Hy [name]   On: 02/09/2017 15:24   Dg Abd Portable 1v  Result Date: 02/10/2017 CLINICAL DATA:  Continued surveillance of obstruction/ileus. EXAM: PORTABLE ABDOMEN - 1 VIEW COMPARISON:  02/09/2017. FINDINGS: Nasogastric tube may be coiled in the pylorus. There are no features suggestive of small bowel obstruction on today's portable supine radiograph. Enteric contrast within the colon is redemonstrated, without significant transit. IMPRESSION: No features suggestive of small bowel distention are evident. Electronically Signed   By: Staci Righter [name]   On: 02/10/2017 08:40   Dg Abd Portable 2v  Result Date: 02/09/2017 CLINICAL DATA:  SBO (small bowel obstruction) Hx gastroplasty, cholecystectomy, appendectomy, hysterectomy. EXAM: PORTABLE ABDOMEN - 2  VIEW COMPARISON:  CT 02/07/2017 FINDINGS: Anastomotic staple line in the epigastrium. Cholecystectomy clips. Visualized lung bases clear. No free air on decubitus. A few distended small bowel loops in the mid and lower abdomen, with scattered fluid levels on the decubitus radiograph. Residual contrast material in the nondilated colon. Regional bones unremarkable. IMPRESSION: 1. Persistent fluid levels in distended small bowel loops suggesting residual distal SBO or ileus. 2. No free air. Electronically Signed   By: Lucrezia Europe [name]   On: 02/09/2017 11:26    Anti-infectives: Anti-infectives    None      Assessment/Plan: Problem List: Patient Active Problem List   Diagnosis Date Noted   . SBO (small bowel obstruction) ([place]) 02/07/2017  . 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  . Abdominal pain, LLQ 01/26/2017  . Intussusception ([place]) 01/26/2017  . Hearing loss 07/13/2016  . Influenza 09/16/2015  . Headache 12/24/2014  . Neoplasm of uncertain behavior of skin 12/24/2014  . 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 Dysuria 04/07/2014  . Cough 08/25/2013  . Well adult exam 06/16/2013  . Sinusitis, chronic 06/09/2013  . URI, acute 06/09/2012  . Restless legs syndrome (RLS) 01/26/2011  . FINGER PAIN 07/28/2010  . PALPITATIONS 10/18/2008  . VITAMIN D DEFICIENCY 10/09/2008  . DYSLIPIDEMIA 10/09/2008  . Morbid obesity ([place]) 10/09/2008  . Essential hypertension 10/09/2008  . SYNCOPE 09/27/2008  . UNSPECIFIED EUSTACHIAN TUBE DISORDER 04/09/2008  . Anxiety state 10/07/2007  . INSOMNIA, PERSISTENT 07/08/2007  . Adjustment disorder with mixed anxiety and depressed mood 07/08/2007  . RHINITIS 07/08/2007  . B12 deficiency 04/07/2007  . ANEMIA, DUE TO BLOOD LOSS 04/07/2007  . OSTEOARTHRITIS 04/07/2007  . GERD 04/04/2007  . Diverticulosis of colon 04/04/2007    xrays look good with contrast in colon--there is somewhat of a disconnect between the xrays and her clinical behavior.  Discussed with Dr. Excell Seltzer and will observe for flatus and BMs with NG in place.  May consider small bowel protocol if no flatus or BMs soon.   * No surgery found *    LOS: 3 days   Matt B. Hassell Done, MD, [facility] [phone] beeper [phone]  02/10/2017 11:24 AM

## 2017-02-11 ENCOUNTER — Inpatient Hospital Stay (HOSPITAL_COMMUNITY): Payer: Medicare Other

## 2017-02-11 MED ORDER — METOCLOPRAMIDE HCL 5 MG/ML IJ SOLN
5.0000 mg | Freq: Three times a day (TID) | INTRAMUSCULAR | Status: DC
  Administered 2017-02-11 – 2017-02-16 (×14): 5 mg via INTRAVENOUS
  Filled 2017-02-11 (×14): qty 2

## 2017-02-11 MED ORDER — SODIUM CHLORIDE 0.9 % IV SOLN: 100.0000 mg | Freq: Four times a day (QID) | INTRAVENOUS | Status: DC

## 2017-02-11 MED ORDER — METOPROLOL TARTRATE 5 MG/5ML IV SOLN
5.0000 mg | Freq: Four times a day (QID) | INTRAVENOUS | Status: DC
  Administered 2017-02-11 – 2017-02-16 (×16): 5 mg via INTRAVENOUS
  Filled 2017-02-11 (×16): qty 5

## 2017-02-11 NOTE — Progress Notes (Signed)
PROGRESS NOTE  AVEY MCMANAMON  IWP:[id number] DOB: 05/19/1946 DOA: 02/07/2017 PCP: Cassandria Anger, MD  Brief Narrative:   [name] is an 71 y.o. female with recent admission for small bowel obstruction secondary to jejunojejunal intussusception who was discharged 3 days prior to admission returns for recurrent abdominal pain nausea and vomiting, and decreased flatus.  CT demonstrated ileus vs. Low grade SBO.  NG placed for comfort on 8/25 with copious bilious and borderline feculent output   Assessment & Plan:   Active Problems:   Adjustment disorder with mixed anxiety and depressed mood   Essential hypertension   GERD   Restless legs syndrome (RLS)   SBO (small bowel obstruction) (HCC)   Ventricular trigeminy  Suspect post-operative ileus.  Passing flatus but no BMs.  Persistently hypoactive BS -  NG tube placed, 833mL out -  Continue IVF -  Keep electrolytes wnl:  Magnesium and potassium wnl -  General surgery assistance appreciated -  KUB:  Contrast in the colon, improved bowel distension compared to prior -  Agree with trial of reglan or erythromycin  EPIGASTRIC PAIN -  Continue protonix BID  BIGEMINY, fewer runs of SVT since starting metoprolol.  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  -  asymptomatic  -  Keep electrolytes wnl -  Continue scheduled metoprolol IV while NPO  HTN, blood pressures stable to mildly elevated -  Increase metoprolol while NPO -  Resume verapamil 240 daily once diet advanced  DEPRESSION Venlafaxine, Xanax, trazodone and bupropion are being held as patient is nothing by mouth. - continue IV lorazepam for management of anxiety and/or withdrawal.  HL Lovastatin being held   DVT prophylaxis: Lovenox. Code Status: Full code. Family Communication: Patient alone with family friend at bedside Disposition Plan: Home in a few days  Consultants:   General surgery  Procedures:  NG placed on 8/25  Antimicrobials:    Anti-infectives    Start     Dose/Rate Route Frequency Ordered Stop   02/11/17 1430  erythromycin 100 mg in sodium chloride 0.9 % 100 mL IVPB  Status:  Discontinued     100 mg 100 mL/hr over 60 Minutes Intravenous Every 6 hours 02/11/17 1422 02/11/17 1435       Subjective:  Abdominal distention and pain are better. She is having less nausea. She is passing flatness but no bowel movements. Her throat is sore from her NG tube.  Objective: Vitals:   02/11/17 0209 02/11/17 0300 02/11/17 0453 02/11/17 1638  BP: 140/61  (!) 153/66 (!) 162/97  Pulse: 72  84 93  Resp:   18 19  Temp:   98.3 F (36.8 C) 98 F (36.7 C)  TempSrc:   Oral Oral  SpO2:   97% 96%  Weight:  90.7 kg (199 lb 14.4 oz)    Height:        Intake/Output Summary (Last 24 hours) at 02/11/17 1925 Last data filed at 02/11/17 0734  Gross per 24 hour  Intake          2456.67 ml  Output              850 ml  Net          1606.67 ml   Filed Weights   02/09/17 0646 02/10/17 0501 02/11/17 0300  Weight: 93.2 kg (205 lb 8 oz) 92.1 kg (203 lb 1.6 oz) 90.7 kg (199 lb 14.4 oz)    Examination:  General exam:  Adult  female.  No acute distress.  HEENT:  NCAT, MMM Respiratory system: Clear to auscultation bilaterally Cardiovascular system: Regular rate and rhythm, normal S1/S2. No murmurs, rubs, gallops or clicks.  Warm extremities Gastrointestinal system: Hypoactive bowel sounds, mildly distended, nontender MSK:  Normal tone and bulk, no lower extremity edema Neuro:  Grossly intact Psych: Tearful affect  Data Reviewed: I have personally reviewed following labs and imaging studies  CBC:  Recent Labs Lab 02/07/17 0846 02/08/17 0437 02/09/17 0347  WBC 12.2* 8.0 6.7  HGB 13.3 11.7* 13.1  HCT 41.4 37.2 41.9  MCV 91.4 93.0 92.9  PLT 283 236 016   Basic Metabolic Panel:  Recent Labs Lab 02/07/17 0846 02/07/17 1540 02/08/17 0437 02/09/17 0347  NA 141  --  142 139  K 4.2  --  4.1 4.1  CL 105  --  111 105   CO2 26  --  26 27  GLUCOSE 119*  --  113* 109*  BUN 13  --  10 8  CREATININE 0.73  --  0.72 0.70  CALCIUM 9.3  --  8.5* 8.5*  MG  --  2.0  --   --    GFR: Estimated Creatinine Clearance: 65.5 mL/min (by C-G formula based on SCr of 0.7 mg/dL). Liver Function Tests:  Recent Labs Lab 02/07/17 0846 02/08/17 0437  AST 19 15  ALT 19 15  ALKPHOS 75 61  BILITOT 1.1 1.0  PROT 7.1 5.7*  ALBUMIN 4.2 3.2*    Recent Labs Lab 02/07/17 0846  LIPASE 22   No results for input(s): AMMONIA in the last 168 hours. Coagulation Profile: No results for input(s): INR, PROTIME in the last 168 hours. Cardiac Enzymes: No results for input(s): CKTOTAL, CKMB, CKMBINDEX, TROPONINI in the last 168 hours. BNP (last 3 results) No results for input(s): PROBNP in the last 8760 hours. HbA1C: No results for input(s): HGBA1C in the last 72 hours. CBG: No results for input(s): GLUCAP in the last 168 hours. Lipid Profile: No results for input(s): CHOL, HDL, LDLCALC, TRIG, CHOLHDL, LDLDIRECT in the last 72 hours. Thyroid Function Tests: No results for input(s): TSH, T4TOTAL, FREET4, T3FREE, THYROIDAB in the last 72 hours. Anemia Panel: No results for input(s): VITAMINB12, FOLATE, FERRITIN, TIBC, IRON, RETICCTPCT in the last 72 hours. Urine analysis:    Component Value Date/Time   COLORURINE YELLOW 02/07/2017 0847   APPEARANCEUR CLEAR 02/07/2017 0847   LABSPEC 1.026 02/07/2017 0847   PHURINE 6.0 02/07/2017 0847   GLUCOSEU NEGATIVE 02/07/2017 0847   GLUCOSEU NEGATIVE 01/16/2017 [place] 02/07/2017 0847   BILIRUBINUR NEGATIVE 02/07/2017 0847   BILIRUBINUR neg 01/26/2017 1031   KETONESUR 20 (A) 02/07/2017 0847   PROTEINUR 30 (A) 02/07/2017 0847   UROBILINOGEN 0.2 01/26/2017 1031   UROBILINOGEN 0.2 01/16/2017 1011   NITRITE NEGATIVE 02/07/2017 0847   LEUKOCYTESUR TRACE (A) 02/07/2017 0847   Sepsis Labs: @LABRCNTIP (procalcitonin:4,lacticidven:4)  ) No results found for this or any  previous visit (from the past 240 hour(s)).    Radiology Studies: Dg Abd Portable 1v  Result Date: 02/11/2017 CLINICAL DATA:  Ileus EXAM: PORTABLE ABDOMEN - 1 VIEW COMPARISON:  02/10/2017 abdominal radiograph FINDINGS: Surgical clips and disproportionately sutures are noted in the medial upper abdomen bilaterally, unchanged. Enteric tube terminates in the distal stomach. No disproportionately dilated small bowel loops. Retained contrast in the large bowel, slightly decreased. No evidence of pneumatosis or pneumoperitoneum. Clear lung bases. No radiopaque urolithiasis. IMPRESSION: Enteric tube terminates in the distal stomach. Nonobstructive bowel  gas pattern. Slight decrease in retained contrast in the large bowel. Electronically Signed   By: Ilona Sorrel [name]   On: 02/11/2017 08:58   Dg Abd Portable 1v  Result Date: 02/10/2017 CLINICAL DATA:  Continued surveillance of obstruction/ileus. EXAM: PORTABLE ABDOMEN - 1 VIEW COMPARISON:  02/09/2017. FINDINGS: Nasogastric tube may be coiled in the pylorus. There are no features suggestive of small bowel obstruction on today's portable supine radiograph. Enteric contrast within the colon is redemonstrated, without significant transit. IMPRESSION: No features suggestive of small bowel distention are evident. Electronically Signed   By: Staci Righter [name]   On: 02/10/2017 08:40     Scheduled Meds: . enoxaparin (LOVENOX) injection  40 mg Subcutaneous Q24H  . fluticasone  2 spray Each Nare Daily  . metoCLOPramide (REGLAN) injection  5 mg Intravenous Q8H  . metoprolol tartrate  2.5 mg Intravenous Q6H  . pantoprazole (PROTONIX) IV  40 mg Intravenous Q12H   Continuous Infusions: . dextrose 5% lactated ringers with KCl 20 mEq/L 100 mL/hr at 02/11/17 0734     LOS: 4 days    Time spent: 30 min    Janece Canterbury, MD [facility] Pager [phone]  If 7PM-7AM, please contact night-coverage [email or web address] Password [facility] 02/11/2017, 7:25 PM

## 2017-02-11 NOTE — Care Management Important Message (Signed)
Important Message  Patient Details  Name: [name] MRN: [id number] Date of Birth: 1945-12-03   Medicare Important Message Given:  Yes    [name], [name] 02/11/2017, 11:29 [place] Message  Patient Details  Name: [name] MRN: [id number] Date of Birth: 1945-06-30   Medicare Important Message Given:  Yes    [name], [name] 02/11/2017, 11:29 AM

## 2017-02-11 NOTE — Progress Notes (Signed)
[facility] Progress Note     Subjective: CC: ileus Patient with persistent ileus. Passed very small amount flatus with suppository, no BM. Occasional nausea, no vomiting around tube. Patient reports that she has taken erythromycin in the past to assist with motility.   Objective: Vital signs in last 24 hours: Temp:  [97.7 F (36.5 C)-98.3 F (36.8 C)] 98.3 F (36.8 C) (08/27 0453) Pulse Rate:  [72-87] 84 (08/27 0453) Resp:  [15-18] 18 (08/27 0453) BP: (140-157)/(58-71) 153/66 (08/27 0453) SpO2:  [94 %-97 %] 97 % (08/27 0453) Weight:  [90.7 kg (199 lb 14.4 oz)] 90.7 kg (199 lb 14.4 oz) (08/27 0300) Last BM Date: 02/06/17  Intake/Output from previous day: 08/26 0701 - 08/27 0700 In: 0  Out: 850 [Emesis/NG output:850] Intake/Output this shift: Total I/O In: 2456.7 [I.V.:2456.7] Out: -   PE: Gen:  Alert, NAD, pleasant Card:  Regular rate and rhythm Pulm:  Normal effort, clear to auscultation bilaterally Abd: Soft, non-tender, non-distended, bowel sounds hypoactive, no HSM, incisions C/D/I Skin: warm and dry, no rashes  Psych: A&Ox3   Lab Results:   Recent Labs  02/09/17 0347  WBC 6.7  HGB 13.1  HCT 41.9  PLT 205   BMET  Recent Labs  02/09/17 0347  NA 139  K 4.1  CL 105  CO2 27  GLUCOSE 109*  BUN 8  CREATININE 0.70  CALCIUM 8.5*   CMP     Component Value Date/Time   NA 139 02/09/2017 0347   K 4.1 02/09/2017 0347   CL 105 02/09/2017 0347   CO2 27 02/09/2017 0347   GLUCOSE 109 (H) 02/09/2017 0347   BUN 8 02/09/2017 0347   CREATININE 0.70 02/09/2017 0347   CALCIUM 8.5 (L) 02/09/2017 0347   PROT 5.7 (L) 02/08/2017 0437   ALBUMIN 3.2 (L) 02/08/2017 0437   AST 15 02/08/2017 0437   ALT 15 02/08/2017 0437   ALKPHOS 61 02/08/2017 0437   BILITOT 1.0 02/08/2017 0437   GFRNONAA >60 02/09/2017 0347   GFRAA >60 02/09/2017 0347   Lipase     Component Value Date/Time   LIPASE 22 02/07/2017 0846    Studies/Results: Dg Abd 1  View  Result Date: 02/09/2017 CLINICAL DATA:  Status post NG tube placement EXAM: ABDOMEN - 1 VIEW COMPARISON:  02/09/2017 at 0453 hours FINDINGS: Enteric tube likely terminates in the proximal duodenum. Postsurgical changes involving the stomach.  Cholecystectomy clips. IMPRESSION: Enteric tube likely terminates in the proximal duodenum. Electronically Signed   By: Julian Hy [name]   On: 02/09/2017 15:24   Dg Abd Portable 1v  Result Date: 02/11/2017 CLINICAL DATA:  Ileus EXAM: PORTABLE ABDOMEN - 1 VIEW COMPARISON:  02/10/2017 abdominal radiograph FINDINGS: Surgical clips and disproportionately sutures are noted in the medial upper abdomen bilaterally, unchanged. Enteric tube terminates in the distal stomach. No disproportionately dilated small bowel loops. Retained contrast in the large bowel, slightly decreased. No evidence of pneumatosis or pneumoperitoneum. Clear lung bases. No radiopaque urolithiasis. IMPRESSION: Enteric tube terminates in the distal stomach. Nonobstructive bowel gas pattern. Slight decrease in retained contrast in the large bowel. Electronically Signed   By: Ilona Sorrel [name]   On: 02/11/2017 08:58   Dg Abd Portable 1v  Result Date: 02/10/2017 CLINICAL DATA:  Continued surveillance of obstruction/ileus. EXAM: PORTABLE ABDOMEN - 1 VIEW COMPARISON:  02/09/2017. FINDINGS: Nasogastric tube may be coiled in the pylorus. There are no features suggestive of small bowel obstruction on today's portable supine radiograph. Enteric contrast within the  colon is redemonstrated, without significant transit. IMPRESSION: No features suggestive of small bowel distention are evident. Electronically Signed   By: Staci Righter [name]   On: 02/10/2017 08:40     Assessment/Plan Ileus S/P LOA 8/15 by Dr. Excell Seltzer Intussusception  - XR: Enteric tube terminates in the distal stomach. Nonobstructive bowel gas pattern. Slight decrease in retained contrast in the large bowel - NGT placed 8/25 - 850  cc output recorded in last 24 hr - OOB and ambulate patient - NPO, IVF - patient has been NPO since 8/23, if she does not open up soon may need to consider TNA - will discuss with MD if there would be any benefit to try some low dose reglan or erythromycin for motility  FEN - NPO, IVF  VTE - SCDs, lovenox ID - no current abx  Plan: Continue NPO,IVF, NGT. Ambulate patient. Await return of bowel function.   LOS: 4 days    Brigid Re , [facility] 02/11/2017, 1:06 PM Pager: [phone] Consults: [phone] Mon-Fri 7:00 am-4:30 pm Sat-Sun 7:00 am-11:30 am

## 2017-02-12 ENCOUNTER — Inpatient Hospital Stay (HOSPITAL_COMMUNITY): Payer: Medicare Other

## 2017-02-12 LAB — RENAL FUNCTION PANEL
ALBUMIN: 3.3 g/dL — AB (ref 3.5–5.0)
Anion gap: 7 (ref 5–15)
CALCIUM: 8.9 mg/dL (ref 8.9–10.3)
CO2: 29 mmol/L (ref 22–32)
CREATININE: 0.7 mg/dL (ref 0.44–1.00)
Chloride: 106 mmol/L (ref 101–111)
GFR calc Af Amer: >60 mL/min (ref >60)
GFR calc non Af Amer: >60 mL/min (ref >60)
GLUCOSE: 113 mg/dL — AB (ref 65–99)
PHOSPHORUS: 4.4 mg/dL (ref 2.5–4.6)
Potassium: 4 mmol/L (ref 3.5–5.1)
SODIUM: 142 mmol/L (ref 135–145)

## 2017-02-12 LAB — URINALYSIS, ROUTINE W REFLEX MICROSCOPIC
Bacteria, UA: NONE SEEN
Bilirubin Urine: NEGATIVE
Glucose, UA: NEGATIVE mg/dL
Hgb urine dipstick: NEGATIVE
Ketones, ur: NEGATIVE mg/dL
Leukocytes, UA: NEGATIVE
Nitrite: NEGATIVE
Protein, ur: 30 mg/dL — AB
Specific Gravity, Urine: 1.01 (ref 1.005–1.030)
pH: 8.5 — ABNORMAL HIGH (ref 5.0–8.0)

## 2017-02-12 LAB — MAGNESIUM: Magnesium: 1.8 mg/dL (ref 1.7–2.4)

## 2017-02-12 MED ORDER — KCL IN DEXTROSE-NACL 20-5-0.45 MEQ/L-%-% IV SOLN
INTRAVENOUS | Status: DC
Start: 2017-02-12 — End: 2017-02-17
  Administered 2017-02-12 – 2017-02-13 (×3): via INTRAVENOUS
  Administered 2017-02-13: 100 mL/h via INTRAVENOUS
  Administered 2017-02-14: 15:00:00 via INTRAVENOUS
  Administered 2017-02-15: 1000 mL via INTRAVENOUS
  Administered 2017-02-15: 01:00:00 via INTRAVENOUS
  Filled 2017-02-12 (×9): qty 1000

## 2017-02-12 MED ORDER — MAGNESIUM SULFATE 2 GM/50ML IV SOLN
2.0000 g | Freq: Once | INTRAVENOUS | Status: AC
  Administered 2017-02-12: 2 g via INTRAVENOUS
  Filled 2017-02-12: qty 50

## 2017-02-12 MED ORDER — DEXTROSE 5 % IV SOLN
1.0000 g | INTRAVENOUS | Status: DC
  Administered 2017-02-12 – 2017-02-14 (×3): 1 g via INTRAVENOUS
  Filled 2017-02-12 (×4): qty 10

## 2017-02-12 MED ORDER — BISACODYL 10 MG RE SUPP
10.0000 mg | Freq: Once | RECTAL | Status: AC
  Administered 2017-02-12: 10 mg via RECTAL
  Filled 2017-02-12: qty 1

## 2017-02-12 MED ORDER — BISACODYL 10 MG RE SUPP
10.0000 mg | Freq: Every day | RECTAL | Status: DC
  Administered 2017-02-13 – 2017-02-17 (×4): 10 mg via RECTAL
  Filled 2017-02-12 (×5): qty 1

## 2017-02-12 NOTE — Progress Notes (Signed)
Dulcolax suppository given, patient has a small amount of loose bowel movement,.

## 2017-02-12 NOTE — Care Management Note (Signed)
Case Management Note  Patient Details  Name: [name] MRN: [id number] Date of Birth: 1945/10/12  Subjective/Objective:      Pt admitted with SBO              Action/Plan:CT demonstrated ileus vs. Low grade SBO.  NG placed for comfort on 8/25 with copious bilious and borderline feculent output    Expected Discharge Date:   (unknown)               Expected Discharge Plan:  Home/Self Care  In-House Referral:     Discharge planning Services  CM Consult  Post Acute Care Choice:    Choice offered to:     DME Arranged:    DME Agency:     HH Arranged:    [place] Agency:     Status of Service:  In process, will continue to follow  If discussed at Long Length of Stay Meetings, dates discussed:    Additional CommentsPurcell Mouton, RN 02/12/2017, 11:50 AM

## 2017-02-12 NOTE — Progress Notes (Signed)
Patient ID: [name], female   DOB: 05/08/1946, 71 y.o.   MRN: [id number]  [facility] Progress Note     Subjective: CC- ileus Patient states that she had a BM this morning and she is passing a small amount of flatus. NG tube in place. Some occasional nausea, but no emesis. She has been ambulating in the halls.  Objective: Vital signs in last 24 hours: Temp:  [97.9 F (36.6 C)-98.3 F (36.8 C)] 98.3 F (36.8 C) (08/28 0900) Pulse Rate:  [74-93] 81 (08/28 0900) Resp:  [16-19] 16 (08/28 0900) BP: (132-162)/(62-97) 151/62 (08/28 0900) SpO2:  [95 %-96 %] 95 % (08/28 0404) Weight:  [198 lb 9.6 oz (90.1 kg)-198 lb 10.2 oz (90.1 kg)] 198 lb 10.2 oz (90.1 kg) (08/28 0900) Last BM Date: 02/12/17  Intake/Output from previous day: 08/27 0701 - 08/28 0700 In: 4608.3 [I.V.:4608.3] Out: 1100 [Emesis/NG output:1100] Intake/Output this shift: Total I/O In: 4 [IV Piggyback:50] Out: 55 [Urine:50]  PE: Gen:  Alert, NAD, pleasant HEENT: EOM's intact, pupils equal and round Card:  RRR, no M/G/R heard Pulm:  CTAB, no W/R/R, effort normal Abd: Soft, ND, mild lower abdominal tenderness, few BS heard, no HSM, no hernia, lap incisions C/D/I Psych: A&Ox3  Skin: no rashes noted, warm and dry  Lab Results:  No results for input(s): WBC, HGB, HCT, PLT in the last 72 hours. BMET  Recent Labs  02/12/17 0352  NA 142  K 4.0  CL 106  CO2 29  GLUCOSE 113*  BUN <5*  CREATININE 0.70  CALCIUM 8.9   PT/INR No results for input(s): LABPROT, INR in the last 72 hours. CMP     Component Value Date/Time   NA 142 02/12/2017 0352   K 4.0 02/12/2017 0352   CL 106 02/12/2017 0352   CO2 29 02/12/2017 0352   GLUCOSE 113 (H) 02/12/2017 0352   BUN <5 (L) 02/12/2017 0352   CREATININE 0.70 02/12/2017 0352   CALCIUM 8.9 02/12/2017 0352   PROT 5.7 (L) 02/08/2017 0437   ALBUMIN 3.3 (L) 02/12/2017 0352   AST 15 02/08/2017 0437   ALT 15 02/08/2017 0437   ALKPHOS 61 02/08/2017 0437   BILITOT 1.0 02/08/2017 0437   GFRNONAA >60 02/12/2017 0352   GFRAA >60 02/12/2017 0352   Lipase     Component Value Date/Time   LIPASE 22 02/07/2017 0846       Studies/Results: Dg Abd Portable 1v  Result Date: 02/11/2017 CLINICAL DATA:  Ileus EXAM: PORTABLE ABDOMEN - 1 VIEW COMPARISON:  02/10/2017 abdominal radiograph FINDINGS: Surgical clips and disproportionately sutures are noted in the medial upper abdomen bilaterally, unchanged. Enteric tube terminates in the distal stomach. No disproportionately dilated small bowel loops. Retained contrast in the large bowel, slightly decreased. No evidence of pneumatosis or pneumoperitoneum. Clear lung bases. No radiopaque urolithiasis. IMPRESSION: Enteric tube terminates in the distal stomach. Nonobstructive bowel gas pattern. Slight decrease in retained contrast in the large bowel. Electronically Signed   By: Ilona Sorrel [name]   On: 02/11/2017 08:58    Anti-infectives: Anti-infectives    Start     Dose/Rate Route Frequency Ordered Stop   02/12/17 0900  cefTRIAXone (ROCEPHIN) 1 g in dextrose 5 % 50 mL IVPB     1 g 100 mL/hr over 30 Minutes Intravenous Every 24 hours 02/12/17 0838     02/11/17 1430  erythromycin 100 mg in sodium chloride 0.9 % 100 mL IVPB  Status:  Discontinued     100 mg  100 mL/hr over 60 Minutes Intravenous Every 6 hours 02/11/17 1422 02/11/17 1435       Assessment/Plan Ileus S/P LOA 8/15 by Dr. Hoxworth/ Intussusception  - NGT placed 8/25 - 1100cc output/24hr - BM this AM after suppository, passing small amount of flatus  FEN - IVF, clamp NG/sips of clears from the floor VTE - SCDs, lovenox ID - rocephin 8/28>>day#1 for UTI  Plan: Daily BMs after suppositories. Continues to have high NG tube output. Continue NG tube to LIWS. Continue ambulating. Continue reglan. Will need TPN 8/30 if unable to advance diet by then.   LOS: 5 days    [place] , [facility] 02/12/2017, 10:09 AM Pager:  [phone] Consults: [phone] Mon-Fri 7:00 am-4:30 pm Sat-Sun 7:00 am-11:30 am

## 2017-02-12 NOTE — Progress Notes (Signed)
Pharmacy Antibiotic Note  [name] is a 71 y.o. female admitted on 02/07/2017 with recurrent abdominal pain, nausea, and vomiting, found to have ileus. Pharmacy has been consulted today for Ceftriaxone dosing for UTI.  Plan: Ceftriaxone 1g IV q24h.  Ceftriaxone does not require renal/hepatic adjustment, so pharmacy will sign off. Please re-consult as needed.   Height: 5' 0.5" (153.7 cm) Weight: 198 lb 10.2 oz (90.1 kg) IBW/kg (Calculated) : 46.65  Temp (24hrs), Avg:98.1 F (36.7 C), Min:97.9 F (36.6 C), Max:98.3 F (36.8 C)   Recent Labs Lab 02/07/17 0846 02/07/17 0850 02/08/17 0437 02/09/17 0347 02/12/17 0352  WBC 12.2*  --  8.0 6.7  --   CREATININE 0.73  --  0.72 0.70 0.70  LATICACIDVEN  --  1.18  --   --   --     Estimated Creatinine Clearance: 65.3 mL/min (by C-G formula based on SCr of 0.7 mg/dL).    Allergies  Allergen Reactions  . Levaquin [Levofloxacin]     nausea    Antimicrobials this admission: 8/28 >> Ceftriaxone >>  Dose adjustments this admission: --  Microbiology results: 8/28 UCx: ordered  Thank you for allowing pharmacy to be a part of this patient's care.    Lindell Spar, PharmD, BCPS Pager: [phone] 02/12/2017 11:53 AM

## 2017-02-12 NOTE — Progress Notes (Addendum)
PROGRESS NOTE  [name]  IZT:[id number] DOB: 08-09-1945 DOA: 02/07/2017 PCP: Cassandria Anger, MD  Brief Narrative:   [name] is an 71 y.o. female with recent admission for small bowel obstruction secondary to jejunojejunal intussusception who was discharged 3 days prior to admission returns for recurrent abdominal pain nausea and vomiting, and decreased flatus.  CT demonstrated ileus vs. Low grade SBO.  NG placed for comfort on 8/25 with copious bilious and borderline feculent output. Persistently hypoactive BS.  Had a very small BM today.  Trying reglan and suppositories.     Assessment & Plan:   Active Problems:   Adjustment disorder with mixed anxiety and depressed mood   Essential hypertension   GERD   Restless legs syndrome (RLS)   SBO (small bowel obstruction) (HCC)   Ventricular trigeminy  Suspect post-operative ileus.  Passing flatus and had small BM this AM, but still hypoactive BS -  NG tube  -  Continue IVF -  Keep electrolytes wnl:  Magnesium low so will supplement and potassium wnl -  General surgery assistance appreciated -  KUB:  Contrast in the colon, improved bowel distension compared to prior -  Continue reglan -  Daily bisacodyl suppositories -  If not planning to progress to clears/clamp on 8/29, place PICC for TPN (day 7)  EPIGASTRIC PAIN -  Continue protonix BID  BIGEMINY, fewer runs of SVT since starting metoprolol.  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  -  asymptomatic   -  Keep electrolytes wnl -  Continue scheduled metoprolol IV while NPO  HTN, blood pressures stable to mildly elevated -  Continue metoprolol IV -  Resume verapamil 240 daily once diet advanced  DEPRESSION Venlafaxine, Xanax, trazodone and bupropion are being held as patient is nothing by mouth. - continue IV lorazepam for management of anxiety and/or withdrawal.  HL Lovastatin being held  Dysuria -  UA positive -  Urine culture pending -   Ceftriaxone started 8/28  DVT prophylaxis: Lovenox. Code Status: Full code. Family Communication: Patient alone with family friend at bedside Disposition Plan: Home in a few days  Consultants:   General surgery  Procedures:  NG placed on 8/25  Antimicrobials:  Anti-infectives    Start     Dose/Rate Route Frequency Ordered Stop   02/12/17 0900  cefTRIAXone (ROCEPHIN) 1 g in dextrose 5 % 50 mL IVPB     1 g 100 mL/hr over 30 Minutes Intravenous Every 24 hours 02/12/17 0838     02/11/17 1430  erythromycin 100 mg in sodium chloride 0.9 % 100 mL IVPB  Status:  Discontinued     100 mg 100 mL/hr over 60 Minutes Intravenous Every 6 hours 02/11/17 1422 02/11/17 1435       Subjective:  Had a small BM this morning.  Passing scant flatus.  No nausea.  Tube bothers her nose.  Abdominal distension is improving.  Has not heard many bowel sounds herself.    Objective: Vitals:   02/11/17 2121 02/12/17 0404 02/12/17 0900 02/12/17 1411  BP: 132/75 (!) 153/72 (!) 151/62 (!) 155/80  Pulse: 74 79 81 91  Resp: 18 18 16 18   Temp: 97.9 F (36.6 C) 98 F (36.7 C) 98.3 F (36.8 C) 97.8 F (36.6 C)  TempSrc: Oral Oral Oral Oral  SpO2: 95% 95%  94%  Weight:  90.1 kg (198 lb 9.6 oz) 90.1 kg (198 lb 10.2 oz)   Height:  Intake/Output Summary (Last 24 hours) at 02/12/17 1958 Last data filed at 02/12/17 1500  Gross per 24 hour  Intake          2516.67 ml  Output             1550 ml  Net           966.67 ml   Filed Weights   02/11/17 0300 02/12/17 0404 02/12/17 0900  Weight: 90.7 kg (199 lb 14.4 oz) 90.1 kg (198 lb 9.6 oz) 90.1 kg (198 lb 10.2 oz)    Examination:  General exam:  Adult female.  No acute distress.  HEENT:  NCAT, MMM Respiratory system: Clear to auscultation bilaterally Cardiovascular system: Regular rate and rhythm, normal S1/S2. No murmurs, rubs, gallops or clicks.  Warm extremities Gastrointestinal system:  Hypoactive bowel sounds, soft, mildly distended,  nontender. MSK:  Normal tone and bulk, no lower extremity edema Neuro:  Grossly intact  Data Reviewed: I have personally reviewed following labs and imaging studies  CBC:  Recent Labs Lab 02/07/17 0846 02/08/17 0437 02/09/17 0347  WBC 12.2* 8.0 6.7  HGB 13.3 11.7* 13.1  HCT 41.4 37.2 41.9  MCV 91.4 93.0 92.9  PLT 283 236 169   Basic Metabolic Panel:  Recent Labs Lab 02/07/17 0846 02/07/17 1540 02/08/17 0437 02/09/17 0347 02/12/17 0352  NA 141  --  142 139 142  K 4.2  --  4.1 4.1 4.0  CL 105  --  111 105 106  CO2 26  --  26 27 29   GLUCOSE 119*  --  113* 109* 113*  BUN 13  --  10 8 <5*  CREATININE 0.73  --  0.72 0.70 0.70  CALCIUM 9.3  --  8.5* 8.5* 8.9  MG  --  2.0  --   --  1.8  PHOS  --   --   --   --  4.4   GFR: Estimated Creatinine Clearance: 65.3 mL/min (by C-G formula based on SCr of 0.7 mg/dL). Liver Function Tests:  Recent Labs Lab 02/07/17 0846 02/08/17 0437 02/12/17 0352  AST 19 15  --   ALT 19 15  --   ALKPHOS 75 61  --   BILITOT 1.1 1.0  --   PROT 7.1 5.7*  --   ALBUMIN 4.2 3.2* 3.3*    Recent Labs Lab 02/07/17 0846  LIPASE 22   No results for input(s): AMMONIA in the last 168 hours. Coagulation Profile: No results for input(s): INR, PROTIME in the last 168 hours. Cardiac Enzymes: No results for input(s): CKTOTAL, CKMB, CKMBINDEX, TROPONINI in the last 168 hours. BNP (last 3 results) No results for input(s): PROBNP in the last 8760 hours. HbA1C: No results for input(s): HGBA1C in the last 72 hours. CBG: No results for input(s): GLUCAP in the last 168 hours. Lipid Profile: No results for input(s): CHOL, HDL, LDLCALC, TRIG, CHOLHDL, LDLDIRECT in the last 72 hours. Thyroid Function Tests: No results for input(s): TSH, T4TOTAL, FREET4, T3FREE, THYROIDAB in the last 72 hours. Anemia Panel: No results for input(s): VITAMINB12, FOLATE, FERRITIN, TIBC, IRON, RETICCTPCT in the last 72 hours. Urine analysis:    Component Value Date/Time    COLORURINE YELLOW (A) 02/12/2017 0554   APPEARANCEUR CLEAR 02/12/2017 0554   LABSPEC 1.010 02/12/2017 0554   PHURINE 8.5 (H) 02/12/2017 0554   GLUCOSEU NEGATIVE 02/12/2017 0554   GLUCOSEU NEGATIVE 01/16/2017 [place] 02/12/2017 0554   BILIRUBINUR NEGATIVE 02/12/2017 0554   BILIRUBINUR neg  01/26/2017 1031   KETONESUR NEGATIVE 02/12/2017 0554   PROTEINUR 30 (A) 02/12/2017 0554   UROBILINOGEN 0.2 01/26/2017 1031   UROBILINOGEN 0.2 01/16/2017 1011   NITRITE NEGATIVE 02/12/2017 0554   LEUKOCYTESUR NEGATIVE 02/12/2017 0554   Sepsis Labs: @LABRCNTIP (procalcitonin:4,lacticidven:4)  ) No results found for this or any previous visit (from the past 240 hour(s)).    Radiology Studies: Dg Abd Portable 1v  Result Date: 02/12/2017 CLINICAL DATA:  Ileus, morbid obesity EXAM: PORTABLE ABDOMEN - 1 VIEW COMPARISON:  Abdomen films of 02/11/2017 FINDINGS: NG tube curls in the right upper quadrant probably within the duodenum bulb. Multiple surgical clips and sutures are present near the gastroesophageal junction. The bowel gas pattern is nonspecific. Some contrast is present within the nondistended colon. IMPRESSION: NG tube tip probably within duodenal bulb. Nonspecific bowel gas pattern. Electronically Signed   By: Ivar Drape [name]   On: 02/12/2017 10:33   Dg Abd Portable 1v  Result Date: 02/11/2017 CLINICAL DATA:  Ileus EXAM: PORTABLE ABDOMEN - 1 VIEW COMPARISON:  02/10/2017 abdominal radiograph FINDINGS: Surgical clips and disproportionately sutures are noted in the medial upper abdomen bilaterally, unchanged. Enteric tube terminates in the distal stomach. No disproportionately dilated small bowel loops. Retained contrast in the large bowel, slightly decreased. No evidence of pneumatosis or pneumoperitoneum. Clear lung bases. No radiopaque urolithiasis. IMPRESSION: Enteric tube terminates in the distal stomach. Nonobstructive bowel gas pattern. Slight decrease in retained contrast in the  large bowel. Electronically Signed   By: Ilona Sorrel [name]   On: 02/11/2017 08:58     Scheduled Meds: . enoxaparin (LOVENOX) injection  40 mg Subcutaneous Q24H  . fluticasone  2 spray Each Nare Daily  . metoCLOPramide (REGLAN) injection  5 mg Intravenous Q8H  . metoprolol tartrate  5 mg Intravenous Q6H  . pantoprazole (PROTONIX) IV  40 mg Intravenous Q12H   Continuous Infusions: . cefTRIAXone (ROCEPHIN)  IV Stopped (02/12/17 1040)  . dextrose 5 % and 0.45 % NaCl with KCl 20 mEq/L 100 mL/hr at 02/12/17 1151     LOS: 5 days    Time spent: 30 min    Janece Canterbury, MD [facility] Pager [phone]  If 7PM-7AM, please contact night-coverage [email or web address] Password [facility] 02/12/2017, 7:58 PM

## 2017-02-13 DIAGNOSIS — F4323 Adjustment disorder with mixed anxiety and depressed mood: Secondary | ICD-10-CM

## 2017-02-13 DIAGNOSIS — E860 Dehydration: Secondary | ICD-10-CM

## 2017-02-13 MED ORDER — BISACODYL 10 MG RE SUPP: 10.0000 mg | Freq: Every day | RECTAL | Status: DC | PRN

## 2017-02-13 NOTE — Progress Notes (Signed)
Patient ID: [name], female   DOB: 03-Dec-1945, 71 y.o.   MRN: [id number]  [facility] Surgery Progress Note     Subjective: CC- ileus Patient states that she had 2 small BMs yesterday; one after suppository and one about 3-4 hours later. She passed some flatus yesterday, none this morning. NG tube with 850cc output over last 24 hours. She denies any current abdominal pain.  Objective: Vital signs in last 24 hours: Temp:  [97.8 F (36.6 C)-98.7 F (37.1 C)] 98.2 F (36.8 C) (08/29 0532) Pulse Rate:  [65-91] 85 (08/29 0532) Resp:  [16-18] 16 (08/29 0532) BP: (151-157)/(62-80) 157/76 (08/29 0532) SpO2:  [94 %-96 %] 94 % (08/29 0532) Weight:  [198 lb 10.2 oz (90.1 kg)] 198 lb 10.2 oz (90.1 kg) (08/28 0900) Last BM Date: 02/12/17 (2 small)  Intake/Output from previous day: 08/28 0701 - 08/29 0700 In: 1365 [I.V.:1315; IV Piggyback:50] Out: 900 [Urine:50; Emesis/NG output:850] Intake/Output this shift: Total I/O In: -  Out: 300 [Urine:300]  PE: Gen:  Alert, NAD, pleasant HEENT: EOM's intact, pupils equal and round Card:  RRR, no M/G/R heard Pulm:  CTAB, no W/R/R, effort normal Abd: Soft, ND, mild lower abdominal tenderness, +BS, no HSM, no hernia, lap incisions C/D/I Psych: A&Ox3  Skin: no rashes noted, warm and dry   Lab Results:  No results for input(s): WBC, HGB, HCT, PLT in the last 72 hours. BMET  Recent Labs  02/12/17 0352  NA 142  K 4.0  CL 106  CO2 29  GLUCOSE 113*  BUN <5*  CREATININE 0.70  CALCIUM 8.9   PT/INR No results for input(s): LABPROT, INR in the last 72 hours. CMP     Component Value Date/Time   NA 142 02/12/2017 0352   K 4.0 02/12/2017 0352   CL 106 02/12/2017 0352   CO2 29 02/12/2017 0352   GLUCOSE 113 (H) 02/12/2017 0352   BUN <5 (L) 02/12/2017 0352   CREATININE 0.70 02/12/2017 0352   CALCIUM 8.9 02/12/2017 0352   PROT 5.7 (L) 02/08/2017 0437   ALBUMIN 3.3 (L) 02/12/2017 0352   AST 15 02/08/2017 0437   ALT 15 02/08/2017  0437   ALKPHOS 61 02/08/2017 0437   BILITOT 1.0 02/08/2017 0437   GFRNONAA >60 02/12/2017 0352   GFRAA >60 02/12/2017 0352   Lipase     Component Value Date/Time   LIPASE 22 02/07/2017 0846       Studies/Results: Dg Abd Portable 1v  Result Date: 02/12/2017 CLINICAL DATA:  Ileus, morbid obesity EXAM: PORTABLE ABDOMEN - 1 VIEW COMPARISON:  Abdomen films of 02/11/2017 FINDINGS: NG tube curls in the right upper quadrant probably within the duodenum bulb. Multiple surgical clips and sutures are present near the gastroesophageal junction. The bowel gas pattern is nonspecific. Some contrast is present within the nondistended colon. IMPRESSION: NG tube tip probably within duodenal bulb. Nonspecific bowel gas pattern. Electronically Signed   By: Ivar Drape [name]   On: 02/12/2017 10:33   Dg Abd Portable 1v  Result Date: 02/11/2017 CLINICAL DATA:  Ileus EXAM: PORTABLE ABDOMEN - 1 VIEW COMPARISON:  02/10/2017 abdominal radiograph FINDINGS: Surgical clips and disproportionately sutures are noted in the medial upper abdomen bilaterally, unchanged. Enteric tube terminates in the distal stomach. No disproportionately dilated small bowel loops. Retained contrast in the large bowel, slightly decreased. No evidence of pneumatosis or pneumoperitoneum. Clear lung bases. No radiopaque urolithiasis. IMPRESSION: Enteric tube terminates in the distal stomach. Nonobstructive bowel gas pattern. Slight decrease in retained  contrast in the large bowel. Electronically Signed   By: Ilona Sorrel [name]   On: 02/11/2017 08:58    Anti-infectives: Anti-infectives    Start     Dose/Rate Route Frequency Ordered Stop   02/12/17 0900  cefTRIAXone (ROCEPHIN) 1 g in dextrose 5 % 50 mL IVPB     1 g 100 mL/hr over 30 Minutes Intravenous Every 24 hours 02/12/17 0838     02/11/17 1430  erythromycin 100 mg in sodium chloride 0.9 % 100 mL IVPB  Status:  Discontinued     100 mg 100 mL/hr over 60 Minutes Intravenous Every 6 hours  02/11/17 1422 02/11/17 1435       Assessment/Plan HLD Depresion HTN  Ileus S/P LOA 8/15 by Dr. Hoxworth/ Intussusception  - NGT placed 8/25 - 850cc output/24hr - patient had 2 small BMs yesterday, passing small amount of flatus  FEN - IVF, NGT to LIWS VTE - SCDs, lovenox ID - rocephin 8/28>>day#2 for UTI, Ucx pending  Plan: Bowel function may be returning, NG tube output still high but decreasing. Keep NG tube to LIWS for this morning and continue ambulating. Continue reglan. Will need TPN 8/30 if unable to advance diet by then.   LOS: 6 days    Wellington [place] , [facility] Surgery 02/13/2017, 8:43 AM Pager: [phone] Consults: [phone] Mon-Fri 7:00 am-4:30 pm Sat-Sun 7:00 am-11:30 am

## 2017-02-13 NOTE — Progress Notes (Signed)
PROGRESS NOTE  [name]  VFI:[id number] DOB: 1946-05-02 DOA: 02/07/2017 PCP: Cassandria Anger, MD  Brief Narrative:   [name] is an 71 y.o. female with recent admission for small bowel obstruction secondary to jejunojejunal intussusception who was discharged 3 days prior to admission returns for recurrent abdominal pain nausea and vomiting, and decreased flatus.  CT demonstrated ileus vs. Low grade SBO.  NG placed for comfort on 8/25 with copious bilious and borderline feculent output. Persistently hypoactive BS.  Had a very small BM today.  Trying reglan and suppositories.     Assessment & Plan:   Active Problems:   Adjustment disorder with mixed anxiety and depressed mood   Essential hypertension   GERD   Restless legs syndrome (RLS)   SBO (small bowel obstruction) (HCC)   Ventricular trigeminy  Suspect post-operative ileus.  Passing flatus and had small BM this AM, but still hypoactive BS -  Continue IVF , continue NG tube, output improving -  Keep electrolytes wnl:  Magnesium low so will supplement and potassium wnl -  General surgery  wants to hold off on TPN until 8/30 -  KUB:  Contrast in the colon, improved bowel distension compared to prior -  Continue reglan -  Daily bisacodyl suppositories -  Placed PICC line for possible TPN tomorrow  EPIGASTRIC PAIN -  Continue protonix BID  BIGEMINY, fewer runs of SVT since starting metoprolol.  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  -  asymptomatic   -  Keep  potassium greater than 4.0 and magnesium greater than 2.0 -  Continue scheduled metoprolol IV   HTN, blood pressures stable to mildly elevated -  Continue metoprolol IV -  Resume verapamil 240 daily once diet advanced  DEPRESSION Venlafaxine, Xanax, trazodone and bupropion are being held as patient is nothing by mouth. - continue IV lorazepam for management of anxiety and/or withdrawal.  HL Lovastatin being held  Dysuria -  UA  positive -  Urine culture pending -  Ceftriaxone started 8/28   DVT prophylaxis: Lovenox. Code Status: Full code. Family Communication: Patient alone with family friend at bedside Disposition Plan: Home in a few days  Consultants:   General surgery  Procedures:  NG placed on 8/25  Antimicrobials:  Anti-infectives    Start     Dose/Rate Route Frequency Ordered Stop   02/12/17 0900  cefTRIAXone (ROCEPHIN) 1 g in dextrose 5 % 50 mL IVPB     1 g 100 mL/hr over 30 Minutes Intravenous Every 24 hours 02/12/17 0838     02/11/17 1430  erythromycin 100 mg in sodium chloride 0.9 % 100 mL IVPB  Status:  Discontinued     100 mg 100 mL/hr over 60 Minutes Intravenous Every 6 hours 02/11/17 1422 02/11/17 1435       Subjective: Patient states that she had 2 small BMs yesterday; one after suppository and one about 3-4 hours later. She passed some flatus yesterday, none this morning. NG tube with 850cc output over last 24 hours   Objective: Vitals:   02/12/17 1411 02/12/17 2144 02/13/17 0311 02/13/17 0532  BP: (!) 155/80 (!) 151/69 (!) 155/73 (!) 157/76  Pulse: 91 65 78 85  Resp: 18 18 16 16   Temp: 97.8 F (36.6 C) 98.7 F (37.1 C) 98.3 F (36.8 C) 98.2 F (36.8 C)  TempSrc: Oral Oral Oral Oral  SpO2: 94% 96% 96% 94%  Weight:      Height:  Intake/Output Summary (Last 24 hours) at 02/13/17 0910 Last data filed at 02/13/17 0909  Gross per 24 hour  Intake             1315 ml  Output             1200 ml  Net              115 ml   Filed Weights   02/11/17 0300 02/12/17 0404 02/12/17 0900  Weight: 90.7 kg (199 lb 14.4 oz) 90.1 kg (198 lb 9.6 oz) 90.1 kg (198 lb 10.2 oz)    Examination:  General exam:  Adult female.  No acute distress.  HEENT:  NCAT, MMM Respiratory system: Clear to auscultation bilaterally Cardiovascular system: Regular rate and rhythm, normal S1/S2. No murmurs, rubs, gallops or clicks.  Warm extremities Gastrointestinal system:  Hypoactive bowel  sounds, soft, mildly distended, nontender. MSK:  Normal tone and bulk, no lower extremity edema Neuro:  Grossly intact  Data Reviewed: I have personally reviewed following labs and imaging studies  CBC:  Recent Labs Lab 02/07/17 0846 02/08/17 0437 02/09/17 0347  WBC 12.2* 8.0 6.7  HGB 13.3 11.7* 13.1  HCT 41.4 37.2 41.9  MCV 91.4 93.0 92.9  PLT 283 236 875   Basic Metabolic Panel:  Recent Labs Lab 02/07/17 0846 02/07/17 1540 02/08/17 0437 02/09/17 0347 02/12/17 0352  NA 141  --  142 139 142  K 4.2  --  4.1 4.1 4.0  CL 105  --  111 105 106  CO2 26  --  26 27 29   GLUCOSE 119*  --  113* 109* 113*  BUN 13  --  10 8 <5*  CREATININE 0.73  --  0.72 0.70 0.70  CALCIUM 9.3  --  8.5* 8.5* 8.9  MG  --  2.0  --   --  1.8  PHOS  --   --   --   --  4.4   GFR: Estimated Creatinine Clearance: 65.3 mL/min (by C-G formula based on SCr of 0.7 mg/dL). Liver Function Tests:  Recent Labs Lab 02/07/17 0846 02/08/17 0437 02/12/17 0352  AST 19 15  --   ALT 19 15  --   ALKPHOS 75 61  --   BILITOT 1.1 1.0  --   PROT 7.1 5.7*  --   ALBUMIN 4.2 3.2* 3.3*    Recent Labs Lab 02/07/17 0846  LIPASE 22   No results for input(s): AMMONIA in the last 168 hours. Coagulation Profile: No results for input(s): INR, PROTIME in the last 168 hours. Cardiac Enzymes: No results for input(s): CKTOTAL, CKMB, CKMBINDEX, TROPONINI in the last 168 hours. BNP (last 3 results) No results for input(s): PROBNP in the last 8760 hours. HbA1C: No results for input(s): HGBA1C in the last 72 hours. CBG: No results for input(s): GLUCAP in the last 168 hours. Lipid Profile: No results for input(s): CHOL, HDL, LDLCALC, TRIG, CHOLHDL, LDLDIRECT in the last 72 hours. Thyroid Function Tests: No results for input(s): TSH, T4TOTAL, FREET4, T3FREE, THYROIDAB in the last 72 hours. Anemia Panel: No results for input(s): VITAMINB12, FOLATE, FERRITIN, TIBC, IRON, RETICCTPCT in the last 72 hours. Urine  analysis:    Component Value Date/Time   COLORURINE YELLOW (A) 02/12/2017 0554   APPEARANCEUR CLEAR 02/12/2017 0554   LABSPEC 1.010 02/12/2017 0554   PHURINE 8.5 (H) 02/12/2017 0554   GLUCOSEU NEGATIVE 02/12/2017 0554   GLUCOSEU NEGATIVE 01/16/2017 1011   HGBUR NEGATIVE 02/12/2017 0554   BILIRUBINUR NEGATIVE  02/12/2017 0554   BILIRUBINUR neg 01/26/2017 1031   KETONESUR NEGATIVE 02/12/2017 0554   PROTEINUR 30 (A) 02/12/2017 0554   UROBILINOGEN 0.2 01/26/2017 1031   UROBILINOGEN 0.2 01/16/2017 1011   NITRITE NEGATIVE 02/12/2017 0554   LEUKOCYTESUR NEGATIVE 02/12/2017 0554   Sepsis Labs: @LABRCNTIP (procalcitonin:4,lacticidven:4)  ) No results found for this or any previous visit (from the past 240 hour(s)).    Radiology Studies: Dg Abd Portable 1v  Result Date: 02/12/2017 CLINICAL DATA:  Ileus, morbid obesity EXAM: PORTABLE ABDOMEN - 1 VIEW COMPARISON:  Abdomen films of 02/11/2017 FINDINGS: NG tube curls in the right upper quadrant probably within the duodenum bulb. Multiple surgical clips and sutures are present near the gastroesophageal junction. The bowel gas pattern is nonspecific. Some contrast is present within the nondistended colon. IMPRESSION: NG tube tip probably within duodenal bulb. Nonspecific bowel gas pattern. Electronically Signed   By: Ivar Drape [name]   On: 02/12/2017 10:33     Scheduled Meds: . bisacodyl  10 mg Rectal Daily  . enoxaparin (LOVENOX) injection  40 mg Subcutaneous Q24H  . fluticasone  2 spray Each Nare Daily  . metoCLOPramide (REGLAN) injection  5 mg Intravenous Q8H  . metoprolol tartrate  5 mg Intravenous Q6H  . pantoprazole (PROTONIX) IV  40 mg Intravenous Q12H   Continuous Infusions: . cefTRIAXone (ROCEPHIN)  IV 1 g (02/13/17 0823)  . dextrose 5 % and 0.45 % NaCl with KCl 20 mEq/L 100 mL/hr (02/13/17 0823)     LOS: 6 days    Time spent: 30 min    Reyne Dumas, MD [facility] Pager [phone]  If 7PM-7AM, please contact  night-coverage [email or web address] Password [email or web address] 02/13/2017, 9:10 AM

## 2017-02-14 DIAGNOSIS — K567 Ileus, unspecified: Secondary | ICD-10-CM

## 2017-02-14 LAB — CBC
HCT: 36.8 % (ref 36.0–46.0)
HEMOGLOBIN: 12.1 g/dL (ref 12.0–15.0)
MCH: 29.5 pg (ref 26.0–34.0)
MCHC: 32.9 g/dL (ref 30.0–36.0)
MCV: 89.8 fL (ref 78.0–100.0)
Platelets: 262 10*3/uL (ref 150–400)
RBC: 4.1 MIL/uL (ref 3.87–5.11)
RDW: 13.3 % (ref 11.5–15.5)
WBC: 8.5 10*3/uL (ref 4.0–10.5)

## 2017-02-14 LAB — COMPREHENSIVE METABOLIC PANEL
ALBUMIN: 3.4 g/dL — AB (ref 3.5–5.0)
ALK PHOS: 78 U/L (ref 38–126)
ALT: 14 U/L (ref 14–54)
ANION GAP: 7 (ref 5–15)
AST: 15 U/L (ref 15–41)
BUN: <5 mg/dL — ABNORMAL LOW (ref 6–20)
CO2: 25 mmol/L (ref 22–32)
CREATININE: 0.72 mg/dL (ref 0.44–1.00)
Calcium: 8.7 mg/dL — ABNORMAL LOW (ref 8.9–10.3)
Chloride: 108 mmol/L (ref 101–111)
GFR calc Af Amer: >60 mL/min (ref >60)
GFR calc non Af Amer: >60 mL/min (ref >60)
Glucose, Bld: 110 mg/dL — ABNORMAL HIGH (ref 65–99)
Potassium: 3.9 mmol/L (ref 3.5–5.1)
SODIUM: 140 mmol/L (ref 135–145)
TOTAL PROTEIN: 6.2 g/dL — AB (ref 6.5–8.1)
Total Bilirubin: 0.9 mg/dL (ref 0.3–1.2)

## 2017-02-14 LAB — URINE CULTURE

## 2017-02-14 NOTE — Progress Notes (Signed)
[facility] Progress Note     Subjective: CC: ileus Patient reports 2 BMs yesterday, passing flatus. Denies nausea or vomiting. Denies abdominal pain. 780cc NGT output, patient states she had 1/2 cup ice yesterday.   Objective: Vital signs in last 24 hours: Temp:  [97.7 F (36.5 C)-98 F (36.7 C)] 98 F (36.7 C) (08/30 0517) Pulse Rate:  [78-94] 79 (08/30 0517) Resp:  [18-20] 20 (08/30 0517) BP: (137-158)/(66-70) 140/70 (08/30 0517) SpO2:  [93 %-94 %] 93 % (08/30 0517) Weight:  [88.8 kg (195 lb 12.8 oz)] 88.8 kg (195 lb 12.8 oz) (08/30 0621) Last BM Date: 02/13/17  Intake/Output from previous day: 08/29 0701 - 08/30 0700 In: 2420 [P.O.:120; I.V.:2300] Out: 1580 [Urine:800; Emesis/NG output:780] Intake/Output this shift: No intake/output data recorded.  PE: Gen:  Alert, NAD, pleasant Card:  Regular rate and rhythm Pulm:  Normal effort, clear to auscultation bilaterally Abd: Soft, non-tender, non-distended, bowel sounds slightly hypoactive, no HSM, incisions C/D/I Skin: warm and dry, no rashes  Psych: A&Ox3   Lab Results:   Recent Labs  02/14/17 0501  WBC 8.5  HGB 12.1  HCT 36.8  PLT 262   BMET  Recent Labs  02/12/17 0352 02/14/17 0501  NA 142 140  K 4.0 3.9  CL 106 108  CO2 29 25  GLUCOSE 113* 110*  BUN <5* <5*  CREATININE 0.70 0.72  CALCIUM 8.9 8.7*   PT/INR No results for input(s): LABPROT, INR in the last 72 hours. CMP     Component Value Date/Time   NA 140 02/14/2017 0501   K 3.9 02/14/2017 0501   CL 108 02/14/2017 0501   CO2 25 02/14/2017 0501   GLUCOSE 110 (H) 02/14/2017 0501   BUN <5 (L) 02/14/2017 0501   CREATININE 0.72 02/14/2017 0501   CALCIUM 8.7 (L) 02/14/2017 0501   PROT 6.2 (L) 02/14/2017 0501   ALBUMIN 3.4 (L) 02/14/2017 0501   AST 15 02/14/2017 0501   ALT 14 02/14/2017 0501   ALKPHOS 78 02/14/2017 0501   BILITOT 0.9 02/14/2017 0501   GFRNONAA >60 02/14/2017 0501   GFRAA >60 02/14/2017 0501   Lipase      Component Value Date/Time   LIPASE 22 02/07/2017 0846       Studies/Results: Dg Abd Portable 1v  Result Date: 02/12/2017 CLINICAL DATA:  Ileus, morbid obesity EXAM: PORTABLE ABDOMEN - 1 VIEW COMPARISON:  Abdomen films of 02/11/2017 FINDINGS: NG tube curls in the right upper quadrant probably within the duodenum bulb. Multiple surgical clips and sutures are present near the gastroesophageal junction. The bowel gas pattern is nonspecific. Some contrast is present within the nondistended colon. IMPRESSION: NG tube tip probably within duodenal bulb. Nonspecific bowel gas pattern. Electronically Signed   By: Ivar Drape [name]   On: 02/12/2017 10:33    Anti-infectives: Anti-infectives    Start     Dose/Rate Route Frequency Ordered Stop   02/12/17 0900  cefTRIAXone (ROCEPHIN) 1 g in dextrose 5 % 50 mL IVPB     1 g 100 mL/hr over 30 Minutes Intravenous Every 24 hours 02/12/17 0838     02/11/17 1430  erythromycin 100 mg in sodium chloride 0.9 % 100 mL IVPB  Status:  Discontinued     100 mg 100 mL/hr over 60 Minutes Intravenous Every 6 hours 02/11/17 1422 02/11/17 1435       Assessment/Plan HLD Depresion HTN  Ileus S/P LOA 8/15 by Dr. Excell Seltzer Intussusception  - NGT placed 8/25 - 780cc output/24hr -  patient had 2 BMs yesterday, passing small amount of flatus  FEN - IVF, NGT to LIWS VTE - SCDs, lovenox ID - rocephin 8/28>>day#2 for UTI, Ucx pending  Plan: Bowel function may be returning, NG tube output still high but decreasing. Clamp NGT and allow patient to have sips of clears around tube, may be able to discontinue NGT this afternoon. Continue reglan. Will need TPN ordered if unable to advance diet to clears later today.  LOS: 7 days    Brigid Re , [facility] 02/14/2017, 8:01 AM Pager: [phone] Consults: [phone] Mon-Fri 7:00 am-4:30 pm Sat-Sun 7:00 am-11:30 am

## 2017-02-14 NOTE — Progress Notes (Signed)
Per Delynn Flavin, RN patient is tolerating clear liquids hold off on placing PICC at present, reassess 8-31.  Carolee Rota, RN VAST

## 2017-02-14 NOTE — Progress Notes (Signed)
PROGRESS NOTE  [name]  ZMO:[id number] DOB: 1945/12/01 DOA: 02/07/2017 PCP: Cassandria Anger, MD  Brief Narrative:   [name] is an 71 y.o. female with recent admission for small bowel obstruction secondary to jejunojejunal intussusception who was discharged 3 days prior to admission returns for recurrent abdominal pain nausea and vomiting, and decreased flatus.  CT demonstrated ileus vs. Low grade SBO.  NG placed for comfort on 8/25 with copious bilious and borderline feculent output. Persistently hypoactive BS.  Had a very small BM today.  Trying reglan and suppositories.     Assessment & Plan:   Active Problems:   Adjustment disorder with mixed anxiety and depressed mood   Essential hypertension   GERD   Restless legs syndrome (RLS)   SBO (small bowel obstruction) (HCC)   Ventricular trigeminy   Dehydration   Ileus (HCC)  Suspect post-operative ileus.  Passing flatus and had small BM this AM, but still hypoactive BS -  Continue IVF ,  ,  780cc NGT output. Plan is to clamp the NG tube and see the patient can tolerate clear liquids -  Keep electrolytes wnl:  Magnesium low so will supplement and potassium wnl -  Hold  off on TPN until 8/30 -  KUB:  Contrast in the colon, improved bowel distension compared to prior -  Continue reglan -  Daily bisacodyl suppositories  Will need TPN ordered if unable to advance diet to clears later today.   EPIGASTRIC PAIN -  Continue protonix BID  BIGEMINY/SVT, resolved, continue metoprolol.  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  -  asymptomatic   -  Keep  potassium greater than 4.0 and magnesium greater than 2.0 -  Continue scheduled metoprolol IV   HTN, blood pressures stable to mildly elevated -  Continue metoprolol IV -  Resume verapamil 240 daily once diet advanced  DEPRESSION Venlafaxine, Xanax, trazodone and bupropion are being held as patient is nothing by mouth. - continue IV lorazepam for management of  anxiety and/or withdrawal.  Dyslipidemia Lovastatin being held  Dysuria -  UA positive -  Urine culture pending -  Ceftriaxone started 8/28   DVT prophylaxis: Lovenox. Code Status: Full code. Family Communication: Patient alone with family friend at bedside Disposition Plan: Home in a few days   Consultants:   General surgery  Procedures:  NG placed on 8/25  Antimicrobials:  Anti-infectives    Start     Dose/Rate Route Frequency Ordered Stop   02/12/17 0900  cefTRIAXone (ROCEPHIN) 1 g in dextrose 5 % 50 mL IVPB     1 g 100 mL/hr over 30 Minutes Intravenous Every 24 hours 02/12/17 0838     02/11/17 1430  erythromycin 100 mg in sodium chloride 0.9 % 100 mL IVPB  Status:  Discontinued     100 mg 100 mL/hr over 60 Minutes Intravenous Every 6 hours 02/11/17 1422 02/11/17 1435       Subjective: Denies any significant nausea since NGT has been clamped    Objective: Vitals:   02/13/17 2227 02/14/17 0013 02/14/17 0517 02/14/17 0621  BP: 137/66 (!) 154/69 140/70   Pulse: 78 94 79   Resp: 18  20   Temp: 97.7 F (36.5 C)  98 F (36.7 C)   TempSrc: Oral     SpO2: 94%  93%   Weight:    88.8 kg (195 lb 12.8 oz)  Height:        Intake/Output Summary (Last  24 hours) at 02/14/17 1252 Last data filed at 02/14/17 9326  Gross per 24 hour  Intake             2420 ml  Output             1480 ml  Net              940 ml   Filed Weights   02/12/17 0404 02/12/17 0900 02/14/17 0621  Weight: 90.1 kg (198 lb 9.6 oz) 90.1 kg (198 lb 10.2 oz) 88.8 kg (195 lb 12.8 oz)    Examination:  General exam:  Adult female.  No acute distress.  HEENT:  NCAT, MMM Respiratory system: Clear to auscultation bilaterally Cardiovascular system: Regular rate and rhythm, normal S1/S2. No murmurs, rubs, gallops or clicks.  Warm extremities Gastrointestinal system:  Hypoactive bowel sounds, soft, mildly distended, nontender. MSK:  Normal tone and bulk, no lower extremity edema Neuro:   Grossly intact  Data Reviewed: I have personally reviewed following labs and imaging studies  CBC:  Recent Labs Lab 02/08/17 0437 02/09/17 0347 02/14/17 0501  WBC 8.0 6.7 8.5  HGB 11.7* 13.1 12.1  HCT 37.2 41.9 36.8  MCV 93.0 92.9 89.8  PLT 236 205 712   Basic Metabolic Panel:  Recent Labs Lab 02/07/17 1540 02/08/17 0437 02/09/17 0347 02/12/17 0352 02/14/17 0501  NA  --  142 139 142 140  K  --  4.1 4.1 4.0 3.9  CL  --  111 105 106 108  CO2  --  26 27 29 25   GLUCOSE  --  113* 109* 113* 110*  BUN  --  10 8 <5* <5*  CREATININE  --  0.72 0.70 0.70 0.72  CALCIUM  --  8.5* 8.5* 8.9 8.7*  MG 2.0  --   --  1.8  --   PHOS  --   --   --  4.4  --    GFR: Estimated Creatinine Clearance: 64.7 mL/min (by C-G formula based on SCr of 0.72 mg/dL). Liver Function Tests:  Recent Labs Lab 02/08/17 0437 02/12/17 0352 02/14/17 0501  AST 15  --  15  ALT 15  --  14  ALKPHOS 61  --  78  BILITOT 1.0  --  0.9  PROT 5.7*  --  6.2*  ALBUMIN 3.2* 3.3* 3.4*   No results for input(s): LIPASE, AMYLASE in the last 168 hours. No results for input(s): AMMONIA in the last 168 hours. Coagulation Profile: No results for input(s): INR, PROTIME in the last 168 hours. Cardiac Enzymes: No results for input(s): CKTOTAL, CKMB, CKMBINDEX, TROPONINI in the last 168 hours. BNP (last 3 results) No results for input(s): PROBNP in the last 8760 hours. HbA1C: No results for input(s): HGBA1C in the last 72 hours. CBG: No results for input(s): GLUCAP in the last 168 hours. Lipid Profile: No results for input(s): CHOL, HDL, LDLCALC, TRIG, CHOLHDL, LDLDIRECT in the last 72 hours. Thyroid Function Tests: No results for input(s): TSH, T4TOTAL, FREET4, T3FREE, THYROIDAB in the last 72 hours. Anemia Panel: No results for input(s): VITAMINB12, FOLATE, FERRITIN, TIBC, IRON, RETICCTPCT in the last 72 hours. Urine analysis:    Component Value Date/Time   COLORURINE YELLOW (A) 02/12/2017 0554    APPEARANCEUR CLEAR 02/12/2017 0554   LABSPEC 1.010 02/12/2017 0554   PHURINE 8.5 (H) 02/12/2017 0554   GLUCOSEU NEGATIVE 02/12/2017 [place] 01/16/2017 [place] 02/12/2017 0554   BILIRUBINUR NEGATIVE 02/12/2017 0554  BILIRUBINUR neg 01/26/2017 1031   KETONESUR NEGATIVE 02/12/2017 0554   PROTEINUR 30 (A) 02/12/2017 0554   UROBILINOGEN 0.2 01/26/2017 1031   UROBILINOGEN 0.2 01/16/2017 1011   NITRITE NEGATIVE 02/12/2017 0554   LEUKOCYTESUR NEGATIVE 02/12/2017 0554   Sepsis Labs: @LABRCNTIP (procalcitonin:4,lacticidven:4)  ) Recent Results (from the past 240 hour(s))  Culture, Urine     Status: Abnormal   Collection Time: 02/12/17  5:54 AM  Result Value Ref Range Status   Specimen Description URINE, RANDOM  Final   Special Requests NONE  Final   Culture MULTIPLE SPECIES PRESENT, SUGGEST RECOLLECTION (A)  Final   Report Status 02/14/2017 FINAL  Final      Radiology Studies: No results found.   Scheduled Meds: . bisacodyl  10 mg Rectal Daily  . enoxaparin (LOVENOX) injection  40 mg Subcutaneous Q24H  . fluticasone  2 spray Each Nare Daily  . metoCLOPramide (REGLAN) injection  5 mg Intravenous Q8H  . metoprolol tartrate  5 mg Intravenous Q6H  . pantoprazole (PROTONIX) IV  40 mg Intravenous Q12H   Continuous Infusions: . cefTRIAXone (ROCEPHIN)  IV Stopped (02/14/17 0936)  . dextrose 5 % and 0.45 % NaCl with KCl 20 mEq/L 100 mL/hr at 02/14/17 0600     LOS: 7 days    Time spent: 30 min    Reyne Dumas, MD [facility] Pager [phone]  If 7PM-7AM, please contact night-coverage [email or web address] Password [email or web address] 02/14/2017, 12:52 PM

## 2017-02-15 LAB — PHOSPHORUS: Phosphorus: 3.8 mg/dL (ref 2.5–4.6)

## 2017-02-15 LAB — BASIC METABOLIC PANEL
Anion gap: 9 (ref 5–15)
BUN: <5 mg/dL — ABNORMAL LOW (ref 6–20)
CO2: 23 mmol/L (ref 22–32)
Calcium: 8.8 mg/dL — ABNORMAL LOW (ref 8.9–10.3)
Chloride: 108 mmol/L (ref 101–111)
Creatinine, Ser: 0.65 mg/dL (ref 0.44–1.00)
GFR calc non Af Amer: >60 mL/min (ref >60)
Glucose, Bld: 104 mg/dL — ABNORMAL HIGH (ref 65–99)
POTASSIUM: 4 mmol/L (ref 3.5–5.1)
SODIUM: 140 mmol/L (ref 135–145)

## 2017-02-15 LAB — MAGNESIUM: MAGNESIUM: 1.8 mg/dL (ref 1.7–2.4)

## 2017-02-15 MED ORDER — OXYCODONE HCL 5 MG PO TABS
5.0000 mg | ORAL_TABLET | Freq: Four times a day (QID) | ORAL | Status: DC | PRN
  Administered 2017-02-16 – 2017-02-17 (×2): 5 mg via ORAL
  Filled 2017-02-15 (×2): qty 1

## 2017-02-15 MED ORDER — SIMETHICONE 80 MG PO CHEW: 80.0000 mg | CHEWABLE_TABLET | Freq: Four times a day (QID) | ORAL | Status: DC | PRN

## 2017-02-15 MED ORDER — MORPHINE SULFATE (PF) 2 MG/ML IV SOLN: 2.0000 mg | Freq: Four times a day (QID) | INTRAVENOUS | Status: DC | PRN

## 2017-02-15 NOTE — Progress Notes (Signed)
PROGRESS NOTE  [name]  JJO:[id number] DOB: [birth year] DOA: 02/07/2017 PCP: Cassandria Anger, MD  Brief Narrative:   [name] is an 71 y.o. female with recent admission for small bowel obstruction secondary to jejunojejunal intussusception who was discharged 3 days prior to admission returns for recurrent abdominal pain nausea and vomiting, and decreased flatus.  CT demonstrated ileus vs. Low grade SBO.  NG placed for comfort on 8/25 with copious bilious and borderline feculent output. Persistently hypoactive BS.   Started on Reglan. Very slow improvement over the last 8 days. Surgery discontinued NG tube 8/31.  Assessment & Plan:   Active Problems:   Adjustment disorder with mixed anxiety and depressed mood   Essential hypertension   GERD   Restless legs syndrome (RLS)   SBO (small bowel obstruction) (HCC)   Ventricular trigeminy   Dehydration   Ileus (HCC)  Suspect post-operative ileus.  Passing flatus and had small BM this AM, but still hypoactive BS -  Continue IVF ,  , NG tube clamped overnight, tolerated clear liquids, NG tube now being discontinued, advance to full liquid diet Continue Reglan -  Keep electrolytes wnl:   Recheck magnesium, potassium -  PICC line did not need to be placed in anticipation of  TPN   -  KUB:  Contrast in the colon, improved bowel distension compared to prior -  Continue reglan -  Daily bisacodyl suppositories     EPIGASTRIC PAIN -  Continue protonix BID  BIGEMINY/SVT, resolved, continue metoprolol.  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  -  asymptomatic   -  Keep  potassium greater than 4.0 and magnesium greater than 2.0 -  Continue scheduled metoprolol IV   HTN, blood pressures stable to mildly elevated -  Continue metoprolol IV -  Resume verapamil 240 daily once diet advanced  DEPRESSION Venlafaxine, Xanax, trazodone and bupropion are being held  - continue IV lorazepam for management of anxiety and/or  withdrawal.  Dyslipidemia Lovastatin being held  Dysuria -  UA positive -  Urine culture nonspecific -  Ceftriaxone   8/28 -8/31   DVT prophylaxis: Lovenox. Code Status: Full code. Family Communication: Patient alone with family friend at bedside Disposition Plan:  anticipate discharge tomorrow if stable   Consultants:   General surgery  Procedures:  NG placed on 8/25  Antimicrobials:  Anti-infectives    Start     Dose/Rate Route Frequency Ordered Stop   02/12/17 0900  cefTRIAXone (ROCEPHIN) 1 g in dextrose 5 % 50 mL IVPB  Status:  Discontinued     1 g 100 mL/hr over 30 Minutes Intravenous Every 24 hours 02/12/17 0838 02/15/17 0904   02/11/17 1430  erythromycin 100 mg in sodium chloride 0.9 % 100 mL IVPB  Status:  Discontinued     100 mg 100 mL/hr over 60 Minutes Intravenous Every 6 hours 02/11/17 1422 02/11/17 1435       Subjective: NGT dc;s , Still has intermittent abdominal cramping g    Objective: Vitals:   02/14/17 1447 02/14/17 2101 02/15/17 0015 02/15/17 0624  BP: (!) 161/72 140/71 130/75 (!) 152/82  Pulse: 80 78 81 88  Resp: 16 16 17 16   Temp: 98.6 F (37 C) (!) 97.3 F (36.3 C)  (!) 97.5 F (36.4 C)  TempSrc: Oral Oral  Oral  SpO2: 94% 96% 94% 96%  Weight:    88.6 kg (195 lb 6.4 oz)  Height:  Intake/Output Summary (Last 24 hours) at 02/15/17 0905 Last data filed at 02/15/17 0600  Gross per 24 hour  Intake             1160 ml  Output                0 ml  Net             1160 ml   Filed Weights   02/12/17 0900 02/14/17 0621 02/15/17 0624  Weight: 90.1 kg (198 lb 10.2 oz) 88.8 kg (195 lb 12.8 oz) 88.6 kg (195 lb 6.4 oz)    Examination:  General exam:  Adult female.  No acute distress.  HEENT:  NCAT, MMM Respiratory system: Clear to auscultation bilaterally Cardiovascular system: Regular rate and rhythm, normal S1/S2. No murmurs, rubs, gallops or clicks.  Warm extremities Gastrointestinal system:  Hypoactive bowel sounds, soft,  mildly distended, nontender. MSK:  Normal tone and bulk, no lower extremity edema Neuro:  Grossly intact  Data Reviewed: I have personally reviewed following labs and imaging studies  CBC:  Recent Labs Lab 02/09/17 0347 02/14/17 0501  WBC 6.7 8.5  HGB 13.1 12.1  HCT 41.9 36.8  MCV 92.9 89.8  PLT 205 470   Basic Metabolic Panel:  Recent Labs Lab 02/09/17 0347 02/12/17 0352 02/14/17 0501  NA 139 142 140  K 4.1 4.0 3.9  CL 105 106 108  CO2 27 29 25   GLUCOSE 109* 113* 110*  BUN 8 <5* <5*  CREATININE 0.70 0.70 0.72  CALCIUM 8.5* 8.9 8.7*  MG  --  1.8  --   PHOS  --  4.4  --    GFR: Estimated Creatinine Clearance: 64.7 mL/min (by C-G formula based on SCr of 0.72 mg/dL). Liver Function Tests:  Recent Labs Lab 02/12/17 0352 02/14/17 0501  AST  --  15  ALT  --  14  ALKPHOS  --  78  BILITOT  --  0.9  PROT  --  6.2*  ALBUMIN 3.3* 3.4*   No results for input(s): LIPASE, AMYLASE in the last 168 hours. No results for input(s): AMMONIA in the last 168 hours. Coagulation Profile: No results for input(s): INR, PROTIME in the last 168 hours. Cardiac Enzymes: No results for input(s): CKTOTAL, CKMB, CKMBINDEX, TROPONINI in the last 168 hours. BNP (last 3 results) No results for input(s): PROBNP in the last 8760 hours. HbA1C: No results for input(s): HGBA1C in the last 72 hours. CBG: No results for input(s): GLUCAP in the last 168 hours. Lipid Profile: No results for input(s): CHOL, HDL, LDLCALC, TRIG, CHOLHDL, LDLDIRECT in the last 72 hours. Thyroid Function Tests: No results for input(s): TSH, T4TOTAL, FREET4, T3FREE, THYROIDAB in the last 72 hours. Anemia Panel: No results for input(s): VITAMINB12, FOLATE, FERRITIN, TIBC, IRON, RETICCTPCT in the last 72 hours. Urine analysis:    Component Value Date/Time   COLORURINE YELLOW (A) 02/12/2017 0554   APPEARANCEUR CLEAR 02/12/2017 0554   LABSPEC 1.010 02/12/2017 0554   PHURINE 8.5 (H) 02/12/2017 0554   GLUCOSEU  NEGATIVE 02/12/2017 0554   GLUCOSEU NEGATIVE 01/16/2017 1011   HGBUR NEGATIVE 02/12/2017 0554   BILIRUBINUR NEGATIVE 02/12/2017 0554   BILIRUBINUR neg 01/26/2017 1031   KETONESUR NEGATIVE 02/12/2017 0554   PROTEINUR 30 (A) 02/12/2017 0554   UROBILINOGEN 0.2 01/26/2017 1031   UROBILINOGEN 0.2 01/16/2017 1011   NITRITE NEGATIVE 02/12/2017 0554   LEUKOCYTESUR NEGATIVE 02/12/2017 0554   Sepsis Labs: @LABRCNTIP (procalcitonin:4,lacticidven:4)  ) Recent Results (from the past 240 hour(s))  Culture, Urine     Status: Abnormal   Collection Time: 02/12/17  5:54 AM  Result Value Ref Range Status   Specimen Description URINE, RANDOM  Final   Special Requests NONE  Final   Culture MULTIPLE SPECIES PRESENT, SUGGEST RECOLLECTION (A)  Final   Report Status 02/14/2017 FINAL  Final      Radiology Studies: No results found.   Scheduled Meds: . bisacodyl  10 mg Rectal Daily  . enoxaparin (LOVENOX) injection  40 mg Subcutaneous Q24H  . fluticasone  2 spray Each Nare Daily  . metoCLOPramide (REGLAN) injection  5 mg Intravenous Q8H  . metoprolol tartrate  5 mg Intravenous Q6H  . pantoprazole (PROTONIX) IV  40 mg Intravenous Q12H   Continuous Infusions: . dextrose 5 % and 0.45 % NaCl with KCl 20 mEq/L 100 mL/hr at 02/15/17 0109     LOS: 8 days    Time spent: 30 min    Reyne Dumas, MD [facility] Pager [phone]  If 7PM-7AM, please contact night-coverage [email or web address] Password [email or web address] 02/15/2017, 9:05 AM

## 2017-02-15 NOTE — Progress Notes (Signed)
Patient ID: [name], female   DOB: Oct 17, 1945, 71 y.o.   MRN: [id number]  [facility] Surgery Progress Note     Subjective: CC- ileus Patient states she feels great this morning. Passing flatus and had another small BM this morning. Did well with clear liquids. Denies n/v.  Objective: Vital signs in last 24 hours: Temp:  [97.3 F (36.3 C)-98.6 F (37 C)] 97.5 F (36.4 C) (08/31 0624) Pulse Rate:  [78-88] 88 (08/31 0624) Resp:  [16-17] 16 (08/31 0624) BP: (130-161)/(71-82) 152/82 (08/31 0624) SpO2:  [94 %-96 %] 96 % (08/31 0624) Weight:  [195 lb 6.4 oz (88.6 kg)] 195 lb 6.4 oz (88.6 kg) (08/31 0624) Last BM Date: 02/14/17  Intake/Output from previous day: 08/30 0701 - 08/31 0700 In: 1160 [P.O.:360; I.V.:800] Out: 0  Intake/Output this shift: No intake/output data recorded.  PE: Gen: Alert, NAD, pleasant HEENT: EOM's intact, pupils equal and round Card: RRR, no M/G/R heard Pulm: CTAB, no W/R/R, effort normal Abd: Soft, ND, nontender, +BS in all 4 quadrants, no HSM, no hernia, lap incisions C/D/I Psych: A&Ox3  Skin: no rashes noted, warm and dry   Lab Results:   Recent Labs  02/14/17 0501  WBC 8.5  HGB 12.1  HCT 36.8  PLT 262   BMET  Recent Labs  02/14/17 0501  NA 140  K 3.9  CL 108  CO2 25  GLUCOSE 110*  BUN <5*  CREATININE 0.72  CALCIUM 8.7*   PT/INR No results for input(s): LABPROT, INR in the last 72 hours. CMP     Component Value Date/Time   NA 140 02/14/2017 0501   K 3.9 02/14/2017 0501   CL 108 02/14/2017 0501   CO2 25 02/14/2017 0501   GLUCOSE 110 (H) 02/14/2017 0501   BUN <5 (L) 02/14/2017 0501   CREATININE 0.72 02/14/2017 0501   CALCIUM 8.7 (L) 02/14/2017 0501   PROT 6.2 (L) 02/14/2017 0501   ALBUMIN 3.4 (L) 02/14/2017 0501   AST 15 02/14/2017 0501   ALT 14 02/14/2017 0501   ALKPHOS 78 02/14/2017 0501   BILITOT 0.9 02/14/2017 0501   GFRNONAA >60 02/14/2017 0501   GFRAA >60 02/14/2017 0501   Lipase     Component  Value Date/Time   LIPASE 22 02/07/2017 0846       Studies/Results: No results found.  Anti-infectives: Anti-infectives    Start     Dose/Rate Route Frequency Ordered Stop   02/12/17 0900  cefTRIAXone (ROCEPHIN) 1 g in dextrose 5 % 50 mL IVPB     1 g 100 mL/hr over 30 Minutes Intravenous Every 24 hours 02/12/17 0838     02/11/17 1430  erythromycin 100 mg in sodium chloride 0.9 % 100 mL IVPB  Status:  Discontinued     100 mg 100 mL/hr over 60 Minutes Intravenous Every 6 hours 02/11/17 1422 02/11/17 1435       Assessment/Plan HLD Depresion HTN  Ileus S/P LOA 8/15 by Dr. Excell Seltzer Intussusception  - POD 16 - passing flatus, BM this AM  FEN - decrease IVF, full liquids VTE - SCDs, lovenox ID - rocephin 8/28>>day#47for UTI Foley - none Follow up - Dr. Excell Seltzer  Plan: Discontinue NG tube. Advance to fulls liquids. Continue reglan. Continue ambulating. Check labs.     LOS: 8 days    Wellington Hampshire , [facility] 02/15/2017, 8:12 AM Pager: [phone] Consults: [phone] Mon-Fri 7:00 am-4:30 pm Sat-Sun 7:00 am-11:30 am

## 2017-02-15 NOTE — Discharge Instructions (Signed)

## 2017-02-16 LAB — BASIC METABOLIC PANEL
ANION GAP: 9 (ref 5–15)
BUN: 7 mg/dL (ref 6–20)
CHLORIDE: 105 mmol/L (ref 101–111)
CO2: 25 mmol/L (ref 22–32)
Calcium: 9 mg/dL (ref 8.9–10.3)
Creatinine, Ser: 0.72 mg/dL (ref 0.44–1.00)
Glucose, Bld: 108 mg/dL — ABNORMAL HIGH (ref 65–99)
POTASSIUM: 3.6 mmol/L (ref 3.5–5.1)
SODIUM: 139 mmol/L (ref 135–145)

## 2017-02-16 LAB — MAGNESIUM: MAGNESIUM: 1.9 mg/dL (ref 1.7–2.4)

## 2017-02-16 LAB — URINALYSIS, ROUTINE W REFLEX MICROSCOPIC
Bilirubin Urine: NEGATIVE
GLUCOSE, UA: NEGATIVE mg/dL
HGB URINE DIPSTICK: NEGATIVE
Ketones, ur: NEGATIVE mg/dL
NITRITE: NEGATIVE
PROTEIN: 30 mg/dL — AB
Specific Gravity, Urine: 1.026 (ref 1.005–1.030)
pH: 5 (ref 5.0–8.0)

## 2017-02-16 MED ORDER — ALPRAZOLAM 0.5 MG PO TABS: 0.5000 mg | ORAL_TABLET | Freq: Two times a day (BID) | ORAL | Status: DC | PRN

## 2017-02-16 MED ORDER — METOCLOPRAMIDE HCL 5 MG PO TABS
5.0000 mg | ORAL_TABLET | Freq: Three times a day (TID) | ORAL | Status: DC
  Administered 2017-02-16 – 2017-02-17 (×4): 5 mg via ORAL
  Filled 2017-02-16 (×4): qty 1

## 2017-02-16 MED ORDER — GABAPENTIN 100 MG PO CAPS
100.0000 mg | ORAL_CAPSULE | Freq: Every day | ORAL | Status: DC
  Administered 2017-02-16: 100 mg via ORAL
  Filled 2017-02-16: qty 1

## 2017-02-16 MED ORDER — TRAZODONE HCL 50 MG PO TABS
50.0000 mg | ORAL_TABLET | Freq: Every day | ORAL | Status: DC
  Administered 2017-02-16: 50 mg via ORAL
  Filled 2017-02-16: qty 1

## 2017-02-16 MED ORDER — VERAPAMIL HCL ER 240 MG PO TBCR
240.0000 mg | EXTENDED_RELEASE_TABLET | Freq: Every day | ORAL | Status: DC
  Administered 2017-02-16 – 2017-02-17 (×2): 240 mg via ORAL
  Filled 2017-02-16 (×2): qty 1

## 2017-02-16 NOTE — Progress Notes (Signed)
   Subjective/Chief Complaint: No complaints. Passing flatus and having bm's   Objective: Vital signs in last 24 hours: Temp:  [97.9 F (36.6 C)-98.3 F (36.8 C)] 97.9 F (36.6 C) (09/01 0456) Pulse Rate:  [74-90] 90 (09/01 0456) Resp:  [16-18] 18 (09/01 0456) BP: (142-153)/(46-80) 153/80 (09/01 0456) SpO2:  [92 %-97 %] 92 % (09/01 0456) Last BM Date: 02/15/17  Intake/Output from previous day: 08/31 0701 - 09/01 0700 In: 1223.6 [P.O.:600; I.V.:623.6] Out: 200 [Urine:200] Intake/Output this shift: No intake/output data recorded.  General appearance: alert and cooperative Resp: clear to auscultation bilaterally Cardio: regular rate and rhythm GI: soft, nontender  Lab Results:   Recent Labs  02/14/17 0501  WBC 8.5  HGB 12.1  HCT 36.8  PLT 262   BMET  Recent Labs  02/14/17 0501 02/15/17 0938  NA 140 140  K 3.9 4.0  CL 108 108  CO2 25 23  GLUCOSE 110* 104*  BUN <5* <5*  CREATININE 0.72 0.65  CALCIUM 8.7* 8.8*   PT/INR No results for input(s): LABPROT, INR in the last 72 hours. ABG No results for input(s): PHART, HCO3 in the last 72 hours.  Invalid input(s): PCO2, PO2  Studies/Results: No results found.  Anti-infectives: Anti-infectives    Start     Dose/Rate Route Frequency Ordered Stop   02/12/17 0900  cefTRIAXone (ROCEPHIN) 1 g in dextrose 5 % 50 mL IVPB  Status:  Discontinued     1 g 100 mL/hr over 30 Minutes Intravenous Every 24 hours 02/12/17 0838 02/15/17 0904   02/11/17 1430  erythromycin 100 mg in sodium chloride 0.9 % 100 mL IVPB  Status:  Discontinued     100 mg 100 mL/hr over 60 Minutes Intravenous Every 6 hours 02/11/17 1422 02/11/17 1435      Assessment/Plan: s/p * No surgery found * Advance diet. Start soft foods. If she can tolerate then she can be d/c from surgery standpoint. F/u with PCP  LOS: 9 days    TOTH III,Amarrion Pastorino S 02/16/2017

## 2017-02-16 NOTE — Progress Notes (Signed)
PROGRESS NOTE  [name]  VCB:[id number] DOB: 01/29/1946 DOA: 02/07/2017 PCP: Cassandria Anger, MD  Brief Narrative:   [name] is an 71 y.o. female with recent admission for small bowel obstruction secondary to jejunojejunal intussusception who was discharged 3 days prior to admission returns for recurrent abdominal pain nausea and vomiting, and decreased flatus.  CT demonstrated ileus vs. Low grade SBO.  NG placed for comfort on 8/25 with copious bilious and borderline feculent output. Persistently hypoactive BS.   Started on Reglan. Very slow improvement over the last 8 days. Surgery discontinued NG tube 8/31.  Assessment & Plan:   Active Problems:   Adjustment disorder with mixed anxiety and depressed mood   Essential hypertension   GERD   Restless legs syndrome (RLS)   SBO (small bowel obstruction) (HCC)   Ventricular trigeminy   Dehydration   Ileus (HCC)  Suspect post-operative ileus.   -NG removed. Plan to advanced diet  -  KUB:  Contrast in the colon, improved bowel distension compared to prior -  Continue reglan--change to oral.   EPIGASTRIC PAIN -  Continue protonix BID  BIGEMINY/SVT, resolved, .  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  -  Change IV metoprolol to home dose verapamil.   HTN,  -  Resume verapamil 240 daily   DEPRESSION Venlafaxine, Xanax, trazodone and bupropion resume   Dyslipidemia Lovastatin being held  Dysuria -  UA positive -  Urine culture nonspecific -  Ceftriaxone   8/28 -8/31 Complaints of dysuria, supra-pubic pain. Repeat UA, culture.   DVT prophylaxis: Lovenox. Code Status: Full code. Family Communication: Patient alone  Disposition Plan:  anticipate discharge tomorrow if stable   Consultants:   General surgery  Procedures:  NG placed on 8/25  Antimicrobials:  Anti-infectives    Start     Dose/Rate Route Frequency Ordered Stop   02/12/17 0900  cefTRIAXone (ROCEPHIN) 1 g in dextrose 5 % 50 mL IVPB   Status:  Discontinued     1 g 100 mL/hr over 30 Minutes Intravenous Every 24 hours 02/12/17 0838 02/15/17 0904   02/11/17 1430  erythromycin 100 mg in sodium chloride 0.9 % 100 mL IVPB  Status:  Discontinued     100 mg 100 mL/hr over 60 Minutes Intravenous Every 6 hours 02/11/17 1422 02/11/17 1435       Subjective: Complaint of dysuria. Mild abdominal pain.      Objective: Vitals:   02/15/17 2124 02/16/17 0017 02/16/17 0456 02/16/17 1241  BP: (!) 149/72 (!) 142/46 (!) 153/80 (!) 148/82  Pulse: 79 74 90   Resp: 16  18   Temp: 98.3 F (36.8 C)  97.9 F (36.6 C)   TempSrc: Oral  Oral   SpO2: 97%  92%   Weight:      Height:        Intake/Output Summary (Last 24 hours) at 02/16/17 1444 Last data filed at 02/16/17 1003  Gross per 24 hour  Intake              520 ml  Output              200 ml  Net              320 ml   Filed Weights   02/12/17 0900 02/14/17 0621 02/15/17 0624  Weight: 90.1 kg (198 lb 10.2 oz) 88.8 kg (195 lb 12.8 oz) 88.6 kg (195 lb 6.4 oz)    Examination:  General exam:  NAD HEENT:  MMM Respiratory system: normal respiratory effort, CTA Cardiovascular system: s 1, s 2 RRR Gastrointestinal system:  BS present, soft, mild tenderness.  MSK:  Normal tone and bulk, no lower extremity edema Neuro:  Grossly intact  Data Reviewed: I have personally reviewed following labs and imaging studies  CBC:  Recent Labs Lab 02/14/17 0501  WBC 8.5  HGB 12.1  HCT 36.8  MCV 89.8  PLT 644   Basic Metabolic Panel:  Recent Labs Lab 02/12/17 0352 02/14/17 0501 02/15/17 0938 02/16/17 1046  NA 142 140 140 139  K 4.0 3.9 4.0 3.6  CL 106 108 108 105  CO2 29 25 23 25   GLUCOSE 113* 110* 104* 108*  BUN <5* <5* <5* 7  CREATININE 0.70 0.72 0.65 0.72  CALCIUM 8.9 8.7* 8.8* 9.0  MG 1.8  --  1.8 1.9  PHOS 4.4  --  3.8  --    GFR: Estimated Creatinine Clearance: 64.7 mL/min (by C-G formula based on SCr of 0.72 mg/dL). Liver Function Tests:  Recent  Labs Lab 02/12/17 0352 02/14/17 0501  AST  --  15  ALT  --  14  ALKPHOS  --  78  BILITOT  --  0.9  PROT  --  6.2*  ALBUMIN 3.3* 3.4*   No results for input(s): LIPASE, AMYLASE in the last 168 hours. No results for input(s): AMMONIA in the last 168 hours. Coagulation Profile: No results for input(s): INR, PROTIME in the last 168 hours. Cardiac Enzymes: No results for input(s): CKTOTAL, CKMB, CKMBINDEX, TROPONINI in the last 168 hours. BNP (last 3 results) No results for input(s): PROBNP in the last 8760 hours. HbA1C: No results for input(s): HGBA1C in the last 72 hours. CBG: No results for input(s): GLUCAP in the last 168 hours. Lipid Profile: No results for input(s): CHOL, HDL, LDLCALC, TRIG, CHOLHDL, LDLDIRECT in the last 72 hours. Thyroid Function Tests: No results for input(s): TSH, T4TOTAL, FREET4, T3FREE, THYROIDAB in the last 72 hours. Anemia Panel: No results for input(s): VITAMINB12, FOLATE, FERRITIN, TIBC, IRON, RETICCTPCT in the last 72 hours. Urine analysis:    Component Value Date/Time   COLORURINE YELLOW 02/16/2017 1302   APPEARANCEUR HAZY (A) 02/16/2017 1302   LABSPEC 1.026 02/16/2017 1302   PHURINE 5.0 02/16/2017 1302   GLUCOSEU NEGATIVE 02/16/2017 1302   GLUCOSEU NEGATIVE 01/16/2017 1011   HGBUR NEGATIVE 02/16/2017 1302   BILIRUBINUR NEGATIVE 02/16/2017 1302   BILIRUBINUR neg 01/26/2017 1031   KETONESUR NEGATIVE 02/16/2017 1302   PROTEINUR 30 (A) 02/16/2017 1302   UROBILINOGEN 0.2 01/26/2017 1031   UROBILINOGEN 0.2 01/16/2017 1011   NITRITE NEGATIVE 02/16/2017 1302   LEUKOCYTESUR MODERATE (A) 02/16/2017 1302   Sepsis Labs: @LABRCNTIP (procalcitonin:4,lacticidven:4)  ) Recent Results (from the past 240 hour(s))  Culture, Urine     Status: Abnormal   Collection Time: 02/12/17  5:54 AM  Result Value Ref Range Status   Specimen Description URINE, RANDOM  Final   Special Requests NONE  Final   Culture MULTIPLE SPECIES PRESENT, SUGGEST RECOLLECTION  (A)  Final   Report Status 02/14/2017 FINAL  Final      Radiology Studies: No results found.   Scheduled Meds: . bisacodyl  10 mg Rectal Daily  . enoxaparin (LOVENOX) injection  40 mg Subcutaneous Q24H  . fluticasone  2 spray Each Nare Daily  . gabapentin  100 mg Oral QHS  . metoCLOPramide  5 mg Oral TID AC  . pantoprazole (PROTONIX) IV  40 mg  Intravenous Q12H  . traZODone  50 mg Oral QHS  . verapamil  240 mg Oral Daily   Continuous Infusions: . dextrose 5 % and 0.45 % NaCl with KCl 20 mEq/L 1,000 mL (02/15/17 1949)     LOS: 9 days    Time spent: 30 min    Elmarie Shiley, MD [facility] Pager [phone]  If 7PM-7AM, please contact night-coverage [email or web address] Password [email or web address] 02/16/2017, 2:44 PM

## 2017-02-16 NOTE — Progress Notes (Signed)
Dr. Tyrell Antonio aware via phone pt lost IV access. Pt drinking well, no scheduled IV medications on MAR, and plans to dc home tomorrow. See new order received to leave IV out.

## 2017-02-17 LAB — URINE CULTURE: Culture: <10000 — AB

## 2017-02-17 MED ORDER — PANTOPRAZOLE SODIUM 40 MG PO TBEC
40.0000 mg | DELAYED_RELEASE_TABLET | Freq: Two times a day (BID) | ORAL | Status: DC
  Administered 2017-02-17: 40 mg via ORAL
  Filled 2017-02-17: qty 1

## 2017-02-17 MED ORDER — ONDANSETRON HCL 4 MG PO TABS: 4.0000 mg | ORAL_TABLET | Freq: Four times a day (QID) | ORAL | 0 refills | Status: DC | PRN

## 2017-02-17 MED ORDER — METOCLOPRAMIDE HCL 5 MG PO TABS: 5.0000 mg | ORAL_TABLET | Freq: Three times a day (TID) | ORAL | 0 refills | Status: DC

## 2017-02-17 MED ORDER — OXYCODONE HCL 5 MG PO TABS: 5.0000 mg | ORAL_TABLET | Freq: Four times a day (QID) | ORAL | 0 refills | Status: DC | PRN

## 2017-02-17 NOTE — Progress Notes (Signed)
   Subjective/Chief Complaint: Had some cramping after drinking a lot of prune juice. Still having bm's   Objective: Vital signs in last 24 hours: Temp:  [98.3 F (36.8 C)-98.7 F (37.1 C)] 98.7 F (37.1 C) (09/02 0523) Pulse Rate:  [77-87] 87 (09/02 0523) Resp:  [18] 18 (09/02 0523) BP: (118-148)/(62-82) 130/68 (09/02 0523) SpO2:  [94 %-97 %] 94 % (09/02 0523) Weight:  [88.5 kg (195 lb 1.7 oz)] 88.5 kg (195 lb 1.7 oz) (09/02 0523) Last BM Date: 02/16/17  Intake/Output from previous day: 09/01 0701 - 09/02 0700 In: 890 [P.O.:780; I.V.:110] Out: 0  Intake/Output this shift: No intake/output data recorded.  General appearance: alert and cooperative Resp: clear to auscultation bilaterally Cardio: regular rate and rhythm GI: soft, nontender  Lab Results:  No results for input(s): WBC, HGB, HCT, PLT in the last 72 hours. BMET  Recent Labs  02/15/17 0938 02/16/17 1046  NA 140 139  K 4.0 3.6  CL 108 105  CO2 23 25  GLUCOSE 104* 108*  BUN <5* 7  CREATININE 0.65 0.72  CALCIUM 8.8* 9.0   PT/INR No results for input(s): LABPROT, INR in the last 72 hours. ABG No results for input(s): PHART, HCO3 in the last 72 hours.  Invalid input(s): PCO2, PO2  Studies/Results: No results found.  Anti-infectives: Anti-infectives    Start     Dose/Rate Route Frequency Ordered Stop   02/12/17 0900  cefTRIAXone (ROCEPHIN) 1 g in dextrose 5 % 50 mL IVPB  Status:  Discontinued     1 g 100 mL/hr over 30 Minutes Intravenous Every 24 hours 02/12/17 0838 02/15/17 0904   02/11/17 1430  erythromycin 100 mg in sodium chloride 0.9 % 100 mL IVPB  Status:  Discontinued     100 mg 100 mL/hr over 60 Minutes Intravenous Every 6 hours 02/11/17 1422 02/11/17 1435      Assessment/Plan: s/p * No surgery found * Advance diet  sbo seems to have resolved. Will sign off  LOS: 10 days    TOTH III,Chattie Greeson S 02/17/2017

## 2017-02-17 NOTE — Progress Notes (Signed)
PHARMACIST - PHYSICIAN COMMUNICATION  DR:   TRH  CONCERNING: IV to Oral Route Change Policy  RECOMMENDATION: This patient is receiving pantoprazole by the intravenous route.  Based on criteria approved by the Pharmacy and Therapeutics Committee, the intravenous medication(s) is/are being converted to the equivalent oral dose form(s).   DESCRIPTION: These criteria include:  The patient is eating (either orally or via tube) and/or has been taking other orally administered medications for a least 24 hours  The patient has no evidence of active gastrointestinal bleeding or impaired GI absorption (gastrectomy, short bowel, patient on TNA or NPO).  RN asks to change to PO as patient lost IV access  If you have questions about this conversion, please contact the Pharmacy Department  []   [phone] )  [facility] []   [phone] )  [facility] []   [phone] )  [facility] []   [phone] )  [facility] [x]   [phone] )  [place], [place], [facility] 02/17/2017 9:09 AM

## 2017-02-17 NOTE — Progress Notes (Signed)
Assessment unchanged. Pt and daughter verbalized understanding of dc instructions through teach back including follow up care, when to call the doctor, medications to resume and diet. Scripts x 3 given as provided by MD. Discharged via wc to front entrance accompanied by NT and daughter.

## 2017-02-17 NOTE — Discharge Summary (Signed)
Physician Discharge Summary  [name] ZDG:[id number] DOB: January 15, 1946 DOA: 02/07/2017  PCP: Cassandria Anger, MD  Admit date: 02/07/2017 Discharge date: 02/17/2017  Admitted From: home  Disposition: home   Recommendations for Outpatient Follow-up:  1. Follow up with PCP in 1-2 weeks 2. Please obtain BMP/CBC in one week 3. Follow up with surgery    Discharge Condition: Stable.  CODE STATUS: Full code.  Diet recommendation: Heart Healthy   Brief/Interim Summary: Karen Fuhriman Shoreis an 71 y.o.femalewith recent admission for small bowel obstruction secondary to jejunojejunal intussusception who was discharged 3 days prior to admission returns for recurrent abdominal pain nausea and vomiting, and decreased flatus.  CT demonstrated ileus vs. Low grade SBO.  NG placed for comfort on 8/25 with copious bilious and borderline feculent output. Persistently hypoactive BS.   Started on Reglan. Very slow improvement over the last 8 days. Surgery discontinued NG tube 8/31.  Assessment & Plan:   Active Problems:   Adjustment disorder with mixed anxiety and depressed mood   Essential hypertension   GERD   Restless legs syndrome (RLS)   SBO (small bowel obstruction) (HCC)   Ventricular trigeminy   Dehydration   Ileus (HCC)  Suspect post-operative ileus.  recent Jejuno - jejunal intussusception,.SP exploratory laparoscopy with lysis of adhesions on 8:15. Readmitted 8-23 with ileus.  -NG removed. Plan to advanced diet  -  KUB:  Contrast in the colon, improved bowel distension compared to prior -  Continue reglan--change to oral.  -tolerating diet , having bowel movement.   EPIGASTRIC PAIN -  Continue protonix BID  BIGEMINY/SVT, resolved, .  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  - received  IV metoprolol. Change to home dose verapamil.   HTN,  -  Resume verapamil 240 daily   DEPRESSION Venlafaxine, Xanax, trazodone and bupropion resume   Dyslipidemia Lovastatin being  held  Dysuria -  UA positive -  Urine culture nonspecific -  Ceftriaxone   8/28 -8/31 UA with no significant Bacteria  growth , denies dysuria today    Discharge Diagnoses:  Active Problems:   Adjustment disorder with mixed anxiety and depressed mood   Essential hypertension   GERD   Restless legs syndrome (RLS)   SBO (small bowel obstruction) (HCC)   Ventricular trigeminy   Dehydration   Ileus [facility])    Discharge Instructions  Discharge Instructions    Diet - low sodium heart healthy    Complete by:  As directed    Increase activity slowly    Complete by:  As directed      Allergies as of 02/17/2017      Reactions   Levaquin [levofloxacin]    nausea      Medication List    STOP taking these medications   butalbital-acetaminophen-caffeine 50-325-40-30 MG capsule Commonly known as:  FIORICET WITH CODEINE     TAKE these medications   acetaminophen 500 MG tablet Commonly known as:  TYLENOL Take 1,000 mg by mouth every 6 (six) hours as needed for mild pain.   ALIGN 4 MG Caps Take 1 capsule (4 mg total) by mouth daily.   ALPRAZolam 0.5 MG tablet Commonly known as:  XANAX Take 1 tablet (0.5 mg total) by mouth 2 (two) times daily as needed.   aspirin 81 MG tablet Take 81 mg by mouth daily.   buPROPion 150 MG 12 hr tablet Commonly known as:  WELLBUTRIN SR Take 1 tablet by mouth  twice a day   diclofenac  sodium 1 % Gel Commonly known as:  VOLTAREN Apply to large joints up to TID as directed   fish oil-omega-3 fatty acids 1000 MG capsule Take by mouth daily. Take  600 mg daily   fluticasone 50 MCG/ACT nasal spray Commonly known as:  FLONASE Use 2 sprays in each  nostril daily   gabapentin 100 MG capsule Commonly known as:  NEURONTIN Take 1 capsule (100 mg total) by mouth at bedtime. Annual appt due in July must see MD for refills   lovastatin 40 MG tablet Commonly known as:  MEVACOR Take 1 tablet (40 mg total) by mouth at bedtime.   lubiprostone  24 MCG capsule Commonly known as:  AMITIZA Take 1 capsule (24 mcg total) by mouth 2 (two) times daily with a meal.   metoCLOPramide 5 MG tablet Commonly known as:  REGLAN Take 1 tablet (5 mg total) by mouth 3 (three) times daily before meals.   OCUVITE PO Take 2 tablets by mouth daily.   omeprazole 40 MG capsule Commonly known as:  PRILOSEC TAKE 1 CAPSULE BY MOUTH  TWICE DAILY BEFORE MEALS   ondansetron 4 MG tablet Commonly known as:  ZOFRAN Take 1 tablet (4 mg total) by mouth every 6 (six) hours as needed for nausea.   oxyCODONE 5 MG immediate release tablet Commonly known as:  Oxy IR/ROXICODONE Take 1 tablet (5 mg total) by mouth every 6 (six) hours as needed for moderate pain or severe pain. What changed:  when to take this   polyethylene glycol packet Commonly known as:  MIRALAX Take 17 g by mouth daily.   traZODone 50 MG tablet Commonly known as:  DESYREL Take 1 tablet (50 mg total) by mouth at bedtime.   verapamil 240 MG CR tablet Commonly known as:  CALAN-SR TAKE 1 TABLET BY MOUTH AT  BEDTIME. YEARLY PHYSICAL  W/LABS DUE IN JULY MUST SEE MD FOR REFILLS   VESICARE 10 MG tablet Generic drug:  solifenacin Take 10 mg by mouth daily.   vitamin B-12 1000 MCG tablet Commonly known as:  CYANOCOBALAMIN Take 500 mcg by mouth daily.   Vitamin D 2000 units Caps Take 1 capsule by mouth daily.            Discharge Care Instructions        Start     Ordered   02/17/17 0000  metoCLOPramide (REGLAN) 5 MG tablet  3 times daily before meals     02/17/17 1157   02/17/17 0000  ondansetron (ZOFRAN) 4 MG tablet  Every 6 hours PRN     02/17/17 1157   02/17/17 0000  oxyCODONE (OXY IR/ROXICODONE) 5 MG immediate release tablet  Every 6 hours PRN     02/17/17 1157   02/17/17 0000  Increase activity slowly     02/17/17 1157   02/17/17 0000  Diet - low sodium heart healthy     02/17/17 1157     Follow-up Information    Plotnikov, Evie Lacks, MD Follow up in 1 week(s).    Specialty:  Internal Medicine Contact information: [place] [address] [phone]        Excell Seltzer, MD Follow up in 1 week(s).   Specialty:  General Surgery Contact information: [address] [place] [address] [phone]          Allergies  Allergen Reactions  . Levaquin [Levofloxacin]     nausea    Consultations:  Surgery    Procedures/Studies:  Dg Abd 1 View  Result Date: 02/09/2017 CLINICAL DATA:  Status post NG tube placement EXAM: ABDOMEN - 1 VIEW COMPARISON:  02/09/2017 at 0453 hours FINDINGS: Enteric tube likely terminates in the proximal duodenum. Postsurgical changes involving the stomach.  Cholecystectomy clips. IMPRESSION: Enteric tube likely terminates in the proximal duodenum. Electronically Signed   By: Julian Hy [name]   On: 02/09/2017 15:24   Ct Abdomen Pelvis W Contrast  Result Date: 02/07/2017 CLINICAL DATA:  Vomiting, lower abdominal pain. Recent surgery for intussusception. EXAM: CT ABDOMEN AND PELVIS WITH CONTRAST TECHNIQUE: Multidetector CT imaging of the abdomen and pelvis was performed using the standard protocol following bolus administration of intravenous contrast. CONTRAST:  177mL ISOVUE-300 IOPAMIDOL (ISOVUE-300) INJECTION 61% COMPARISON:  01/27/2017 FINDINGS: Lower chest: Lung bases are clear. No effusions. Heart is normal size. Hepatobiliary: Cysts scattered throughout the liver. Prior cholecystectomy. Pancreas: No focal abnormality or ductal dilatation. Spleen: No focal abnormality.  Normal size. Adrenals/Urinary Tract: No adrenal abnormality. No focal renal abnormality. No stones or hydronephrosis. Urinary bladder is unremarkable. Stomach/Bowel: Postoperative changes within the stomach. Mildly prominent proximal and mid small bowel loops. Distal small bowel loops are decompressed. Is difficult to completely exclude low grade partial small bowel obstruction. This could reflect mild ileus. Small  amount amount of interloop fluid and edema noted within the small bowel mesentery in the left abdomen. No visible colonic abnormality. Vascular/Lymphatic: Aortic and iliac calcifications. No aneurysm or adenopathy. Reproductive: Prior hysterectomy.  No adnexal masses. Other: Trace free fluid in the pelvis. Musculoskeletal: No acute bony abnormality. IMPRESSION: Mildly prominent proximal and mid small bowel loops with decompressed distal small bowel. This could reflect focal ileus although low grade small bowel obstruction cannot be excluded. Small amount of interloop free fluid and edema within the small bowel mesentery. Trace free fluid in the pelvis. Hepatic cysts, stable. Aortoiliac atherosclerosis. Electronically Signed   By: Rolm Baptise [name]   On: 02/07/2017 10:47   Ct Abdomen Pelvis W Contrast  Result Date: 01/26/2017 CLINICAL DATA:  LEFT lower quadrant abdominal pain for 1 week, suspected diverticulitis, history colonic diverticulosis, hypertension, GERD, fibromyalgia EXAM: CT ABDOMEN AND PELVIS WITH CONTRAST TECHNIQUE: Multidetector CT imaging of the abdomen and pelvis was performed using the standard protocol following bolus administration of intravenous contrast. Sagittal and coronal MPR images reconstructed from axial data set. CONTRAST:  151mL ISOVUE-300 IOPAMIDOL (ISOVUE-300) INJECTION 61% IV. Dilute oral contrast. COMPARISON:  None available; prior CT from 02/12/2000 predates PACs and is not available for comparison FINDINGS: Lower chest: Lung bases clear Hepatobiliary: Gallbladder surgically absent. Scattered hepatic cysts. No biliary dilatation. Pancreas: Normal appearance Spleen: Normal appearance Adrenals/Urinary Tract: Adrenal glands, kidneys, ureters, and bladder normal appearance Stomach/Bowel: Prior gastric stapling. Segment of jejuno-jejunal intussusception in the upper LEFT pelvis, approximate 3 cm length, without bowel obstruction. Long redundant sigmoid loop. Bowel loops otherwise  unremarkable. No evidence of diverticulitis or definite visualization of colonic diverticula. Appendix surgically absent by history. Vascular/Lymphatic: Atherosclerotic calcifications aorta and iliac arteries without aneurysm. No adenopathy. Reproductive: Uterus surgically absent with nonvisualization of ovaries Other: No free air or free fluid. No inflammatory process or hernia. Musculoskeletal: Osseous demineralization.  No acute bony findings. IMPRESSION: Segment of jejuno-jejunal intussusception in the LEFT pelvis without evidence of bowel obstruction ; no lead point is visualized. Hepatic cysts. No other significant intra-abdominal or intrapelvic abnormalities. Aortic Atherosclerosis (ICD10-I70.0). Electronically Signed   By: Lavonia Dana [name]   On: 01/26/2017 12:46   Ct Entero Abd/pelvis W Contast  Result Date:  01/27/2017 CLINICAL DATA:  Left groin pain and back pain. History of prior jejunal intussusception on CT. EXAM: CT ABDOMEN AND PELVIS WITH CONTRAST (ENTEROGRAPHY) TECHNIQUE: Multidetector CT of the abdomen and pelvis during bolus administration of intravenous contrast. Negative oral contrast was given. CONTRAST:  < 100 cc ISOVUE-300 IOPAMIDOL (ISOVUE-300) INJECTION 61% COMPARISON:  CT scan 01/26/2017 FINDINGS: Lower chest: The lung bases are grossly clear. Patchy areas of subsegmental atelectasis are noted. No pleural effusion. The heart is mildly enlarged but stable. Mild dilatation of the distal esophagus and surgical changes at the GE junction. Hepatobiliary: Stable hepatic cysts. No worrisome hepatic lesions. The gallbladder is surgically absent. No common bile duct dilatation. Pancreas: No mass, inflammation or ductal dilatation. Moderate atrophy. Spleen: Normal size.  No focal lesions. Adrenals/Urinary Tract: The adrenal glands and kidneys are unremarkable. No renal, ureteral or bladder calculi or mass. Stomach/Bowel: The contrast on the prior CT scan has moved into the colon. No findings for  obstruction. Surgical changes involving the stomach. The duodenum, small bowel and colon are grossly normal. No acute inflammatory process, mass lesions or obstructive findings. No intussusception is demonstrated. The terminal ileum appears normal. Vascular/Lymphatic: Stable aortic calcifications without aneurysm or dissection. The branch vessels are patent. The major venous structures are patent. No mesenteric or retroperitoneal mass or lymphadenopathy. Reproductive: Surgically absent.  Mild cystocele. Other: No pelvic mass or adenopathy. No free pelvic fluid collections. No inguinal mass or adenopathy. No abdominal wall hernia or subcutaneous lesions. Musculoskeletal: No significant bony findings. IMPRESSION: No acute abdominal/ pelvic findings, mass lesions or adenopathy. No findings for small bowel obstruction or acute inflammatory bowel process. No intussusception is demonstrated. Electronically Signed   By: Marijo Sanes [name]   On: 01/27/2017 15:33   Dg Abd 2 Views  Result Date: 01/27/2017 CLINICAL DATA:  LEFT lower quadrant abdominal pain EXAM: ABDOMEN - 2 VIEW COMPARISON:  CT abdomen and pelvis 01/26/2017 FINDINGS: Retained contrast in colon. Prior gastric bypass procedure. Nonobstructive bowel gas pattern. No bowel dilatation, bowel wall thickening or evidence of obstruction. Osseous structures unremarkable. Few pelvic phleboliths. No urinary tract calcification. IMPRESSION: Nonobstructive bowel gas pattern. Electronically Signed   By: Lavonia Dana [name]   On: 01/27/2017 08:22   Dg Abd Portable 1v  Result Date: 02/12/2017 CLINICAL DATA:  Ileus, morbid obesity EXAM: PORTABLE ABDOMEN - 1 VIEW COMPARISON:  Abdomen films of 02/11/2017 FINDINGS: NG tube curls in the right upper quadrant probably within the duodenum bulb. Multiple surgical clips and sutures are present near the gastroesophageal junction. The bowel gas pattern is nonspecific. Some contrast is present within the nondistended colon.  IMPRESSION: NG tube tip probably within duodenal bulb. Nonspecific bowel gas pattern. Electronically Signed   By: Ivar Drape [name]   On: 02/12/2017 10:33   Dg Abd Portable 1v  Result Date: 02/11/2017 CLINICAL DATA:  Ileus EXAM: PORTABLE ABDOMEN - 1 VIEW COMPARISON:  02/10/2017 abdominal radiograph FINDINGS: Surgical clips and disproportionately sutures are noted in the medial upper abdomen bilaterally, unchanged. Enteric tube terminates in the distal stomach. No disproportionately dilated small bowel loops. Retained contrast in the large bowel, slightly decreased. No evidence of pneumatosis or pneumoperitoneum. Clear lung bases. No radiopaque urolithiasis. IMPRESSION: Enteric tube terminates in the distal stomach. Nonobstructive bowel gas pattern. Slight decrease in retained contrast in the large bowel. Electronically Signed   By: Ilona Sorrel [name]   On: 02/11/2017 08:58   Dg Abd Portable 1v  Result Date: 02/10/2017 CLINICAL DATA:  Continued surveillance of obstruction/ileus. EXAM:  PORTABLE ABDOMEN - 1 VIEW COMPARISON:  02/09/2017. FINDINGS: Nasogastric tube may be coiled in the pylorus. There are no features suggestive of small bowel obstruction on today's portable supine radiograph. Enteric contrast within the colon is redemonstrated, without significant transit. IMPRESSION: No features suggestive of small bowel distention are evident. Electronically Signed   By: Staci Righter [name]   On: 02/10/2017 08:40   Dg Abd Portable 2v  Result Date: 02/09/2017 CLINICAL DATA:  SBO (small bowel obstruction) Hx gastroplasty, cholecystectomy, appendectomy, hysterectomy. EXAM: PORTABLE ABDOMEN - 2 VIEW COMPARISON:  CT 02/07/2017 FINDINGS: Anastomotic staple line in the epigastrium. Cholecystectomy clips. Visualized lung bases clear. No free air on decubitus. A few distended small bowel loops in the mid and lower abdomen, with scattered fluid levels on the decubitus radiograph. Residual contrast material in the  nondilated colon. Regional bones unremarkable. IMPRESSION: 1. Persistent fluid levels in distended small bowel loops suggesting residual distal SBO or ileus. 2. No free air. Electronically Signed   By: Lucrezia Europe [name]   On: 02/09/2017 11:26     Subjective: Feeling well, denies dysuria.   Discharge Exam: Vitals:   02/16/17 2112 02/17/17 0523  BP: 139/70 130/68  Pulse: 77 87  Resp: 18 18  Temp: 98.6 F (37 C) 98.7 F (37.1 C)  SpO2: 97% 94%   Vitals:   02/16/17 1241 02/16/17 1500 02/16/17 2112 02/17/17 0523  BP: (!) 148/82 118/62 139/70 130/68  Pulse:  87 77 87  Resp:  18 18 18   Temp:  98.3 F (36.8 C) 98.6 F (37 C) 98.7 F (37.1 C)  TempSrc:  Oral Oral Oral  SpO2:  95% 97% 94%  Weight:    88.5 kg (195 lb 1.7 oz)  Height:        General: Pt is alert, awake, not in acute distress Cardiovascular: RRR, S1/S2 +, no rubs, no gallops Respiratory: CTA bilaterally, no wheezing, no rhonchi Abdominal: Soft, NT, ND, bowel sounds + Extremities: no edema, no cyanosis    The results of significant diagnostics from this hospitalization (including imaging, microbiology, ancillary and laboratory) are listed below for reference.     Microbiology: Recent Results (from the past 240 hour(s))  Culture, Urine     Status: Abnormal   Collection Time: 02/12/17  5:54 AM  Result Value Ref Range Status   Specimen Description URINE, RANDOM  Final   Special Requests NONE  Final   Culture MULTIPLE SPECIES PRESENT, SUGGEST RECOLLECTION (A)  Final   Report Status 02/14/2017 FINAL  Final  Urine Culture     Status: Abnormal   Collection Time: 02/16/17  1:02 PM  Result Value Ref Range Status   Specimen Description URINE, CLEAN CATCH  Final   Special Requests NONE  Final   Culture (A)  Final    <10,000 COLONIES/mL INSIGNIFICANT GROWTH Performed at [facility], [address] [place], [place] [address]    Report Status 02/17/2017 FINAL  Final     Labs: BNP (last 3 results) No  results for input(s): BNP in the last 8760 hours. Basic Metabolic Panel:  Recent Labs Lab 02/12/17 0352 02/14/17 0501 02/15/17 0938 02/16/17 1046  NA 142 140 140 139  K 4.0 3.9 4.0 3.6  CL 106 108 108 105  CO2 29 25 23 25   GLUCOSE 113* 110* 104* 108*  BUN <5* <5* <5* 7  CREATININE 0.70 0.72 0.65 0.72  CALCIUM 8.9 8.7* 8.8* 9.0  MG 1.8  --  1.8 1.9  PHOS 4.4  --  3.8  --    Liver Function Tests:  Recent Labs Lab 02/12/17 0352 02/14/17 0501  AST  --  15  ALT  --  14  ALKPHOS  --  78  BILITOT  --  0.9  PROT  --  6.2*  ALBUMIN 3.3* 3.4*   No results for input(s): LIPASE, AMYLASE in the last 168 hours. No results for input(s): AMMONIA in the last 168 hours. CBC:  Recent Labs Lab 02/14/17 0501  WBC 8.5  HGB 12.1  HCT 36.8  MCV 89.8  PLT 262   Cardiac Enzymes: No results for input(s): CKTOTAL, CKMB, CKMBINDEX, TROPONINI in the last 168 hours. BNP: Invalid input(s): POCBNP CBG: No results for input(s): GLUCAP in the last 168 hours. D-Dimer No results for input(s): DDIMER in the last 72 hours. Hgb A1c No results for input(s): HGBA1C in the last 72 hours. Lipid Profile No results for input(s): CHOL, HDL, LDLCALC, TRIG, CHOLHDL, LDLDIRECT in the last 72 hours. Thyroid function studies No results for input(s): TSH, T4TOTAL, T3FREE, THYROIDAB in the last 72 hours.  Invalid input(s): FREET3 Anemia work up No results for input(s): VITAMINB12, FOLATE, FERRITIN, TIBC, IRON, RETICCTPCT in the last 72 hours. Urinalysis    Component Value Date/Time   COLORURINE YELLOW 02/16/2017 1302   APPEARANCEUR HAZY (A) 02/16/2017 1302   LABSPEC 1.026 02/16/2017 1302   PHURINE 5.0 02/16/2017 1302   GLUCOSEU NEGATIVE 02/16/2017 1302   GLUCOSEU NEGATIVE 01/16/2017 1011   HGBUR NEGATIVE 02/16/2017 1302   BILIRUBINUR NEGATIVE 02/16/2017 1302   BILIRUBINUR neg 01/26/2017 1031   KETONESUR NEGATIVE 02/16/2017 1302   PROTEINUR 30 (A) 02/16/2017 1302   UROBILINOGEN 0.2 01/26/2017  1031   UROBILINOGEN 0.2 01/16/2017 1011   NITRITE NEGATIVE 02/16/2017 1302   LEUKOCYTESUR MODERATE (A) 02/16/2017 1302   Sepsis Labs Invalid input(s): PROCALCITONIN,  WBC,  LACTICIDVEN Microbiology Recent Results (from the past 240 hour(s))  Culture, Urine     Status: Abnormal   Collection Time: 02/12/17  5:54 AM  Result Value Ref Range Status   Specimen Description URINE, RANDOM  Final   Special Requests NONE  Final   Culture MULTIPLE SPECIES PRESENT, SUGGEST RECOLLECTION (A)  Final   Report Status 02/14/2017 FINAL  Final  Urine Culture     Status: Abnormal   Collection Time: 02/16/17  1:02 PM  Result Value Ref Range Status   Specimen Description URINE, CLEAN CATCH  Final   Special Requests NONE  Final   Culture (A)  Final    <10,000 COLONIES/mL INSIGNIFICANT GROWTH Performed at [facility], [place] [address] [place], [place] [address]    Report Status 02/17/2017 FINAL  Final     Time coordinating discharge: Over 30 minutes  SIGNED:   Elmarie Shiley, MD  [facility] 02/17/2017, 1:58 PM Pager   If 7PM-7AM, please contact night-coverage [email or web address] Password [email or web address]

## 2017-02-19 ENCOUNTER — Telehealth: Payer: Self-pay | Admitting: *Deleted

## 2017-02-19 NOTE — Telephone Encounter (Signed)
Transition Care Management Follow-up Telephone Call   Date discharged? 02/17/17   How have you been since you were released from the hospital? Pt states she is doing alright   Do you understand why you were in the hospital? YES   Do you understand the discharge instructions? YES   Where were you discharged to? Home   Items Reviewed:  Medications reviewed: YES  Allergies reviewed: YES  Dietary changes reviewed: YES  Referrals reviewed: No referral needed   Functional Questionnaire:   Activities of Daily Living (ADLs):   She states she are independent in the following: ambulation, bathing and hygiene, feeding, continence, grooming, toileting and dressing States she doesn't require assistance    Any transportation issues/concerns?: NO   Any patient concerns? NO   Confirmed importance and date/time of follow-up visits scheduled YES, appt 02/27/17  Provider Appointment booked with Dr. Alain Marion  Confirmed with patient if condition begins to worsen call PCP or go to the ER.  Patient was given the office number and encouraged to call back with question or concerns.  : YES

## 2017-02-21 ENCOUNTER — Telehealth: Payer: Self-pay | Admitting: General Surgery

## 2017-02-21 NOTE — Telephone Encounter (Signed)
Her daughter, Donell Beers called.  [name] has been in and out of the hospital following laparoscopic lysis of adhesions.  She was discharged 4 days ago.  Since being home she has been able to keep liquids down but when she eats something solid she has some nausea and vomiting.  She is passing gas but has not had a bowel movement since being home from the hospital.  She is taking Reglan 3 times a day.  I told the daughter to keep her on clear liquids tonight and give her Reglan before going to bed.  Tomorrow morning give her Reglan tablet than 30 minutes after that I told her to try a high calorie protein shake as well as a Dulcolax suppository if she had not moved her bowels.  If she does not improve, I told her to call the office tomorrow morning and it latest around midday so that we can see if she needs to come back to the hospital.

## 2017-02-27 ENCOUNTER — Encounter: Payer: Self-pay | Admitting: Internal Medicine

## 2017-02-27 ENCOUNTER — Ambulatory Visit (INDEPENDENT_AMBULATORY_CARE_PROVIDER_SITE_OTHER): Payer: Medicare Other | Admitting: Internal Medicine

## 2017-02-27 VITALS — BP 124/78 | HR 88 | Temp 97.8°F | Ht 60.5 in | Wt 186.0 lb

## 2017-02-27 DIAGNOSIS — Z23 Encounter for immunization: Secondary | ICD-10-CM | POA: Diagnosis not present

## 2017-02-27 DIAGNOSIS — F4323 Adjustment disorder with mixed anxiety and depressed mood: Secondary | ICD-10-CM | POA: Diagnosis not present

## 2017-02-27 DIAGNOSIS — E538 Deficiency of other specified B group vitamins: Secondary | ICD-10-CM | POA: Diagnosis not present

## 2017-02-27 DIAGNOSIS — I10 Essential (primary) hypertension: Secondary | ICD-10-CM | POA: Diagnosis not present

## 2017-02-27 DIAGNOSIS — K56609 Unspecified intestinal obstruction, unspecified as to partial versus complete obstruction: Secondary | ICD-10-CM | POA: Diagnosis not present

## 2017-02-27 MED ORDER — ONDANSETRON HCL 4 MG PO TABS: 4.0000 mg | ORAL_TABLET | Freq: Four times a day (QID) | ORAL | 1 refills | Status: DC | PRN

## 2017-02-27 MED ORDER — OXYCODONE HCL 5 MG PO TABS: 5.0000 mg | ORAL_TABLET | Freq: Four times a day (QID) | ORAL | 0 refills | Status: DC | PRN

## 2017-02-27 MED ORDER — METOCLOPRAMIDE HCL 5 MG PO TABS: 5.0000 mg | ORAL_TABLET | Freq: Three times a day (TID) | ORAL | 1 refills | Status: DC

## 2017-02-27 NOTE — Assessment & Plan Note (Signed)
psychol ref

## 2017-02-27 NOTE — Progress Notes (Signed)
Subjective:  Patient ID: [name], female    DOB: 02-05-1946  Age: 71 y.o. MRN: [id number]  CC: No chief complaint on file.   HPI [name] presents for post-hosp stay for SBO - NG tube helped. Pt is able to keep food down. Hosp documents reviewed. S/p surgery in 8/18. On Reglan. Passing gas, using Dulcolax supp prn, on Amitiza  "[name] an 71 y.o.femalewith recent admission for small bowel obstruction secondary to jejunojejunal intussusception who was discharged 3 days prior to admission returns for recurrent abdominal pain nausea and vomiting, and decreased flatus. CT demonstrated ileus vs. Low grade SBO. NG placed for comfort on 8/25 with copious bilious and borderline feculent output. Persistently hypoactive BS. Started on Reglan. Very slow improvement over the last 8 days. Surgery discontinued NG tube 8/31.  Assessment & Plan:  Active Problems: Adjustment disorder with mixed anxiety and depressed mood Essential hypertension GERD Restless legs syndrome (RLS) SBO (small bowel obstruction) (HCC) Ventricular trigeminy Dehydration Ileus (HCC)  Suspect post-operative ileus.recent Jejuno - jejunal intussusception,.SP exploratory laparoscopy with lysis of adhesions on 8:15. Readmitted 8-23 with ileus.  -NG removed. Plan to advanced diet  - KUB: Contrast in the colon, improved bowel distension compared to prior - Continue reglan--change to oral.  -tolerating diet , having bowel movement.   EPIGASTRIC PAIN - Continue protonix BID  BIGEMINY/SVT, resolved, . 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  - received  IV metoprolol. Change to home dose verapamil.   HTN,  - Resume verapamil 240 daily   DEPRESSION Venlafaxine, Xanax, trazodone and bupropion resume   Dyslipidemia Lovastatin being held  Dysuria - UA positive - Urine culture nonspecific - Ceftriaxone 8/28 -8/31 UA with no significant Bacteria  growth ,  denies dysuria today "  Outpatient Medications Prior to Visit  Medication Sig Dispense Refill  . acetaminophen (TYLENOL) 500 MG tablet Take 1,000 mg by mouth every 6 (six) hours as needed for mild pain.     Marland Kitchen ALPRAZolam (XANAX) 0.5 MG tablet Take 1 tablet (0.5 mg total) by mouth 2 (two) times daily as needed. 60 tablet 3  . aspirin 81 MG tablet Take 81 mg by mouth daily.      Marland Kitchen buPROPion (WELLBUTRIN SR) 150 MG 12 hr tablet Take 1 tablet by mouth  twice a day 180 tablet 3  . Cholecalciferol (VITAMIN D) 2000 UNITS CAPS Take 1 capsule by mouth daily.      . fish oil-omega-3 fatty acids 1000 MG capsule Take by mouth daily. Take  600 mg daily    . fluticasone (FLONASE) 50 MCG/ACT nasal spray Use 2 sprays in each  nostril daily 48 g 3  . gabapentin (NEURONTIN) 100 MG capsule Take 1 capsule (100 mg total) by mouth at bedtime. Annual appt due in July must see MD for refills 90 capsule 0  . lovastatin (MEVACOR) 40 MG tablet Take 1 tablet (40 mg total) by mouth at bedtime. 90 tablet 1  . lubiprostone (AMITIZA) 24 MCG capsule Take 1 capsule (24 mcg total) by mouth 2 (two) times daily with a meal. 180 capsule 3  . metoCLOPramide (REGLAN) 5 MG tablet Take 1 tablet (5 mg total) by mouth 3 (three) times daily before meals. 30 tablet 0  . Multiple Vitamins-Minerals (OCUVITE PO) Take 2 tablets by mouth daily.    Marland Kitchen omeprazole (PRILOSEC) 40 MG capsule TAKE 1 CAPSULE BY MOUTH  TWICE DAILY BEFORE MEALS 180 capsule 2  . ondansetron (ZOFRAN) 4  MG tablet Take 1 tablet (4 mg total) by mouth every 6 (six) hours as needed for nausea. 20 tablet 0  . oxyCODONE (OXY IR/ROXICODONE) 5 MG immediate release tablet Take 1 tablet (5 mg total) by mouth every 6 (six) hours as needed for moderate pain or severe pain. 10 tablet 0  . polyethylene glycol (MIRALAX) packet Take 17 g by mouth daily. 14 each 0  . Probiotic Product (ALIGN) 4 MG CAPS Take 1 capsule (4 mg total) by mouth daily. 30 capsule 1  . traZODone (DESYREL) 50 MG tablet  Take 1 tablet (50 mg total) by mouth at bedtime. 90 tablet 1  . verapamil (CALAN-SR) 240 MG CR tablet TAKE 1 TABLET BY MOUTH AT  BEDTIME. YEARLY PHYSICAL  W/LABS DUE IN JULY MUST SEE MD FOR REFILLS 90 tablet 0  . VESICARE 10 MG tablet Take 10 mg by mouth daily.     . vitamin B-12 (CYANOCOBALAMIN) 1000 MCG tablet Take 500 mcg by mouth daily.     . diclofenac sodium (VOLTAREN) 1 % GEL Apply to large joints up to TID as directed (Patient not taking: Reported on 02/27/2017) 3 Tube 5   No facility-administered medications prior to visit.     ROS Review of Systems  Constitutional: Negative for activity change, appetite change, chills, fatigue and unexpected weight change.  HENT: Negative for congestion, mouth sores and sinus pressure.   Eyes: Negative for visual disturbance.  Respiratory: Negative for cough and chest tightness.   Gastrointestinal: Positive for abdominal distention and constipation. Negative for blood in stool, diarrhea, nausea and vomiting.  Genitourinary: Negative for difficulty urinating, frequency and vaginal pain.  Musculoskeletal: Negative for back pain and gait problem.  Skin: Negative for pallor and rash.  Neurological: Positive for weakness. Negative for dizziness, tremors, numbness and headaches.  Psychiatric/Behavioral: Negative for confusion, sleep disturbance and suicidal ideas.    Objective:  BP 124/78 (BP Location: Right Arm, Patient Position: Sitting, Cuff Size: Large)   Pulse 88   Temp 97.8 F (36.6 C) (Oral)   Ht 5' 0.5" (1.537 m)   Wt 186 lb (84.4 kg)   SpO2 98%   BMI 35.73 kg/m   BP Readings from Last 3 Encounters:  02/27/17 124/78  02/17/17 130/68  02/03/17 (!) 145/62    Wt Readings from Last 3 Encounters:  02/27/17 186 lb (84.4 kg)  02/17/17 195 lb 1.7 oz (88.5 kg)  01/26/17 206 lb 8 oz (93.7 kg)    Physical Exam  Constitutional: She appears well-developed. No distress.  HENT:  Head: Normocephalic.  Right Ear: External ear normal.    Left Ear: External ear normal.  Nose: Nose normal.  Mouth/Throat: Oropharynx is clear and moist.  Eyes: Pupils are equal, round, and reactive to light. Conjunctivae are normal. Right eye exhibits no discharge. Left eye exhibits no discharge.  Neck: Normal range of motion. Neck supple. No JVD present. No tracheal deviation present. No thyromegaly present.  Cardiovascular: Normal rate, regular rhythm and normal heart sounds.   Pulmonary/Chest: No stridor. No respiratory distress. She has no wheezes.  Abdominal: Soft. Bowel sounds are normal. She exhibits no distension and no mass. There is no tenderness. There is no rebound and no guarding.  Musculoskeletal: She exhibits no edema or tenderness.  Lymphadenopathy:    She has no cervical adenopathy.  Neurological: She displays normal reflexes. No cranial nerve deficit. She exhibits normal muscle tone. Coordination normal.  Skin: No rash noted. No erythema.  Psychiatric: She has a normal mood  and affect. Her behavior is normal. Judgment and thought content normal.  obese Scar is clean  Lab Results  Component Value Date   WBC 8.5 02/14/2017   HGB 12.1 02/14/2017   HCT 36.8 02/14/2017   PLT 262 02/14/2017   GLUCOSE 108 (H) 02/16/2017   CHOL 179 01/16/2017   TRIG 170.0 (H) 01/16/2017   HDL 42.80 01/16/2017   LDLDIRECT 137.2 06/16/2013   LDLCALC 102 (H) 01/16/2017   ALT 14 02/14/2017   AST 15 02/14/2017   NA 139 02/16/2017   K 3.6 02/16/2017   CL 105 02/16/2017   CREATININE 0.72 02/16/2017   BUN 7 02/16/2017   CO2 25 02/16/2017   TSH 1.12 01/16/2017   INR 0.99 01/26/2017    Ct Abdomen Pelvis W Contrast  Result Date: 02/07/2017 CLINICAL DATA:  Vomiting, lower abdominal pain. Recent surgery for intussusception. EXAM: CT ABDOMEN AND PELVIS WITH CONTRAST TECHNIQUE: Multidetector CT imaging of the abdomen and pelvis was performed using the standard protocol following bolus administration of intravenous contrast. CONTRAST:  159mL  ISOVUE-300 IOPAMIDOL (ISOVUE-300) INJECTION 61% COMPARISON:  01/27/2017 FINDINGS: Lower chest: Lung bases are clear. No effusions. Heart is normal size. Hepatobiliary: Cysts scattered throughout the liver. Prior cholecystectomy. Pancreas: No focal abnormality or ductal dilatation. Spleen: No focal abnormality.  Normal size. Adrenals/Urinary Tract: No adrenal abnormality. No focal renal abnormality. No stones or hydronephrosis. Urinary bladder is unremarkable. Stomach/Bowel: Postoperative changes within the stomach. Mildly prominent proximal and mid small bowel loops. Distal small bowel loops are decompressed. Is difficult to completely exclude low grade partial small bowel obstruction. This could reflect mild ileus. Small amount amount of interloop fluid and edema noted within the small bowel mesentery in the left abdomen. No visible colonic abnormality. Vascular/Lymphatic: Aortic and iliac calcifications. No aneurysm or adenopathy. Reproductive: Prior hysterectomy.  No adnexal masses. Other: Trace free fluid in the pelvis. Musculoskeletal: No acute bony abnormality. IMPRESSION: Mildly prominent proximal and mid small bowel loops with decompressed distal small bowel. This could reflect focal ileus although low grade small bowel obstruction cannot be excluded. Small amount of interloop free fluid and edema within the small bowel mesentery. Trace free fluid in the pelvis. Hepatic cysts, stable. Aortoiliac atherosclerosis. Electronically Signed   By: Rolm Baptise [name]   On: 02/07/2017 10:47    Assessment & Plan:   There are no diagnoses linked to this encounter. I have discontinued Ms. Kahan's diclofenac sodium. I am also having her maintain her aspirin, vitamin B-12, Vitamin D, fish oil-omega-3 fatty acids, Multiple Vitamins-Minerals (OCUVITE PO), ALIGN, VESICARE, lubiprostone, fluticasone, buPROPion, omeprazole, verapamil, traZODone, gabapentin, ALPRAZolam, acetaminophen, lovastatin, polyethylene glycol,  metoCLOPramide, ondansetron, and oxyCODONE.  No orders of the defined types were placed in this encounter.    Follow-up: No Follow-up on file.  Walker Kehr, MD

## 2017-02-27 NOTE — Assessment & Plan Note (Signed)
On Verapamil 

## 2017-02-27 NOTE — Assessment & Plan Note (Signed)
Wt Readings from Last 3 Encounters:  02/27/17 186 lb (84.4 kg)  02/17/17 195 lb 1.7 oz (88.5 kg)  01/26/17 206 lb 8 oz (93.7 kg)

## 2017-02-27 NOTE — Assessment & Plan Note (Signed)
On B12 

## 2017-02-27 NOTE — Assessment & Plan Note (Signed)
Meds renewed - Reglan, Zofran, Oxycodone w/caution

## 2017-02-27 NOTE — Progress Notes (Signed)
F/u post-hosp stay for SBO - NG tube helped. Pt is able to keep food down. Hosp documents reviewed. S/p surgery in 8/18. On Reglan. Passing gas, using Dulcolax supp prn  "Ashton Sabine Shoreis an 71 y.o.femalewith recent admission for small bowel obstruction secondary to jejunojejunal intussusception who was discharged 3 days prior to admission returns for recurrent abdominal pain nausea and vomiting, and decreased flatus. CT demonstrated ileus vs. Low grade SBO. NG placed for comfort on 8/25 with copious bilious and borderline feculent output. Persistently hypoactive BS. Started on Reglan. Very slow improvement over the last 8 days. Surgery discontinued NG tube 8/31.  Assessment & Plan:  Active Problems: Adjustment disorder with mixed anxiety and depressed mood Essential hypertension GERD Restless legs syndrome (RLS) SBO (small bowel obstruction) (HCC) Ventricular trigeminy Dehydration Ileus (HCC)  Suspect post-operative ileus.recent Jejuno - jejunal intussusception,.SP exploratory laparoscopy with lysis of adhesions on 8:15. Readmitted 8-23 with ileus.  -NG removed. Plan to advanced diet  - KUB: Contrast in the colon, improved bowel distension compared to prior - Continue reglan--change to oral.  -tolerating diet , having bowel movement.   EPIGASTRIC PAIN - Continue protonix BID  BIGEMINY/SVT, resolved, . 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  - received  IV metoprolol. Change to home dose verapamil.   HTN,  - Resume verapamil 240 daily   DEPRESSION Venlafaxine, Xanax, trazodone and bupropion resume   Dyslipidemia Lovastatin being held  Dysuria - UA positive - Urine culture nonspecific - Ceftriaxone 8/28 -8/31 UA with no significant Bacteria  growth , denies dysuria today "

## 2017-02-28 NOTE — Addendum Note (Signed)
Addended by: Karren Cobble on: 02/28/2017 04:13 PM   Modules accepted: Orders

## 2017-03-05 ENCOUNTER — Telehealth: Payer: Self-pay | Admitting: Internal Medicine

## 2017-03-05 NOTE — Telephone Encounter (Signed)
-----   Message from Merril Abbe, [place] sent at 03/05/2017 12:25 PM EDT ----- FYI:  Your patient is scheduled to see Dr Marlowe Sax on 03/21/17. Pt is aware.  Thank you for the referral.

## 2017-03-20 ENCOUNTER — Other Ambulatory Visit: Payer: Self-pay | Admitting: Internal Medicine

## 2017-03-21 ENCOUNTER — Ambulatory Visit (INDEPENDENT_AMBULATORY_CARE_PROVIDER_SITE_OTHER): Payer: Medicare Other | Admitting: Psychology

## 2017-03-21 DIAGNOSIS — F331 Major depressive disorder, recurrent, moderate: Secondary | ICD-10-CM

## 2017-03-22 ENCOUNTER — Encounter: Payer: Self-pay | Admitting: Internal Medicine

## 2017-03-22 ENCOUNTER — Ambulatory Visit (INDEPENDENT_AMBULATORY_CARE_PROVIDER_SITE_OTHER): Payer: Medicare Other | Admitting: Internal Medicine

## 2017-03-22 DIAGNOSIS — K5900 Constipation, unspecified: Secondary | ICD-10-CM | POA: Insufficient documentation

## 2017-03-22 DIAGNOSIS — E559 Vitamin D deficiency, unspecified: Secondary | ICD-10-CM | POA: Diagnosis not present

## 2017-03-22 DIAGNOSIS — K56609 Unspecified intestinal obstruction, unspecified as to partial versus complete obstruction: Secondary | ICD-10-CM

## 2017-03-22 DIAGNOSIS — K5901 Slow transit constipation: Secondary | ICD-10-CM | POA: Diagnosis not present

## 2017-03-22 DIAGNOSIS — K219 Gastro-esophageal reflux disease without esophagitis: Secondary | ICD-10-CM | POA: Diagnosis not present

## 2017-03-22 MED ORDER — BUPROPION HCL ER (XL) 150 MG PO TB24: 150.0000 mg | ORAL_TABLET | Freq: Every day | ORAL | 3 refills | Status: DC

## 2017-03-22 NOTE — Patient Instructions (Addendum)
Trial of Linzess samples 145 or 290 mcg a day in place of Amitiza      Gluten free trial for 4-6 weeks. OK to use gluten-free bread and gluten-free pasta.    Gluten-Free Diet for Celiac Disease, Adult The gluten-free diet includes all foods that do not contain gluten. Gluten is a protein that is found in wheat, rye, barley, and some other grains. Following the gluten-free diet is the only treatment for people with celiac disease. It helps to prevent damage to the intestines and improves or eliminates the symptoms of celiac disease. Following the gluten-free diet requires some planning. It can be challenging at first, but it gets easier with time and practice. There are more gluten-free options available today than ever before. If you need help finding gluten-free foods or if you have questions, talk with your diet and nutrition specialist (registered dietitian) or your health care provider. What do I need to know about a gluten-free diet?  All fruits, vegetables, and meats are safe to eat and do not contain gluten.  When grocery shopping, start by shopping in the produce, meat, and dairy sections. These sections are more likely to contain gluten-free foods. Then move to the aisles that contain packaged foods if you need to.  Read all food labels. Gluten is often added to foods. Always check the ingredient list and look for warnings, such as "may contain gluten."  Talk with your dietitian or health care provider before taking a gluten-free multivitamin or mineral supplement.  Be aware of gluten-free foods having contact with foods that contain gluten (cross-contamination). This can happen at home and with any processed foods. ? Talk with your health care provider or dietitian about how to reduce the risk of cross-contamination in your home. ? If you have questions about how a food is processed, ask the manufacturer. What key words help to identify gluten? Foods that list any of these key  words on the label usually contain gluten:  Wheat, flour, enriched flour, bromated flour, white flour, durum flour, graham flour, phosphated flour, self-rising flour, semolina, farina, barley (malt), rye, and oats.  Starch, dextrin, modified food starch, or cereal.  Thickening, fillers, or emulsifiers.  Malt flavoring, malt extract, or malt syrup.  Hydrolyzed vegetable protein.  In the [place] packaged foods that are gluten-free are required to be labeled "GF." These foods should be easy to identify and are safe to eat. In the [place] food companies are also required to list common food allergens, including wheat, on their labels. Recommended foods Grains  Amaranth, bean flours, 100% buckwheat flour, corn, millet, nut flours or nut meals, GF oats, quinoa, rice, sorghum, teff, rice wafers, pure cornmeal tortillas, popcorn, and hot cereals made from cornmeal. Hominy, rice, wild rice. Some Asian rice noodles or bean noodles. Arrowroot starch, corn bran, corn flour, corn germ, cornmeal, corn starch, potato flour, potato starch flour, and rice bran. Plain, brown, and sweet rice flours. Rice polish, soy flour, and tapioca starch. Vegetables  All plain fresh, frozen, and canned vegetables. Fruits  All plain fresh, frozen, canned, and dried fruits, and 100% fruit juices. Meats and other protein foods  All fresh beef, pork, poultry, fish, seafood, and eggs. Fish canned in water, oil, brine, or vegetable broth. Plain nuts and seeds, peanut butter. Some lunch meat and some frankfurters. Dried beans, dried peas, and lentils. Dairy  Fresh plain, dry, evaporated, or condensed milk. Cream, butter, sour cream, whipping cream, and most yogurts. Unprocessed cheese, most processed cheeses, some cottage  cheese, some cream cheeses. Beverages  Coffee, tea, most herbal teas. Carbonated beverages and some root beers. Wine, sake, and distilled spirits, such as gin, vodka, and whiskey. Most hard ciders. Fats and  oils  Butter, margarine, vegetable oil, hydrogenated butter, olive oil, shortening, lard, cream, and some mayonnaise. Some commercial salad dressings. Olives. Sweets and desserts  Sugar, honey, some syrups, molasses, jelly, and jam. Plain hard candy, marshmallows, and gumdrops. Pure cocoa powder. Plain chocolate. Custard and some pudding mixes. Gelatin desserts, sorbets, frozen ice pops, and sherbet. Cake, cookies, and other desserts prepared with allowed flours. Some commercial ice creams. Cornstarch, tapioca, and rice puddings. Seasoning and other foods  Some canned or frozen soups. Monosodium glutamate (MSG). Cider, rice, and wine vinegar. Baking soda and baking powder. Cream of tartar. Baking and nutritional yeast. Certain soy sauces made without wheat (ask your dietitian about specific brands that are allowed). Nuts, coconut, and chocolate. Salt, pepper, herbs, spices, flavoring extracts, imitation or artificial flavorings, natural flavorings, and food colorings. Some medicines and supplements. Some lip glosses and other cosmetics. Rice syrups. The items listed may not be a complete list. Talk with your dietitian about what dietary choices are best for you. Foods to avoid Grains  Barley, bran, bulgur, couscous, cracked wheat, [place], farro, graham, malt, matzo, semolina, wheat germ, and all wheat and rye cereals including spelt and kamut. Cereals containing malt as a flavoring, such as rice cereal. Noodles, spaghetti, macaroni, most packaged rice mixes, and all mixes containing wheat, rye, barley, or triticale. Vegetables  Most creamed vegetables and most vegetables canned in sauces. Some commercially prepared vegetables and salads. Fruits  Thickened or prepared fruits and some pie fillings. Some fruit snacks and fruit roll-ups. Meats and other protein foods  Any meat or meat alternative containing wheat, rye, barley, or gluten stabilizers. These are often marinated or packaged meats and  lunch meats. Bread-containing products, such as Swiss steak, croquettes, meatballs, and meatloaf. Most tuna canned in vegetable broth and [place] with hydrolyzed vegetable protein (HVP) injected as part of the basting. [place]. Imitation fish. Eggs in sauces made from ingredients to avoid. Dairy  Commercial chocolate milk drinks and malted milk. Some non-dairy creamers. Any cheese product containing ingredients to avoid. Beverages  Certain cereal beverages. Beer, ale, malted milk, and some root beers. Some hard ciders. Some instant flavored coffees. Some herbal teas made with barley or with barley malt added. Fats and oils  Some commercial salad dressings. Sour cream containing modified food starch. Sweets and desserts  Some toffees. Chocolate-coated nuts (may be rolled in wheat flour) and some commercial candies and candy bars. Most cakes, cookies, donuts, pastries, and other baked goods. Some commercial ice cream. Ice cream cones. Commercially prepared mixes for cakes, cookies, and other desserts. Bread pudding and other puddings thickened with flour. Products containing brown rice syrup made with barley malt enzyme. Desserts and sweets made with malt flavoring. Seasoning and other foods  Some curry powders, some dry seasoning mixes, some gravy extracts, some meat sauces, some ketchups, some prepared mustards, and horseradish. Certain soy sauces. Malt vinegar. Bouillon and bouillon cubes that contain HVP. Some chip dips, and some chewing gum. Yeast extract. Brewer's yeast. Caramel color. Some medicines and supplements. Some lip glosses and other cosmetics. The items listed may not be a complete list. Talk with your dietitian about what dietary choices are best for you. Summary  Gluten is a protein that is found in wheat, rye, barley, and some other grains. The gluten-free diet includes  all foods that do not contain gluten.  If you need help finding gluten-free foods or if you have questions,  talk with your diet and nutrition specialist (registered dietitian) or your health care provider.  Read all food labels. Gluten is often added to foods. Always check the ingredient list and look for warnings, such as "may contain gluten." This information is not intended to replace advice given to you by your health care provider. Make sure you discuss any questions you have with your health care provider. Document Released: 06/04/2005 Document Revised: 03/19/2016 Document Reviewed: 03/19/2016 Elsevier Interactive Patient Education  2018 [employer].

## 2017-03-22 NOTE — Progress Notes (Signed)
Subjective:  Patient ID: [name], female    DOB: 18-Feb-1946  Age: 71 y.o. MRN: [id number]  CC: No chief complaint on file.   HPI ANESHA HACKERT presents for anxiety, SBO, GERD, HTN f/u. On Amitiza, Miralax for constipation: 1-2 BMs per week.  Outpatient Medications Prior to Visit  Medication Sig Dispense Refill  . acetaminophen (TYLENOL) 500 MG tablet Take 1,000 mg by mouth every 6 (six) hours as needed for mild pain.     Marland Kitchen ALPRAZolam (XANAX) 0.5 MG tablet Take 1 tablet (0.5 mg total) by mouth 2 (two) times daily as needed. 60 tablet 3  . aspirin 81 MG tablet Take 81 mg by mouth daily.      Marland Kitchen buPROPion (WELLBUTRIN SR) 150 MG 12 hr tablet Take 1 tablet by mouth  twice a day 180 tablet 3  . Cholecalciferol (VITAMIN D) 2000 UNITS CAPS Take 1 capsule by mouth daily.      . fish oil-omega-3 fatty acids 1000 MG capsule Take by mouth daily. Take  600 mg daily    . fluticasone (FLONASE) 50 MCG/ACT nasal spray Use 2 sprays in each  nostril daily 48 g 3  . gabapentin (NEURONTIN) 100 MG capsule Take 1 capsule (100 mg total) by mouth at bedtime. Annual appt due in July must see MD for refills 90 capsule 0  . lovastatin (MEVACOR) 40 MG tablet Take 1 tablet (40 mg total) by mouth at bedtime. 90 tablet 1  . lubiprostone (AMITIZA) 24 MCG capsule Take 1 capsule (24 mcg total) by mouth 2 (two) times daily with a meal. 180 capsule 3  . metoCLOPramide (REGLAN) 5 MG tablet Take 1 tablet (5 mg total) by mouth 3 (three) times daily before meals. 90 tablet 1  . Multiple Vitamins-Minerals (OCUVITE PO) Take 2 tablets by mouth daily.    Marland Kitchen omeprazole (PRILOSEC) 40 MG capsule TAKE 1 CAPSULE BY MOUTH  TWICE DAILY BEFORE MEALS 180 capsule 1  . ondansetron (ZOFRAN) 4 MG tablet Take 1 tablet (4 mg total) by mouth every 6 (six) hours as needed for nausea. 20 tablet 1  . oxyCODONE (OXY IR/ROXICODONE) 5 MG immediate release tablet Take 1 tablet (5 mg total) by mouth every 6 (six) hours as needed for moderate pain or  severe pain. 20 tablet 0  . polyethylene glycol (MIRALAX) packet Take 17 g by mouth daily. 14 each 0  . Probiotic Product (ALIGN) 4 MG CAPS Take 1 capsule (4 mg total) by mouth daily. 30 capsule 1  . traZODone (DESYREL) 50 MG tablet Take 1 tablet (50 mg total) by mouth at bedtime. 90 tablet 1  . verapamil (CALAN-SR) 240 MG CR tablet TAKE 1 TABLET BY MOUTH AT  BEDTIME. YEARLY PHYSICAL  W/LABS DUE IN JULY MUST SEE MD FOR REFILLS 90 tablet 0  . VESICARE 10 MG tablet Take 10 mg by mouth daily.     . vitamin B-12 (CYANOCOBALAMIN) 1000 MCG tablet Take 500 mcg by mouth daily.      No facility-administered medications prior to visit.     ROS Review of Systems  Constitutional: Negative for activity change, appetite change, chills, fatigue and unexpected weight change.  HENT: Negative for congestion, mouth sores and sinus pressure.   Eyes: Negative for visual disturbance.  Respiratory: Negative for cough and chest tightness.   Gastrointestinal: Positive for constipation. Negative for abdominal pain and nausea.  Genitourinary: Negative for difficulty urinating, frequency and vaginal pain.  Musculoskeletal: Negative for back pain and gait problem.  Skin: Negative for pallor and rash.  Neurological: Negative for dizziness, tremors, weakness, numbness and headaches.  Psychiatric/Behavioral: Negative for confusion and sleep disturbance.    Objective:  BP 132/78   Pulse 74   Temp 98.7 F (37.1 C) (Oral)   Resp 16   Ht 5' 0.5" (1.537 m)   Wt 189 lb (85.7 kg)   SpO2 97%   BMI 36.30 kg/m   BP Readings from Last 3 Encounters:  03/22/17 132/78  02/27/17 124/78  02/17/17 130/68    Wt Readings from Last 3 Encounters:  03/22/17 189 lb (85.7 kg)  02/27/17 186 lb (84.4 kg)  02/17/17 195 lb 1.7 oz (88.5 kg)    Physical Exam  Constitutional: [name] appears well-developed. No distress.  HENT:  Head: Normocephalic.  Right Ear: External ear normal.  Left Ear: External ear normal.  Nose: Nose  normal.  Mouth/Throat: Oropharynx is clear and moist.  Eyes: Pupils are equal, round, and reactive to light. Conjunctivae are normal. Right eye exhibits no discharge. Left eye exhibits no discharge.  Neck: Normal range of motion. Neck supple. No JVD present. No tracheal deviation present. No thyromegaly present.  Cardiovascular: Normal rate, regular rhythm and normal heart sounds.   Pulmonary/Chest: No stridor. No respiratory distress. [name] has no wheezes.  Abdominal: Soft. Bowel sounds are normal. [name] exhibits no distension and no mass. There is no tenderness. There is no rebound and no guarding.  Musculoskeletal: [name] exhibits no edema or tenderness.  Lymphadenopathy:    [name] has no cervical adenopathy.  Neurological: [name] displays normal reflexes. No cranial nerve deficit. [name] exhibits normal muscle tone. Coordination normal.  Skin: No rash noted. No erythema.  Psychiatric: [name] has a normal mood and affect. Her behavior is normal. Judgment and thought content normal.    Lab Results  Component Value Date   WBC 8.5 02/14/2017   HGB 12.1 02/14/2017   HCT 36.8 02/14/2017   PLT 262 02/14/2017   GLUCOSE 108 (H) 02/16/2017   CHOL 179 01/16/2017   TRIG 170.0 (H) 01/16/2017   HDL 42.80 01/16/2017   LDLDIRECT 137.2 06/16/2013   LDLCALC 102 (H) 01/16/2017   ALT 14 02/14/2017   AST 15 02/14/2017   NA 139 02/16/2017   K 3.6 02/16/2017   CL 105 02/16/2017   CREATININE 0.72 02/16/2017   BUN 7 02/16/2017   CO2 25 02/16/2017   TSH 1.12 01/16/2017   INR 0.99 01/26/2017    Ct Abdomen Pelvis W Contrast  Result Date: 02/07/2017 CLINICAL DATA:  Vomiting, lower abdominal pain. Recent surgery for intussusception. EXAM: CT ABDOMEN AND PELVIS WITH CONTRAST TECHNIQUE: Multidetector CT imaging of the abdomen and pelvis was performed using the standard protocol following bolus administration of intravenous contrast. CONTRAST:  136mL ISOVUE-300 IOPAMIDOL (ISOVUE-300) INJECTION 61% COMPARISON:  01/27/2017  FINDINGS: Lower chest: Lung bases are clear. No effusions. Heart is normal size. Hepatobiliary: Cysts scattered throughout the liver. Prior cholecystectomy. Pancreas: No focal abnormality or ductal dilatation. Spleen: No focal abnormality.  Normal size. Adrenals/Urinary Tract: No adrenal abnormality. No focal renal abnormality. No stones or hydronephrosis. Urinary bladder is unremarkable. Stomach/Bowel: Postoperative changes within the stomach. Mildly prominent proximal and mid small bowel loops. Distal small bowel loops are decompressed. Is difficult to completely exclude low grade partial small bowel obstruction. This could reflect mild ileus. Small amount amount of interloop fluid and edema noted within the small bowel mesentery in the left abdomen. No visible colonic abnormality. Vascular/Lymphatic: Aortic and iliac calcifications. No aneurysm or adenopathy. Reproductive:  Prior hysterectomy.  No adnexal masses. Other: Trace free fluid in the pelvis. Musculoskeletal: No acute bony abnormality. IMPRESSION: Mildly prominent proximal and mid small bowel loops with decompressed distal small bowel. This could reflect focal ileus although low grade small bowel obstruction cannot be excluded. Small amount of interloop free fluid and edema within the small bowel mesentery. Trace free fluid in the pelvis. Hepatic cysts, stable. Aortoiliac atherosclerosis. Electronically Signed   By: Rolm Baptise [name]   On: 02/07/2017 10:47    Assessment & Plan:   There are no diagnoses linked to this encounter. I am having [name] maintain her aspirin, vitamin B-12, Vitamin D, fish oil-omega-3 fatty acids, Multiple Vitamins-Minerals (OCUVITE PO), ALIGN, VESICARE, lubiprostone, fluticasone, buPROPion, verapamil, traZODone, gabapentin, ALPRAZolam, acetaminophen, lovastatin, polyethylene glycol, ondansetron, oxyCODONE, metoCLOPramide, and omeprazole.  No orders of the defined types were placed in this encounter.    Follow-up:  No Follow-up on file.  Walker Kehr, MD

## 2017-03-22 NOTE — Assessment & Plan Note (Signed)
No relapse 

## 2017-03-22 NOTE — Assessment & Plan Note (Signed)
Prilosec  Potential benefits of a long term PPI use as well as potential risks  and complications were explained to the patient and were aknowledged.

## 2017-03-22 NOTE — Assessment & Plan Note (Signed)
Amitiza, Miralax Trial of Linzess samples 145 or 290 mcg a day in place of Amitiza

## 2017-03-22 NOTE — Assessment & Plan Note (Signed)
On Vit D 

## 2017-03-24 ENCOUNTER — Other Ambulatory Visit: Payer: Self-pay | Admitting: Internal Medicine

## 2017-03-25 ENCOUNTER — Other Ambulatory Visit: Payer: Self-pay | Admitting: *Deleted

## 2017-03-25 MED ORDER — LUBIPROSTONE 24 MCG PO CAPS: ORAL_CAPSULE | ORAL | 3 refills | Status: DC

## 2017-04-02 ENCOUNTER — Encounter: Payer: Self-pay | Admitting: Internal Medicine

## 2017-04-03 MED ORDER — LINACLOTIDE 290 MCG PO CAPS: 290.0000 ug | ORAL_CAPSULE | Freq: Every day | ORAL | 3 refills | Status: DC

## 2017-04-03 NOTE — Telephone Encounter (Signed)
Pt saw MD on 03/22/17 gave samples of Linzess and was told to call back for rx if med help. Sent rx to [facility] for the [place]...Johny Chess

## 2017-04-04 ENCOUNTER — Ambulatory Visit (INDEPENDENT_AMBULATORY_CARE_PROVIDER_SITE_OTHER): Payer: Medicare Other | Admitting: Psychology

## 2017-04-04 DIAGNOSIS — F331 Major depressive disorder, recurrent, moderate: Secondary | ICD-10-CM | POA: Diagnosis not present

## 2017-05-02 ENCOUNTER — Ambulatory Visit: Payer: Medicare Other | Admitting: Psychology

## 2017-05-10 NOTE — Progress Notes (Signed)
Office Visit Note  Patient: [name]             Date of Birth: [birth year]           MRN: [id number]             PCP: Cassandria Anger, MD Referring: Cassandria Anger, MD Visit Date: 05/23/2017 Occupation: @GUAROCC @    Subjective:  Bilateral hand pain   History of Present Illness: [name] is a 71 y.o. female with history of fibromyalgia and osteoarthritis.  Patient states she was hospitalized for about 3 weeks in August 2018 following the diagnosis of intussusception, bowel obstruction, and surgery.  She continues to have occasional nausea.  Her appetite and diet are back to normal.  She continues to be followed by GI (Dr. Henrene Pastor).  She states this has made her feel more fatigued and weak.  She has not been exercising and feels her fibromyalgia has not been as controlled as it was previously.  She continues to have insomnia and takes Trazodone 50 mg 1 tablet nightly.  She states she feels fatigued and has no energy.  She states she is having bilateral hand pain, worse on the left.  She denies any recent injury.  Denies any joint swelling.        Activities of Daily Living:  Patient reports morning stiffness for 30-60 minutes.   Patient Denies nocturnal pain.  Difficulty dressing/grooming: Denies Difficulty climbing stairs: Denies Difficulty getting out of chair: Reports Difficulty using hands for taps, buttons, cutlery, and/or writing: Denies   Review of Systems  Constitutional: Positive for fatigue. Negative for weakness.  HENT: Positive for mouth dryness. Negative for mouth sores and nose dryness.   Eyes: Negative for redness and dryness.  Respiratory: Negative for cough, hemoptysis, shortness of breath and difficulty breathing.   Cardiovascular: Negative.  Negative for chest pain, palpitations, hypertension, irregular heartbeat and swelling in legs/feet.  Gastrointestinal: Positive for nausea. Negative for blood in stool, constipation and diarrhea.  Endocrine:  Negative for increased urination.  Genitourinary: Negative for involuntary urination.  Musculoskeletal: Positive for arthralgias, joint pain, myalgias, morning stiffness and myalgias. Negative for joint swelling, muscle weakness and muscle tenderness.  Skin: Negative for color change, pallor, rash, hair loss, nodules/bumps, redness, skin tightness, ulcers and sensitivity to sunlight.  Neurological: Negative for dizziness, numbness and headaches.  Hematological: Negative for swollen glands.  Psychiatric/Behavioral: Positive for sleep disturbance. Negative for depressed mood. The patient is nervous/anxious.     PMFS History:  Patient Active Problem List   Diagnosis Date Noted  . Constipation 03/22/2017  . Ileus ([place])   . Dehydration   . SBO (small bowel obstruction) ([place]) 02/07/2017  . 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  . Abdominal pain, LLQ 01/26/2017  . Intussusception ([place]) 01/26/2017  . Hearing loss 07/13/2016  . Influenza 09/16/2015  . Headache 12/24/2014  . Neoplasm of uncertain behavior of skin 12/24/2014  . 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 Dysuria 04/07/2014  . Cough 08/25/2013  . Well adult exam 06/16/2013  . Sinusitis, chronic 06/09/2013  . URI, acute 06/09/2012  . Restless legs syndrome (RLS) 01/26/2011  . FINGER PAIN 07/28/2010  . PALPITATIONS 10/18/2008  . Vitamin D deficiency 10/09/2008  . DYSLIPIDEMIA 10/09/2008  . Morbid obesity ([place]) 10/09/2008  . Essential hypertension 10/09/2008  . SYNCOPE 09/27/2008  . UNSPECIFIED EUSTACHIAN TUBE DISORDER 04/09/2008  . Anxiety state 10/07/2007  . INSOMNIA, PERSISTENT 07/08/2007  . Adjustment disorder with mixed anxiety and depressed mood 07/08/2007  .  RHINITIS 07/08/2007  . B12 deficiency 04/07/2007  . ANEMIA, DUE TO BLOOD LOSS 04/07/2007  . OSTEOARTHRITIS 04/07/2007  . GERD 04/04/2007  . Diverticulosis of colon 04/04/2007    Past Medical History:  Diagnosis Date  . Anxiety   . B12 DEFICIENCY   .  Cholecystitis, unspecified   . DEPRESSION   . Diastolic dysfunction   . DIVERTICULOSIS, COLON   . DYSLIPIDEMIA   . Fibromyalgia   . GERD   . GERD (gastroesophageal reflux disease)   . Hyperplastic colon polyp   . HYPERTENSION   . INSOMNIA, PERSISTENT   . Morbid obesity ([place])   . OSTEOARTHRITIS   . PONV (postoperative nausea and vomiting)   . Skin cancer 2018   upper lip   . SYNCOPE   . Ulcer of esophagus   . VITAMIN D DEFICIENCY     Family History  Problem Relation Age of Onset  . Emphysema Father   . Heart disease Father   . Arthritis Mother   . Hypertension Mother        pulmonary  . Heart disease Mother   . Colon cancer Maternal Aunt   . Diabetes Sister   . Lung cancer Sister   . Colon cancer Maternal Uncle   . Colon cancer Other        nephew  . Diabetes Maternal Aunt        x 4  . Diabetes Maternal Uncle        x 3  . Diabetes Paternal Uncle         1  . Kidney disease Other        niece  . Heart Problems Daughter   . Diabetes Daughter   . Esophageal cancer Neg Hx   . Liver disease Neg Hx   . Breast cancer Neg Hx    Past Surgical History:  Procedure Laterality Date  . ABDOMINAL HYSTERECTOMY    . APPENDECTOMY    . BREAST BIOPSY Left 2010   Stero biopsy  . BREAST EXCISIONAL BIOPSY Left 1985   Benign   . CESAREAN SECTION    . CHOLECYSTECTOMY    . GASTROPLASTY    . gatric stapling    . LAPAROSCOPY N/A 01/30/2017   Procedure: LAPAROSCOPY DIAGNOSTIC WITH LAPAROSCOPIC LYSIS OF ADHESIONS;  Surgeon: Excell Seltzer, MD;  Location: [facility];  Service: General;  Laterality: N/A;  . MIDDLE EAR SURGERY Right   . revearsal of gastric stapling    . TONSILLECTOMY    . TOTAL KNEE ARTHROPLASTY     rt.  . TUBAL LIGATION    . TYMPANOPLASTY Right 10/22/2016   Procedure: RIGHT TYMPANOPLASTY;  Surgeon: Izora Gala, MD;  Location: [place];  Service: ENT;  Laterality: Right;   Social History   Social History Narrative  . Not on file      Objective: Vital Signs: BP (!) 128/91 (BP Location: Left Arm, Patient Position: Sitting, Cuff Size: Normal)   Pulse 81   Resp 15   Ht 5' (1.524 m)   Wt 190 lb (86.2 kg)   BMI 37.11 kg/m    Physical Exam  Constitutional: She is oriented to person, place, and time. She appears well-developed and well-nourished.  HENT:  Head: Normocephalic and atraumatic.  Eyes: Conjunctivae and EOM are normal.  Neck: Normal range of motion.  Cardiovascular: Normal rate, regular rhythm, normal heart sounds and intact distal pulses.  Pulmonary/Chest: Effort normal and breath sounds normal.  Abdominal: Soft. Bowel sounds are normal.  Lymphadenopathy:    She has no cervical adenopathy.  Neurological: She is alert and oriented to person, place, and time.  Skin: Skin is warm and dry. Capillary refill takes less than 2 seconds.  Psychiatric: She has a normal mood and affect. Her behavior is normal.  Nursing note and vitals reviewed.    Musculoskeletal Exam: C-spine, thoracic, and lumbar spine good ROM.  C-spine discomfort with ROM.  Shoulder joints, elbow joints, and wrist joints good ROM. Right medial epicondylitis. MCPs, PIPs, and DIPs good ROM with no synovitis.  Mild DIP synovial thickening. CMC joint tenderness with motion and palpation. Hip joints, knee joints, and ankle joints good ROM.  No knee effusion or crepitus.  MTPs, PIPs, and DIPs good ROM with no synovitis.  No greater trochanteric bursitis.    CDAI Exam: No CDAI exam completed.    Investigation: No additional findings.   Imaging: Xr Hand 2 View Left  Result Date: 05/23/2017 PIP, DIP and CMC narrowing was noted. No MCP or intercarpal joint or radiocarpal joint space narrowing was noted. No erosive changes were noted. Impression: These findings are consistent with osteoarthritis of the hand.  Xr Hand 2 View Right  Result Date: 05/23/2017 PIP/DIP and [facility] joint space narrowing was noted. No MCP joint space narrowing or erosive  changes were noted. No intercarpal radiocarpal joint space narrowing was noted. Impression: These findings are consistent with osteoarthritis of the hand.   Speciality Comments: No specialty comments available.    Procedures:  No procedures performed Allergies: Levaquin [levofloxacin]   Assessment / Plan:     Visit Diagnoses: Fibromyalgia: Patient has increased fatigue and insomnia.  No significant generalized hyperalgesia on exam.  Discussed the importance of resuming her usual exercise regimen.  Discussed good sleep hygiene.  Also, refilled Trazodone 50 mg prescription.    Primary osteoarthritis of both hands - CMC joint pain bilaterally.  Mild DIP synovial thickening bilaterally. Prescription provided for left handed CMC brace. She can continue to use voltaren gel as well. Plan: XR Hand 2 View Left, XR Hand 2 View Right  Bilateral hand pain - X-rays of bilateral hands show findings consistent with osteoarthritis.  No erosive changes. Plan: XR Hand 2 View Left, XR Hand 2 View Right  Primary osteoarthritis of both knees: bilateral knees replaced.  Doing very well.    History of total knee replacement, bilateral: Doing well   Primary insomnia - Patient continues to have insomnia.  Refill sent for Trazodone 50 mg. Plan: traZODone (DESYREL) 50 MG tablet  Other medical conditions listed as follows:   History of depression  History of migraine  History of gastroesophageal reflux (GERD)  History of hypercholesterolemia  Restless legs syndrome (RLS)  Diverticulosis of colon    Orders: Orders Placed This Encounter  Procedures  . XR Hand 2 View Left  . XR Hand 2 View Right   Meds ordered this encounter  Medications  . traZODone (DESYREL) 50 MG tablet    Sig: Take 1 tablet (50 mg total) by mouth at bedtime.    Dispense:  90 tablet    Refill:  1    Face-to-face time spent with patient was 30 minutes. Greater than 50% of time was spent in counseling and coordination of  care.  Follow-Up Instructions: Return in about 6 months (around 11/21/2017) for Fibromyalgia, Osteoarthritis.  Bo Merino, MD Note - This record has been created using Editor, commissioning.  Chart creation errors have been sought, but may not always  have been located.  Such creation errors do not reflect on  the standard of medical care.

## 2017-05-12 ENCOUNTER — Other Ambulatory Visit: Payer: Self-pay | Admitting: Internal Medicine

## 2017-05-16 ENCOUNTER — Ambulatory Visit (INDEPENDENT_AMBULATORY_CARE_PROVIDER_SITE_OTHER): Payer: Medicare Other | Admitting: Psychology

## 2017-05-16 DIAGNOSIS — F331 Major depressive disorder, recurrent, moderate: Secondary | ICD-10-CM

## 2017-05-23 ENCOUNTER — Ambulatory Visit (INDEPENDENT_AMBULATORY_CARE_PROVIDER_SITE_OTHER): Payer: Self-pay

## 2017-05-23 ENCOUNTER — Ambulatory Visit: Payer: Medicare Other | Admitting: Rheumatology

## 2017-05-23 ENCOUNTER — Encounter: Payer: Self-pay | Admitting: Rheumatology

## 2017-05-23 VITALS — BP 128/91 | HR 81 | Resp 15 | Ht 60.0 in | Wt 190.0 lb

## 2017-05-23 DIAGNOSIS — G2581 Restless legs syndrome: Secondary | ICD-10-CM | POA: Diagnosis not present

## 2017-05-23 DIAGNOSIS — K5730 Diverticulosis of large intestine without perforation or abscess without bleeding: Secondary | ICD-10-CM | POA: Diagnosis not present

## 2017-05-23 DIAGNOSIS — M170 Bilateral primary osteoarthritis of knee: Secondary | ICD-10-CM

## 2017-05-23 DIAGNOSIS — M79642 Pain in left hand: Secondary | ICD-10-CM | POA: Diagnosis not present

## 2017-05-23 DIAGNOSIS — Z96653 Presence of artificial knee joint, bilateral: Secondary | ICD-10-CM

## 2017-05-23 DIAGNOSIS — Z8659 Personal history of other mental and behavioral disorders: Secondary | ICD-10-CM | POA: Diagnosis not present

## 2017-05-23 DIAGNOSIS — M19042 Primary osteoarthritis, left hand: Secondary | ICD-10-CM

## 2017-05-23 DIAGNOSIS — Z8639 Personal history of other endocrine, nutritional and metabolic disease: Secondary | ICD-10-CM | POA: Diagnosis not present

## 2017-05-23 DIAGNOSIS — F5101 Primary insomnia: Secondary | ICD-10-CM | POA: Diagnosis not present

## 2017-05-23 DIAGNOSIS — Z8719 Personal history of other diseases of the digestive system: Secondary | ICD-10-CM | POA: Diagnosis not present

## 2017-05-23 DIAGNOSIS — Z8669 Personal history of other diseases of the nervous system and sense organs: Secondary | ICD-10-CM | POA: Diagnosis not present

## 2017-05-23 DIAGNOSIS — M19041 Primary osteoarthritis, right hand: Secondary | ICD-10-CM | POA: Diagnosis not present

## 2017-05-23 DIAGNOSIS — M797 Fibromyalgia: Secondary | ICD-10-CM | POA: Diagnosis not present

## 2017-05-23 DIAGNOSIS — M79641 Pain in right hand: Secondary | ICD-10-CM

## 2017-05-23 MED ORDER — TRAZODONE HCL 50 MG PO TABS: 50.0000 mg | ORAL_TABLET | Freq: Every day | ORAL | 1 refills | Status: DC

## 2017-05-30 ENCOUNTER — Ambulatory Visit (INDEPENDENT_AMBULATORY_CARE_PROVIDER_SITE_OTHER): Payer: Medicare Other | Admitting: Psychology

## 2017-05-30 DIAGNOSIS — F331 Major depressive disorder, recurrent, moderate: Secondary | ICD-10-CM

## 2017-06-12 ENCOUNTER — Other Ambulatory Visit: Payer: Self-pay | Admitting: Internal Medicine

## 2017-06-12 ENCOUNTER — Other Ambulatory Visit: Payer: Self-pay | Admitting: Rheumatology

## 2017-06-20 ENCOUNTER — Ambulatory Visit (INDEPENDENT_AMBULATORY_CARE_PROVIDER_SITE_OTHER): Payer: Medicare Other | Admitting: Psychology

## 2017-06-20 DIAGNOSIS — F331 Major depressive disorder, recurrent, moderate: Secondary | ICD-10-CM

## 2017-07-02 ENCOUNTER — Ambulatory Visit: Payer: Self-pay | Admitting: Internal Medicine

## 2017-07-04 ENCOUNTER — Ambulatory Visit (INDEPENDENT_AMBULATORY_CARE_PROVIDER_SITE_OTHER): Payer: Medicare Other | Admitting: Psychology

## 2017-07-04 DIAGNOSIS — F331 Major depressive disorder, recurrent, moderate: Secondary | ICD-10-CM | POA: Diagnosis not present

## 2017-07-10 ENCOUNTER — Ambulatory Visit: Payer: Self-pay | Admitting: Internal Medicine

## 2017-07-17 ENCOUNTER — Ambulatory Visit: Payer: Self-pay | Admitting: Internal Medicine

## 2017-07-21 ENCOUNTER — Other Ambulatory Visit: Payer: Self-pay | Admitting: Internal Medicine

## 2017-07-25 ENCOUNTER — Encounter: Payer: Self-pay | Admitting: Internal Medicine

## 2017-07-25 ENCOUNTER — Ambulatory Visit: Payer: Medicare Other | Admitting: Internal Medicine

## 2017-07-25 DIAGNOSIS — K5901 Slow transit constipation: Secondary | ICD-10-CM

## 2017-07-25 DIAGNOSIS — F4323 Adjustment disorder with mixed anxiety and depressed mood: Secondary | ICD-10-CM | POA: Diagnosis not present

## 2017-07-25 DIAGNOSIS — E538 Deficiency of other specified B group vitamins: Secondary | ICD-10-CM

## 2017-07-25 DIAGNOSIS — E559 Vitamin D deficiency, unspecified: Secondary | ICD-10-CM

## 2017-07-25 DIAGNOSIS — I498 Other specified cardiac arrhythmias: Secondary | ICD-10-CM | POA: Diagnosis not present

## 2017-07-25 DIAGNOSIS — I10 Essential (primary) hypertension: Secondary | ICD-10-CM

## 2017-07-25 DIAGNOSIS — F09 Unspecified mental disorder due to known physiological condition: Secondary | ICD-10-CM | POA: Diagnosis not present

## 2017-07-25 MED ORDER — ALPRAZOLAM 0.5 MG PO TABS: 0.5000 mg | ORAL_TABLET | Freq: Two times a day (BID) | ORAL | 3 refills | Status: DC | PRN

## 2017-07-25 MED ORDER — ZOSTER VAC RECOMB ADJUVANTED 50 MCG/0.5ML IM SUSR: 0.5000 mL | Freq: Once | INTRAMUSCULAR | 1 refills | Status: AC

## 2017-07-25 NOTE — Assessment & Plan Note (Signed)
On Vit B12 

## 2017-07-25 NOTE — Assessment & Plan Note (Signed)
Linzess: add Miralax prn We can try other meds

## 2017-07-25 NOTE — Assessment & Plan Note (Signed)
Wellbutrin

## 2017-07-25 NOTE — Assessment & Plan Note (Signed)
Verapamil 

## 2017-07-25 NOTE — Assessment & Plan Note (Signed)
Resolved F/u w/Cardiology if needed

## 2017-07-25 NOTE — Assessment & Plan Note (Signed)
Vit D 

## 2017-07-25 NOTE — Assessment & Plan Note (Signed)
Doing well 

## 2017-07-25 NOTE — Progress Notes (Signed)
Subjective:  Patient ID: [name], female    DOB: 1945/08/09  Age: 72 y.o. MRN: [id number]  CC: No chief complaint on file.   HPI [name] presents for anxiety, chronic pain f/u. F/u Constipation - goes q 5 d on Linzess  Outpatient Medications Prior to Visit  Medication Sig Dispense Refill  . acetaminophen (TYLENOL) 500 MG tablet Take 1,000 mg by mouth every 6 (six) hours as needed for mild pain.     Marland Kitchen ALPRAZolam (XANAX) 0.5 MG tablet Take 1 tablet (0.5 mg total) by mouth 2 (two) times daily as needed. 60 tablet 3  . aspirin 81 MG tablet Take 81 mg by mouth daily.      Marland Kitchen buPROPion (WELLBUTRIN XL) 150 MG 24 hr tablet Take 1 tablet (150 mg total) by mouth daily. 90 tablet 3  . Cholecalciferol (VITAMIN D) 2000 UNITS CAPS Take 1 capsule by mouth daily.      . fish oil-omega-3 fatty acids 1000 MG capsule Take by mouth daily. Take  600 mg daily    . fluticasone (FLONASE) 50 MCG/ACT nasal spray Place 2 sprays into both nostrils daily. 48 g 1  . gabapentin (NEURONTIN) 100 MG capsule TAKE 1 CAPSULE BY MOUTH AT  BEDTIME 90 capsule 3  . LINZESS 290 MCG CAPS capsule TAKE 1 CAPSULE (290 MCG TOTAL) BY MOUTH DAILY BEFORE BREAKFAST. 30 capsule 3  . lovastatin (MEVACOR) 40 MG tablet Take 1 tablet (40 mg total) by mouth at bedtime. 90 tablet 1  . lubiprostone (AMITIZA) 24 MCG capsule Take 1 capsule by mouth 2 times daily with a meal. 180 capsule 3  . metoCLOPramide (REGLAN) 5 MG tablet Take 1 tablet (5 mg total) by mouth 3 (three) times daily before meals. 90 tablet 1  . Multiple Vitamins-Minerals (OCUVITE PO) Take 2 tablets by mouth daily.    Marland Kitchen omeprazole (PRILOSEC) 40 MG capsule TAKE 1 CAPSULE BY MOUTH  TWICE DAILY BEFORE MEALS 180 capsule 1  . ondansetron (ZOFRAN) 4 MG tablet Take 1 tablet (4 mg total) by mouth every 6 (six) hours as needed for nausea. 20 tablet 1  . oxyCODONE (OXY IR/ROXICODONE) 5 MG immediate release tablet Take 1 tablet (5 mg total) by mouth every 6 (six) hours as needed  for moderate pain or severe pain. 20 tablet 0  . polyethylene glycol (MIRALAX) packet Take 17 g by mouth daily. 14 each 0  . Probiotic Product (ALIGN) 4 MG CAPS Take 1 capsule (4 mg total) by mouth daily. 30 capsule 1  . traZODone (DESYREL) 50 MG tablet Take 1 tablet (50 mg total) by mouth at bedtime. 90 tablet 1  . verapamil (CALAN-SR) 240 MG CR tablet TAKE ONE TABLET BY MOUTH AT BEDTIME 90 tablet 1  . VESICARE 10 MG tablet Take 10 mg by mouth daily.     . vitamin B-12 (CYANOCOBALAMIN) 1000 MCG tablet Take 500 mcg by mouth daily.     Marland Kitchen buPROPion (WELLBUTRIN SR) 150 MG 12 hr tablet Take 1 tablet (150 mg total) by mouth 2 (two) times daily. 180 tablet 3   No facility-administered medications prior to visit.     ROS Review of Systems  Constitutional: Negative for activity change, appetite change, chills, fatigue and unexpected weight change.  HENT: Negative for congestion, mouth sores and sinus pressure.   Eyes: Negative for visual disturbance.  Respiratory: Negative for cough and chest tightness.   Gastrointestinal: Negative for abdominal pain and nausea.  Genitourinary: Negative for difficulty urinating, frequency  and vaginal pain.  Musculoskeletal: Positive for back pain. Negative for gait problem.  Skin: Negative for pallor and rash.  Neurological: Negative for dizziness, tremors, weakness, numbness and headaches.  Psychiatric/Behavioral: Negative for confusion and sleep disturbance. The patient is nervous/anxious.     Objective:  BP 124/78 (BP Location: Left Arm, Patient Position: Sitting, Cuff Size: Large)   Pulse 70   Temp 98.6 F (37 C) (Oral)   Ht 5' (1.524 m)   Wt 191 lb (86.6 kg)   SpO2 99%   BMI 37.30 kg/m   BP Readings from Last 3 Encounters:  07/25/17 124/78  05/23/17 (!) 128/91  03/22/17 132/78    Wt Readings from Last 3 Encounters:  07/25/17 191 lb (86.6 kg)  05/23/17 190 lb (86.2 kg)  03/22/17 189 lb (85.7 kg)    Physical Exam  Constitutional: She  appears well-developed. No distress.  HENT:  Head: Normocephalic.  Right Ear: External ear normal.  Left Ear: External ear normal.  Nose: Nose normal.  Mouth/Throat: Oropharynx is clear and moist.  Eyes: Conjunctivae are normal. Pupils are equal, round, and reactive to light. Right eye exhibits no discharge. Left eye exhibits no discharge.  Neck: Normal range of motion. Neck supple. No JVD present. No tracheal deviation present. No thyromegaly present.  Cardiovascular: Normal rate, regular rhythm and normal heart sounds.  Pulmonary/Chest: No stridor. No respiratory distress. She has no wheezes.  Abdominal: Soft. Bowel sounds are normal. She exhibits no distension and no mass. There is no tenderness. There is no rebound and no guarding.  Musculoskeletal: She exhibits no edema or tenderness.  Lymphadenopathy:    She has no cervical adenopathy.  Neurological: She displays normal reflexes. No cranial nerve deficit. She exhibits normal muscle tone. Coordination normal.  Skin: No rash noted. No erythema.  Psychiatric: She has a normal mood and affect. Her behavior is normal. Judgment and thought content normal.  Obese  Lab Results  Component Value Date   WBC 8.5 02/14/2017   HGB 12.1 02/14/2017   HCT 36.8 02/14/2017   PLT 262 02/14/2017   GLUCOSE 108 (H) 02/16/2017   CHOL 179 01/16/2017   TRIG 170.0 (H) 01/16/2017   HDL 42.80 01/16/2017   LDLDIRECT 137.2 06/16/2013   LDLCALC 102 (H) 01/16/2017   ALT 14 02/14/2017   AST 15 02/14/2017   NA 139 02/16/2017   K 3.6 02/16/2017   CL 105 02/16/2017   CREATININE 0.72 02/16/2017   BUN 7 02/16/2017   CO2 25 02/16/2017   TSH 1.12 01/16/2017   INR 0.99 01/26/2017    Ct Abdomen Pelvis W Contrast  Result Date: 02/07/2017 CLINICAL DATA:  Vomiting, lower abdominal pain. Recent surgery for intussusception. EXAM: CT ABDOMEN AND PELVIS WITH CONTRAST TECHNIQUE: Multidetector CT imaging of the abdomen and pelvis was performed using the standard  protocol following bolus administration of intravenous contrast. CONTRAST:  185mL ISOVUE-300 IOPAMIDOL (ISOVUE-300) INJECTION 61% COMPARISON:  01/27/2017 FINDINGS: Lower chest: Lung bases are clear. No effusions. Heart is normal size. Hepatobiliary: Cysts scattered throughout the liver. Prior cholecystectomy. Pancreas: No focal abnormality or ductal dilatation. Spleen: No focal abnormality.  Normal size. Adrenals/Urinary Tract: No adrenal abnormality. No focal renal abnormality. No stones or hydronephrosis. Urinary bladder is unremarkable. Stomach/Bowel: Postoperative changes within the stomach. Mildly prominent proximal and mid small bowel loops. Distal small bowel loops are decompressed. Is difficult to completely exclude low grade partial small bowel obstruction. This could reflect mild ileus. Small amount amount of interloop fluid and edema noted within  the small bowel mesentery in the left abdomen. No visible colonic abnormality. Vascular/Lymphatic: Aortic and iliac calcifications. No aneurysm or adenopathy. Reproductive: Prior hysterectomy.  No adnexal masses. Other: Trace free fluid in the pelvis. Musculoskeletal: No acute bony abnormality. IMPRESSION: Mildly prominent proximal and mid small bowel loops with decompressed distal small bowel. This could reflect focal ileus although low grade small bowel obstruction cannot be excluded. Small amount of interloop free fluid and edema within the small bowel mesentery. Trace free fluid in the pelvis. Hepatic cysts, stable. Aortoiliac atherosclerosis. Electronically Signed   By: Rolm Baptise [name]   On: 02/07/2017 10:47    Assessment & Plan:   There are no diagnoses linked to this encounter. I am having Margie Ege. Tschirhart maintain her aspirin, vitamin B-12, Vitamin D, fish oil-omega-3 fatty acids, Multiple Vitamins-Minerals (OCUVITE PO), ALIGN, VESICARE, ALPRAZolam, acetaminophen, lovastatin, polyethylene glycol, ondansetron, oxyCODONE, metoCLOPramide, omeprazole,  buPROPion, fluticasone, lubiprostone, gabapentin, traZODone, verapamil, and LINZESS.  No orders of the defined types were placed in this encounter.    Follow-up: No Follow-up on file.  Walker Kehr, MD

## 2017-07-25 NOTE — Assessment & Plan Note (Signed)
Wt Readings from Last 3 Encounters:  07/25/17 191 lb (86.6 kg)  05/23/17 190 lb (86.2 kg)  03/22/17 189 lb (85.7 kg)

## 2017-08-02 ENCOUNTER — Other Ambulatory Visit: Payer: Self-pay | Admitting: Internal Medicine

## 2017-08-10 ENCOUNTER — Other Ambulatory Visit: Payer: Self-pay | Admitting: Internal Medicine

## 2017-08-15 ENCOUNTER — Other Ambulatory Visit: Payer: Self-pay

## 2017-08-15 MED ORDER — LINACLOTIDE 290 MCG PO CAPS: 290.0000 ug | ORAL_CAPSULE | Freq: Every day | ORAL | 1 refills | Status: DC

## 2017-08-16 NOTE — H&P (Signed)
HPI:   [name] is a 72 y.o. female who presents as a consult Patient.   Referring Provider: Janan Halter*  Chief complaint: Hearing loss.  HPI: She has noticed hearing loss on the right for years. It seems to be getting worse. About 8 months ago it got a lot worse. She has never had drainage or pain. No definite known history of trauma to the ear since childhood. She typically does not swim. Otherwise in pretty good health.  PMH/Meds/All/SocHx/FamHx/ROS:   Past Medical History:  Diagnosis Date  . Anxiety  . Arthritis  . GERD (gastroesophageal reflux disease)  . Hyperlipemia  . Hypertension   Past Surgical History:  Procedure Laterality Date  . APPENDECTOMY -25yrs  . BREAST LUMPECTOMY  . cataract surgery  . [place]  . CHOLECYSTECTOMY 2000  . HYSTERECTOMY 1993  . KNEE SURGERY  . TONSILLECTOMY  . TUBAL LIGATION 1985   No family history of bleeding disorders, wound healing problems or difficulty with anesthesia.   Social History   Social History  . Marital status: Married  Spouse name: N/A  . Number of children: N/A  . Years of education: N/A   Occupational History  . Not on file.   Social History Main Topics  . Smoking status: Never Smoker  . Smokeless tobacco: Never Used  . Alcohol use Not on file  . Drug use: Unknown  . Sexual activity: Not on file   Other Topics Concern  . Not on file   Social History Narrative  . No narrative on file   Current Outpatient Prescriptions:  . ALPRAZolam (XANAX) 0.5 MG tablet, Take 0.5 mg by mouth nightly as needed for Sleep., Disp: , Rfl:  . aspirin 81 MG EC tablet *ANTIPLATELET*, Take by mouth daily., Disp: , Rfl:  . Bifidobacterium infantis (ALIGN) 4 mg Cap, Take by mouth., Disp: , Rfl:  . buPROPion SR (WELLBUTRIN SR) 150 MG 12 hr tablet, Take 150 mg by mouth 2 times daily., Disp: , Rfl:  . cholecalciferol, vitamin D3, 2,000 unit Cap, Take 1 capsule by mouth., Disp: , Rfl:  .  codeine-butalbital-ASA-caff (BUTALBITAL-ASPIRIN-CAFFEINE-CODEINE) capsule, Take 1 capsule by mouth every 4 (four) hours as needed for Pain., Disp: , Rfl:  . cyanocobalamin (VITAMIN B12) 1000 MCG tablet, Take 500 mcg by mouth., Disp: , Rfl:  . diclofenac (VOLTAREN) 0.1 % ophthalmic solution, 1 drop 4 times daily., Disp: , Rfl:  . fluticasone (FLONASE) 50 mcg/actuation nasal spray, 2 sprays by Nasal route daily., Disp: , Rfl:  . gabapentin (NEURONTIN) 100 MG capsule, Take 100 mg by mouth 3 times daily., Disp: , Rfl:  . loratadine (CLARITIN) 10 mg tablet, Take by mouth., Disp: , Rfl:  . lovastatin (MEVACOR) 40 MG tablet, Take 40 mg by mouth nightly., Disp: , Rfl:  . lubiprostone (AMITIZA) 24 MCG capsule, Take 24 mcg by mouth 2 times daily with meals., Disp: , Rfl:  . MULTIVIT-MINERALS/FERROUS FUM (MULTI VITAMIN ORAL), Take 2 tablets by mouth., Disp: , Rfl:  . omega-3 acid ethyl esters (LOVAZA) 1 gram capsule, Take by mouth., Disp: , Rfl:  . omeprazole (PRILOSEC) 40 MG capsule, Take 40 mg by mouth daily., Disp: , Rfl:  . PHOS.SERINE/OMEGA-3/DHA/EPA (VAYACOG ORAL), 1 po qd, Disp: , Rfl:  . solifenacin (VESICARE) 10 MG tablet, Take 5 mg by mouth daily., Disp: , Rfl:  . traZODone (DESYREL) 50 MG tablet, Take 50 mg by mouth nightly., Disp: , Rfl:  . verapamil (CALAN-SR) 240 MG SR tablet, Take 240 mg by  mouth nightly., Disp: , Rfl:   A complete ROS was performed with pertinent positives/negatives noted in the HPI. The remainder of the ROS are negative.   Physical Exam:   Ht 1.524 m (5')  Wt 93 kg (205 lb)  BMI 40.04 kg/m   General: Healthy and alert, in no distress, breathing easily. Normal affect. In a pleasant mood. Head: Normocephalic, atraumatic. No masses, or scars. Eyes: Pupils are equal, and reactive to light. Vision is grossly intact. No spontaneous or gaze nystagmus. Ears: Ear canals are clear. Left tympanic membrane intact with a clear middle ear. Right tympanic membrane with a large  anterior perforation with clean edges. Middle ear looks healthy and dry. Hearing: Tuning fork lateralizes to the right. Nose: Nasal cavities are clear with healthy mucosa, no polyps or exudate.Airways are patent. Face: No masses or scars, facial nerve function is symmetric. Oral Cavity: No mucosal abnormalities are noted. Tongue with normal mobility. Dentition appears healthy. Oropharynx: Tonsils are symmetric. There are no mucosal masses identified. Tongue base appears normal and healthy. Larynx/Hypopharynx: deferred Chest: Deferred Neck: No palpable masses, no cervical adenopathy, no thyroid nodules or enlargement. Neuro: Cranial nerves II-XII will normal function. Balance: Normal gate. Other findings: none.  Independent Review of Additional Tests or Records:  Tympanogram is normal on the left. 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 Options were discussed including do nothing, hearing aid, tympanoplasty surgery. She has elected to undergo reconstructive surgery. We discussed the nature of the surgery, general anesthetic, outpatient, 80% chance of success. All questions were answered.

## 2017-08-23 ENCOUNTER — Other Ambulatory Visit: Payer: Self-pay | Admitting: Gynecology

## 2017-08-23 DIAGNOSIS — Z1231 Encounter for screening mammogram for malignant neoplasm of breast: Secondary | ICD-10-CM

## 2017-08-28 ENCOUNTER — Encounter (HOSPITAL_BASED_OUTPATIENT_CLINIC_OR_DEPARTMENT_OTHER): Payer: Self-pay | Admitting: *Deleted

## 2017-08-28 ENCOUNTER — Other Ambulatory Visit: Payer: Self-pay

## 2017-08-28 NOTE — Progress Notes (Signed)
   08/28/17 1112  OBSTRUCTIVE SLEEP APNEA  Have you ever been diagnosed with sleep apnea through a sleep study? No  Do you snore loudly (loud enough to be heard through closed doors)?  1  Do you often feel tired, fatigued, or sleepy during the daytime (such as falling asleep during driving or talking to someone)? 0  Has anyone observed you stop breathing during your sleep? 0  Do you have, or are you being treated for high blood pressure? 1  BMI more than 35 kg/m2? 1  Age > 50 (1-yes) 1  Neck circumference greater than:Female 16 inches or larger, Female 17inches or larger? 0  Female Gender (Yes=1) 0  Obstructive Sleep Apnea Score 4  Score 5 or greater  Results sent to PCP (Dr Alain Marion)

## 2017-08-29 ENCOUNTER — Ambulatory Visit (INDEPENDENT_AMBULATORY_CARE_PROVIDER_SITE_OTHER): Payer: Medicare Other | Admitting: Ophthalmology

## 2017-09-02 ENCOUNTER — Ambulatory Visit (HOSPITAL_BASED_OUTPATIENT_CLINIC_OR_DEPARTMENT_OTHER): Payer: Medicare Other | Admitting: Anesthesiology

## 2017-09-02 ENCOUNTER — Encounter (HOSPITAL_BASED_OUTPATIENT_CLINIC_OR_DEPARTMENT_OTHER)
Admission: RE | Disposition: A | Discharge status: Home / Self Care | Payer: Self-pay | Source: Ambulatory Visit | Attending: Otolaryngology

## 2017-09-02 ENCOUNTER — Encounter (HOSPITAL_BASED_OUTPATIENT_CLINIC_OR_DEPARTMENT_OTHER): Payer: Self-pay

## 2017-09-02 ENCOUNTER — Ambulatory Visit (HOSPITAL_BASED_OUTPATIENT_CLINIC_OR_DEPARTMENT_OTHER)
Admission: RE | Admit: 2017-09-02 | Discharge: 2017-09-02 | Disposition: A | Discharge status: Home / Self Care | Payer: Medicare Other | Source: Ambulatory Visit | Attending: Otolaryngology | Admitting: Otolaryngology

## 2017-09-02 ENCOUNTER — Other Ambulatory Visit: Payer: Self-pay

## 2017-09-02 DIAGNOSIS — Z7982 Long term (current) use of aspirin: Secondary | ICD-10-CM | POA: Diagnosis not present

## 2017-09-02 DIAGNOSIS — E669 Obesity, unspecified: Secondary | ICD-10-CM | POA: Insufficient documentation

## 2017-09-02 DIAGNOSIS — E785 Hyperlipidemia, unspecified: Secondary | ICD-10-CM | POA: Insufficient documentation

## 2017-09-02 DIAGNOSIS — I10 Essential (primary) hypertension: Secondary | ICD-10-CM | POA: Insufficient documentation

## 2017-09-02 DIAGNOSIS — Z7951 Long term (current) use of inhaled steroids: Secondary | ICD-10-CM | POA: Diagnosis not present

## 2017-09-02 DIAGNOSIS — F419 Anxiety disorder, unspecified: Secondary | ICD-10-CM | POA: Diagnosis not present

## 2017-09-02 DIAGNOSIS — H902 Conductive hearing loss, unspecified: Secondary | ICD-10-CM | POA: Diagnosis not present

## 2017-09-02 DIAGNOSIS — Z6837 Body mass index (BMI) 37.0-37.9, adult: Secondary | ICD-10-CM | POA: Diagnosis not present

## 2017-09-02 DIAGNOSIS — K219 Gastro-esophageal reflux disease without esophagitis: Secondary | ICD-10-CM | POA: Insufficient documentation

## 2017-09-02 DIAGNOSIS — H7291 Unspecified perforation of tympanic membrane, right ear: Secondary | ICD-10-CM | POA: Diagnosis not present

## 2017-09-02 DIAGNOSIS — Z79899 Other long term (current) drug therapy: Secondary | ICD-10-CM | POA: Diagnosis not present

## 2017-09-02 DIAGNOSIS — H9191 Unspecified hearing loss, right ear: Secondary | ICD-10-CM | POA: Diagnosis present

## 2017-09-02 HISTORY — DX: Cardiac arrhythmia, unspecified: I49.9

## 2017-09-02 HISTORY — PX: TYMPANOPLASTY: SHX33

## 2017-09-02 SURGERY — TYMPANOPLASTY
Anesthesia: General | Site: Ear | Laterality: Right

## 2017-09-02 MED ORDER — FENTANYL CITRATE (PF) 100 MCG/2ML IJ SOLN
50.0000 ug | INTRAMUSCULAR | Status: DC | PRN
  Administered 2017-09-02 (×2): 50 ug via INTRAVENOUS

## 2017-09-02 MED ORDER — FENTANYL CITRATE (PF) 100 MCG/2ML IJ SOLN
INTRAMUSCULAR | Status: AC
  Filled 2017-09-02: qty 2

## 2017-09-02 MED ORDER — PROPOFOL 500 MG/50ML IV EMUL
INTRAVENOUS | Status: AC
  Filled 2017-09-02: qty 150

## 2017-09-02 MED ORDER — CIPROFLOXACIN-DEXAMETHASONE 0.3-0.1 % OT SUSP
OTIC | Status: DC | PRN
  Administered 2017-09-02: 4 [drp] via OTIC

## 2017-09-02 MED ORDER — CIPROFLOXACIN-DEXAMETHASONE 0.3-0.1 % OT SUSP: 3.0000 [drp] | Freq: Three times a day (TID) | OTIC | 2 refills | Status: DC

## 2017-09-02 MED ORDER — SUGAMMADEX SODIUM 200 MG/2ML IV SOLN
INTRAVENOUS | Status: DC | PRN
  Administered 2017-09-02: 200 mg via INTRAVENOUS

## 2017-09-02 MED ORDER — LIDOCAINE-EPINEPHRINE 1 %-1:100000 IJ SOLN
INTRAMUSCULAR | Status: AC
  Filled 2017-09-02: qty 1

## 2017-09-02 MED ORDER — ONDANSETRON HCL 4 MG/2ML IJ SOLN
INTRAMUSCULAR | Status: DC | PRN
  Administered 2017-09-02: 4 mg via INTRAVENOUS

## 2017-09-02 MED ORDER — EPINEPHRINE 30 MG/30ML IJ SOLN
INTRAMUSCULAR | Status: AC
Start: 2017-09-02 — End: 2017-09-02
  Filled 2017-09-02: qty 1

## 2017-09-02 MED ORDER — HYDROCODONE-ACETAMINOPHEN 7.5-325 MG PO TABS: 1.0000 | ORAL_TABLET | Freq: Four times a day (QID) | ORAL | 0 refills | Status: DC | PRN

## 2017-09-02 MED ORDER — FENTANYL CITRATE (PF) 100 MCG/2ML IJ SOLN
25.0000 ug | INTRAMUSCULAR | Status: DC | PRN
  Administered 2017-09-02 (×2): 25 ug via INTRAVENOUS

## 2017-09-02 MED ORDER — PROMETHAZINE HCL 25 MG RE SUPP: 25.0000 mg | Freq: Four times a day (QID) | RECTAL | 1 refills | Status: DC | PRN

## 2017-09-02 MED ORDER — METHYLENE BLUE 0.5 % INJ SOLN
INTRAVENOUS | Status: AC
  Filled 2017-09-02: qty 10

## 2017-09-02 MED ORDER — LACTATED RINGERS IV SOLN
INTRAVENOUS | Status: DC
  Administered 2017-09-02: 07:00:00 via INTRAVENOUS

## 2017-09-02 MED ORDER — BACITRACIN ZINC 500 UNIT/GM EX OINT
TOPICAL_OINTMENT | CUTANEOUS | Status: AC
  Filled 2017-09-02: qty 28.35

## 2017-09-02 MED ORDER — MIDAZOLAM HCL 2 MG/2ML IJ SOLN
1.0000 mg | INTRAMUSCULAR | Status: DC | PRN
  Administered 2017-09-02: 2 mg via INTRAVENOUS

## 2017-09-02 MED ORDER — SCOPOLAMINE 1 MG/3DAYS TD PT72: 1.0000 | MEDICATED_PATCH | Freq: Once | TRANSDERMAL | Status: DC | PRN

## 2017-09-02 MED ORDER — DEXAMETHASONE SODIUM PHOSPHATE 10 MG/ML IJ SOLN
INTRAMUSCULAR | Status: AC
  Filled 2017-09-02: qty 1

## 2017-09-02 MED ORDER — MIDAZOLAM HCL 2 MG/2ML IJ SOLN
INTRAMUSCULAR | Status: AC
  Filled 2017-09-02: qty 2

## 2017-09-02 MED ORDER — PROPOFOL 500 MG/50ML IV EMUL
INTRAVENOUS | Status: DC | PRN
  Administered 2017-09-02: 200 ug/kg/min via INTRAVENOUS

## 2017-09-02 MED ORDER — ROCURONIUM BROMIDE 100 MG/10ML IV SOLN
INTRAVENOUS | Status: DC | PRN
  Administered 2017-09-02: 30 mg via INTRAVENOUS

## 2017-09-02 MED ORDER — LIDOCAINE HCL (CARDIAC) 20 MG/ML IV SOLN
INTRAVENOUS | Status: AC
Start: 2017-09-02 — End: 2017-09-02
  Filled 2017-09-02: qty 5

## 2017-09-02 MED ORDER — ONDANSETRON HCL 4 MG/2ML IJ SOLN: 4.0000 mg | Freq: Once | INTRAMUSCULAR | Status: DC | PRN

## 2017-09-02 MED ORDER — ACETAMINOPHEN 500 MG PO TABS
1000.0000 mg | ORAL_TABLET | Freq: Once | ORAL | Status: AC
  Administered 2017-09-02: 1000 mg via ORAL

## 2017-09-02 MED ORDER — BACITRACIN ZINC 500 UNIT/GM EX OINT
TOPICAL_OINTMENT | CUTANEOUS | Status: DC | PRN
  Administered 2017-09-02: 1 via TOPICAL

## 2017-09-02 MED ORDER — ACETAMINOPHEN 500 MG PO TABS
ORAL_TABLET | ORAL | Status: AC
  Filled 2017-09-02: qty 2

## 2017-09-02 MED ORDER — PHENYLEPHRINE HCL 10 MG/ML IJ SOLN
INTRAMUSCULAR | Status: DC | PRN
  Administered 2017-09-02: 40 ug via INTRAVENOUS
  Administered 2017-09-02: 80 ug via INTRAVENOUS

## 2017-09-02 MED ORDER — PROPOFOL 10 MG/ML IV BOLUS
INTRAVENOUS | Status: DC | PRN
  Administered 2017-09-02: 20 mg via INTRAVENOUS
  Administered 2017-09-02: 150 mg via INTRAVENOUS
  Administered 2017-09-02: 20 mg via INTRAVENOUS

## 2017-09-02 MED ORDER — LIDOCAINE 2% (20 MG/ML) 5 ML SYRINGE
INTRAMUSCULAR | Status: DC | PRN
  Administered 2017-09-02: 80 mg via INTRAVENOUS

## 2017-09-02 MED ORDER — ONDANSETRON HCL 4 MG/2ML IJ SOLN
INTRAMUSCULAR | Status: AC
  Filled 2017-09-02: qty 2

## 2017-09-02 MED ORDER — SUCCINYLCHOLINE CHLORIDE 20 MG/ML IJ SOLN
INTRAMUSCULAR | Status: DC | PRN
  Administered 2017-09-02: 100 mg via INTRAVENOUS

## 2017-09-02 MED ORDER — CIPROFLOXACIN-DEXAMETHASONE 0.3-0.1 % OT SUSP
OTIC | Status: AC
Start: 2017-09-02 — End: 2017-09-02
  Filled 2017-09-02: qty 7.5

## 2017-09-02 MED ORDER — LIDOCAINE-EPINEPHRINE 1 %-1:100000 IJ SOLN
INTRAMUSCULAR | Status: DC | PRN
  Administered 2017-09-02: 1 mL

## 2017-09-02 MED ORDER — PROPOFOL 500 MG/50ML IV EMUL
INTRAVENOUS | Status: AC
  Filled 2017-09-02: qty 50

## 2017-09-02 SURGICAL SUPPLY — 71 items
ADH SKN CLS APL DERMABOND .7 (GAUZE/BANDAGES/DRESSINGS)
APL SKNCLS STERI-STRIP NONHPOA (GAUZE/BANDAGES/DRESSINGS)
BALL CTTN LRG ABS STRL LF (GAUZE/BANDAGES/DRESSINGS) ×1
BENZOIN TINCTURE PRP APPL 2/3 (GAUZE/BANDAGES/DRESSINGS) IMPLANT
BLADE CLIPPER SURG (BLADE) IMPLANT
BLADE NDL 3 SS STRL (BLADE) IMPLANT
BLADE NEEDLE 3 SS STRL (BLADE) IMPLANT
BLADE NEEDLE 3MM SS STRL (BLADE)
BNDG CONFORM 4 STRL LF (GAUZE/BANDAGES/DRESSINGS) IMPLANT
BNDG GAUZE ELAST 4 BULKY (GAUZE/BANDAGES/DRESSINGS) IMPLANT
CANISTER SUCT 1200ML W/VALVE (MISCELLANEOUS) ×3 IMPLANT
CLEANER CAUTERY TIP 5X5 PAD (MISCELLANEOUS) ×1 IMPLANT
CLOSURE WOUND 1/2 X4 (GAUZE/BANDAGES/DRESSINGS)
COTTONBALL LRG STERILE PKG (GAUZE/BANDAGES/DRESSINGS) ×3 IMPLANT
DECANTER SPIKE VIAL GLASS SM (MISCELLANEOUS) ×3 IMPLANT
DERMABOND ADVANCED (GAUZE/BANDAGES/DRESSINGS)
DERMABOND ADVANCED .7 DNX12 (GAUZE/BANDAGES/DRESSINGS) IMPLANT
DRAPE EENT ADH APERT 31X51 STR (DRAPES) ×3 IMPLANT
DRAPE INCISE 23X17 IOBAN STRL (DRAPES)
DRAPE INCISE 23X17 STRL (DRAPES) IMPLANT
DRAPE INCISE IOBAN 23X17 STRL (DRAPES) IMPLANT
DRAPE MICROSCOPE URBAN (DRAPES) IMPLANT
DRAPE MICROSCOPE WILD 40.5X102 (DRAPES) IMPLANT
DROPPER MEDICINE STER 1.5ML LF (MISCELLANEOUS) IMPLANT
DRSG GLASSCOCK MASTOID ADT (GAUZE/BANDAGES/DRESSINGS) ×2 IMPLANT
DRSG GLASSCOCK MASTOID PED (GAUZE/BANDAGES/DRESSINGS) IMPLANT
DRSG TELFA 3X8 NADH (GAUZE/BANDAGES/DRESSINGS) IMPLANT
ELECT COATED BLADE 2.86 ST (ELECTRODE) ×3 IMPLANT
ELECT REM PT RETURN 9FT ADLT (ELECTROSURGICAL) ×3
ELECTRODE REM PT RTRN 9FT ADLT (ELECTROSURGICAL) ×1 IMPLANT
GAUZE SPONGE 4X4 12PLY STRL (GAUZE/BANDAGES/DRESSINGS) IMPLANT
GAUZE SPONGE 4X4 12PLY STRL LF (GAUZE/BANDAGES/DRESSINGS) IMPLANT
GAUZE SPONGE 4X4 16PLY XRAY LF (GAUZE/BANDAGES/DRESSINGS) IMPLANT
GLOVE BIO SURGEON STRL SZ 6.5 (GLOVE) ×1 IMPLANT
GLOVE BIO SURGEONS STRL SZ 6.5 (GLOVE) ×1
GLOVE BIOGEL PI IND STRL 7.0 (GLOVE) IMPLANT
GLOVE BIOGEL PI INDICATOR 7.0 (GLOVE) ×2
GLOVE ECLIPSE 7.5 STRL STRAW (GLOVE) ×3 IMPLANT
GOWN STRL REUS W/ TWL LRG LVL3 (GOWN DISPOSABLE) ×2 IMPLANT
GOWN STRL REUS W/ TWL XL LVL3 (GOWN DISPOSABLE) ×1 IMPLANT
GOWN STRL REUS W/TWL LRG LVL3 (GOWN DISPOSABLE) ×3
GOWN STRL REUS W/TWL XL LVL3 (GOWN DISPOSABLE) ×3
GRAFT BIODESIGN OTOLOGIC (Tissue) ×2 IMPLANT
IV CATH AUTO 14GX1.75 SAFE ORG (IV SOLUTION) IMPLANT
IV SET EXT 30 76VOL 4 MALE LL (IV SETS) ×3 IMPLANT
NDL PRECISIONGLIDE 27X1.5 (NEEDLE) ×1 IMPLANT
NDL SAFETY ECLIPSE 18X1.5 (NEEDLE) ×1 IMPLANT
NEEDLE HYPO 18GX1.5 SHARP (NEEDLE) ×3
NEEDLE PRECISIONGLIDE 27X1.5 (NEEDLE) ×3 IMPLANT
NS IRRIG 1000ML POUR BTL (IV SOLUTION) ×3 IMPLANT
PACK BASIN DAY SURGERY FS (CUSTOM PROCEDURE TRAY) ×3 IMPLANT
PACK ENT DAY SURGERY (CUSTOM PROCEDURE TRAY) ×3 IMPLANT
PAD CLEANER CAUTERY TIP 5X5 (MISCELLANEOUS) ×2
PAD DRESSING TELFA 3X8 NADH (GAUZE/BANDAGES/DRESSINGS) IMPLANT
PENCIL FOOT CONTROL (ELECTRODE) ×1 IMPLANT
SHEET MEDIUM DRAPE 40X70 STRL (DRAPES) IMPLANT
SLEEVE SCD COMPRESS KNEE MED (MISCELLANEOUS) IMPLANT
SPONGE SURGIFOAM ABS GEL 12-7 (HEMOSTASIS) IMPLANT
STRIP CLOSURE SKIN 1/2X4 (GAUZE/BANDAGES/DRESSINGS) IMPLANT
SUT CHROMIC 3 0 PS 2 (SUTURE) IMPLANT
SUT CHROMIC 4 0 P 3 18 (SUTURE) IMPLANT
SUT CHROMIC 4 0 PS 2 18 (SUTURE) IMPLANT
SUT ETHILON 5 0 P 3 18 (SUTURE)
SUT NYLON ETHILON 5-0 P-3 1X18 (SUTURE) IMPLANT
SUT PLAIN 5 0 P 3 18 (SUTURE) IMPLANT
SUT VIC AB 3-0 FS2 27 (SUTURE) IMPLANT
SYR 5ML LL (SYRINGE) IMPLANT
SYR BULB 3OZ (MISCELLANEOUS) IMPLANT
TOWEL OR 17X24 6PK STRL BLUE (TOWEL DISPOSABLE) ×3 IMPLANT
TRAY DSU PREP LF (CUSTOM PROCEDURE TRAY) ×3 IMPLANT
TUBING IRRIGATION (MISCELLANEOUS) IMPLANT

## 2017-09-02 NOTE — Anesthesia Preprocedure Evaluation (Addendum)
Anesthesia Evaluation  Patient identified by MRN, date of birth, ID band Patient awake    Reviewed: Allergy & Precautions, NPO status , Patient's Chart, lab work & pertinent test results  History of Anesthesia Complications (+) PONV and history of anesthetic complications  Airway Mallampati: II  TM Distance: >3 FB Neck ROM: Full    Dental  (+) Teeth Intact, Dental Advisory Given, Caps,    Pulmonary neg pulmonary ROS,    Pulmonary exam normal breath sounds clear to auscultation       Cardiovascular hypertension, Pt. on medications Normal cardiovascular exam Rhythm:Regular Rate:Normal     Neuro/Psych  Headaches, PSYCHIATRIC DISORDERS Anxiety Depression    GI/Hepatic Neg liver ROS, PUD, GERD  Medicated and Controlled,  Endo/Other  negative endocrine ROS  Renal/GU negative Renal ROS     Musculoskeletal  (+) Arthritis , Osteoarthritis,  Fibromyalgia -  Abdominal   Peds  Hematology Obesity    Anesthesia Other Findings Day of surgery medications reviewed with the patient.  Reproductive/Obstetrics                            Anesthesia Physical Anesthesia Plan  ASA: II  Anesthesia Plan: General   Post-op Pain Management:    Induction: Intravenous  PONV Risk Score and Plan: 4 or greater and Midazolam, Dexamethasone, Ondansetron, Treatment may vary due to age or medical condition and TIVA  Airway Management Planned: Oral ETT  Additional Equipment:   Intra-op Plan:   Post-operative Plan: Extubation in OR  Informed Consent: I have reviewed the patients History and Physical, chart, labs and discussed the procedure including the risks, benefits and alternatives for the proposed anesthesia with the patient or authorized representative who has indicated his/her understanding and acceptance.   Dental advisory given  Plan Discussed with: CRNA  Anesthesia Plan Comments:         Anesthesia Quick Evaluation

## 2017-09-02 NOTE — Transfer of Care (Signed)
Immediate Anesthesia Transfer of Care Note  Patient: [name]  Procedure(s) Performed: REVISION RIGHT TYMPANOPLASTY (Right Ear)  Patient Location: PACU  Anesthesia Type:General  Level of Consciousness: awake, alert  and oriented  Airway & Oxygen Therapy: Patient Spontanous Breathing and Patient connected to face mask oxygen  Post-op Assessment: Report given to RN and Post -op Vital signs reviewed and stable  Post vital signs: Reviewed and stable  Last Vitals:  Vitals:   09/02/17 0644 09/02/17 0845  BP: (!) 131/58 (!) 123/55  Pulse: 80   Resp: 18   Temp: 36.6 C 37.1 C  SpO2: 96%     Last Pain:  Vitals:   09/02/17 0644  TempSrc: Oral         Complications: No apparent anesthesia complications

## 2017-09-02 NOTE — Op Note (Signed)
OPERATIVE REPORT  DATE OF SURGERY: 09/02/2017  PATIENT:  [name],  72 y.o. female  PRE-OPERATIVE DIAGNOSIS:  PERFORATION OF RIGHT TYMPANIC MEMBRANE  POST-OPERATIVE DIAGNOSIS:  PERFORATION OF RIGHT TYMPANIC MEMBRANE  PROCEDURE:  Procedure(s): REVISION RIGHT TYMPANOPLASTY  SURGEON:  Beckie Salts, MD  ASSISTANTS: none  ANESTHESIA:   General   EBL:  10 ml  DRAINS: none 1% Xylocaine with epinephrine  LOCAL MEDICATIONS USED: 1% Xylocaine with epinephrine  SPECIMEN:  none  COUNTS:  Correct  PROCEDURE DETAILS: The patient was taken to the operating room and placed on the operating table in the supine position. Following induction of general endotracheal anesthesia, the right ear was prepped and draped in a standard fashion.  The operating microscope was draped and brought into the operative field.  The ear canal was inspected.  A 30% perforation in the posterior inferior quadrant was identified.  There was adhesion of the edge of the perforation to the promontory posteriorly.  A sharp pick was used to score the edges of the perforation circumferentially.  Cup forceps were used to remove the edges of the perforation circumferentially.  A Knife was used to elevate off of the inferior and posterior annulus.  A sharp pick was used to dissected off of the manubrium of the malleus.  The edges were completely freed and cleaned off.  The middle ear was packed with saline soaked Gelfoam pieces.  A [employer] graft was cut to size and shape and notched for the manubrium.  This was placed in an underlay technique.  Care was taken to position the graft properly, and additional Gelfoam for support medially, and to draped the edges of the perforation in good position on top of the graft circumferentially.  Ciprodex-soaked Gelfoam was then placed laterally to support the graft.  Ear canal was packed with additional Gelfoam.  Cotton ball with bacitracin was placed at the meatus.  A Glasscock dressing was  applied.  Patient was awakened extubated transferred to recovery in stable condition.    PATIENT DISPOSITION:  To PACU, stable

## 2017-09-02 NOTE — Anesthesia Postprocedure Evaluation (Signed)
Anesthesia Post Note  Patient: [name]  Procedure(s) Performed: REVISION RIGHT TYMPANOPLASTY (Right Ear)     Patient location during evaluation: PACU Anesthesia Type: General Level of consciousness: awake and alert Pain management: pain level controlled Vital Signs Assessment: post-procedure vital signs reviewed and stable Respiratory status: spontaneous breathing, nonlabored ventilation and respiratory function stable Cardiovascular status: blood pressure returned to baseline and stable Postop Assessment: no apparent nausea or vomiting Anesthetic complications: no    Last Vitals:  Vitals:   09/02/17 0915 09/02/17 0937  BP: (!) 138/55 (!) 156/83  Pulse: 79 88  Resp: 18 16  Temp:  37 C  SpO2: 96% 98%    Last Pain:  Vitals:   09/02/17 0937  TempSrc:   PainSc: 0-No pain                 Catalina Gravel

## 2017-09-02 NOTE — Interval H&P Note (Signed)
History and Physical Interval Note:  09/02/2017 7:14 AM  [name]  has presented today for surgery, with the diagnosis of PERFORATION OF RIGHT TYMPANIC MEMBRANE  The various methods of treatment have been discussed with the patient and family. After consideration of risks, benefits and other options for treatment, the patient has consented to  Procedure(s): REVISION RIGHT TYMPANOPLASTY (Right) as a surgical intervention .  The patient's history has been reviewed, patient examined, no change in status, stable for surgery.  I have reviewed the patient's chart and labs.  Questions were answered to the patient's satisfaction.     Izora Gala

## 2017-09-02 NOTE — Anesthesia Procedure Notes (Signed)
Procedure Name: Intubation Date/Time: 09/02/2017 7:40 AM Performed by: Maryella Shivers, CRNA Pre-anesthesia Checklist: Patient identified, Emergency Drugs available, Suction available and Patient being monitored Patient Re-evaluated:Patient Re-evaluated prior to induction Oxygen Delivery Method: Circle system utilized Preoxygenation: Pre-oxygenation with 100% oxygen Induction Type: IV induction Ventilation: Mask ventilation without difficulty Laryngoscope Size: Mac and 3 Tube type: Oral Tube size: 7.0 mm Number of attempts: 1 Airway Equipment and Method: Stylet and Oral airway Placement Confirmation: ETT inserted through vocal cords under direct vision,  positive ETCO2 and breath sounds checked- equal and bilateral Secured at: 20 cm Tube secured with: Tape Dental Injury: Teeth and Oropharynx as per pre-operative assessment

## 2017-09-02 NOTE — Discharge Instructions (Signed)
You may remove the dressing in the morning on Tuesday.  Peel off the Velcro strap.  The dressing then should come off.  He will off the small adhesive pad from the forehead.  Remove the cotton ball from the ear, place 3 drops of the eardrops into the ear.  Replaced with a fresh cotton ball.  Repeat this 3 times daily.  Keep all water out of the ear.  Is very important, do not blow your nose.  Open your mouth if you have to sneeze.  Do not bend over or lift anything more than 10 pounds.  Do not strain at all.  Post Anesthesia Home Care Instructions  Activity: Get plenty of rest for the remainder of the day. A responsible individual must stay with you for 24 hours following the procedure.  For the next 24 hours, DO NOT: -Drive a car -Paediatric nurse -Drink alcoholic beverages -Take any medication unless instructed by your physician -Make any legal decisions or sign important papers.  Meals: Start with liquid foods such as gelatin or soup. Progress to regular foods as tolerated. Avoid greasy, spicy, heavy foods. If nausea and/or vomiting occur, drink only clear liquids until the nausea and/or vomiting subsides. Call your physician if vomiting continues.  Special Instructions/Symptoms: Your throat may feel dry or sore from the anesthesia or the breathing tube placed in your throat during surgery. If this causes discomfort, gargle with warm salt water. The discomfort should disappear within 24 hours.  If you had a scopolamine patch placed behind your ear for the management of post- operative nausea and/or vomiting:  1. The medication in the patch is effective for 72 hours, after which it should be removed.  Wrap patch in a tissue and discard in the trash. Wash hands thoroughly with soap and water. 2. You may remove the patch earlier than 72 hours if you experience unpleasant side effects which may include dry mouth, dizziness or visual disturbances. 3. Avoid touching the patch. Wash your  hands with soap and water after contact with the patch.

## 2017-09-03 ENCOUNTER — Encounter (HOSPITAL_BASED_OUTPATIENT_CLINIC_OR_DEPARTMENT_OTHER): Payer: Self-pay | Admitting: Otolaryngology

## 2017-09-03 NOTE — Addendum Note (Signed)
Addendum  created 09/03/17 9826 by Taleigha Pinson, Ernesta Amble, CRNA   Charge Capture section accepted

## 2017-09-05 ENCOUNTER — Ambulatory Visit (INDEPENDENT_AMBULATORY_CARE_PROVIDER_SITE_OTHER): Payer: Medicare Other | Admitting: Ophthalmology

## 2017-09-17 ENCOUNTER — Telehealth: Payer: Self-pay | Admitting: Physician Assistant

## 2017-09-17 NOTE — Telephone Encounter (Signed)
Spoke to [employer] to advise this pateint is not on Amitiza.  She is now on Linzess 290 mcg.  This patient has not seen Nicoletta Ba PA since 2015.

## 2017-09-28 ENCOUNTER — Other Ambulatory Visit: Payer: Self-pay | Admitting: Internal Medicine

## 2017-09-30 ENCOUNTER — Ambulatory Visit
Admission: RE | Admit: 2017-09-30 | Discharge: 2017-09-30 | Disposition: A | Discharge status: Home / Self Care | Payer: Medicare Other | Source: Ambulatory Visit | Attending: Gynecology | Admitting: Gynecology

## 2017-09-30 DIAGNOSIS — Z1231 Encounter for screening mammogram for malignant neoplasm of breast: Secondary | ICD-10-CM

## 2017-10-09 ENCOUNTER — Other Ambulatory Visit: Payer: Self-pay | Admitting: Internal Medicine

## 2017-10-09 NOTE — Telephone Encounter (Signed)
Xanax refill Last OV: 07/25/17 Last Refill:07/25/17  60 tabs/3 refills (sent to [facility]) Pharmacy: [facility], [place] [phone] (Phone) [phone] (Fax)

## 2017-10-09 NOTE — Telephone Encounter (Signed)
Copied from [place] [id number]. Topic: Quick Communication - Rx Refill/Question >> Oct 09, 2017  9:25 AM Arletha Grippe wrote: Medication: ALPRAZolam Duanne Moron) 0.5 MG tablet Has the patient contacted their pharmacy? Yes.   (Agent: If no, request that the patient contact the pharmacy for the refill.) Preferred Pharmacy (with phone number or street name): [facility]  cb is [phone] reference [id number] Agent: Please be advised that RX refills may take up to 3 business days. We ask that you follow-up with your pharmacy.

## 2017-10-10 MED ORDER — ALPRAZOLAM 0.5 MG PO TABS: 0.5000 mg | ORAL_TABLET | Freq: Two times a day (BID) | ORAL | 3 refills | Status: DC | PRN

## 2017-10-22 ENCOUNTER — Encounter: Payer: Self-pay | Admitting: Internal Medicine

## 2017-10-22 ENCOUNTER — Ambulatory Visit: Payer: Medicare Other | Admitting: Internal Medicine

## 2017-10-22 VITALS — BP 128/76 | HR 76 | Temp 98.5°F | Ht 60.0 in | Wt 192.0 lb

## 2017-10-22 DIAGNOSIS — F411 Generalized anxiety disorder: Secondary | ICD-10-CM | POA: Diagnosis not present

## 2017-10-22 DIAGNOSIS — E785 Hyperlipidemia, unspecified: Secondary | ICD-10-CM

## 2017-10-22 DIAGNOSIS — E559 Vitamin D deficiency, unspecified: Secondary | ICD-10-CM

## 2017-10-22 DIAGNOSIS — F4323 Adjustment disorder with mixed anxiety and depressed mood: Secondary | ICD-10-CM

## 2017-10-22 DIAGNOSIS — I10 Essential (primary) hypertension: Secondary | ICD-10-CM

## 2017-10-22 DIAGNOSIS — K5901 Slow transit constipation: Secondary | ICD-10-CM | POA: Diagnosis not present

## 2017-10-22 NOTE — Assessment & Plan Note (Signed)
Xananx  Potential benefits of a long term benzo use as well as potential risks  and complications were explained to the patient and were aknowledged. 

## 2017-10-22 NOTE — Assessment & Plan Note (Signed)
Vit d 

## 2017-10-22 NOTE — Assessment & Plan Note (Signed)
On Verapamil 

## 2017-10-22 NOTE — Assessment & Plan Note (Signed)
Wellbutrin

## 2017-10-22 NOTE — Assessment & Plan Note (Signed)
Wt Readings from Last 3 Encounters:  10/22/17 192 lb (87.1 kg)  09/02/17 194 lb (88 kg)  07/25/17 191 lb (86.6 kg)

## 2017-10-22 NOTE — Assessment & Plan Note (Signed)
Linzess prn 

## 2017-10-22 NOTE — Progress Notes (Signed)
Subjective:  Patient ID: [name], female    DOB: Nov 15, 1945  Age: 72 y.o. MRN: [id number]  CC: No chief complaint on file.   HPI [name] presents for anxiety, OA, depression f/u  Outpatient Medications Prior to Visit  Medication Sig Dispense Refill  . ALPRAZolam (XANAX) 0.5 MG tablet Take 1 tablet (0.5 mg total) by mouth 2 (two) times daily as needed. 60 tablet 3  . aspirin 81 MG tablet Take 81 mg by mouth daily.      Marland Kitchen buPROPion (WELLBUTRIN XL) 150 MG 24 hr tablet Take 1 tablet (150 mg total) by mouth daily. 90 tablet 3  . Cholecalciferol (VITAMIN D) 2000 UNITS CAPS Take 1 capsule by mouth daily.      . ciprofloxacin-dexamethasone (CIPRODEX) OTIC suspension Place 3 drops into the right ear 3 (three) times daily. 7.5 mL 2  . fish oil-omega-3 fatty acids 1000 MG capsule Take by mouth daily. Take  600 mg daily    . fluticasone (FLONASE) 50 MCG/ACT nasal spray Place 2 sprays into both nostrils daily. 48 g 1  . gabapentin (NEURONTIN) 100 MG capsule TAKE 1 CAPSULE BY MOUTH AT  BEDTIME 90 capsule 3  . linaclotide (LINZESS) 290 MCG CAPS capsule Take 1 capsule (290 mcg total) by mouth daily before breakfast. 90 capsule 1  . lovastatin (MEVACOR) 40 MG tablet TAKE 1 TABLET BY MOUTH AT  BEDTIME 90 tablet 1  . Multiple Vitamins-Minerals (OCUVITE PO) Take 2 tablets by mouth daily.    Marland Kitchen omeprazole (PRILOSEC) 40 MG capsule TAKE 1 CAPSULE BY MOUTH  TWICE DAILY BEFORE MEALS 180 capsule 1  . polyethylene glycol (MIRALAX) packet Take 17 g by mouth daily. 14 each 0  . Probiotic Product (ALIGN) 4 MG CAPS Take 1 capsule (4 mg total) by mouth daily. 30 capsule 1  . traZODone (DESYREL) 50 MG tablet Take 1 tablet (50 mg total) by mouth at bedtime. 90 tablet 1  . verapamil (CALAN-SR) 240 MG CR tablet TAKE ONE TABLET BY MOUTH AT BEDTIME 90 tablet 1  . VESICARE 10 MG tablet Take 10 mg by mouth daily.     . vitamin B-12 (CYANOCOBALAMIN) 1000 MCG tablet Take 500 mcg by mouth daily.     Marland Kitchen acetaminophen  (TYLENOL) 500 MG tablet Take 1,000 mg by mouth every 6 (six) hours as needed for mild pain.     Marland Kitchen HYDROcodone-acetaminophen (NORCO) 7.5-325 MG tablet Take 1 tablet by mouth every 6 (six) hours as needed for moderate pain. 20 tablet 0  . promethazine (PHENERGAN) 25 MG suppository Place 1 suppository (25 mg total) rectally every 6 (six) hours as needed for nausea or vomiting. 12 suppository 1   No facility-administered medications prior to visit.     ROS Review of Systems  Constitutional: Negative for activity change, appetite change, chills, fatigue and unexpected weight change.  HENT: Negative for congestion, mouth sores and sinus pressure.   Eyes: Negative for visual disturbance.  Respiratory: Negative for cough and chest tightness.   Gastrointestinal: Negative for abdominal pain and nausea.  Genitourinary: Negative for difficulty urinating, frequency and vaginal pain.  Musculoskeletal: Positive for arthralgias and gait problem. Negative for back pain.  Skin: Negative for pallor and rash.  Neurological: Negative for dizziness, tremors, weakness, numbness and headaches.  Psychiatric/Behavioral: Negative for confusion, sleep disturbance and suicidal ideas. The patient is nervous/anxious.     Objective:  BP 128/76 (BP Location: Right Arm, Patient Position: Sitting, Cuff Size: Large)   Pulse 76  Temp 98.5 F (36.9 C) (Oral)   Ht 5' (1.524 m)   Wt 192 lb (87.1 kg)   SpO2 98%   BMI 37.50 kg/m   BP Readings from Last 3 Encounters:  10/22/17 128/76  09/02/17 (!) 156/83  07/25/17 124/78    Wt Readings from Last 3 Encounters:  10/22/17 192 lb (87.1 kg)  09/02/17 194 lb (88 kg)  07/25/17 191 lb (86.6 kg)    Physical Exam  Constitutional: She appears well-developed. No distress.  HENT:  Head: Normocephalic.  Right Ear: External ear normal.  Left Ear: External ear normal.  Nose: Nose normal.  Mouth/Throat: Oropharynx is clear and moist.  Eyes: Pupils are equal, round, and  reactive to light. Conjunctivae are normal. Right eye exhibits no discharge. Left eye exhibits no discharge.  Neck: Normal range of motion. Neck supple. No JVD present. No tracheal deviation present. No thyromegaly present.  Cardiovascular: Normal rate, regular rhythm and normal heart sounds.  Pulmonary/Chest: No stridor. No respiratory distress. She has no wheezes.  Abdominal: Soft. Bowel sounds are normal. She exhibits no distension and no mass. There is no tenderness. There is no rebound and no guarding.  Musculoskeletal: She exhibits tenderness. She exhibits no edema.  Lymphadenopathy:    She has no cervical adenopathy.  Neurological: She displays normal reflexes. No cranial nerve deficit. She exhibits normal muscle tone. Coordination abnormal.  Skin: No rash noted. No erythema.  Psychiatric: She has a normal mood and affect. Her behavior is normal. Judgment and thought content normal.   R>L knees tender Obese  Lab Results  Component Value Date   WBC 8.5 02/14/2017   HGB 12.1 02/14/2017   HCT 36.8 02/14/2017   PLT 262 02/14/2017   GLUCOSE 108 (H) 02/16/2017   CHOL 179 01/16/2017   TRIG 170.0 (H) 01/16/2017   HDL 42.80 01/16/2017   LDLDIRECT 137.2 06/16/2013   LDLCALC 102 (H) 01/16/2017   ALT 14 02/14/2017   AST 15 02/14/2017   NA 139 02/16/2017   K 3.6 02/16/2017   CL 105 02/16/2017   CREATININE 0.72 02/16/2017   BUN 7 02/16/2017   CO2 25 02/16/2017   TSH 1.12 01/16/2017   INR 0.99 01/26/2017    Mm Screening Breast Tomo Bilateral  Result Date: 10/01/2017 CLINICAL DATA:  Screening. EXAM: DIGITAL SCREENING BILATERAL MAMMOGRAM WITH TOMO AND CAD COMPARISON:  Previous exam(s). ACR Breast Density Category b: There are scattered areas of fibroglandular density. FINDINGS: There are no findings suspicious for malignancy. Images were processed with CAD. IMPRESSION: No mammographic evidence of malignancy. A result letter of this screening mammogram will be mailed directly to the  patient. RECOMMENDATION: Screening mammogram in one year. (Code:SM-B-01Y) BI-RADS CATEGORY  1: Negative. Electronically Signed   By: Margarette Canada [name]   On: 10/01/2017 15:46    Assessment & Plan:   There are no diagnoses linked to this encounter. I have discontinued Margie Ege. Mahon's acetaminophen, HYDROcodone-acetaminophen, and promethazine. I am also having her maintain her aspirin, vitamin B-12, Vitamin D, fish oil-omega-3 fatty acids, Multiple Vitamins-Minerals (OCUVITE PO), ALIGN, VESICARE, polyethylene glycol, buPROPion, fluticasone, gabapentin, traZODone, omeprazole, lovastatin, linaclotide, ciprofloxacin-dexamethasone, verapamil, and ALPRAZolam.  No orders of the defined types were placed in this encounter.    Follow-up: No follow-ups on file.  Walker Kehr, MD

## 2017-11-10 IMAGING — DX DG ABD PORTABLE 1V
2 series · 2 of 2 positions shown · non-contrast
Comparison: Abdomen films of 02/11/2017

CLINICAL DATA: Ileus, morbid obesity

EXAM:
PORTABLE ABDOMEN - 1 VIEW

[abdomen kub (1 of 2)]
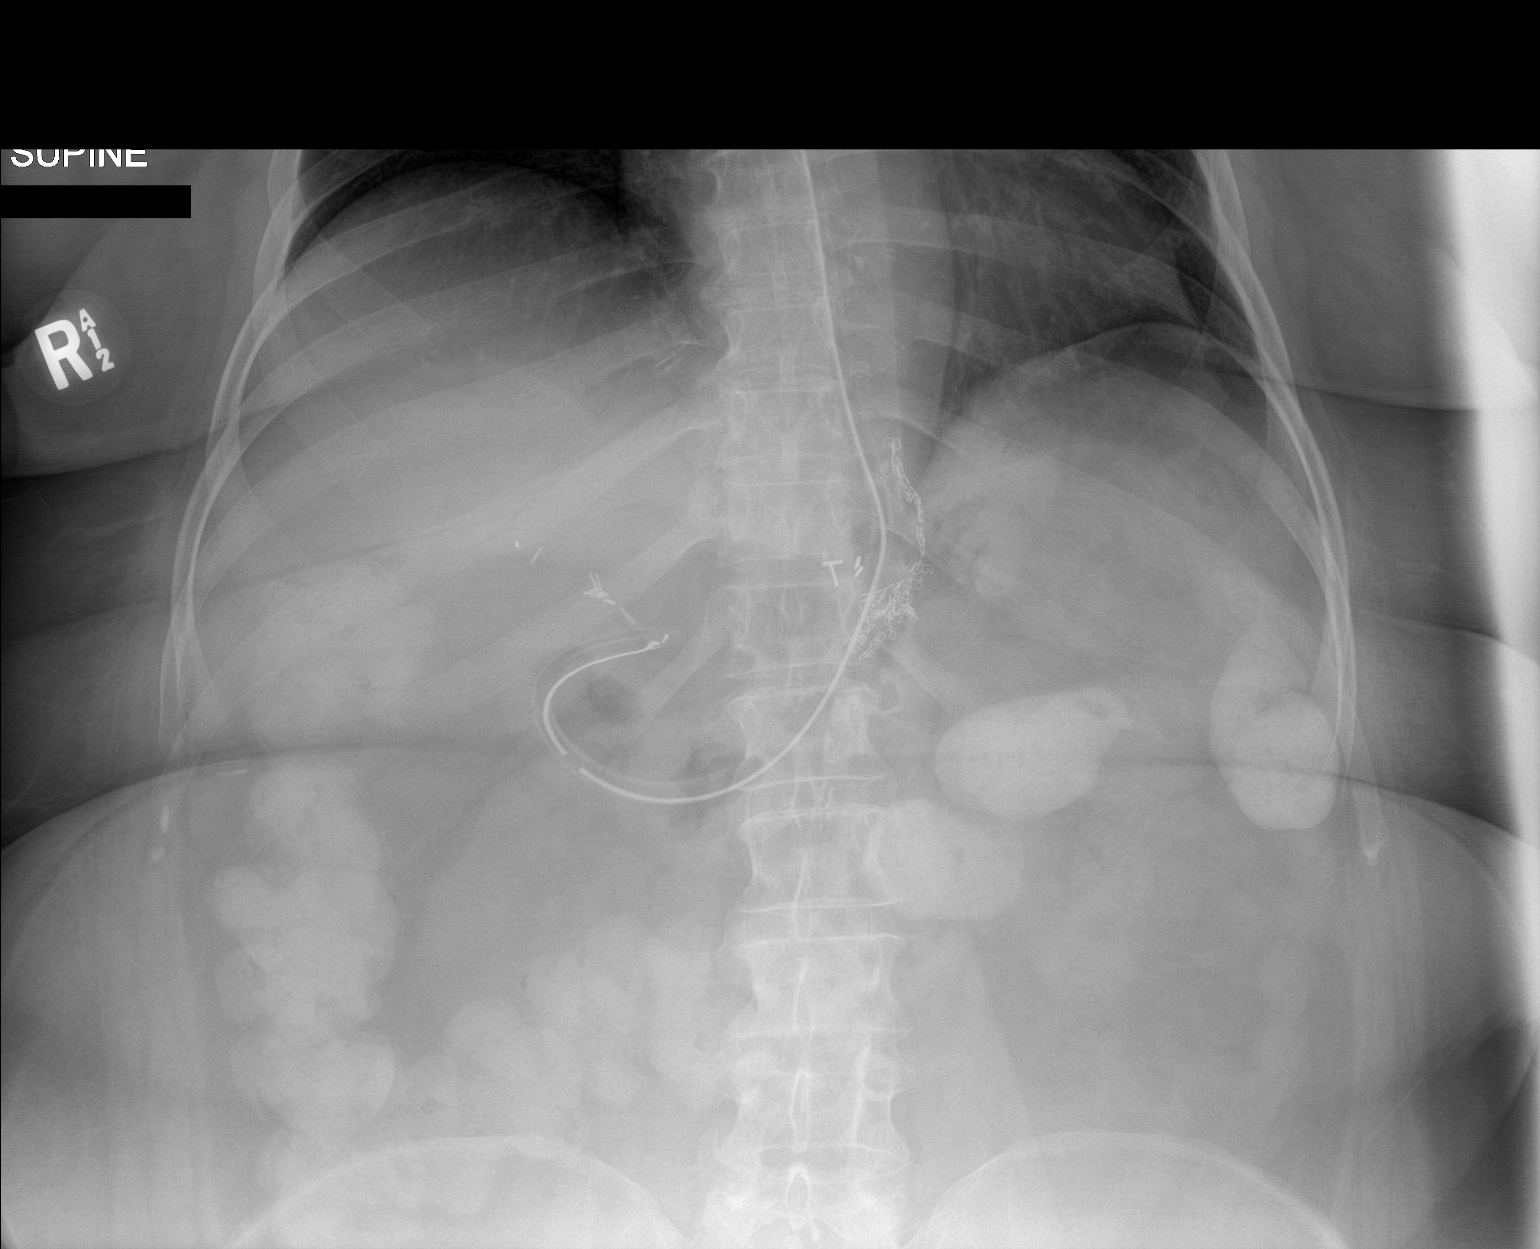

[abdomen kub (2 of 2)]
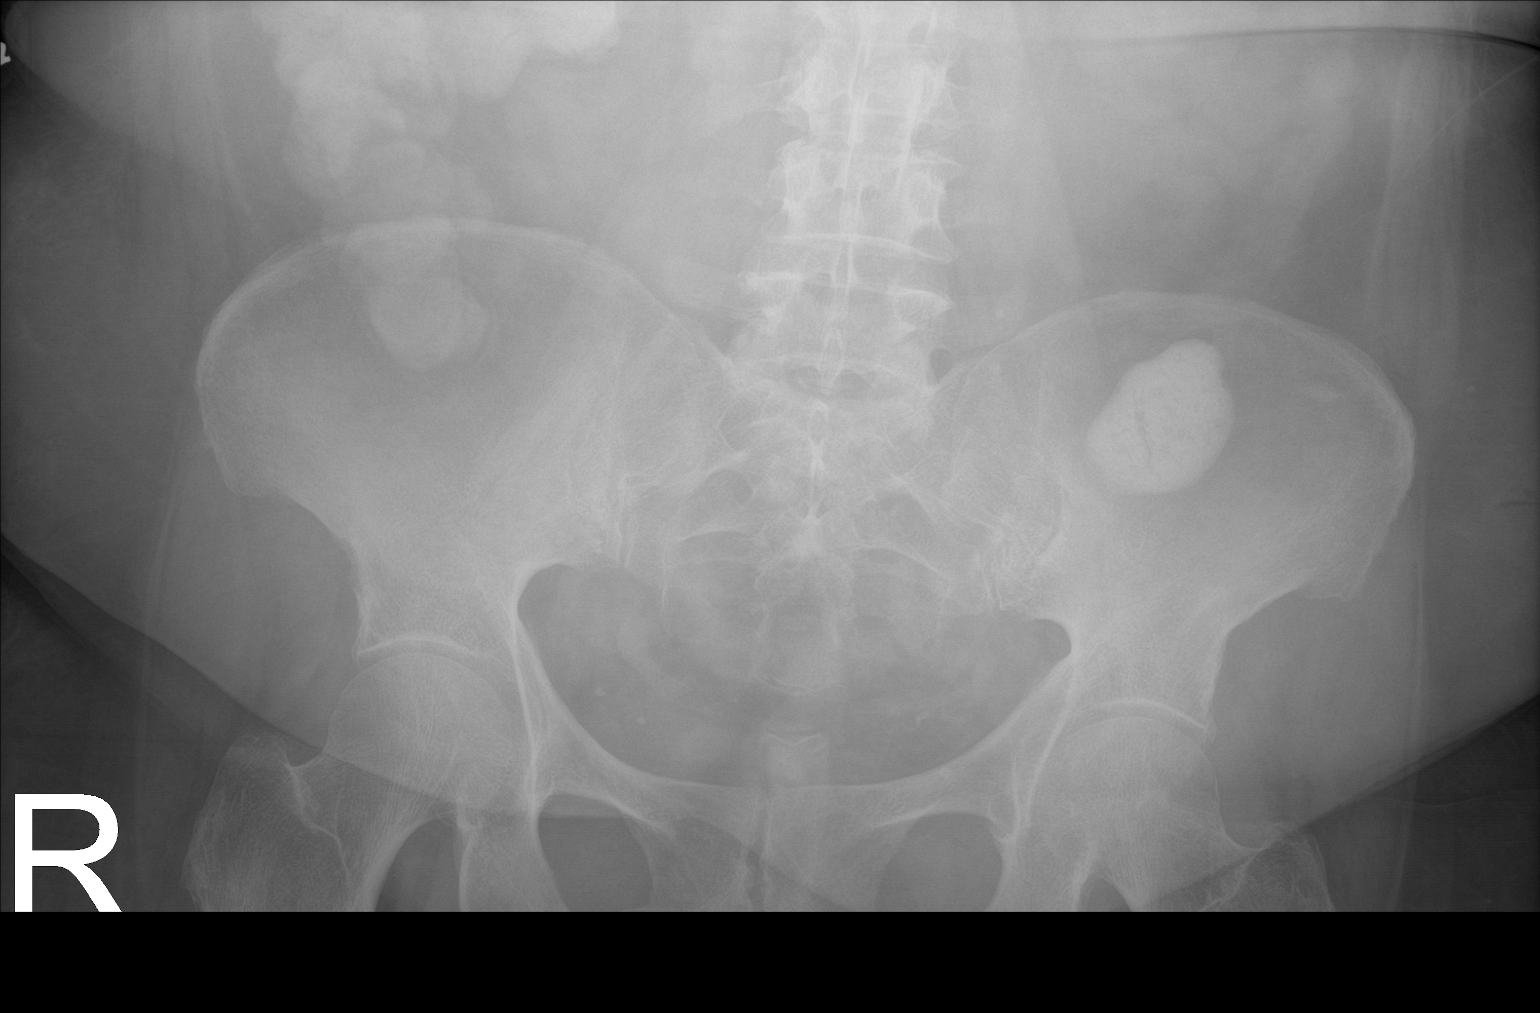

[2 of 2 positions shown; findings below may reference images not displayed]

FINDINGS: NG tube curls in the right upper quadrant probably within the
duodenum bulb. Multiple surgical clips and sutures are present near
the gastroesophageal junction. The bowel gas pattern is nonspecific.
Some contrast is present within the nondistended colon.
IMPRESSION: NG tube tip probably within duodenal bulb. Nonspecific bowel gas
pattern.

## 2018-01-11 ENCOUNTER — Other Ambulatory Visit: Payer: Self-pay | Admitting: Internal Medicine

## 2018-01-21 ENCOUNTER — Other Ambulatory Visit: Payer: Self-pay | Admitting: Internal Medicine

## 2018-02-04 ENCOUNTER — Other Ambulatory Visit (INDEPENDENT_AMBULATORY_CARE_PROVIDER_SITE_OTHER): Payer: Medicare Other

## 2018-02-04 DIAGNOSIS — E785 Hyperlipidemia, unspecified: Secondary | ICD-10-CM

## 2018-02-04 DIAGNOSIS — K5901 Slow transit constipation: Secondary | ICD-10-CM | POA: Diagnosis not present

## 2018-02-04 DIAGNOSIS — I10 Essential (primary) hypertension: Secondary | ICD-10-CM

## 2018-02-04 LAB — CBC WITH DIFFERENTIAL/PLATELET
BASOS PCT: 1.2 % (ref 0.0–3.0)
Basophils Absolute: 0.1 10*3/uL (ref 0.0–0.1)
Eosinophils Absolute: 0.2 10*3/uL (ref 0.0–0.7)
Eosinophils Relative: 2.3 % (ref 0.0–5.0)
HEMATOCRIT: 38.5 % (ref 36.0–46.0)
Hemoglobin: 12.6 g/dL (ref 12.0–15.0)
LYMPHS ABS: 2.2 10*3/uL (ref 0.7–4.0)
LYMPHS PCT: 26.4 % (ref 12.0–46.0)
MCHC: 32.7 g/dL (ref 30.0–36.0)
MCV: 89.7 fl (ref 78.0–100.0)
Monocytes Absolute: 0.6 10*3/uL (ref 0.1–1.0)
Monocytes Relative: 6.7 % (ref 3.0–12.0)
NEUTROS ABS: 5.2 10*3/uL (ref 1.4–7.7)
NEUTROS PCT: 63.4 % (ref 43.0–77.0)
PLATELETS: 289 10*3/uL (ref 150.0–400.0)
RBC: 4.29 Mil/uL (ref 3.87–5.11)
RDW: 14.2 % (ref 11.5–15.5)
WBC: 8.3 10*3/uL (ref 4.0–10.5)

## 2018-02-04 LAB — URINALYSIS
BILIRUBIN URINE: NEGATIVE
HGB URINE DIPSTICK: NEGATIVE
KETONES UR: NEGATIVE
LEUKOCYTES UA: NEGATIVE
Nitrite: NEGATIVE
Specific Gravity, Urine: 1.025 (ref 1.000–1.030)
Total Protein, Urine: NEGATIVE
UROBILINOGEN UA: 0.2 (ref 0.0–1.0)
Urine Glucose: NEGATIVE
pH: 5.5 (ref 5.0–8.0)

## 2018-02-04 LAB — BASIC METABOLIC PANEL
BUN: 13 mg/dL (ref 6–23)
CO2: 27 mEq/L (ref 19–32)
CREATININE: 0.88 mg/dL (ref 0.40–1.20)
Calcium: 9.4 mg/dL (ref 8.4–10.5)
Chloride: 107 mEq/L (ref 96–112)
GFR: 67.08 mL/min (ref >60.00)
Glucose, Bld: 95 mg/dL (ref 70–99)
Potassium: 4.1 mEq/L (ref 3.5–5.1)
Sodium: 141 mEq/L (ref 135–145)

## 2018-02-04 LAB — HEPATIC FUNCTION PANEL
ALK PHOS: 95 U/L (ref 39–117)
ALT: 11 U/L (ref 0–35)
AST: 10 U/L (ref 0–37)
Albumin: 4 g/dL (ref 3.5–5.2)
BILIRUBIN DIRECT: 0.1 mg/dL (ref 0.0–0.3)
BILIRUBIN TOTAL: 0.7 mg/dL (ref 0.2–1.2)
Total Protein: 7 g/dL (ref 6.0–8.3)

## 2018-02-04 LAB — LDL CHOLESTEROL, DIRECT: LDL DIRECT: 110 mg/dL

## 2018-02-04 LAB — LIPID PANEL
CHOLESTEROL: 193 mg/dL (ref 0–200)
HDL: 42.5 mg/dL (ref >39.00)
NonHDL: 150.84
Total CHOL/HDL Ratio: 5
Triglycerides: 252 mg/dL — ABNORMAL HIGH (ref 0.0–149.0)
VLDL: 50.4 mg/dL — AB (ref 0.0–40.0)

## 2018-02-04 LAB — TSH: TSH: 1.26 u[IU]/mL (ref 0.35–4.50)

## 2018-02-19 ENCOUNTER — Ambulatory Visit (INDEPENDENT_AMBULATORY_CARE_PROVIDER_SITE_OTHER)
Admission: RE | Admit: 2018-02-19 | Discharge: 2018-02-19 | Disposition: A | Discharge status: Home / Self Care | Payer: Medicare Other | Source: Ambulatory Visit | Attending: Internal Medicine | Admitting: Internal Medicine

## 2018-02-19 ENCOUNTER — Other Ambulatory Visit (INDEPENDENT_AMBULATORY_CARE_PROVIDER_SITE_OTHER): Payer: Medicare Other

## 2018-02-19 ENCOUNTER — Ambulatory Visit: Payer: Medicare Other | Admitting: Internal Medicine

## 2018-02-19 ENCOUNTER — Encounter: Payer: Self-pay | Admitting: Internal Medicine

## 2018-02-19 VITALS — BP 120/80 | HR 85 | Ht 60.0 in | Wt 196.0 lb

## 2018-02-19 DIAGNOSIS — I10 Essential (primary) hypertension: Secondary | ICD-10-CM

## 2018-02-19 DIAGNOSIS — G4489 Other headache syndrome: Secondary | ICD-10-CM

## 2018-02-19 DIAGNOSIS — H538 Other visual disturbances: Secondary | ICD-10-CM

## 2018-02-19 DIAGNOSIS — Z23 Encounter for immunization: Secondary | ICD-10-CM

## 2018-02-19 DIAGNOSIS — G441 Vascular headache, not elsewhere classified: Secondary | ICD-10-CM

## 2018-02-19 LAB — SEDIMENTATION RATE: Sed Rate: 14 mm/hr (ref 0–30)

## 2018-02-19 MED ORDER — ONDANSETRON HCL 4 MG PO TABS: 4.0000 mg | ORAL_TABLET | Freq: Three times a day (TID) | ORAL | 0 refills | Status: DC | PRN

## 2018-02-19 MED ORDER — SUMATRIPTAN SUCCINATE 100 MG PO TABS: 100.0000 mg | ORAL_TABLET | ORAL | 1 refills | Status: DC | PRN

## 2018-02-19 MED ORDER — BUTALBITAL-ASA-CAFF-CODEINE 50-325-40-30 MG PO CAPS: 1.0000 | ORAL_CAPSULE | ORAL | 0 refills | Status: DC | PRN

## 2018-02-19 NOTE — Assessment & Plan Note (Signed)
Probably migraine vs other CT head ASAP Ophth ref ASAP Imitrex Fioricet - cod prn Labs

## 2018-02-19 NOTE — Assessment & Plan Note (Signed)
Vearpamil

## 2018-02-19 NOTE — Progress Notes (Signed)
Subjective:  Patient ID: [name], female    DOB: [birth year]  Age: 72 y.o. MRN: [id number]  CC: Headache (x 2 weeks. Has taken Ibuprofe, APAP and some left over Hydroco/APAP)   HPI [name] presents for a HA x 1 week - 4/10 now. It was 10/10 in the evenings. Tylenol helped some. She was nauseated. L>R. No blurred vision now but she had it at night.  H/o migraines  Outpatient Medications Prior to Visit  Medication Sig Dispense Refill  . ALPRAZolam (XANAX) 0.5 MG tablet Take 1 tablet (0.5 mg total) by mouth 2 (two) times daily as needed. 60 tablet 3  . aspirin 81 MG tablet Take 81 mg by mouth daily.      Marland Kitchen buPROPion (WELLBUTRIN XL) 150 MG 24 hr tablet Take 1 tablet (150 mg total) by mouth daily. 90 tablet 3  . Cholecalciferol (VITAMIN D) 2000 UNITS CAPS Take 1 capsule by mouth daily.      . fish oil-omega-3 fatty acids 1000 MG capsule Take by mouth daily. Take  600 mg daily    . fluticasone (FLONASE) 50 MCG/ACT nasal spray Place 2 sprays into both nostrils daily. 48 g 1  . gabapentin (NEURONTIN) 100 MG capsule TAKE 1 CAPSULE BY MOUTH AT  BEDTIME 90 capsule 3  . LINZESS 290 MCG CAPS capsule TAKE 1 CAPSULE BY MOUTH  DAILY BEFORE BREAKFAST 90 capsule 1  . lovastatin (MEVACOR) 40 MG tablet TAKE 1 TABLET BY MOUTH AT  BEDTIME 90 tablet 1  . Multiple Vitamins-Minerals (OCUVITE PO) Take 2 tablets by mouth daily.    Marland Kitchen omeprazole (PRILOSEC) 40 MG capsule TAKE 1 CAPSULE BY MOUTH  TWICE DAILY BEFORE MEALS 180 capsule 1  . polyethylene glycol (MIRALAX) packet Take 17 g by mouth daily. 14 each 0  . Probiotic Product (ALIGN) 4 MG CAPS Take 1 capsule (4 mg total) by mouth daily. 30 capsule 1  . verapamil (CALAN-SR) 240 MG CR tablet TAKE ONE TABLET BY MOUTH AT BEDTIME 90 tablet 1  . VESICARE 10 MG tablet Take 10 mg by mouth daily.     . vitamin B-12 (CYANOCOBALAMIN) 1000 MCG tablet Take 500 mcg by mouth daily.     . traZODone (DESYREL) 50 MG tablet Take 1 tablet (50 mg total) by mouth at bedtime.  90 tablet 1  . ciprofloxacin-dexamethasone (CIPRODEX) OTIC suspension Place 3 drops into the right ear 3 (three) times daily. (Patient not taking: Reported on 02/19/2018) 7.5 mL 2   No facility-administered medications prior to visit.     ROS: Review of Systems  Constitutional: Negative for activity change, appetite change, chills, fatigue and unexpected weight change.  HENT: Negative for congestion, mouth sores and sinus pressure.   Eyes: Positive for visual disturbance.  Respiratory: Negative for cough and chest tightness.   Gastrointestinal: Negative for abdominal pain and nausea.  Genitourinary: Negative for difficulty urinating, frequency and vaginal pain.  Musculoskeletal: Negative for back pain and gait problem.  Skin: Negative for pallor and rash.  Neurological: Positive for headaches. Negative for dizziness, tremors, weakness and numbness.  Psychiatric/Behavioral: Negative for confusion and sleep disturbance.    Objective:  BP 120/80 (BP Location: Left Arm, Patient Position: Sitting, Cuff Size: Large)   Pulse 85   Ht 5' (1.524 m)   Wt 196 lb (88.9 kg)   SpO2 97%   BMI 38.28 kg/m   BP Readings from Last 3 Encounters:  02/19/18 120/80  10/22/17 128/76  09/02/17 (!) 156/83  Wt Readings from Last 3 Encounters:  02/19/18 196 lb (88.9 kg)  10/22/17 192 lb (87.1 kg)  09/02/17 194 lb (88 kg)    Physical Exam  Constitutional: She appears well-developed. No distress.  HENT:  Head: Normocephalic.  Right Ear: External ear normal.  Left Ear: External ear normal.  Nose: Nose normal.  Mouth/Throat: Oropharynx is clear and moist.  Eyes: Pupils are equal, round, and reactive to light. Conjunctivae are normal. Right eye exhibits no discharge. Left eye exhibits no discharge.  Neck: Normal range of motion. Neck supple. No JVD present. No tracheal deviation present. No thyromegaly present.  Cardiovascular: Normal rate, regular rhythm and normal heart sounds.  Pulmonary/Chest:  No stridor. No respiratory distress. She has no wheezes.  Abdominal: Soft. Bowel sounds are normal. She exhibits no distension and no mass. There is no tenderness. There is no rebound and no guarding.  Musculoskeletal: She exhibits no edema or tenderness.  Lymphadenopathy:    She has no cervical adenopathy.  Neurological: She displays normal reflexes. No cranial nerve deficit. She exhibits normal muscle tone. Coordination normal.  Skin: No rash noted. No erythema.  Psychiatric: She has a normal mood and affect. Her behavior is normal. Judgment and thought content normal.  Non-focal CN 2-12 Skull NT Temples and TMJs WNL No rash No ataxia  Lab Results  Component Value Date   WBC 8.3 02/04/2018   HGB 12.6 02/04/2018   HCT 38.5 02/04/2018   PLT 289.0 02/04/2018   GLUCOSE 95 02/04/2018   CHOL 193 02/04/2018   TRIG 252.0 (H) 02/04/2018   HDL 42.50 02/04/2018   LDLDIRECT 110.0 02/04/2018   LDLCALC 102 (H) 01/16/2017   ALT 11 02/04/2018   AST 10 02/04/2018   NA 141 02/04/2018   K 4.1 02/04/2018   CL 107 02/04/2018   CREATININE 0.88 02/04/2018   BUN 13 02/04/2018   CO2 27 02/04/2018   TSH 1.26 02/04/2018   INR 0.99 01/26/2017    Mm Screening Breast Tomo Bilateral  Result Date: 10/01/2017 CLINICAL DATA:  Screening. EXAM: DIGITAL SCREENING BILATERAL MAMMOGRAM WITH TOMO AND CAD COMPARISON:  Previous exam(s). ACR Breast Density Category b: There are scattered areas of fibroglandular density. FINDINGS: There are no findings suspicious for malignancy. Images were processed with CAD. IMPRESSION: No mammographic evidence of malignancy. A result letter of this screening mammogram will be mailed directly to the patient. RECOMMENDATION: Screening mammogram in one year. (Code:SM-B-01Y) BI-RADS CATEGORY  1: Negative. Electronically Signed   By: Margarette Canada [name]   On: 10/01/2017 15:46    Assessment & Plan:   Kaleea was seen today for headache.  Diagnoses and all orders for this visit:  Need  for influenza vaccination -     Flu vaccine HIGH DOSE PF     No orders of the defined types were placed in this encounter.    Follow-up: No follow-ups on file.  Walker Kehr, MD

## 2018-02-19 NOTE — Patient Instructions (Signed)
Go to ER if worse 

## 2018-02-22 ENCOUNTER — Other Ambulatory Visit: Payer: Self-pay | Admitting: Internal Medicine

## 2018-02-22 MED ORDER — AMOXICILLIN-POT CLAVULANATE 875-125 MG PO TABS: 1.0000 | ORAL_TABLET | Freq: Two times a day (BID) | ORAL | 1 refills | Status: DC

## 2018-02-22 MED ORDER — FLUCONAZOLE 150 MG PO TABS: 150.0000 mg | ORAL_TABLET | Freq: Once | ORAL | 1 refills | Status: AC

## 2018-02-26 ENCOUNTER — Ambulatory Visit: Payer: Medicare Other | Admitting: Internal Medicine

## 2018-02-26 ENCOUNTER — Encounter: Payer: Self-pay | Admitting: Internal Medicine

## 2018-02-26 VITALS — BP 118/70 | HR 78 | Temp 98.2°F | Ht 60.0 in | Wt 198.0 lb

## 2018-02-26 DIAGNOSIS — I10 Essential (primary) hypertension: Secondary | ICD-10-CM | POA: Diagnosis not present

## 2018-02-26 DIAGNOSIS — J323 Chronic sphenoidal sinusitis: Secondary | ICD-10-CM | POA: Diagnosis not present

## 2018-02-26 DIAGNOSIS — G4489 Other headache syndrome: Secondary | ICD-10-CM | POA: Diagnosis not present

## 2018-02-26 DIAGNOSIS — Z23 Encounter for immunization: Secondary | ICD-10-CM

## 2018-02-26 NOTE — Progress Notes (Signed)
Subjective:  Patient ID: [name], female    DOB: 1946/05/28  Age: 72 y.o. MRN: [id number]  CC: No chief complaint on file.   HPI [name] presents for HAs and sinusitis. HA is 50% better on Augmentin since Sunday  Outpatient Medications Prior to Visit  Medication Sig Dispense Refill  . ALPRAZolam (XANAX) 0.5 MG tablet Take 1 tablet (0.5 mg total) by mouth 2 (two) times daily as needed. 60 tablet 3  . amoxicillin-clavulanate (AUGMENTIN) 875-125 MG tablet Take 1 tablet by mouth 2 (two) times daily. 30 tablet 1  . aspirin 81 MG tablet Take 81 mg by mouth daily.      Marland Kitchen buPROPion (WELLBUTRIN XL) 150 MG 24 hr tablet Take 1 tablet (150 mg total) by mouth daily. 90 tablet 3  . butalbital-aspirin-caffeine-codeine (FIORINAL/CODEINE #3) 50-325-40-30 MG capsule Take 1 capsule by mouth every 4 (four) hours as needed for headache or migraine. 20 capsule 0  . Cholecalciferol (VITAMIN D) 2000 UNITS CAPS Take 1 capsule by mouth daily.      . fish oil-omega-3 fatty acids 1000 MG capsule Take by mouth daily. Take  600 mg daily    . fluticasone (FLONASE) 50 MCG/ACT nasal spray Place 2 sprays into both nostrils daily. 48 g 1  . gabapentin (NEURONTIN) 100 MG capsule TAKE 1 CAPSULE BY MOUTH AT  BEDTIME 90 capsule 3  . LINZESS 290 MCG CAPS capsule TAKE 1 CAPSULE BY MOUTH  DAILY BEFORE BREAKFAST 90 capsule 1  . lovastatin (MEVACOR) 40 MG tablet TAKE 1 TABLET BY MOUTH AT  BEDTIME 90 tablet 1  . Multiple Vitamins-Minerals (OCUVITE PO) Take 2 tablets by mouth daily.    Marland Kitchen omeprazole (PRILOSEC) 40 MG capsule TAKE 1 CAPSULE BY MOUTH  TWICE DAILY BEFORE MEALS 180 capsule 1  . ondansetron (ZOFRAN) 4 MG tablet Take 1 tablet (4 mg total) by mouth every 8 (eight) hours as needed for nausea or vomiting. 12 tablet 0  . polyethylene glycol (MIRALAX) packet Take 17 g by mouth daily. 14 each 0  . Probiotic Product (ALIGN) 4 MG CAPS Take 1 capsule (4 mg total) by mouth daily. 30 capsule 1  . SUMAtriptan (IMITREX) 100  MG tablet Take 1 tablet (100 mg total) by mouth every 2 (two) hours as needed for migraine. May repeat in 2 hours if headache persists or recurs. 10 tablet 1  . verapamil (CALAN-SR) 240 MG CR tablet TAKE ONE TABLET BY MOUTH AT BEDTIME 90 tablet 1  . VESICARE 10 MG tablet Take 10 mg by mouth daily.     . vitamin B-12 (CYANOCOBALAMIN) 1000 MCG tablet Take 500 mcg by mouth daily.     . traZODone (DESYREL) 50 MG tablet Take 1 tablet (50 mg total) by mouth at bedtime. 90 tablet 1   No facility-administered medications prior to visit.     ROS: Review of Systems  Constitutional: Negative for activity change, appetite change, chills, fatigue and unexpected weight change.  HENT: Positive for congestion and voice change. Negative for mouth sores and sinus pressure.   Eyes: Negative for visual disturbance.  Respiratory: Negative for cough and chest tightness.   Gastrointestinal: Negative for abdominal pain and nausea.  Genitourinary: Negative for difficulty urinating, frequency and vaginal pain.  Musculoskeletal: Negative for back pain and gait problem.  Skin: Negative for pallor and rash.  Neurological: Positive for headaches. Negative for dizziness, tremors, weakness and numbness.  Psychiatric/Behavioral: Negative for confusion and sleep disturbance.    Objective:  BP  118/70 (BP Location: Left Arm, Patient Position: Sitting, Cuff Size: Large)   Pulse 78   Temp 98.2 F (36.8 C) (Oral)   Ht 5' (1.524 m)   Wt 198 lb (89.8 kg)   SpO2 97%   BMI 38.67 kg/m   BP Readings from Last 3 Encounters:  02/26/18 118/70  02/19/18 120/80  10/22/17 128/76    Wt Readings from Last 3 Encounters:  02/26/18 198 lb (89.8 kg)  02/19/18 196 lb (88.9 kg)  10/22/17 192 lb (87.1 kg)    Physical Exam  Constitutional: She appears well-developed. No distress.  HENT:  Head: Normocephalic.  Right Ear: External ear normal.  Left Ear: External ear normal.  Nose: Nose normal.  Mouth/Throat: Oropharynx is  clear and moist.  Eyes: Pupils are equal, round, and reactive to light. Conjunctivae are normal. Right eye exhibits no discharge. Left eye exhibits no discharge.  Neck: Normal range of motion. Neck supple. No JVD present. No tracheal deviation present. No thyromegaly present.  Cardiovascular: Normal rate, regular rhythm and normal heart sounds.  Pulmonary/Chest: No stridor. No respiratory distress. She has no wheezes.  Abdominal: Soft. Bowel sounds are normal. She exhibits no distension and no mass. There is no tenderness. There is no rebound and no guarding.  Musculoskeletal: She exhibits no edema or tenderness.  Lymphadenopathy:    She has no cervical adenopathy.  Neurological: She displays normal reflexes. No cranial nerve deficit. She exhibits normal muscle tone. Coordination normal.  Skin: No rash noted. No erythema.  Psychiatric: She has a normal mood and affect. Her behavior is normal. Judgment and thought content normal.    Lab Results  Component Value Date   WBC 8.3 02/04/2018   HGB 12.6 02/04/2018   HCT 38.5 02/04/2018   PLT 289.0 02/04/2018   GLUCOSE 95 02/04/2018   CHOL 193 02/04/2018   TRIG 252.0 (H) 02/04/2018   HDL 42.50 02/04/2018   LDLDIRECT 110.0 02/04/2018   LDLCALC 102 (H) 01/16/2017   ALT 11 02/04/2018   AST 10 02/04/2018   NA 141 02/04/2018   K 4.1 02/04/2018   CL 107 02/04/2018   CREATININE 0.88 02/04/2018   BUN 13 02/04/2018   CO2 27 02/04/2018   TSH 1.26 02/04/2018   INR 0.99 01/26/2017    Ct Head Wo Contrast  Result Date: 02/20/2018 CLINICAL DATA:  72 year old with headache and blurred vision. EXAM: CT HEAD WITHOUT CONTRAST TECHNIQUE: Contiguous axial images were obtained from the base of the skull through the vertex without intravenous contrast. COMPARISON:  11/25/2008 FINDINGS: Brain: No evidence for acute hemorrhage, mass lesion, midline shift, hydrocephalus or large infarct. Slightly decreased attenuation in the white matter along the left frontal  horn and near the left external capsule. Findings probably represent old ischemic changes and stable. Vascular: No hyperdense vessel or unexpected calcification. Skull: Normal. Negative for fracture or focal lesion. Sinuses/Orbits: Chronic sinus disease in the sphenoid sinus. Other: None. IMPRESSION: No acute intracranial abnormality. Chronic sphenoid sinus disease. Electronically Signed   By: Markus Daft [name]   On: 02/20/2018 08:31    Assessment & Plan:   There are no diagnoses linked to this encounter.   No orders of the defined types were placed in this encounter.    Follow-up: No follow-ups on file.  Walker Kehr, MD

## 2018-02-26 NOTE — Addendum Note (Signed)
Addended by: Karren Cobble on: 02/26/2018 11:47 AM   Modules accepted: Orders

## 2018-02-26 NOTE — Assessment & Plan Note (Signed)
HA is 50% better on Augmentin since Sunday

## 2018-02-26 NOTE — Assessment & Plan Note (Signed)
BP Readings from Last 3 Encounters:  02/26/18 118/70  02/19/18 120/80  10/22/17 128/76

## 2018-03-04 ENCOUNTER — Encounter: Payer: Medicare Other | Admitting: Internal Medicine

## 2018-03-11 ENCOUNTER — Other Ambulatory Visit: Payer: Self-pay | Admitting: Internal Medicine

## 2018-03-17 ENCOUNTER — Other Ambulatory Visit: Payer: Self-pay | Admitting: Internal Medicine

## 2018-03-25 ENCOUNTER — Encounter: Payer: Self-pay | Admitting: Internal Medicine

## 2018-03-25 ENCOUNTER — Ambulatory Visit: Payer: Medicare Other | Admitting: Internal Medicine

## 2018-03-25 DIAGNOSIS — G4489 Other headache syndrome: Secondary | ICD-10-CM | POA: Diagnosis not present

## 2018-03-25 MED ORDER — SUMATRIPTAN SUCCINATE 100 MG PO TABS: 100.0000 mg | ORAL_TABLET | ORAL | 2 refills | Status: DC | PRN

## 2018-03-25 MED ORDER — BUTALBITAL-ASA-CAFF-CODEINE 50-325-40-30 MG PO CAPS: 1.0000 | ORAL_CAPSULE | ORAL | 0 refills | Status: DC | PRN

## 2018-03-25 NOTE — Assessment & Plan Note (Signed)
Sporadic. Better after abx Butalbit/caff/cod prn Probably migraine vs other Imitrex prn

## 2018-03-25 NOTE — Progress Notes (Signed)
Subjective:  Patient ID: [name], female    DOB: Oct 24, 1945  Age: 72 y.o. MRN: [id number]  CC: No chief complaint on file.   HPI BETHSAIDA SIEGENTHALER presents for HAs - much better after abx (80% better) C/o occ raspy voice  Outpatient Medications Prior to Visit  Medication Sig Dispense Refill  . ALPRAZolam (XANAX) 0.5 MG tablet Take 1 tablet (0.5 mg total) by mouth 2 (two) times daily as needed. 60 tablet 3  . aspirin 81 MG tablet Take 81 mg by mouth daily.      Marland Kitchen buPROPion (WELLBUTRIN SR) 150 MG 12 hr tablet TAKE 1 TABLET BY MOUTH TWO  TIMES DAILY 180 tablet 3  . buPROPion (WELLBUTRIN XL) 150 MG 24 hr tablet Take 1 tablet (150 mg total) by mouth daily. 90 tablet 3  . butalbital-aspirin-caffeine-codeine (FIORINAL/CODEINE #3) 50-325-40-30 MG capsule Take 1 capsule by mouth every 4 (four) hours as needed for headache or migraine. 20 capsule 0  . Cholecalciferol (VITAMIN D) 2000 UNITS CAPS Take 1 capsule by mouth daily.      . fish oil-omega-3 fatty acids 1000 MG capsule Take by mouth daily. Take  600 mg daily    . fluticasone (FLONASE) 50 MCG/ACT nasal spray Place 2 sprays into both nostrils daily. 48 g 1  . gabapentin (NEURONTIN) 100 MG capsule TAKE 1 CAPSULE BY MOUTH AT  BEDTIME 90 capsule 3  . LINZESS 290 MCG CAPS capsule TAKE 1 CAPSULE BY MOUTH  DAILY BEFORE BREAKFAST 90 capsule 1  . lovastatin (MEVACOR) 40 MG tablet TAKE 1 TABLET BY MOUTH AT  BEDTIME 90 tablet 1  . Multiple Vitamins-Minerals (OCUVITE PO) Take 2 tablets by mouth daily.    Marland Kitchen omeprazole (PRILOSEC) 40 MG capsule TAKE 1 CAPSULE BY MOUTH  TWICE DAILY BEFORE MEALS 180 capsule 0  . ondansetron (ZOFRAN) 4 MG tablet Take 1 tablet (4 mg total) by mouth every 8 (eight) hours as needed for nausea or vomiting. 12 tablet 0  . polyethylene glycol (MIRALAX) packet Take 17 g by mouth daily. 14 each 0  . Probiotic Product (ALIGN) 4 MG CAPS Take 1 capsule (4 mg total) by mouth daily. 30 capsule 1  . SUMAtriptan (IMITREX) 100 MG tablet  Take 1 tablet (100 mg total) by mouth every 2 (two) hours as needed for migraine. May repeat in 2 hours if headache persists or recurs. 10 tablet 1  . verapamil (CALAN-SR) 240 MG CR tablet TAKE ONE TABLET BY MOUTH AT BEDTIME 90 tablet 1  . VESICARE 10 MG tablet Take 10 mg by mouth daily.     . vitamin B-12 (CYANOCOBALAMIN) 1000 MCG tablet Take 500 mcg by mouth daily.     . traZODone (DESYREL) 50 MG tablet Take 1 tablet (50 mg total) by mouth at bedtime. 90 tablet 1  . amoxicillin-clavulanate (AUGMENTIN) 875-125 MG tablet Take 1 tablet by mouth 2 (two) times daily. 30 tablet 1   No facility-administered medications prior to visit.     ROS: Review of Systems  Constitutional: Negative for activity change, appetite change, chills, fatigue and unexpected weight change.  HENT: Negative for congestion, mouth sores, sinus pressure and sinus pain.   Eyes: Negative for visual disturbance.  Respiratory: Positive for cough. Negative for chest tightness and shortness of breath.   Gastrointestinal: Negative for abdominal pain and nausea.  Genitourinary: Negative for difficulty urinating, frequency and vaginal pain.  Musculoskeletal: Negative for back pain and gait problem.  Skin: Negative for pallor and rash.  Neurological: Positive for headaches. Negative for dizziness, tremors, weakness and numbness.  Psychiatric/Behavioral: Negative for confusion and sleep disturbance.    Objective:  BP 124/78 (BP Location: Left Arm, Patient Position: Sitting, Cuff Size: Normal)   Pulse 72   Temp 97.8 F (36.6 C) (Oral)   Ht 5' (1.524 m)   Wt 198 lb (89.8 kg)   SpO2 95%   BMI 38.67 kg/m   BP Readings from Last 3 Encounters:  03/25/18 124/78  02/26/18 118/70  02/19/18 120/80    Wt Readings from Last 3 Encounters:  03/25/18 198 lb (89.8 kg)  02/26/18 198 lb (89.8 kg)  02/19/18 196 lb (88.9 kg)    Physical Exam  Constitutional: She appears well-developed. No distress.  HENT:  Head: Normocephalic.    Right Ear: External ear normal.  Left Ear: External ear normal.  Nose: Nose normal.  Mouth/Throat: Oropharynx is clear and moist.  Eyes: Pupils are equal, round, and reactive to light. Conjunctivae are normal. Right eye exhibits no discharge. Left eye exhibits no discharge.  Neck: Normal range of motion. Neck supple. No JVD present. No tracheal deviation present. No thyromegaly present.  Cardiovascular: Normal rate, regular rhythm and normal heart sounds.  Pulmonary/Chest: No stridor. No respiratory distress. She has no wheezes.  Abdominal: Soft. Bowel sounds are normal. She exhibits no distension and no mass. There is no tenderness. There is no rebound and no guarding.  Musculoskeletal: She exhibits no edema or tenderness.  Lymphadenopathy:    She has no cervical adenopathy.  Neurological: She displays normal reflexes. No cranial nerve deficit. She exhibits normal muscle tone. Coordination normal.  Skin: No rash noted. No erythema.  Psychiatric: She has a normal mood and affect. Her behavior is normal. Judgment and thought content normal.  obese  Lab Results  Component Value Date   WBC 8.3 02/04/2018   HGB 12.6 02/04/2018   HCT 38.5 02/04/2018   PLT 289.0 02/04/2018   GLUCOSE 95 02/04/2018   CHOL 193 02/04/2018   TRIG 252.0 (H) 02/04/2018   HDL 42.50 02/04/2018   LDLDIRECT 110.0 02/04/2018   LDLCALC 102 (H) 01/16/2017   ALT 11 02/04/2018   AST 10 02/04/2018   NA 141 02/04/2018   K 4.1 02/04/2018   CL 107 02/04/2018   CREATININE 0.88 02/04/2018   BUN 13 02/04/2018   CO2 27 02/04/2018   TSH 1.26 02/04/2018   INR 0.99 01/26/2017    Ct Head Wo Contrast  Result Date: 02/20/2018 CLINICAL DATA:  72 year old with headache and blurred vision. EXAM: CT HEAD WITHOUT CONTRAST TECHNIQUE: Contiguous axial images were obtained from the base of the skull through the vertex without intravenous contrast. COMPARISON:  11/25/2008 FINDINGS: Brain: No evidence for acute hemorrhage, mass  lesion, midline shift, hydrocephalus or large infarct. Slightly decreased attenuation in the white matter along the left frontal horn and near the left external capsule. Findings probably represent old ischemic changes and stable. Vascular: No hyperdense vessel or unexpected calcification. Skull: Normal. Negative for fracture or focal lesion. Sinuses/Orbits: Chronic sinus disease in the sphenoid sinus. Other: None. IMPRESSION: No acute intracranial abnormality. Chronic sphenoid sinus disease. Electronically Signed   By: Markus Daft [name]   On: 02/20/2018 08:31    Assessment & Plan:   There are no diagnoses linked to this encounter.   No orders of the defined types were placed in this encounter.    Follow-up: No follow-ups on file.  Walker Kehr, MD

## 2018-04-16 ENCOUNTER — Encounter: Payer: Self-pay | Admitting: Internal Medicine

## 2018-04-16 ENCOUNTER — Ambulatory Visit: Payer: Medicare Other | Admitting: Internal Medicine

## 2018-04-16 ENCOUNTER — Other Ambulatory Visit (INDEPENDENT_AMBULATORY_CARE_PROVIDER_SITE_OTHER): Payer: Medicare Other

## 2018-04-16 VITALS — BP 126/80 | HR 74 | Temp 98.2°F | Ht 60.0 in | Wt 197.0 lb

## 2018-04-16 DIAGNOSIS — G4459 Other complicated headache syndrome: Secondary | ICD-10-CM

## 2018-04-16 DIAGNOSIS — G4489 Other headache syndrome: Secondary | ICD-10-CM

## 2018-04-16 LAB — CBC WITH DIFFERENTIAL/PLATELET
BASOS PCT: 1.3 % (ref 0.0–3.0)
Basophils Absolute: 0.1 10*3/uL (ref 0.0–0.1)
EOS PCT: 2.6 % (ref 0.0–5.0)
Eosinophils Absolute: 0.2 10*3/uL (ref 0.0–0.7)
HCT: 38.4 % (ref 36.0–46.0)
Hemoglobin: 12.7 g/dL (ref 12.0–15.0)
LYMPHS ABS: 2.2 10*3/uL (ref 0.7–4.0)
Lymphocytes Relative: 30.9 % (ref 12.0–46.0)
MCHC: 32.9 g/dL (ref 30.0–36.0)
MCV: 89.9 fl (ref 78.0–100.0)
MONOS PCT: 7.3 % (ref 3.0–12.0)
Monocytes Absolute: 0.5 10*3/uL (ref 0.1–1.0)
NEUTROS ABS: 4.1 10*3/uL (ref 1.4–7.7)
NEUTROS PCT: 57.9 % (ref 43.0–77.0)
PLATELETS: 271 10*3/uL (ref 150.0–400.0)
RBC: 4.27 Mil/uL (ref 3.87–5.11)
RDW: 14 % (ref 11.5–15.5)
WBC: 7 10*3/uL (ref 4.0–10.5)

## 2018-04-16 LAB — BASIC METABOLIC PANEL
BUN: 14 mg/dL (ref 6–23)
CALCIUM: 9.2 mg/dL (ref 8.4–10.5)
CO2: 29 meq/L (ref 19–32)
CREATININE: 0.8 mg/dL (ref 0.40–1.20)
Chloride: 105 mEq/L (ref 96–112)
GFR: 74.84 mL/min (ref >60.00)
Glucose, Bld: 99 mg/dL (ref 70–99)
Potassium: 4.5 mEq/L (ref 3.5–5.1)
Sodium: 141 mEq/L (ref 135–145)

## 2018-04-16 LAB — SEDIMENTATION RATE: Sed Rate: 21 mm/hr (ref 0–30)

## 2018-04-16 NOTE — Assessment & Plan Note (Addendum)
New since Sept 2019 Probably migraine vs occip neuralgia vs TA vasculitis vs other discussed Neurol ref. Brain MRI Repeat ESR. Consider TA bx Reduce Wellbutrin SR to qam or stop Occip nerve block was offered To ER if HA relapsed

## 2018-04-16 NOTE — Patient Instructions (Addendum)
Excedrin as needed Rice sock to neck Go to ER if bad

## 2018-04-16 NOTE — Progress Notes (Signed)
Subjective:  Patient ID: [name], female    DOB: [birth year]  Age: 72 y.o. MRN: [id number]  CC: No chief complaint on file.   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 The pt took Imitrex and Fioricet - helped some. C/o L side residual HA now. Pt was in bed all wknd  Outpatient Medications Prior to Visit  Medication Sig Dispense Refill  . ALPRAZolam (XANAX) 0.5 MG tablet Take 1 tablet (0.5 mg total) by mouth 2 (two) times daily as needed. 60 tablet 3  . aspirin 81 MG tablet Take 81 mg by mouth daily.      Marland Kitchen buPROPion (WELLBUTRIN SR) 150 MG 12 hr tablet TAKE 1 TABLET BY MOUTH TWO  TIMES DAILY 180 tablet 3  . buPROPion (WELLBUTRIN XL) 150 MG 24 hr tablet Take 1 tablet (150 mg total) by mouth daily. 90 tablet 3  . butalbital-aspirin-caffeine-codeine (FIORINAL/CODEINE #3) 50-325-40-30 MG capsule Take 1 capsule by mouth every 4 (four) hours as needed for headache or migraine. 20 capsule 0  . Cholecalciferol (VITAMIN D) 2000 UNITS CAPS Take 1 capsule by mouth daily.      . fish oil-omega-3 fatty acids 1000 MG capsule Take by mouth daily. Take  600 mg daily    . fluticasone (FLONASE) 50 MCG/ACT nasal spray Place 2 sprays into both nostrils daily. 48 g 1  . gabapentin (NEURONTIN) 100 MG capsule TAKE 1 CAPSULE BY MOUTH AT  BEDTIME 90 capsule 3  . LINZESS 290 MCG CAPS capsule TAKE 1 CAPSULE BY MOUTH  DAILY BEFORE BREAKFAST 90 capsule 1  . lovastatin (MEVACOR) 40 MG tablet TAKE 1 TABLET BY MOUTH AT  BEDTIME 90 tablet 1  . Multiple Vitamins-Minerals (OCUVITE PO) Take 2 tablets by mouth daily.    Marland Kitchen omeprazole (PRILOSEC) 40 MG capsule TAKE 1 CAPSULE BY MOUTH  TWICE DAILY BEFORE MEALS 180 capsule 0  . ondansetron (ZOFRAN) 4 MG tablet Take 1 tablet (4 mg total) by mouth every 8 (eight) hours as needed for nausea or vomiting. 12 tablet 0  . polyethylene glycol (MIRALAX) packet Take 17 g by mouth daily. 14 each 0  . Probiotic  Product (ALIGN) 4 MG CAPS Take 1 capsule (4 mg total) by mouth daily. 30 capsule 1  . SUMAtriptan (IMITREX) 100 MG tablet Take 1 tablet (100 mg total) by mouth every 2 (two) hours as needed for migraine. May repeat in 2 hours if headache persists or recurs. 10 tablet 2  . verapamil (CALAN-SR) 240 MG CR tablet TAKE ONE TABLET BY MOUTH AT BEDTIME 90 tablet 1  . VESICARE 10 MG tablet Take 10 mg by mouth daily.     . vitamin B-12 (CYANOCOBALAMIN) 1000 MCG tablet Take 500 mcg by mouth daily.     . traZODone (DESYREL) 50 MG tablet Take 1 tablet (50 mg total) by mouth at bedtime. 90 tablet 1   No facility-administered medications prior to visit.     ROS: Review of Systems  Constitutional: Negative for activity change, appetite change, chills, fatigue and unexpected weight change.  HENT: Negative for congestion, mouth sores and sinus pressure.   Eyes: Negative for pain, redness and visual disturbance.  Respiratory: Negative for cough and chest tightness.   Gastrointestinal: Negative for abdominal pain and nausea.  Genitourinary: Negative for difficulty urinating, frequency and vaginal pain.  Musculoskeletal: Negative for arthralgias, back pain and gait problem.  Skin: Negative for pallor and  rash.  Neurological: Positive for headaches. Negative for dizziness, tremors, syncope, weakness and numbness.  Psychiatric/Behavioral: Negative for confusion and sleep disturbance. The patient is not nervous/anxious.     Objective:  BP 126/80 (BP Location: Left Arm, Patient Position: Sitting, Cuff Size: Normal)   Pulse 74   Temp 98.2 F (36.8 C) (Oral)   Ht 5' (1.524 m)   Wt 197 lb (89.4 kg)   SpO2 97%   BMI 38.47 kg/m   BP Readings from Last 3 Encounters:  04/16/18 126/80  03/25/18 124/78  02/26/18 118/70    Wt Readings from Last 3 Encounters:  04/16/18 197 lb (89.4 kg)  03/25/18 198 lb (89.8 kg)  02/26/18 198 lb (89.8 kg)    Physical Exam  Constitutional: She appears well-developed. No  distress.  HENT:  Head: Normocephalic.  Right Ear: External ear normal.  Left Ear: External ear normal.  Nose: Nose normal.  Mouth/Throat: Oropharynx is clear and moist.  Eyes: Pupils are equal, round, and reactive to light. Conjunctivae are normal. Right eye exhibits no discharge. Left eye exhibits no discharge.  Neck: Normal range of motion. Neck supple. No JVD present. No tracheal deviation present. No thyromegaly present.  Cardiovascular: Normal rate, regular rhythm and normal heart sounds.  Pulmonary/Chest: No stridor. No respiratory distress. She has no wheezes.  Abdominal: Soft. Bowel sounds are normal. She exhibits no distension and no mass. There is no tenderness. There is no rebound and no guarding.  Musculoskeletal: She exhibits no edema or tenderness.  Lymphadenopathy:    She has no cervical adenopathy.  Neurological: She displays normal reflexes. No cranial nerve deficit. She exhibits normal muscle tone. Coordination normal.  Skin: No rash noted. No erythema.  Psychiatric: She has a normal mood and affect. Her behavior is normal. Judgment and thought content normal.  L side of the scalp incl L temple - tender; more pain in the occipital area  Lab Results  Component Value Date   WBC 8.3 02/04/2018   HGB 12.6 02/04/2018   HCT 38.5 02/04/2018   PLT 289.0 02/04/2018   GLUCOSE 95 02/04/2018   CHOL 193 02/04/2018   TRIG 252.0 (H) 02/04/2018   HDL 42.50 02/04/2018   LDLDIRECT 110.0 02/04/2018   LDLCALC 102 (H) 01/16/2017   ALT 11 02/04/2018   AST 10 02/04/2018   NA 141 02/04/2018   K 4.1 02/04/2018   CL 107 02/04/2018   CREATININE 0.88 02/04/2018   BUN 13 02/04/2018   CO2 27 02/04/2018   TSH 1.26 02/04/2018   INR 0.99 01/26/2017    Ct Head Wo Contrast  Result Date: 02/20/2018 CLINICAL DATA:  72 year old with headache and blurred vision. EXAM: CT HEAD WITHOUT CONTRAST TECHNIQUE: Contiguous axial images were obtained from the base of the skull through the vertex  without intravenous contrast. COMPARISON:  11/25/2008 FINDINGS: Brain: No evidence for acute hemorrhage, mass lesion, midline shift, hydrocephalus or large infarct. Slightly decreased attenuation in the white matter along the left frontal horn and near the left external capsule. Findings probably represent old ischemic changes and stable. Vascular: No hyperdense vessel or unexpected calcification. Skull: Normal. Negative for fracture or focal lesion. Sinuses/Orbits: Chronic sinus disease in the sphenoid sinus. Other: None. IMPRESSION: No acute intracranial abnormality. Chronic sphenoid sinus disease. Electronically Signed   By: Markus Daft [name]   On: 02/20/2018 08:31    Assessment & Plan:   There are no diagnoses linked to this encounter.   No orders of the defined types were placed in  this encounter.    Follow-up: No follow-ups on file.  Walker Kehr, MD

## 2018-04-18 ENCOUNTER — Encounter: Payer: Self-pay | Admitting: Neurology

## 2018-04-29 ENCOUNTER — Ambulatory Visit: Payer: Medicare Other | Admitting: Internal Medicine

## 2018-04-29 ENCOUNTER — Encounter: Payer: Self-pay | Admitting: Internal Medicine

## 2018-04-29 DIAGNOSIS — H538 Other visual disturbances: Secondary | ICD-10-CM

## 2018-04-29 DIAGNOSIS — F4323 Adjustment disorder with mixed anxiety and depressed mood: Secondary | ICD-10-CM

## 2018-04-29 DIAGNOSIS — G4489 Other headache syndrome: Secondary | ICD-10-CM | POA: Diagnosis not present

## 2018-04-29 MED ORDER — BUTALBITAL-ASA-CAFF-CODEINE 50-325-40-30 MG PO CAPS: 1.0000 | ORAL_CAPSULE | ORAL | 0 refills | Status: DC | PRN

## 2018-04-29 MED ORDER — SUMATRIPTAN SUCCINATE 100 MG PO TABS: 100.0000 mg | ORAL_TABLET | ORAL | 2 refills | Status: DC | PRN

## 2018-04-29 NOTE — Assessment & Plan Note (Signed)
Hold Wellbutrin due to HAs

## 2018-04-29 NOTE — Assessment & Plan Note (Signed)
No relapse 

## 2018-04-29 NOTE — Progress Notes (Signed)
Subjective:  Patient ID: [name], female    DOB: [birth year]  Age: 72 y.o. MRN: [id number]  CC: No chief complaint on file.   HPI KINJAL NEITZKE presents for HAs - had 2 days in 2 weeks, anxiety, depression  Outpatient Medications Prior to Visit  Medication Sig Dispense Refill  . ALPRAZolam (XANAX) 0.5 MG tablet Take 1 tablet (0.5 mg total) by mouth 2 (two) times daily as needed. 60 tablet 3  . aspirin 81 MG tablet Take 81 mg by mouth daily.      Marland Kitchen buPROPion (WELLBUTRIN SR) 150 MG 12 hr tablet TAKE 1 TABLET BY MOUTH TWO  TIMES DAILY 180 tablet 3  . buPROPion (WELLBUTRIN XL) 150 MG 24 hr tablet Take 1 tablet (150 mg total) by mouth daily. 90 tablet 3  . butalbital-aspirin-caffeine-codeine (FIORINAL/CODEINE #3) 50-325-40-30 MG capsule Take 1 capsule by mouth every 4 (four) hours as needed for headache or migraine. 20 capsule 0  . Cholecalciferol (VITAMIN D) 2000 UNITS CAPS Take 1 capsule by mouth daily.      . fish oil-omega-3 fatty acids 1000 MG capsule Take by mouth daily. Take  600 mg daily    . fluticasone (FLONASE) 50 MCG/ACT nasal spray Place 2 sprays into both nostrils daily. 48 g 1  . gabapentin (NEURONTIN) 100 MG capsule TAKE 1 CAPSULE BY MOUTH AT  BEDTIME 90 capsule 3  . LINZESS 290 MCG CAPS capsule TAKE 1 CAPSULE BY MOUTH  DAILY BEFORE BREAKFAST 90 capsule 1  . lovastatin (MEVACOR) 40 MG tablet TAKE 1 TABLET BY MOUTH AT  BEDTIME 90 tablet 1  . Multiple Vitamins-Minerals (OCUVITE PO) Take 2 tablets by mouth daily.    Marland Kitchen omeprazole (PRILOSEC) 40 MG capsule TAKE 1 CAPSULE BY MOUTH  TWICE DAILY BEFORE MEALS 180 capsule 0  . ondansetron (ZOFRAN) 4 MG tablet Take 1 tablet (4 mg total) by mouth every 8 (eight) hours as needed for nausea or vomiting. 12 tablet 0  . polyethylene glycol (MIRALAX) packet Take 17 g by mouth daily. 14 each 0  . Probiotic Product (ALIGN) 4 MG CAPS Take 1 capsule (4 mg total) by mouth daily. 30 capsule 1  . SUMAtriptan (IMITREX) 100 MG tablet Take 1 tablet  (100 mg total) by mouth every 2 (two) hours as needed for migraine. May repeat in 2 hours if headache persists or recurs. 10 tablet 2  . traZODone (DESYREL) 50 MG tablet Take 1 tablet (50 mg total) by mouth at bedtime. 90 tablet 1  . verapamil (CALAN-SR) 240 MG CR tablet TAKE ONE TABLET BY MOUTH AT BEDTIME 90 tablet 1  . VESICARE 10 MG tablet Take 10 mg by mouth daily.     . vitamin B-12 (CYANOCOBALAMIN) 1000 MCG tablet Take 500 mcg by mouth daily.      No facility-administered medications prior to visit.     ROS: Review of Systems  Constitutional: Negative for activity change, appetite change, chills, fatigue and unexpected weight change.  HENT: Negative for congestion, mouth sores and sinus pressure.   Eyes: Negative for visual disturbance.  Respiratory: Negative for cough and chest tightness.   Gastrointestinal: Negative for abdominal pain and nausea.  Genitourinary: Negative for difficulty urinating, frequency and vaginal pain.  Musculoskeletal: Negative for back pain and gait problem.  Skin: Negative for pallor and rash.  Neurological: Negative for dizziness, tremors, weakness, numbness and headaches.  Psychiatric/Behavioral: Negative for confusion and sleep disturbance.    Objective:  There were no vitals taken for  this visit.  BP Readings from Last 3 Encounters:  04/16/18 126/80  03/25/18 124/78  02/26/18 118/70    Wt Readings from Last 3 Encounters:  04/16/18 197 lb (89.4 kg)  03/25/18 198 lb (89.8 kg)  02/26/18 198 lb (89.8 kg)    Physical Exam  Constitutional: She appears well-developed. No distress.  HENT:  Head: Normocephalic.  Right Ear: External ear normal.  Left Ear: External ear normal.  Nose: Nose normal.  Mouth/Throat: Oropharynx is clear and moist.  Eyes: Pupils are equal, round, and reactive to light. Conjunctivae are normal. Right eye exhibits no discharge. Left eye exhibits no discharge.  Neck: Normal range of motion. Neck supple. No JVD present.  No tracheal deviation present. No thyromegaly present.  Cardiovascular: Normal rate, regular rhythm and normal heart sounds.  Pulmonary/Chest: No stridor. No respiratory distress. She has no wheezes.  Abdominal: Soft. Bowel sounds are normal. She exhibits no distension and no mass. There is no tenderness. There is no rebound and no guarding.  Musculoskeletal: She exhibits no edema or tenderness.  Lymphadenopathy:    She has no cervical adenopathy.  Neurological: She displays normal reflexes. No cranial nerve deficit. She exhibits normal muscle tone. Coordination normal.  Skin: No rash noted. No erythema.  Psychiatric: She has a normal mood and affect. Her behavior is normal. Judgment and thought content normal.  head NT  Lab Results  Component Value Date   WBC 7.0 04/16/2018   HGB 12.7 04/16/2018   HCT 38.4 04/16/2018   PLT 271.0 04/16/2018   GLUCOSE 99 04/16/2018   CHOL 193 02/04/2018   TRIG 252.0 (H) 02/04/2018   HDL 42.50 02/04/2018   LDLDIRECT 110.0 02/04/2018   LDLCALC 102 (H) 01/16/2017   ALT 11 02/04/2018   AST 10 02/04/2018   NA 141 04/16/2018   K 4.5 04/16/2018   CL 105 04/16/2018   CREATININE 0.80 04/16/2018   BUN 14 04/16/2018   CO2 29 04/16/2018   TSH 1.26 02/04/2018   INR 0.99 01/26/2017    Ct Head Wo Contrast  Result Date: 02/20/2018 CLINICAL DATA:  72 year old with headache and blurred vision. EXAM: CT HEAD WITHOUT CONTRAST TECHNIQUE: Contiguous axial images were obtained from the base of the skull through the vertex without intravenous contrast. COMPARISON:  11/25/2008 FINDINGS: Brain: No evidence for acute hemorrhage, mass lesion, midline shift, hydrocephalus or large infarct. Slightly decreased attenuation in the white matter along the left frontal horn and near the left external capsule. Findings probably represent old ischemic changes and stable. Vascular: No hyperdense vessel or unexpected calcification. Skull: Normal. Negative for fracture or focal lesion.  Sinuses/Orbits: Chronic sinus disease in the sphenoid sinus. Other: None. IMPRESSION: No acute intracranial abnormality. Chronic sphenoid sinus disease. Electronically Signed   By: Markus Daft [name]   On: 02/20/2018 08:31    Assessment & Plan:   There are no diagnoses linked to this encounter.   No orders of the defined types were placed in this encounter.    Follow-up: No follow-ups on file.  Walker Kehr, MD

## 2018-04-29 NOTE — Assessment & Plan Note (Addendum)
Hold Wellbutrin MRI pending Neurol appt pending Rheum ref w/Dr Estanislado Pandy to r/o temporal arteritis Neurol appt pending Imitrex, Fioricet w/cod prn

## 2018-05-06 ENCOUNTER — Other Ambulatory Visit: Payer: Self-pay | Admitting: Internal Medicine

## 2018-05-06 ENCOUNTER — Ambulatory Visit
Admission: RE | Admit: 2018-05-06 | Discharge: 2018-05-06 | Disposition: A | Discharge status: Home / Self Care | Payer: Medicare Other | Source: Ambulatory Visit | Attending: Internal Medicine | Admitting: Internal Medicine

## 2018-05-06 DIAGNOSIS — G4459 Other complicated headache syndrome: Secondary | ICD-10-CM

## 2018-05-06 DIAGNOSIS — G4489 Other headache syndrome: Secondary | ICD-10-CM

## 2018-05-06 DIAGNOSIS — J323 Chronic sphenoidal sinusitis: Secondary | ICD-10-CM

## 2018-05-06 MED ORDER — AMOXICILLIN-POT CLAVULANATE 875-125 MG PO TABS: 1.0000 | ORAL_TABLET | Freq: Two times a day (BID) | ORAL | 0 refills | Status: DC

## 2018-05-06 MED ORDER — GADOBENATE DIMEGLUMINE 529 MG/ML IV SOLN
18.0000 mL | Freq: Once | INTRAVENOUS | Status: AC | PRN
  Administered 2018-05-06: 18 mL via INTRAVENOUS

## 2018-05-13 NOTE — Progress Notes (Signed)
Office Visit Note  Patient: [name]             Date of Birth: [birth year]           MRN: [id number]             PCP: Cassandria Anger, MD Referring: Cassandria Anger, MD Visit Date: 05/14/2018 Occupation: @GUAROCC @  Subjective:  Headache   History of Present Illness: [name] is a 72 y.o. female with history of fibromyalgia and osteoarthritis. She takes trazodone 50 mg 1 tablet by mouth at bedtime for insomnia.  Patient reports that over the past 3 months she has been having more frequent migraines.  She states that she has been evaluated by her primary care who gave her prescription for sumatriptan as well as Zofran.  She was advised to also hold her Wellbutrin due to the frequency of headaches that she has been experiencing.  She denies any vision changes or blurry vision.  She states that she does develop sensitivity light when she is experiencing migraines.  She denies a history of temporal arteritis or PMR.  She denies any triggers for the headaches.  She reports that it is typically left-sided headache.  She reports that she has a neurology appointment coming up on 05/20/18 for evaluation. Patient pressure continues have generalized muscle aches muscle tenderness due to fibromyalgia.  She continues to have bursitis bilaterally.  She continues to take trazodone 50 mg 1 tablet by mouth at bedtime to help with insomnia.  She has chronic fatigue. she states occasionally her right knee replacement will cause discomfort but she denies any joint swelling at this time.    Activities of Daily Living:  Patient reports morning stiffness for 30  minutes.   Patient Reports nocturnal pain.  Difficulty dressing/grooming: Denies Difficulty climbing stairs: Reports Difficulty getting out of chair: Reports Difficulty using hands for taps, buttons, cutlery, and/or writing: Denies  Review of Systems  Constitutional: Positive for fatigue.  HENT: Positive for mouth dryness. Negative for  mouth sores and nose dryness.   Eyes: Positive for dryness. Negative for pain and visual disturbance.  Respiratory: Negative for cough, hemoptysis, shortness of breath and difficulty breathing.   Cardiovascular: Positive for palpitations. Negative for chest pain, hypertension and swelling in legs/feet.  Gastrointestinal: Positive for constipation. Negative for blood in stool and diarrhea.  Endocrine: Negative for increased urination.  Genitourinary: Negative for painful urination.  Musculoskeletal: Positive for myalgias, morning stiffness, muscle tenderness and myalgias. Negative for arthralgias, joint pain, joint swelling and muscle weakness.  Skin: Negative for color change, pallor, rash, hair loss, nodules/bumps, skin tightness, ulcers and sensitivity to sunlight.  Allergic/Immunologic: Negative for susceptible to infections.  Neurological: Positive for headaches (Hx of migraines). Negative for dizziness, numbness and weakness.  Hematological: Negative for swollen glands.  Psychiatric/Behavioral: Positive for sleep disturbance (On Trazodone). Negative for depressed mood. The patient is nervous/anxious.     PMFS History:  Patient Active Problem List   Diagnosis Date Noted  . Blurred vision, bilateral 02/19/2018  . Constipation 03/22/2017  . Ileus ([place])   . Dehydration   . SBO (small bowel obstruction) ([place]) 02/07/2017  . 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  . Abdominal pain, LLQ 01/26/2017  . Intussusception ([place]) 01/26/2017  . Hearing loss 07/13/2016  . Influenza 09/16/2015  . Headache 12/24/2014  . Neoplasm of uncertain behavior of skin 12/24/2014  . 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 Dysuria 04/07/2014  . Cough 08/25/2013  . Well adult exam 06/16/2013  .  Sinusitis, chronic 06/09/2013  . URI, acute 06/09/2012  . Restless legs syndrome (RLS) 01/26/2011  . FINGER PAIN 07/28/2010  . PALPITATIONS 10/18/2008  . Vitamin D deficiency 10/09/2008  . DYSLIPIDEMIA 10/09/2008  .  Morbid obesity ([place]) 10/09/2008  . Essential hypertension 10/09/2008  . SYNCOPE 09/27/2008  . UNSPECIFIED EUSTACHIAN TUBE DISORDER 04/09/2008  . Anxiety state 10/07/2007  . INSOMNIA, PERSISTENT 07/08/2007  . Adjustment disorder with mixed anxiety and depressed mood 07/08/2007  . RHINITIS 07/08/2007  . B12 deficiency 04/07/2007  . ANEMIA, DUE TO BLOOD LOSS 04/07/2007  . OSTEOARTHRITIS 04/07/2007  . GERD 04/04/2007  . Diverticulosis of colon 04/04/2007    Past Medical History:  Diagnosis Date  . Anxiety   . B12 DEFICIENCY   . Cholecystitis, unspecified   . DEPRESSION   . Diastolic dysfunction   . DIVERTICULOSIS, COLON   . DYSLIPIDEMIA   . Dysrhythmia    PVC's  . Fibromyalgia   . GERD   . GERD (gastroesophageal reflux disease)   . Hyperplastic colon polyp   . HYPERTENSION   . INSOMNIA, PERSISTENT   . Morbid obesity ([place])   . OSTEOARTHRITIS   . PONV (postoperative nausea and vomiting)   . Skin cancer 2018   upper lip   . SYNCOPE   . Ulcer of esophagus   . VITAMIN D DEFICIENCY     Family History  Problem Relation Age of Onset  . Emphysema Father   . Heart disease Father   . Arthritis Mother   . Hypertension Mother        pulmonary  . Heart disease Mother   . Colon cancer Maternal Aunt   . Diabetes Sister   . Lung cancer Sister   . Colon cancer Maternal Uncle   . Colon cancer Other        nephew  . Diabetes Maternal Aunt        x 4  . Diabetes Maternal Uncle        x 3  . Diabetes Paternal Uncle         1  . Kidney disease Other        niece  . Heart Problems Daughter   . Diabetes Daughter   . Breast cancer Paternal Aunt 49  . Esophageal cancer Neg Hx   . Liver disease Neg Hx    Past Surgical History:  Procedure Laterality Date  . ABDOMINAL HYSTERECTOMY    . APPENDECTOMY    . BREAST BIOPSY Left 2010   Stero biopsy  . BREAST EXCISIONAL BIOPSY Left 1985   Benign   . CESAREAN SECTION    . CHOLECYSTECTOMY    . GASTROPLASTY    . gatric stapling      . LAPAROSCOPY N/A 01/30/2017   Procedure: LAPAROSCOPY DIAGNOSTIC WITH LAPAROSCOPIC LYSIS OF ADHESIONS;  Surgeon: Excell Seltzer, MD;  Location: [facility];  Service: General;  Laterality: N/A;  . MIDDLE EAR SURGERY Right   . revearsal of gastric stapling    . TONSILLECTOMY    . TOTAL KNEE ARTHROPLASTY     rt.  . TUBAL LIGATION    . TYMPANOPLASTY Right 10/22/2016   Procedure: RIGHT TYMPANOPLASTY;  Surgeon: Izora Gala, MD;  Location: [facility];  Service: ENT;  Laterality: Right;  . TYMPANOPLASTY Right 09/02/2017   Procedure: REVISION RIGHT TYMPANOPLASTY;  Surgeon: Izora Gala, MD;  Location: [facility];  Service: ENT;  Laterality: Right;   Social History   Social History Narrative  . Not on  file    Objective: Vital Signs: BP 131/85 (BP Location: Left Arm, Patient Position: Sitting, Cuff Size: Normal)   Pulse 78   Resp 13   Ht 5' 0.5" (1.537 m)   Wt 203 lb (92.1 kg)   BMI 38.99 kg/m    Physical Exam  Constitutional: She is oriented to person, place, and time. She appears well-developed and well-nourished.  HENT:  Head: Normocephalic and atraumatic.  No localized temporal artery tenderness on exam.  Eyes: Conjunctivae and EOM are normal.  Neck: Normal range of motion.  Cardiovascular: Normal rate, regular rhythm, normal heart sounds and intact distal pulses.  Pulmonary/Chest: Effort normal and breath sounds normal.  Abdominal: Soft. Bowel sounds are normal.  Lymphadenopathy:    She has no cervical adenopathy.  Neurological: She is alert and oriented to person, place, and time.  Skin: Skin is warm and dry. Capillary refill takes less than 2 seconds.  Psychiatric: She has a normal mood and affect. Her behavior is normal.  Nursing note and vitals reviewed.    Musculoskeletal Exam: Positive fibromyalgia tender points. C-spine, thoracic spine, lumbar spine good range of motion.  No midline spinal tenderness.  No SI joint tenderness.  Shoulder  joints, but joints, wrist joints, MCPs and PIPs, DIPs good range of motion no synovitis.  She is complete fist formation bilaterally.  Hip joints, knee joints, ankle joints, MTPs and PIPs and DIPs good range of motion no synovitis. Mild warmth of bilateral knee replacements.  No tenderness or swelling of ankle joints.  Tenderness of bilateral trochanteric bursa.   CDAI Exam: CDAI Score: Not documented Patient Global Assessment: Not documented; Provider Global Assessment: Not documented Swollen: Not documented; Tender: Not documented Joint Exam   Not documented   There is currently no information documented on the homunculus. Go to the Rheumatology activity and complete the homunculus joint exam.  Investigation: No additional findings.  Imaging: Mr Jeri Cos WU Contrast  Result Date: 05/06/2018 CLINICAL DATA:  Left-sided headache over the last 3 months. EXAM: MRI HEAD WITHOUT AND WITH CONTRAST TECHNIQUE: Multiplanar, multiecho pulse sequences of the brain and surrounding structures were obtained without and with intravenous contrast. CONTRAST:  36mL MULTIHANCE GADOBENATE DIMEGLUMINE 529 MG/ML IV SOLN COMPARISON:  CT 02/19/2018 FINDINGS: Brain: Diffusion imaging does not show any acute or subacute infarction. 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 No focal cerebellar finding. Cerebral hemispheres show chronic small-vessel ischemic changes of the thalami, basal ganglia and hemispheric white matter of a mild-to-moderate degree. No cortical or large vessel territory infarction. No mass lesion, hemorrhage, hydrocephalus or extra-axial collection. After contrast administration, no abnormal enhancement occurs. Vascular: Major vessels at the base of the brain show flow. Skull and upper cervical spine: Negative Sinuses/Orbits: Inflammatory changes of the sphenoid sinus which could be associated with headache. Other sinuses are clear. Orbits negative. Other: None IMPRESSION: Sphenoid  sinus inflammatory change which could be associated with headache. Mild chronic appearing small vessel ischemic changes elsewhere throughout the brain. Electronically Signed   By: Nelson Chimes [name]   On: 05/06/2018 09:45    Recent Labs: Lab Results  Component Value Date   WBC 7.0 04/16/2018   HGB 12.7 04/16/2018   PLT 271.0 04/16/2018   NA 141 04/16/2018   K 4.5 04/16/2018   CL 105 04/16/2018   CO2 29 04/16/2018   GLUCOSE 99 04/16/2018   BUN 14 04/16/2018   CREATININE 0.80 04/16/2018   BILITOT 0.7 02/04/2018   ALKPHOS 95  02/04/2018   AST 10 02/04/2018   ALT 11 02/04/2018   PROT 7.0 02/04/2018   ALBUMIN 4.0 02/04/2018   CALCIUM 9.2 04/16/2018   GFRAA >60 02/16/2017    Speciality Comments: No specialty comments available.  Procedures:  No procedures performed Allergies: Levaquin [levofloxacin]   Assessment / Plan:     Visit Diagnoses: Fibromyalgia: She continues to have generalized muscle aches and muscle tenderness due to fibromyalgia. She has positive tender points on exam.  She has positive tender points on exam.  She has chronic fatigue and insomnia.  She takes trazodone 50 mg 1 tablet by mouth at bedtime for insomnia.  She was encouraged to continue to stay active and exercise on a regular basis.    Primary osteoarthritis of both hands: She has PIP and DIP synovial thickening consistent with osteoarthritis. Complete fist formation bilaterally. No synovitis. Joint protection and muscle strengthening were discussed.   History of total knee replacement, bilateral: Good ROM with no discomfort.  Mild warmth but no effusion. She has occasional right knee discomfort.   Primary insomnia -She takes Trazodone 50 mg 1 tablet by mouth at bedtime. Good sleep hygiene discussed.   History of migraine: She has been having an increased frequency of migraines over the past 3 months.  She experiences nausea and photophobia with migraines.  She was evaluated by her PCP.  She was advised to  hold wellbutrin at this time.  A MRI of the head was ordered on 05/06/18 and revealed sphenoid sinus inflammatory change which could be associated with headache.  She is seeing an ENT specialist. A referral was placed to neurology for further evaluation on 05/20/18.  She was prescribed Imitrex and Fioricet.  She takes Zofran PRN for nausea.  Her migraines are typically left sided.  She denies any blurry vision or changes in vision. She has been evaluated by her ophthalmologist.  A sed rate was 14 on 02/19/18 and was 21 on 04/16/18.  Her PCP wanted her to be evaluated to rule out temporal arteritis.  She has no localized tenderness of the left temporal artery.  She has frontal sinus tenderness and diffuse left sided scalp tenderness. Sed rate is still considered WNL so the diagnosis is very unlikely.    Other medical conditions are listed as follows:   Diverticulosis of colon  History of gastroesophageal reflux (GERD)  Restless legs syndrome (RLS)  History of hypercholesterolemia  History of depression   Orders: No orders of the defined types were placed in this encounter.  No orders of the defined types were placed in this encounter.    Follow-Up Instructions: Return for Fibromyalgia, Osteoarthritis.   Ofilia Neas, PA-C   I examined and evaluated the patient with Hazel Sams PA.  Patient had tenderness on my examination over her left frontal, left parietal, left temporal and left occipital region.  She also has some maxillary sinus tenderness.  Her sed rate is normal.  This is not characteristic of temporal arteritis.  She has appointment coming up with the  neurologist.  The plan of care was discussed as noted above.  Bo Merino, MD  Note - This record has been created using Editor, commissioning.  Chart creation errors have been sought, but may not always  have been located. Such creation errors do not reflect on  the standard of medical care.

## 2018-05-14 ENCOUNTER — Encounter: Payer: Self-pay | Admitting: Rheumatology

## 2018-05-14 ENCOUNTER — Ambulatory Visit: Payer: Medicare Other | Admitting: Rheumatology

## 2018-05-14 VITALS — BP 131/85 | HR 78 | Resp 13 | Ht 60.5 in | Wt 203.0 lb

## 2018-05-14 DIAGNOSIS — K5730 Diverticulosis of large intestine without perforation or abscess without bleeding: Secondary | ICD-10-CM

## 2018-05-14 DIAGNOSIS — M19042 Primary osteoarthritis, left hand: Secondary | ICD-10-CM

## 2018-05-14 DIAGNOSIS — Z8669 Personal history of other diseases of the nervous system and sense organs: Secondary | ICD-10-CM

## 2018-05-14 DIAGNOSIS — F5101 Primary insomnia: Secondary | ICD-10-CM | POA: Diagnosis not present

## 2018-05-14 DIAGNOSIS — Z96653 Presence of artificial knee joint, bilateral: Secondary | ICD-10-CM

## 2018-05-14 DIAGNOSIS — M797 Fibromyalgia: Secondary | ICD-10-CM | POA: Diagnosis not present

## 2018-05-14 DIAGNOSIS — Z8639 Personal history of other endocrine, nutritional and metabolic disease: Secondary | ICD-10-CM

## 2018-05-14 DIAGNOSIS — M19041 Primary osteoarthritis, right hand: Secondary | ICD-10-CM

## 2018-05-14 DIAGNOSIS — Z8659 Personal history of other mental and behavioral disorders: Secondary | ICD-10-CM

## 2018-05-14 DIAGNOSIS — G2581 Restless legs syndrome: Secondary | ICD-10-CM

## 2018-05-14 DIAGNOSIS — Z8719 Personal history of other diseases of the digestive system: Secondary | ICD-10-CM

## 2018-05-19 ENCOUNTER — Other Ambulatory Visit: Payer: Self-pay | Admitting: Internal Medicine

## 2018-05-19 NOTE — Progress Notes (Signed)
NEUROLOGY CONSULTATION NOTE  [name] MRN: [id number] DOB: [birth year]  Referring provider: Lew Dawes, MD Primary care provider: Lew Dawes, MD  Reason for consult:  headache  HISTORY OF PRESENT ILLNESS: [name] is a 72 year old female with hypertension, osteoarthritis, anxiety, depression, and fibromyalgia who presents for headaches.  History supplemented by referring providers notes.  Onset:  About 3 months ago. Location:  Start in back of the left side behind the ear, sometimes behind left eye and may spread across forehead. Quality:  sharp Intensity:  Moderate to severe.  She denies thunderclap headache. Aura:  no Prodrome:  no Postdrome:  no Associated symptoms:  Nausea, phonophobia, osmophobia.  She denies associated photophobia, visual disturbance, and unilateral numbness or weakness. Duration:  2 to 3 days Frequency:  At least once a week Frequency of abortive medication: averages 1 to 2 days a week. Triggers:  no Relieving factors:  no Activity:  aggravates  Rescue protocol:  First line sumatriptan 100mg , repeat dose, then Fiorinal.   Zofran for nausea. Current NSAIDS: Aspirin 81 mg Current analgesics: Fiorinal with codeine Current triptans: Sumatriptan 100 mg Current ergotamine:  no Current anti-emetic:  Zofran 4mg  Current muscle relaxants:  no Current anti-anxiolytic: Alprazolam Current sleep aide:  no Current Antihypertensive medications: Verapamil CR 240 mg Current Antidepressant medications: Wellbutrin Current Anticonvulsant medications: Gabapentin 100 mg at bedtime (for restless leg syndrome) Current anti-CGRP:  no Current Vitamins/Herbal/Supplements:  no Current Antihistamines/Decongestants: Flonase Other therapy:  no Other medication:  no  Eye exam was fine.  Sed rate from 04/16/2018 was 21.  MRI of the brain with and without contrast from 05/06/2018 was personally reviewed and demonstrated inflammatory changes of the sphenoid  sinus but no acute intracranial abnormalities.  Past NSAIDS:  Ibuprofen, naproxen Past analgesics: Tylenol, Excedrin Past abortive triptans:  None Past abortive ergotamine:  None Past muscle relaxants:  None Past anti-emetic:  None Past antihypertensive medications:  None Past antidepressant medications:  none Past anticonvulsant medications:  none Past anti-CGRP:  none Past vitamins/Herbal/Supplements:  none Past antihistamines/decongestants:  none Other past therapies:  none  Caffeine:  Diet cola once or twice a day.  Coffee infrequently.   Diet:  Does not hydrate.  Drinks two diet drinks daily.  Does not skip meals. Exercise:  No  Depression:  From time to time; Anxiety:  yes Other pain:  Fibromyalgia, feels stiff in the mornings Sleep hygiene:  Up until 2-3 AM and will then sometimes fall asleep She reports remote history of migraines in young adulthood. Family history of headache:  No.  An aunt and grandmother both had an aneurysm.  PAST MEDICAL HISTORY: Past Medical History:  Diagnosis Date  . Anxiety   . B12 DEFICIENCY   . Cholecystitis, unspecified   . DEPRESSION   . Diastolic dysfunction   . DIVERTICULOSIS, COLON   . DYSLIPIDEMIA   . Dysrhythmia    PVC's  . Fibromyalgia   . GERD   . GERD (gastroesophageal reflux disease)   . Hyperplastic colon polyp   . HYPERTENSION   . INSOMNIA, PERSISTENT   . Morbid obesity ([place])   . OSTEOARTHRITIS   . PONV (postoperative nausea and vomiting)   . Skin cancer 2018   upper lip   . SYNCOPE   . Ulcer of esophagus   . VITAMIN D DEFICIENCY     PAST SURGICAL HISTORY: Past Surgical History:  Procedure Laterality Date  . ABDOMINAL HYSTERECTOMY    . APPENDECTOMY    . BREAST BIOPSY Left  2010   Stero biopsy  . BREAST EXCISIONAL BIOPSY Left 1985   Benign   . CESAREAN SECTION    . CHOLECYSTECTOMY    . GASTROPLASTY    . gatric stapling    . LAPAROSCOPY N/A 01/30/2017   Procedure: LAPAROSCOPY DIAGNOSTIC WITH LAPAROSCOPIC  LYSIS OF ADHESIONS;  Surgeon: Excell Seltzer, MD;  Location: [facility];  Service: General;  Laterality: N/A;  . MIDDLE EAR SURGERY Right   . revearsal of gastric stapling    . TONSILLECTOMY    . TOTAL KNEE ARTHROPLASTY     rt.  . TUBAL LIGATION    . TYMPANOPLASTY Right 10/22/2016   Procedure: RIGHT TYMPANOPLASTY;  Surgeon: Izora Gala, MD;  Location: [facility];  Service: ENT;  Laterality: Right;  . TYMPANOPLASTY Right 09/02/2017   Procedure: REVISION RIGHT TYMPANOPLASTY;  Surgeon: Izora Gala, MD;  Location: [facility];  Service: ENT;  Laterality: Right;    MEDICATIONS: Current Outpatient Medications on File Prior to Visit  Medication Sig Dispense Refill  . ALPRAZolam (XANAX) 0.5 MG tablet Take 1 tablet (0.5 mg total) by mouth 2 (two) times daily as needed. 60 tablet 3  . amoxicillin-clavulanate (AUGMENTIN) 875-125 MG tablet Take 1 tablet by mouth 2 (two) times daily. 42 tablet 0  . aspirin 81 MG tablet Take 81 mg by mouth daily.      Marland Kitchen buPROPion (WELLBUTRIN XL) 150 MG 24 hr tablet Take 1 tablet (150 mg total) by mouth daily. 90 tablet 3  . butalbital-aspirin-caffeine-codeine (FIORINAL/CODEINE #3) 50-325-40-30 MG capsule Take 1-2 capsules by mouth every 4 (four) hours as needed for headache or migraine. 20 capsule 0  . Cholecalciferol (VITAMIN D) 2000 UNITS CAPS Take 1 capsule by mouth daily.      . fish oil-omega-3 fatty acids 1000 MG capsule Take by mouth daily. Take  600 mg daily    . fluticasone (FLONASE) 50 MCG/ACT nasal spray Place 2 sprays into both nostrils daily. 48 g 1  . gabapentin (NEURONTIN) 100 MG capsule TAKE 1 CAPSULE BY MOUTH AT  BEDTIME 90 capsule 3  . LINZESS 290 MCG CAPS capsule TAKE 1 CAPSULE BY MOUTH  DAILY BEFORE BREAKFAST 90 capsule 1  . lovastatin (MEVACOR) 40 MG tablet TAKE 1 TABLET BY MOUTH AT  BEDTIME 90 tablet 1  . Multiple Vitamins-Minerals (OCUVITE PO) Take 2 tablets by mouth daily.    Marland Kitchen omeprazole (PRILOSEC) 40 MG capsule TAKE  1 CAPSULE BY MOUTH  TWICE DAILY BEFORE MEALS 180 capsule 0  . ondansetron (ZOFRAN) 4 MG tablet Take 1 tablet (4 mg total) by mouth every 8 (eight) hours as needed for nausea or vomiting. 12 tablet 0  . polyethylene glycol (MIRALAX) packet Take 17 g by mouth daily. (Patient taking differently: Take 17 g by mouth as needed. ) 14 each 0  . Probiotic Product (ALIGN) 4 MG CAPS Take 1 capsule (4 mg total) by mouth daily. 30 capsule 1  . SUMAtriptan (IMITREX) 100 MG tablet Take 1 tablet (100 mg total) by mouth every 2 (two) hours as needed for migraine. May repeat in 2 hours if headache persists or recurs. 10 tablet 2  . traZODone (DESYREL) 50 MG tablet Take 1 tablet (50 mg total) by mouth at bedtime. (Patient taking differently: Take 50 mg by mouth as needed. ) 90 tablet 1  . verapamil (CALAN-SR) 240 MG CR tablet TAKE ONE TABLET BY MOUTH AT BEDTIME 90 tablet 1  . VESICARE 10 MG tablet Take 10 mg by mouth  daily.     . vitamin B-12 (CYANOCOBALAMIN) 1000 MCG tablet Take 500 mcg by mouth daily.      No current facility-administered medications on file prior to visit.     ALLERGIES: Allergies  Allergen Reactions  . Levaquin [Levofloxacin]     nausea    FAMILY HISTORY: Family History  Problem Relation Age of Onset  . Emphysema Father   . Heart disease Father   . Arthritis Mother   . Hypertension Mother        pulmonary  . Heart disease Mother   . Colon cancer Maternal Aunt   . Diabetes Sister   . Lung cancer Sister   . Colon cancer Maternal Uncle   . Colon cancer Other        nephew  . Diabetes Maternal Aunt        x 4  . Diabetes Maternal Uncle        x 3  . Diabetes Paternal Uncle         1  . Kidney disease Other        niece  . Heart Problems Daughter   . Diabetes Daughter   . Breast cancer Paternal Aunt 68  . Esophageal cancer Neg Hx   . Liver disease Neg Hx     SOCIAL HISTORY: Social History   Socioeconomic History  . Marital status: Married    Spouse name: Not on  file  . Number of children: 2  . Years of education: Not on file  . Highest education level: Not on file  Occupational History  . Occupation: office work  Scientific laboratory technician  . Financial resource strain: Not on file  . Food insecurity:    Worry: Not on file    Inability: Not on file  . Transportation needs:    Medical: Not on file    Non-medical: Not on file  Tobacco Use  . Smoking status: Never Smoker  . Smokeless tobacco: Never Used  Substance and Sexual Activity  . Alcohol use: No  . Drug use: No  . Sexual activity: Not on file  Lifestyle  . Physical activity:    Days per week: Not on file    Minutes per session: Not on file  . Stress: Not on file  Relationships  . Social connections:    Talks on phone: Not on file    Gets together: Not on file    Attends religious service: Not on file    Active member of club or organization: Not on file    Attends meetings of clubs or organizations: Not on file    Relationship status: Not on file  . Intimate partner violence:    Fear of current or ex partner: Not on file    Emotionally abused: Not on file    Physically abused: Not on file    Forced sexual activity: Not on file  Other Topics Concern  . Not on file  Social History Narrative  . Not on file    REVIEW OF SYSTEMS: Constitutional: No fevers, chills, or sweats, no generalized fatigue, change in appetite Eyes: No visual changes, double vision, eye pain Ear, nose and throat: No hearing loss, ear pain, nasal congestion, sore throat Cardiovascular: No chest pain, palpitations Respiratory:  No shortness of breath at rest or with exertion, wheezes GastrointestinaI: No nausea, vomiting, diarrhea, abdominal pain, fecal incontinence Genitourinary:  No dysuria, urinary retention or frequency Musculoskeletal:  No neck pain, back pain Integumentary: No rash, pruritus, skin  lesions Neurological: as above Psychiatric: No depression, insomnia, anxiety Endocrine: No palpitations,  fatigue, diaphoresis, mood swings, change in appetite, change in weight, increased thirst Hematologic/Lymphatic:  No purpura, petechiae. Allergic/Immunologic: no itchy/runny eyes, nasal congestion, recent allergic reactions, rashes  PHYSICAL EXAM: Blood pressure 140/76, pulse 76, height 5\' 1"  (1.549 m), weight 200 lb (90.7 kg), SpO2 97 %. General: No acute distress.  Patient appears well-groomed.  Head:  Normocephalic/atraumatic Eyes:  fundi examined but not visualized Neck: supple, no paraspinal tenderness, full range of motion Back: No paraspinal tenderness Heart: regular rate and rhythm Lungs: Clear to auscultation bilaterally. Vascular: No carotid bruits. Neurological Exam: Mental status: alert and oriented to person, place, and time, recent and remote memory intact, fund of knowledge intact, attention and concentration intact, speech fluent and not dysarthric, language intact. Cranial nerves: CN I: not tested CN II: pupils equal, round and reactive to light, visual fields intact CN III, IV, VI:  full range of motion, no nystagmus, no ptosis CN V: facial sensation intact CN VII: upper and lower face symmetric CN VIII: hearing intact CN IX, X: gag intact, uvula midline CN XI: sternocleidomastoid and trapezius muscles intact CN XII: tongue midline Bulk & Tone: normal, no fasciculations. Motor:  5/5 throughout  Sensation: temperature and vibration sensation intact. Deep Tendon Reflexes:  2+ throughout, toes downgoing.  Finger to nose testing:  Without dysmetria.  Heel to shin:  Without dysmetria.  Gait:  Normal station and stride.  Able to turn and tandem walk. Romberg negative.  IMPRESSION: Migraine without aura, without status migrainosus, intractable  PLAN: 1.  For preventative management, start nortriptyline 10mg  at bedtime.  We can increase dose to 25mg  at bedtime in 4 weeks if needed. 2.  Stop sumatriptan and Fiorinal.  For abortive therapy, she will try flurbiprofen  100mg  3.  Limit use of pain relievers to no more than 2 days out of week to prevent risk of rebound or medication-overuse headache. 4.  Keep headache diary 5.  Exercise, hydration, caffeine cessation, sleep hygiene, monitor for and avoid triggers 6.  Consider:  magnesium citrate 400mg  daily, riboflavin 400mg  daily, and coenzyme Q10 100mg  three times daily 7.  Follow up in 3 to 4 months.  Thank you for allowing me to take part in the care of this patient.  Metta Clines, DO  CC:  Lew Dawes, MD

## 2018-05-20 ENCOUNTER — Encounter: Payer: Self-pay | Admitting: Neurology

## 2018-05-20 ENCOUNTER — Ambulatory Visit: Payer: Medicare Other | Admitting: Neurology

## 2018-05-20 VITALS — BP 140/76 | HR 76 | Ht 61.0 in | Wt 200.0 lb

## 2018-05-20 DIAGNOSIS — G43019 Migraine without aura, intractable, without status migrainosus: Secondary | ICD-10-CM

## 2018-05-20 MED ORDER — NORTRIPTYLINE HCL 10 MG PO CAPS: 10.0000 mg | ORAL_CAPSULE | Freq: Every day | ORAL | 3 refills | Status: DC

## 2018-05-20 MED ORDER — FLURBIPROFEN 100 MG PO TABS: ORAL_TABLET | ORAL | 3 refills | Status: DC

## 2018-05-20 NOTE — Patient Instructions (Signed)
Migraine Recommendations: 1.  Start nortriptyline 10mg  at bedtime.  Contact me in 4 weeks with update and we can adjust dose if needed. 2.  STOP SUMATRIPTAN AND FIORINAL.  At earliest onset of headache, take flurbiprofen 100mg .  May take every 8 hours as needed.  Maximum of 300mg  in 24 hours. 3.  Limit use of pain relievers to no more than 2 days out of the week.  These medications include acetaminophen, ibuprofen, triptans and narcotics.  This will help reduce risk of rebound headaches. 4.  Be aware of common food triggers such as processed sweets, processed foods with nitrites (such as deli meat, hot dogs, sausages), foods with MSG, alcohol (such as wine), chocolate, certain cheeses, certain fruits (dried fruits, bananas, pineapple), vinegar, diet soda. 4.  Avoid caffeine 5.  Routine exercise 6.  Proper sleep hygiene 7.  Stay adequately hydrated with water 8.  Keep a headache diary. 9.  Maintain proper stress management. 10.  Do not skip meals. 11.  Consider supplements:  Magnesium citrate 400mg  to 600mg  daily, riboflavin 400mg , Coenzyme Q 10 100mg  three times daily 12.  Follow up in 3 to 4 months.

## 2018-05-21 DIAGNOSIS — G43019 Migraine without aura, intractable, without status migrainosus: Secondary | ICD-10-CM

## 2018-05-21 NOTE — Progress Notes (Signed)
PA initiated via [email or web address] for pt's  Nortriptyline HCl 10MG  capsules

## 2018-05-22 NOTE — Progress Notes (Signed)
Received notice from [email or web address] that pt's Nortriptyline has been denied with the following message:  Nortriptyline is not FDA approved for your medical condition(s): Headache. These condition(s) are not supported by one of the accepted references. Therefore your drug is denied because it is not being used for a "medically accepted indication."

## 2018-05-22 NOTE — Progress Notes (Signed)
Forwarding to [place], CMA as [employer]

## 2018-05-23 MED ORDER — AMITRIPTYLINE HCL 10 MG PO TABS: 10.0000 mg | ORAL_TABLET | Freq: Every day | ORAL | 3 refills | Status: DC

## 2018-05-23 NOTE — Addendum Note (Signed)
Addended by: Clois Comber on: 05/23/2018 03:04 PM   Modules accepted: Orders

## 2018-05-23 NOTE — Progress Notes (Signed)
Will they approve amitriptyline 10mg  at bedtime?

## 2018-05-23 NOTE — Progress Notes (Signed)
Am sending in Rx to see if will be covered

## 2018-05-30 DIAGNOSIS — G44049 Chronic paroxysmal hemicrania, not intractable: Secondary | ICD-10-CM | POA: Insufficient documentation

## 2018-06-10 ENCOUNTER — Other Ambulatory Visit: Payer: Self-pay | Admitting: Internal Medicine

## 2018-06-10 ENCOUNTER — Ambulatory Visit: Payer: Self-pay | Admitting: Internal Medicine

## 2018-06-12 ENCOUNTER — Other Ambulatory Visit: Payer: Self-pay | Admitting: Neurology

## 2018-06-17 ENCOUNTER — Ambulatory Visit: Payer: Medicare Other | Admitting: Internal Medicine

## 2018-06-26 ENCOUNTER — Other Ambulatory Visit: Payer: Self-pay | Admitting: Internal Medicine

## 2018-07-08 ENCOUNTER — Encounter: Payer: Self-pay | Admitting: Internal Medicine

## 2018-07-08 ENCOUNTER — Ambulatory Visit: Payer: Medicare Other | Admitting: Internal Medicine

## 2018-07-08 DIAGNOSIS — E538 Deficiency of other specified B group vitamins: Secondary | ICD-10-CM | POA: Diagnosis not present

## 2018-07-08 DIAGNOSIS — J323 Chronic sphenoidal sinusitis: Secondary | ICD-10-CM

## 2018-07-08 DIAGNOSIS — I10 Essential (primary) hypertension: Secondary | ICD-10-CM | POA: Diagnosis not present

## 2018-07-08 MED ORDER — SUMATRIPTAN SUCCINATE 100 MG PO TABS: 100.0000 mg | ORAL_TABLET | ORAL | 5 refills | Status: DC | PRN

## 2018-07-08 MED ORDER — ONDANSETRON HCL 4 MG PO TABS: 4.0000 mg | ORAL_TABLET | Freq: Three times a day (TID) | ORAL | 1 refills | Status: DC | PRN

## 2018-07-08 NOTE — Assessment & Plan Note (Signed)
cardiac CT scan for calcium scoring offered 

## 2018-07-08 NOTE — Progress Notes (Signed)
Subjective:  Patient ID: [name], female    DOB: 04-15-1946  Age: 73 y.o. MRN: [id number]  CC: No chief complaint on file.   HPI [name] presents for HAs, sphenoid sinusitis - Dr Constance Holster, anxiety f/u.  Outpatient Medications Prior to Visit  Medication Sig Dispense Refill  . ALPRAZolam (XANAX) 0.5 MG tablet Take 1 tablet (0.5 mg total) by mouth 2 (two) times daily as needed. 60 tablet 3  . amitriptyline (ELAVIL) 10 MG tablet Take 1 tablet (10 mg total) by mouth at bedtime. 30 tablet 3  . amoxicillin-clavulanate (AUGMENTIN) 875-125 MG tablet Take 1 tablet by mouth 2 (two) times daily. 42 tablet 0  . aspirin 81 MG tablet Take 81 mg by mouth daily.      Marland Kitchen buPROPion (WELLBUTRIN XL) 150 MG 24 hr tablet Take 1 tablet (150 mg total) by mouth daily. 90 tablet 3  . Cholecalciferol (VITAMIN D) 2000 UNITS CAPS Take 1 capsule by mouth daily.      . fish oil-omega-3 fatty acids 1000 MG capsule Take by mouth daily. Take  600 mg daily    . flurbiprofen (ANSAID) 100 MG tablet Take 1 tablet earliest onset of migraine. May take every 8 hours as needed, maximum 300mg  in 24h 30 tablet 3  . fluticasone (FLONASE) 50 MCG/ACT nasal spray Place 2 sprays into both nostrils daily. 48 g 1  . gabapentin (NEURONTIN) 100 MG capsule TAKE 1 CAPSULE BY MOUTH AT  BEDTIME 90 capsule 3  . LINZESS 290 MCG CAPS capsule TAKE 1 CAPSULE BY MOUTH  DAILY BEFORE BREAKFAST 90 capsule 1  . lovastatin (MEVACOR) 40 MG tablet TAKE 1 TABLET BY MOUTH AT  BEDTIME 90 tablet 1  . Multiple Vitamins-Minerals (OCUVITE PO) Take 2 tablets by mouth daily.    . nortriptyline (PAMELOR) 10 MG capsule TAKE 1 CAPSULE BY MOUTH AT BEDTIME 90 capsule 2  . omeprazole (PRILOSEC) 40 MG capsule TAKE 1 CAPSULE BY MOUTH  TWICE DAILY BEFORE MEALS 180 capsule 0  . ondansetron (ZOFRAN) 4 MG tablet Take 1 tablet (4 mg total) by mouth every 8 (eight) hours as needed for nausea or vomiting. 12 tablet 0  . polyethylene glycol (MIRALAX) packet Take 17 g by  mouth daily. (Patient taking differently: Take 17 g by mouth as needed. ) 14 each 0  . Probiotic Product (ALIGN) 4 MG CAPS Take 1 capsule (4 mg total) by mouth daily. 30 capsule 1  . verapamil (CALAN-SR) 240 MG CR tablet TAKE ONE TABLET BY MOUTH AT BEDTIME 90 tablet 1  . VESICARE 10 MG tablet Take 10 mg by mouth daily.     . vitamin B-12 (CYANOCOBALAMIN) 1000 MCG tablet Take 500 mcg by mouth daily.     . traZODone (DESYREL) 50 MG tablet Take 1 tablet (50 mg total) by mouth at bedtime. (Patient taking differently: Take 50 mg by mouth as needed. ) 90 tablet 1   No facility-administered medications prior to visit.     ROS: Review of Systems  Constitutional: Negative for activity change, appetite change, chills, fatigue and unexpected weight change.  HENT: Negative for congestion, mouth sores and sinus pressure.   Eyes: Negative for visual disturbance.  Respiratory: Negative for cough and chest tightness.   Gastrointestinal: Negative for abdominal pain and nausea.  Genitourinary: Negative for difficulty urinating, frequency and vaginal pain.  Musculoskeletal: Positive for back pain. Negative for gait problem.  Skin: Negative for pallor and rash.  Neurological: Positive for headaches. Negative for dizziness,  tremors, weakness and numbness.  Psychiatric/Behavioral: Negative for confusion and sleep disturbance.    Objective:  BP 136/82 (BP Location: Left Arm, Patient Position: Sitting, Cuff Size: Normal)   Pulse 68   Temp 98 F (36.7 C) (Oral)   Ht 5\' 1"  (1.549 m)   Wt 197 lb (89.4 kg)   SpO2 97%   BMI 37.22 kg/m   BP Readings from Last 3 Encounters:  07/08/18 136/82  05/20/18 140/76  05/14/18 131/85    Wt Readings from Last 3 Encounters:  07/08/18 197 lb (89.4 kg)  05/20/18 200 lb (90.7 kg)  05/14/18 203 lb (92.1 kg)    Physical Exam Constitutional:      General: She is not in acute distress.    Appearance: She is well-developed.  HENT:     Head: Normocephalic.      Right Ear: External ear normal.     Left Ear: External ear normal.     Nose: Nose normal.  Eyes:     General:        Right eye: No discharge.        Left eye: No discharge.     Conjunctiva/sclera: Conjunctivae normal.     Pupils: Pupils are equal, round, and reactive to light.  Neck:     Musculoskeletal: Normal range of motion and neck supple.     Thyroid: No thyromegaly.     Vascular: No JVD.     Trachea: No tracheal deviation.  Cardiovascular:     Rate and Rhythm: Normal rate and regular rhythm.     Heart sounds: Normal heart sounds.  Pulmonary:     Effort: No respiratory distress.     Breath sounds: No stridor. No wheezing.  Abdominal:     General: Bowel sounds are normal. There is no distension.     Palpations: Abdomen is soft. There is no mass.     Tenderness: There is no abdominal tenderness. There is no guarding or rebound.  Musculoskeletal:        General: No tenderness.  Lymphadenopathy:     Cervical: No cervical adenopathy.  Skin:    Findings: No erythema or rash.  Neurological:     Cranial Nerves: No cranial nerve deficit.     Motor: No abnormal muscle tone.     Coordination: Coordination normal.     Deep Tendon Reflexes: Reflexes normal.  Psychiatric:        Behavior: Behavior normal.        Thought Content: Thought content normal.        Judgment: Judgment normal.     Lab Results  Component Value Date   WBC 7.0 04/16/2018   HGB 12.7 04/16/2018   HCT 38.4 04/16/2018   PLT 271.0 04/16/2018   GLUCOSE 99 04/16/2018   CHOL 193 02/04/2018   TRIG 252.0 (H) 02/04/2018   HDL 42.50 02/04/2018   LDLDIRECT 110.0 02/04/2018   LDLCALC 102 (H) 01/16/2017   ALT 11 02/04/2018   AST 10 02/04/2018   NA 141 04/16/2018   K 4.5 04/16/2018   CL 105 04/16/2018   CREATININE 0.80 04/16/2018   BUN 14 04/16/2018   CO2 29 04/16/2018   TSH 1.26 02/04/2018   INR 0.99 01/26/2017    Mr Brain W Wo Contrast  Result Date: 05/06/2018 CLINICAL DATA:  Left-sided headache  over the last 3 months. EXAM: MRI HEAD WITHOUT AND WITH CONTRAST TECHNIQUE: Multiplanar, multiecho pulse sequences of the brain and surrounding structures were obtained without and with intravenous  contrast. CONTRAST:  55mL MULTIHANCE GADOBENATE DIMEGLUMINE 529 MG/ML IV SOLN COMPARISON:  CT 02/19/2018 FINDINGS: Brain: Diffusion imaging does not show any acute or subacute infarction. 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 No focal cerebellar finding. Cerebral hemispheres show chronic small-vessel ischemic changes of the thalami, basal ganglia and hemispheric white matter of a mild-to-moderate degree. No cortical or large vessel territory infarction. No mass lesion, hemorrhage, hydrocephalus or extra-axial collection. After contrast administration, no abnormal enhancement occurs. Vascular: Major vessels at the base of the brain show flow. Skull and upper cervical spine: Negative Sinuses/Orbits: Inflammatory changes of the sphenoid sinus which could be associated with headache. Other sinuses are clear. Orbits negative. Other: None IMPRESSION: Sphenoid sinus inflammatory change which could be associated with headache. Mild chronic appearing small vessel ischemic changes elsewhere throughout the brain. Electronically Signed   By: Nelson Chimes [name]   On: 05/06/2018 09:45    Assessment & Plan:   There are no diagnoses linked to this encounter.   No orders of the defined types were placed in this encounter.    Follow-up: No follow-ups on file.  Walker Kehr, MD

## 2018-07-08 NOTE — Assessment & Plan Note (Signed)
On B12 

## 2018-07-08 NOTE — Assessment & Plan Note (Signed)
Wt Readings from Last 3 Encounters:  07/08/18 197 lb (89.4 kg)  05/20/18 200 lb (90.7 kg)  05/14/18 203 lb (92.1 kg)

## 2018-07-08 NOTE — Patient Instructions (Addendum)
Cardiac CT calcium scoring test $150   Computed tomography, more commonly known as a CT or CAT scan, is a diagnostic medical imaging test. Like traditional x-rays, it produces multiple images or pictures of the inside of the body. The cross-sectional images generated during a CT scan can be reformatted in multiple planes. They can even generate three-dimensional images. These images can be viewed on a computer monitor, printed on film or by a 3D printer, or transferred to a CD or DVD. CT images of internal organs, bones, soft tissue and blood vessels provide greater detail than traditional x-rays, particularly of soft tissues and blood vessels. A cardiac CT scan for coronary calcium is a non-invasive way of obtaining information about the presence, location and extent of calcified plaque in the coronary arteries-the vessels that supply oxygen-containing blood to the heart muscle. Calcified plaque results when there is a build-up of fat and other substances under the inner layer of the artery. This material can calcify which signals the presence of atherosclerosis, a disease of the vessel wall, also called coronary artery disease (CAD). People with this disease have an increased risk for heart attacks. In addition, over time, progression of plaque build up (CAD) can narrow the arteries or even close off blood flow to the heart. The result may be chest pain, sometimes called "angina," or a heart attack. Because calcium is a marker of CAD, the amount of calcium detected on a cardiac CT scan is a helpful prognostic tool. The findings on cardiac CT are expressed as a calcium score. Another name for this test is coronary artery calcium scoring.  What are some common uses of the procedure? The goal of cardiac CT scan for calcium scoring is to determine if CAD is present and to what extent, even if there are no symptoms. It is a screening study that may be recommended by a physician for patients with risk factors  for CAD but no clinical symptoms. The major risk factors for CAD are: . high blood cholesterol levels  . family history of heart attacks  . diabetes  . high blood pressure  . cigarette smoking  . overweight or obese  . physical inactivity   A negative cardiac CT scan for calcium scoring shows no calcification within the coronary arteries. This suggests that CAD is absent or so minimal it cannot be seen by this technique. The chance of having a heart attack over the next two to five years is very low under these circumstances. A positive test means that CAD is present, regardless of whether or not the patient is experiencing any symptoms. The amount of calcification-expressed as the calcium score-may help to predict the likelihood of a myocardial infarction (heart attack) in the coming years and helps your medical doctor or cardiologist decide whether the patient may need to take preventive medicine or undertake other measures such as diet and exercise to lower the risk for heart attack. The extent of CAD is graded according to your calcium score:  Calcium Score  Presence of CAD  0 No evidence of CAD   1-10 Minimal evidence of CAD  11-100 Mild evidence of CAD  101-400 Moderate evidence of CAD  Over 400 Extensive evidence of CAD   Weighted blanket

## 2018-07-08 NOTE — Assessment & Plan Note (Addendum)
Dr Constance Holster - endoscopic surgery in Feb 2020 Off abx

## 2018-07-12 NOTE — H&P (Signed)
[name] is an 73 y.o. female.   Chief Complaint: chronic headache HPI: History of chronic headache, chronic isolated left shpenoid sinusitis.  Past Medical History:  Diagnosis Date  . Anxiety   . B12 DEFICIENCY   . Cholecystitis, unspecified   . DEPRESSION   . Diastolic dysfunction   . DIVERTICULOSIS, COLON   . DYSLIPIDEMIA   . Dysrhythmia    PVC's  . Fibromyalgia   . GERD   . GERD (gastroesophageal reflux disease)   . Hyperplastic colon polyp   . HYPERTENSION   . INSOMNIA, PERSISTENT   . Morbid obesity ([place] [facility])   . OSTEOARTHRITIS   . PONV (postoperative nausea and vomiting)   . Skin cancer 2018   upper lip   . SYNCOPE   . Ulcer of esophagus   . VITAMIN D DEFICIENCY     Past Surgical History:  Procedure Laterality Date  . ABDOMINAL HYSTERECTOMY    . APPENDECTOMY    . BREAST BIOPSY Left 2010   Stero biopsy  . BREAST EXCISIONAL BIOPSY Left 1985   Benign   . CESAREAN SECTION    . CHOLECYSTECTOMY    . GASTROPLASTY    . gatric stapling    . LAPAROSCOPY N/A 01/30/2017   Procedure: LAPAROSCOPY DIAGNOSTIC WITH LAPAROSCOPIC LYSIS OF ADHESIONS;  Surgeon: Excell Seltzer, MD;  Location: [facility];  Service: General;  Laterality: N/A;  . MIDDLE EAR SURGERY Right   . revearsal of gastric stapling    . TONSILLECTOMY    . TOTAL KNEE ARTHROPLASTY     rt.  . TUBAL LIGATION    . TYMPANOPLASTY Right 10/22/2016   Procedure: RIGHT TYMPANOPLASTY;  Surgeon: Izora Gala, MD;  Location: [place];  Service: ENT;  Laterality: Right;  . TYMPANOPLASTY Right 09/02/2017   Procedure: REVISION RIGHT TYMPANOPLASTY;  Surgeon: Izora Gala, MD;  Location: [place];  Service: ENT;  Laterality: Right;    Family History  Problem Relation Age of Onset  . Emphysema Father   . Heart disease Father   . Arthritis Mother   . Hypertension Mother        pulmonary  . Heart disease Mother   . Colon cancer Maternal Aunt   . Diabetes Sister   . Lung cancer Sister    . Colon cancer Maternal Uncle   . Colon cancer Other        nephew  . Diabetes Maternal Aunt        x 4  . Diabetes Maternal Uncle        x 3  . Diabetes Paternal Uncle         1  . Kidney disease Other        niece  . Diabetes Sister   . COPD Sister   . Heart Problems Daughter   . Diabetes Daughter   . Breast cancer Paternal Aunt 41  . Esophageal cancer Neg Hx   . Liver disease Neg Hx    Social History:  reports that she has never smoked. She has never used smokeless tobacco. She reports that she does not drink alcohol or use drugs.  Allergies:  Allergies  Allergen Reactions  . Levaquin [Levofloxacin]     nausea    No medications prior to admission.    No results found for this or any previous visit (from the past 48 hour(s)). No results found.  ROS: otherwise negative  There were no vitals taken for this visit.  PHYSICAL EXAM: Overall appearance:  Healthy appearing, in no distress Head:  Normocephalic, atraumatic. Ears: External auditory canals are clear; tympanic membranes are intact and the middle ears are free of any effusion. Nose: External nose is healthy in appearance. Internal nasal exam free of any lesions or obstruction. Oral Cavity/pharynx:  There are no mucosal lesions or masses identified. Hypopharynx/Larynx: no signs of any mucosal lesions or masses identified. Vocal cords move normally. Neuro:  No identifiable neurologic deficits. Neck: No palpable neck masses.  Studies Reviewed: Sinuc CT with chronic left sphenoid sinus disease    Assessment/Plan Chronic left sphenoid sinusitis, endoscopic sphenoidotomy.  Izora Gala 07/12/2018, 11:17 AM

## 2018-07-30 ENCOUNTER — Encounter (HOSPITAL_BASED_OUTPATIENT_CLINIC_OR_DEPARTMENT_OTHER)
Admission: RE | Admit: 2018-07-30 | Discharge: 2018-07-30 | Disposition: A | Discharge status: Home / Self Care | Payer: Medicare Other | Source: Ambulatory Visit | Attending: Otolaryngology | Admitting: Otolaryngology

## 2018-07-30 ENCOUNTER — Encounter (HOSPITAL_BASED_OUTPATIENT_CLINIC_OR_DEPARTMENT_OTHER): Payer: Self-pay | Admitting: *Deleted

## 2018-07-30 ENCOUNTER — Other Ambulatory Visit: Payer: Self-pay

## 2018-07-30 DIAGNOSIS — Z01810 Encounter for preprocedural cardiovascular examination: Secondary | ICD-10-CM | POA: Insufficient documentation

## 2018-08-06 ENCOUNTER — Encounter (HOSPITAL_BASED_OUTPATIENT_CLINIC_OR_DEPARTMENT_OTHER): Payer: Self-pay | Admitting: Anesthesiology

## 2018-08-06 ENCOUNTER — Encounter (HOSPITAL_BASED_OUTPATIENT_CLINIC_OR_DEPARTMENT_OTHER)
Admission: RE | Disposition: A | Discharge status: Home / Self Care | Payer: Self-pay | Source: Home / Self Care | Attending: Otolaryngology

## 2018-08-06 ENCOUNTER — Ambulatory Visit (HOSPITAL_BASED_OUTPATIENT_CLINIC_OR_DEPARTMENT_OTHER): Payer: Medicare Other | Admitting: Anesthesiology

## 2018-08-06 ENCOUNTER — Ambulatory Visit (HOSPITAL_BASED_OUTPATIENT_CLINIC_OR_DEPARTMENT_OTHER)
Admission: RE | Admit: 2018-08-06 | Discharge: 2018-08-06 | Disposition: A | Discharge status: Home / Self Care | Payer: Medicare Other | Attending: Otolaryngology | Admitting: Otolaryngology

## 2018-08-06 DIAGNOSIS — F419 Anxiety disorder, unspecified: Secondary | ICD-10-CM | POA: Insufficient documentation

## 2018-08-06 DIAGNOSIS — F329 Major depressive disorder, single episode, unspecified: Secondary | ICD-10-CM | POA: Diagnosis not present

## 2018-08-06 DIAGNOSIS — M1990 Unspecified osteoarthritis, unspecified site: Secondary | ICD-10-CM | POA: Insufficient documentation

## 2018-08-06 DIAGNOSIS — J323 Chronic sphenoidal sinusitis: Secondary | ICD-10-CM | POA: Diagnosis present

## 2018-08-06 DIAGNOSIS — M797 Fibromyalgia: Secondary | ICD-10-CM | POA: Diagnosis not present

## 2018-08-06 DIAGNOSIS — E785 Hyperlipidemia, unspecified: Secondary | ICD-10-CM | POA: Insufficient documentation

## 2018-08-06 DIAGNOSIS — K219 Gastro-esophageal reflux disease without esophagitis: Secondary | ICD-10-CM | POA: Insufficient documentation

## 2018-08-06 DIAGNOSIS — I10 Essential (primary) hypertension: Secondary | ICD-10-CM | POA: Diagnosis not present

## 2018-08-06 DIAGNOSIS — E559 Vitamin D deficiency, unspecified: Secondary | ICD-10-CM | POA: Diagnosis not present

## 2018-08-06 DIAGNOSIS — Z79899 Other long term (current) drug therapy: Secondary | ICD-10-CM | POA: Insufficient documentation

## 2018-08-06 DIAGNOSIS — Z6837 Body mass index (BMI) 37.0-37.9, adult: Secondary | ICD-10-CM | POA: Diagnosis not present

## 2018-08-06 DIAGNOSIS — Z96659 Presence of unspecified artificial knee joint: Secondary | ICD-10-CM | POA: Insufficient documentation

## 2018-08-06 DIAGNOSIS — Z881 Allergy status to other antibiotic agents status: Secondary | ICD-10-CM | POA: Insufficient documentation

## 2018-08-06 HISTORY — PX: NASAL SINUS SURGERY: SHX719

## 2018-08-06 HISTORY — DX: Chronic sinusitis, unspecified: J32.9

## 2018-08-06 SURGERY — SINUS SURGERY, ENDOSCOPIC
Anesthesia: General | Site: Nose | Laterality: Left

## 2018-08-06 MED ORDER — BACITRACIN ZINC 500 UNIT/GM EX OINT
TOPICAL_OINTMENT | CUTANEOUS | Status: AC
  Filled 2018-08-06: qty 28.35

## 2018-08-06 MED ORDER — OXYMETAZOLINE HCL 0.05 % NA SOLN
NASAL | Status: AC
  Filled 2018-08-06: qty 30

## 2018-08-06 MED ORDER — OXYMETAZOLINE HCL 0.05 % NA SOLN
2.0000 | NASAL | Status: DC
  Administered 2018-08-06 (×2): 2 via NASAL

## 2018-08-06 MED ORDER — OXYCODONE HCL 5 MG PO TABS
5.0000 mg | ORAL_TABLET | Freq: Once | ORAL | Status: AC | PRN
  Administered 2018-08-06: 5 mg via ORAL

## 2018-08-06 MED ORDER — ONDANSETRON HCL 4 MG/2ML IJ SOLN
INTRAMUSCULAR | Status: DC | PRN
  Administered 2018-08-06: 4 mg via INTRAVENOUS

## 2018-08-06 MED ORDER — LIDOCAINE 2% (20 MG/ML) 5 ML SYRINGE
INTRAMUSCULAR | Status: DC | PRN
  Administered 2018-08-06: 60 mg via INTRAVENOUS

## 2018-08-06 MED ORDER — OXYCODONE HCL 5 MG PO TABS
ORAL_TABLET | ORAL | Status: AC
  Filled 2018-08-06: qty 1

## 2018-08-06 MED ORDER — FENTANYL CITRATE (PF) 100 MCG/2ML IJ SOLN: 50.0000 ug | INTRAMUSCULAR | Status: DC | PRN

## 2018-08-06 MED ORDER — ACETAMINOPHEN 500 MG PO TABS: 1000.0000 mg | ORAL_TABLET | Freq: Once | ORAL | Status: DC | PRN

## 2018-08-06 MED ORDER — SCOPOLAMINE 1 MG/3DAYS TD PT72
MEDICATED_PATCH | TRANSDERMAL | Status: AC
  Filled 2018-08-06: qty 1

## 2018-08-06 MED ORDER — SCOPOLAMINE 1 MG/3DAYS TD PT72
1.0000 | MEDICATED_PATCH | Freq: Once | TRANSDERMAL | Status: DC | PRN
  Administered 2018-08-06: 1.5 mg via TRANSDERMAL

## 2018-08-06 MED ORDER — ROCURONIUM BROMIDE 50 MG/5ML IV SOSY
PREFILLED_SYRINGE | INTRAVENOUS | Status: AC
  Filled 2018-08-06: qty 5

## 2018-08-06 MED ORDER — LACTATED RINGERS IV SOLN
INTRAVENOUS | Status: DC
  Administered 2018-08-06: 08:00:00 via INTRAVENOUS

## 2018-08-06 MED ORDER — HYDROCODONE-ACETAMINOPHEN 7.5-325 MG PO TABS: 1.0000 | ORAL_TABLET | Freq: Four times a day (QID) | ORAL | 0 refills | Status: DC | PRN

## 2018-08-06 MED ORDER — ROCURONIUM BROMIDE 50 MG/5ML IV SOSY
PREFILLED_SYRINGE | INTRAVENOUS | Status: DC | PRN
  Administered 2018-08-06: 40 mg via INTRAVENOUS

## 2018-08-06 MED ORDER — SUGAMMADEX SODIUM 200 MG/2ML IV SOLN
INTRAVENOUS | Status: AC
  Filled 2018-08-06: qty 2

## 2018-08-06 MED ORDER — DEXAMETHASONE SODIUM PHOSPHATE 4 MG/ML IJ SOLN
INTRAMUSCULAR | Status: DC | PRN
  Administered 2018-08-06: 10 mg via INTRAVENOUS

## 2018-08-06 MED ORDER — FENTANYL CITRATE (PF) 100 MCG/2ML IJ SOLN
INTRAMUSCULAR | Status: DC | PRN
  Administered 2018-08-06: 100 ug via INTRAVENOUS

## 2018-08-06 MED ORDER — ACETAMINOPHEN 10 MG/ML IV SOLN: 1000.0000 mg | Freq: Once | INTRAVENOUS | Status: DC | PRN

## 2018-08-06 MED ORDER — OXYCODONE HCL 5 MG/5ML PO SOLN: 5.0000 mg | Freq: Once | ORAL | Status: AC | PRN

## 2018-08-06 MED ORDER — LIDOCAINE-EPINEPHRINE 1 %-1:100000 IJ SOLN
INTRAMUSCULAR | Status: DC | PRN
  Administered 2018-08-06: 1 mL

## 2018-08-06 MED ORDER — MIDAZOLAM HCL 2 MG/2ML IJ SOLN: 1.0000 mg | INTRAMUSCULAR | Status: DC | PRN

## 2018-08-06 MED ORDER — ACETAMINOPHEN 160 MG/5ML PO SOLN: 1000.0000 mg | Freq: Once | ORAL | Status: DC | PRN

## 2018-08-06 MED ORDER — DEXAMETHASONE SODIUM PHOSPHATE 10 MG/ML IJ SOLN
INTRAMUSCULAR | Status: AC
  Filled 2018-08-06: qty 1

## 2018-08-06 MED ORDER — LIDOCAINE 2% (20 MG/ML) 5 ML SYRINGE
INTRAMUSCULAR | Status: AC
  Filled 2018-08-06: qty 5

## 2018-08-06 MED ORDER — LIDOCAINE-EPINEPHRINE 1 %-1:100000 IJ SOLN
INTRAMUSCULAR | Status: AC
  Filled 2018-08-06: qty 1

## 2018-08-06 MED ORDER — FENTANYL CITRATE (PF) 100 MCG/2ML IJ SOLN
INTRAMUSCULAR | Status: AC
  Filled 2018-08-06: qty 2

## 2018-08-06 MED ORDER — PROPOFOL 10 MG/ML IV BOLUS
INTRAVENOUS | Status: DC | PRN
  Administered 2018-08-06: 150 mg via INTRAVENOUS

## 2018-08-06 MED ORDER — ONDANSETRON HCL 4 MG/2ML IJ SOLN
INTRAMUSCULAR | Status: AC
  Filled 2018-08-06: qty 2

## 2018-08-06 MED ORDER — FENTANYL CITRATE (PF) 100 MCG/2ML IJ SOLN
25.0000 ug | INTRAMUSCULAR | Status: DC | PRN
  Administered 2018-08-06: 50 ug via INTRAVENOUS

## 2018-08-06 SURGICAL SUPPLY — 49 items
ATTRACTOMAT 16X20 MAGNETIC DRP (DRAPES) IMPLANT
BLADE RAD40 ROTATE 4M 4 5PK (BLADE) IMPLANT
BLADE RAD40 ROTATE 4M 4MM 5PK (BLADE)
BLADE RAD60 ROTATE M4 4 5PK (BLADE) IMPLANT
BLADE RAD60 ROTATE M4 4MM 5PK (BLADE)
BLADE TRICUT ROTATE M4 4 5PK (BLADE) ×1 IMPLANT
BLADE TRICUT ROTATE M4 4MM 5PK (BLADE) ×1
BUR HS RAD FRONTAL 3 (BURR) IMPLANT
BUR HS RAD FRONTAL 3MM (BURR)
CANISTER SUC SOCK COL 7IN (MISCELLANEOUS) ×5 IMPLANT
CANISTER SUCT 1200ML W/VALVE (MISCELLANEOUS) ×6 IMPLANT
CORD BIPOLAR FORCEPS 12FT (ELECTRODE) ×2 IMPLANT
COVER WAND RF STERILE (DRAPES) IMPLANT
DECANTER SPIKE VIAL GLASS SM (MISCELLANEOUS) ×2 IMPLANT
DRESSING ADAPTIC 1/2  N-ADH (PACKING) IMPLANT
DRESSING NASAL KENNEDY 3.5X.9 (MISCELLANEOUS) IMPLANT
DRSG NASAL KENNEDY 3.5X.9 (MISCELLANEOUS)
DRSG NASAL KENNEDY LMNT 8CM (GAUZE/BANDAGES/DRESSINGS) IMPLANT
DRSG NASOPORE 8CM (GAUZE/BANDAGES/DRESSINGS) ×3 IMPLANT
DRSG TELFA 3X8 NADH (GAUZE/BANDAGES/DRESSINGS) IMPLANT
GAUZE 4X4 16PLY RFD (DISPOSABLE) IMPLANT
GAUZE VASELINE FOILPK 1/2 X 72 (GAUZE/BANDAGES/DRESSINGS) IMPLANT
GLOVE BIO SURGEON STRL SZ7 (GLOVE) ×2 IMPLANT
GLOVE BIOGEL PI IND STRL 7.5 (GLOVE) IMPLANT
GLOVE BIOGEL PI INDICATOR 7.5 (GLOVE) ×2
GLOVE ECLIPSE 7.5 STRL STRAW (GLOVE) ×3 IMPLANT
GOWN STRL REUS W/ TWL LRG LVL3 (GOWN DISPOSABLE) ×2 IMPLANT
GOWN STRL REUS W/TWL LRG LVL3 (GOWN DISPOSABLE) ×6
HEMOSTAT SURGICEL .5X2 ABSORB (HEMOSTASIS) IMPLANT
IV NS 500ML (IV SOLUTION) ×3
IV NS 500ML BAXH (IV SOLUTION) IMPLANT
NDL PRECISIONGLIDE 27X1.5 (NEEDLE) ×1 IMPLANT
NDL SPNL 25GX3.5 QUINCKE BL (NEEDLE) IMPLANT
NEEDLE PRECISIONGLIDE 27X1.5 (NEEDLE) ×3 IMPLANT
NEEDLE SPNL 25GX3.5 QUINCKE BL (NEEDLE) ×3 IMPLANT
NS IRRIG 1000ML POUR BTL (IV SOLUTION) ×3 IMPLANT
PACK BASIN DAY SURGERY FS (CUSTOM PROCEDURE TRAY) ×3 IMPLANT
PACK ENT DAY SURGERY (CUSTOM PROCEDURE TRAY) ×3 IMPLANT
PAD DRESSING TELFA 3X8 NADH (GAUZE/BANDAGES/DRESSINGS) IMPLANT
PATTIES SURGICAL .5 X3 (DISPOSABLE) ×3 IMPLANT
SLEEVE SCD COMPRESS KNEE MED (MISCELLANEOUS) ×3 IMPLANT
SOLUTION BUTLER CLEAR DIP (MISCELLANEOUS) ×3 IMPLANT
SPONGE GAUZE 2X2 8PLY STER LF (GAUZE/BANDAGES/DRESSINGS) ×1
SPONGE GAUZE 2X2 8PLY STRL LF (GAUZE/BANDAGES/DRESSINGS) ×2 IMPLANT
SPONGE SURGIFOAM ABS GEL 12-7 (HEMOSTASIS) IMPLANT
TOWEL GREEN STERILE FF (TOWEL DISPOSABLE) ×3 IMPLANT
TUBE CONNECTING 20'X1/4 (TUBING) ×1
TUBE CONNECTING 20X1/4 (TUBING) ×1 IMPLANT
YANKAUER SUCT BULB TIP NO VENT (SUCTIONS) ×3 IMPLANT

## 2018-08-06 NOTE — Op Note (Signed)
OPERATIVE REPORT  DATE OF SURGERY: 08/06/2018  PATIENT:  [name],  73 y.o. female  PRE-OPERATIVE DIAGNOSIS:  Chronic sphenoid sinusitis  POST-OPERATIVE DIAGNOSIS:  Chronic sphenoid sinusitis  PROCEDURE:  Procedure(s): Endoscopic SPHENOIDOTOMY, left  SURGEON:  Beckie Salts, MD  ASSISTANTS: None  ANESTHESIA:   General   EBL: 10 ml  DRAINS: None  LOCAL MEDICATIONS USED: 1% Xylocaine with epinephrine  SPECIMEN: Left sphenoid sinus contents  COUNTS:  Correct  PROCEDURE DETAILS: The patient was taken to the operating room and placed on the operating table in the supine position. Following induction of general endotracheal anesthesia, the left nasal cavity was inspected using a 0 degree nasal endoscope.  Oxymetazoline spray was used preoperatively in the nasal cavities.  The face of the left sphenoid was identified at the posterior aspect of the septum just above the nasopharynx and was infiltrated with local anesthetic solution using a spinal needle.  A polyp was identified just at the natural ostium.  This was removed using the microdebrider.  The natural ostium was then entered using a small straight suction.  The back wall was measured at just over 8 cm.  A Kerrison rongeur was used to enlarge the ostium inferiorly and medially.  Fragments of hard material were removed from the sinus, possibly fungal debris.  This was all sent for pathologic evaluation.  The sinus was irrigated with saline using a straight suction.  There is no further abnormalities identified.  A small piece of nasal pore was used to pack the sphenoid sinus.  There was minimal bleeding.  The patient was then awakened extubated and transferred to recovery in stable condition.  The left pupil was checked periodically throughout the case and there was no change in the normal response.    PATIENT DISPOSITION:  To PACU, stable

## 2018-08-06 NOTE — Anesthesia Procedure Notes (Signed)
Procedure Name: Intubation Date/Time: 08/06/2018 8:47 AM Performed by: Lieutenant Diego, CRNA Pre-anesthesia Checklist: Patient identified, Emergency Drugs available, Suction available and Patient being monitored Patient Re-evaluated:Patient Re-evaluated prior to induction Oxygen Delivery Method: Circle system utilized Preoxygenation: Pre-oxygenation with 100% oxygen Induction Type: IV induction Ventilation: Mask ventilation without difficulty Laryngoscope Size: Miller and 2 Grade View: Grade I Tube type: Oral Tube size: 7.0 mm Number of attempts: 1 Airway Equipment and Method: Stylet Placement Confirmation: ETT inserted through vocal cords under direct vision,  positive ETCO2 and breath sounds checked- equal and bilateral Secured at: 21 cm Tube secured with: Tape Dental Injury: Teeth and Oropharynx as per pre-operative assessment

## 2018-08-06 NOTE — Interval H&P Note (Signed)
History and Physical Interval Note:  08/06/2018 8:24 AM  [name]  has presented today for surgery, with the diagnosis of Chronic Sinusitis  The various methods of treatment have been discussed with the patient and family. After consideration of risks, benefits and other options for treatment, the patient has consented to  Procedure(s): Endoscopic SPHENOIDECTOMY (Left) as a surgical intervention .  The patient's history has been reviewed, patient examined, no change in status, stable for surgery.  I have reviewed the patient's chart and labs.  Questions were answered to the patient's satisfaction.     Izora Gala

## 2018-08-06 NOTE — Anesthesia Preprocedure Evaluation (Signed)
Anesthesia Evaluation  Patient identified by MRN, date of birth, ID band Patient awake    Reviewed: Allergy & Precautions, NPO status , Patient's Chart, lab work & pertinent test results  History of Anesthesia Complications (+) PONV and history of anesthetic complications  Airway Mallampati: II  TM Distance: >3 FB Neck ROM: Full    Dental  (+) Teeth Intact, Caps,    Pulmonary neg pulmonary ROS,    breath sounds clear to auscultation       Cardiovascular hypertension, Pt. on medications  Rhythm:Regular     Neuro/Psych  Headaches, PSYCHIATRIC DISORDERS Anxiety Depression  Neuromuscular disease    GI/Hepatic Neg liver ROS, PUD, GERD  Medicated and Controlled,  Endo/Other  Morbid obesity  Renal/GU negative Renal ROS     Musculoskeletal  (+) Arthritis , Fibromyalgia -  Abdominal   Peds  Hematology  (+) anemia ,   Anesthesia Other Findings   Reproductive/Obstetrics                             Anesthesia Physical Anesthesia Plan  ASA: II  Anesthesia Plan: General   Post-op Pain Management:    Induction: Intravenous  PONV Risk Score and Plan: 3 and Ondansetron, Dexamethasone, Propofol infusion and Scopolamine patch - Pre-op  Airway Management Planned: Oral ETT  Additional Equipment: None  Intra-op Plan:   Post-operative Plan: Extubation in OR  Informed Consent: I have reviewed the patients History and Physical, chart, labs and discussed the procedure including the risks, benefits and alternatives for the proposed anesthesia with the patient or authorized representative who has indicated his/her understanding and acceptance.     Dental advisory given  Plan Discussed with: CRNA and Surgeon  Anesthesia Plan Comments:         Anesthesia Quick Evaluation

## 2018-08-06 NOTE — Discharge Instructions (Signed)
Use nasal saline spray every hour while awake.  Avoid blowing your nose for 2 weeks.    Post Anesthesia Home Care Instructions  Activity: Get plenty of rest for the remainder of the day. A responsible individual must stay with you for 24 hours following the procedure.  For the next 24 hours, DO NOT: -Drive a car -Paediatric nurse -Drink alcoholic beverages -Take any medication unless instructed by your physician -Make any legal decisions or sign important papers.  Meals: Start with liquid foods such as gelatin or soup. Progress to regular foods as tolerated. Avoid greasy, spicy, heavy foods. If nausea and/or vomiting occur, drink only clear liquids until the nausea and/or vomiting subsides. Call your physician if vomiting continues.  Special Instructions/Symptoms: Your throat may feel dry or sore from the anesthesia or the breathing tube placed in your throat during surgery. If this causes discomfort, gargle with warm salt water. The discomfort should disappear within 24 hours.  If you had a scopolamine patch placed behind your ear for the management of post- operative nausea and/or vomiting:  1. The medication in the patch is effective for 72 hours, after which it should be removed.  Wrap patch in a tissue and discard in the trash. Wash hands thoroughly with soap and water. 2. You may remove the patch earlier than 72 hours if you experience unpleasant side effects which may include dry mouth, dizziness or visual disturbances. 3. Avoid touching the patch. Wash your hands with soap and water after contact with the patch.

## 2018-08-06 NOTE — Transfer of Care (Signed)
Immediate Anesthesia Transfer of Care Note  Patient: [name]  Procedure(s) Performed: Endoscopic SPHENOIDECTOMY (Left Nose)  Patient Location: PACU  Anesthesia Type:General  Level of Consciousness: awake  Airway & Oxygen Therapy: Patient Spontanous Breathing and Patient connected to face mask oxygen  Post-op Assessment: Report given to RN and Post -op Vital signs reviewed and stable  Post vital signs: Reviewed and stable  Last Vitals:  Vitals Value Taken Time  BP 128/65 08/06/2018  9:32 AM  Temp    Pulse 80 08/06/2018  9:33 AM  Resp 17 08/06/2018  9:33 AM  SpO2 98 % 08/06/2018  9:33 AM  Vitals shown include unvalidated device data.  Last Pain:  Vitals:   08/06/18 0743  TempSrc: Oral  PainSc: 0-No pain         Complications: No apparent anesthesia complications

## 2018-08-07 ENCOUNTER — Encounter (HOSPITAL_BASED_OUTPATIENT_CLINIC_OR_DEPARTMENT_OTHER): Payer: Self-pay | Admitting: Otolaryngology

## 2018-08-07 NOTE — Anesthesia Postprocedure Evaluation (Signed)
Anesthesia Post Note  Patient: [name]  Procedure(s) Performed: Endoscopic SPHENOIDECTOMY (Left Nose)     Patient location during evaluation: PACU Anesthesia Type: General Level of consciousness: awake and alert Pain management: pain level controlled Vital Signs Assessment: post-procedure vital signs reviewed and stable Respiratory status: spontaneous breathing, nonlabored ventilation, respiratory function stable and patient connected to nasal cannula oxygen Cardiovascular status: blood pressure returned to baseline and stable Postop Assessment: no apparent nausea or vomiting Anesthetic complications: no    Last Vitals:  Vitals:   08/06/18 1000 08/06/18 1022  BP:    Pulse:    Resp: 20 20  Temp:  36.8 C  SpO2: 96% 95%    Last Pain:  Vitals:   08/06/18 1022  TempSrc:   PainSc: 3                  Mattheus Rauls

## 2018-09-30 ENCOUNTER — Ambulatory Visit: Payer: Self-pay | Admitting: Neurology

## 2018-10-21 ENCOUNTER — Encounter: Payer: Self-pay | Admitting: Internal Medicine

## 2018-10-21 ENCOUNTER — Ambulatory Visit (INDEPENDENT_AMBULATORY_CARE_PROVIDER_SITE_OTHER): Payer: Medicare Other | Admitting: Internal Medicine

## 2018-10-21 DIAGNOSIS — E785 Hyperlipidemia, unspecified: Secondary | ICD-10-CM

## 2018-10-21 DIAGNOSIS — F5101 Primary insomnia: Secondary | ICD-10-CM | POA: Diagnosis not present

## 2018-10-21 DIAGNOSIS — I10 Essential (primary) hypertension: Secondary | ICD-10-CM | POA: Diagnosis not present

## 2018-10-21 DIAGNOSIS — E538 Deficiency of other specified B group vitamins: Secondary | ICD-10-CM | POA: Diagnosis not present

## 2018-10-21 DIAGNOSIS — G4489 Other headache syndrome: Secondary | ICD-10-CM

## 2018-10-21 DIAGNOSIS — J323 Chronic sphenoidal sinusitis: Secondary | ICD-10-CM | POA: Diagnosis not present

## 2018-10-21 DIAGNOSIS — G4700 Insomnia, unspecified: Secondary | ICD-10-CM

## 2018-10-21 MED ORDER — FLUTICASONE PROPIONATE 50 MCG/ACT NA SUSP: 2.0000 | Freq: Every day | NASAL | 3 refills | Status: DC

## 2018-10-21 MED ORDER — VERAPAMIL HCL ER 240 MG PO TBCR: 240.0000 mg | EXTENDED_RELEASE_TABLET | Freq: Every day | ORAL | 3 refills | Status: DC

## 2018-10-21 MED ORDER — LINACLOTIDE 290 MCG PO CAPS: ORAL_CAPSULE | ORAL | 3 refills | Status: DC

## 2018-10-21 MED ORDER — TRAZODONE HCL 50 MG PO TABS: 50.0000 mg | ORAL_TABLET | Freq: Every day | ORAL | 3 refills | Status: DC

## 2018-10-21 MED ORDER — LOVASTATIN 40 MG PO TABS: 40.0000 mg | ORAL_TABLET | Freq: Every day | ORAL | 3 refills | Status: DC

## 2018-10-21 MED ORDER — GABAPENTIN 100 MG PO CAPS: 100.0000 mg | ORAL_CAPSULE | Freq: Every day | ORAL | 3 refills | Status: DC

## 2018-10-21 MED ORDER — OMEPRAZOLE 40 MG PO CPDR: DELAYED_RELEASE_CAPSULE | ORAL | 3 refills | Status: DC

## 2018-10-21 NOTE — Assessment & Plan Note (Signed)
Continue with the verapamil

## 2018-10-21 NOTE — Assessment & Plan Note (Signed)
Continue with lovastatin

## 2018-10-21 NOTE — Assessment & Plan Note (Signed)
Trazodone at night as needed

## 2018-10-21 NOTE — Assessment & Plan Note (Signed)
On B12 

## 2018-10-21 NOTE — Progress Notes (Signed)
Virtual Visit via Video Note  I connected with [name] on 10/21/18 at  9:30 AM EDT by a video enabled telemedicine application and verified that I am speaking with the correct person using two identifiers.   I discussed the limitations of evaluation and management by telemedicine and the availability of in person appointments. The patient expressed understanding and agreed to proceed.  History of Present Illness: We need to follow-up on hypertension, insomnia, dyslipidemia.  The headache have resolved after sinus surgery  There has been no runny nose, cough, chest pain, shortness of breath, abdominal pain, diarrhea, constipation, arthralgias, skin rashes.   Observations/Objective: The patient appears to be in no acute distress, looks well.  Assessment and Plan:  See my Assessment and Plan. Follow Up Instructions:    I discussed the assessment and treatment plan with the patient. The patient was provided an opportunity to ask questions and all were answered. The patient agreed with the plan and demonstrated an understanding of the instructions.   The patient was advised to call back or seek an in-person evaluation if the symptoms worsen or if the condition fails to improve as anticipated.  I provided face-to-face time during this encounter. We were at different locations.   Walker Kehr, MD

## 2018-10-21 NOTE — Assessment & Plan Note (Signed)
Resolved after endoscopic sinus surgery

## 2018-10-21 NOTE — Assessment & Plan Note (Signed)
Resolved after endoscopic surgery

## 2018-11-05 ENCOUNTER — Other Ambulatory Visit: Payer: Self-pay | Admitting: Gynecology

## 2018-11-05 DIAGNOSIS — Z1231 Encounter for screening mammogram for malignant neoplasm of breast: Secondary | ICD-10-CM

## 2018-12-17 ENCOUNTER — Encounter: Payer: Self-pay | Admitting: Internal Medicine

## 2018-12-25 ENCOUNTER — Ambulatory Visit
Admission: RE | Admit: 2018-12-25 | Discharge: 2018-12-25 | Disposition: A | Discharge status: Home / Self Care | Payer: Medicare Other | Source: Ambulatory Visit | Attending: Gynecology | Admitting: Gynecology

## 2018-12-25 ENCOUNTER — Other Ambulatory Visit: Payer: Self-pay

## 2018-12-25 DIAGNOSIS — Z1231 Encounter for screening mammogram for malignant neoplasm of breast: Secondary | ICD-10-CM

## 2019-01-27 ENCOUNTER — Ambulatory Visit: Payer: Medicare Other | Admitting: *Deleted

## 2019-01-27 ENCOUNTER — Other Ambulatory Visit: Payer: Self-pay

## 2019-01-27 VITALS — Ht 60.0 in | Wt 195.0 lb

## 2019-01-27 DIAGNOSIS — Z86010 Personal history of colonic polyps: Secondary | ICD-10-CM

## 2019-01-27 MED ORDER — SUPREP BOWEL PREP KIT 17.5-3.13-1.6 GM/177ML PO SOLN: ORAL | 0 refills | Status: DC

## 2019-01-27 NOTE — Progress Notes (Signed)
Pt's previsit is done over the phone and all paperwork (prep instructions, blank consent form to just read over, pre-procedure acknowledgement form and stamped envelope) sent to patient  Pt is aware that care partner will wait in the car during procedure; if they feel like they will be too hot to wait in the car; they may wait in the lobby.  We want them to wear a mask (we do not have any that we can provide them), practice social distancing, and we will check their temperatures when they get here.  I did remind patient that their care partner needs to stay in the parking lot the entire time. Pt will wear mask into building.  Pt states she has PONV but has had no problem with Propofol  No egg or soy allergy  No home oxygen use or problems with anesthesia  No medications for weight loss taken  emmi information given

## 2019-02-04 ENCOUNTER — Ambulatory Visit (INDEPENDENT_AMBULATORY_CARE_PROVIDER_SITE_OTHER): Payer: Medicare Other | Admitting: Internal Medicine

## 2019-02-04 ENCOUNTER — Other Ambulatory Visit (INDEPENDENT_AMBULATORY_CARE_PROVIDER_SITE_OTHER): Payer: Medicare Other

## 2019-02-04 ENCOUNTER — Encounter: Payer: Self-pay | Admitting: Internal Medicine

## 2019-02-04 ENCOUNTER — Other Ambulatory Visit: Payer: Self-pay

## 2019-02-04 VITALS — BP 128/80 | HR 79 | Temp 97.5°F | Ht 60.0 in | Wt 204.0 lb

## 2019-02-04 DIAGNOSIS — Z23 Encounter for immunization: Secondary | ICD-10-CM | POA: Diagnosis not present

## 2019-02-04 DIAGNOSIS — E538 Deficiency of other specified B group vitamins: Secondary | ICD-10-CM | POA: Diagnosis not present

## 2019-02-04 DIAGNOSIS — F411 Generalized anxiety disorder: Secondary | ICD-10-CM

## 2019-02-04 DIAGNOSIS — Z0000 Encounter for general adult medical examination without abnormal findings: Secondary | ICD-10-CM | POA: Diagnosis not present

## 2019-02-04 DIAGNOSIS — I10 Essential (primary) hypertension: Secondary | ICD-10-CM

## 2019-02-04 DIAGNOSIS — E559 Vitamin D deficiency, unspecified: Secondary | ICD-10-CM

## 2019-02-04 LAB — CBC WITH DIFFERENTIAL/PLATELET
Basophils Absolute: 0.1 10*3/uL (ref 0.0–0.1)
Basophils Relative: 0.7 % (ref 0.0–3.0)
Eosinophils Absolute: 0.2 10*3/uL (ref 0.0–0.7)
Eosinophils Relative: 1.9 % (ref 0.0–5.0)
HCT: 37.1 % (ref 36.0–46.0)
Hemoglobin: 12.2 g/dL (ref 12.0–15.0)
Lymphocytes Relative: 27.1 % (ref 12.0–46.0)
Lymphs Abs: 2.2 10*3/uL (ref 0.7–4.0)
MCHC: 32.8 g/dL (ref 30.0–36.0)
MCV: 90 fl (ref 78.0–100.0)
Monocytes Absolute: 0.5 10*3/uL (ref 0.1–1.0)
Monocytes Relative: 6.6 % (ref 3.0–12.0)
Neutro Abs: 5.2 10*3/uL (ref 1.4–7.7)
Neutrophils Relative %: 63.7 % (ref 43.0–77.0)
Platelets: 260 10*3/uL (ref 150.0–400.0)
RBC: 4.12 Mil/uL (ref 3.87–5.11)
RDW: 14.4 % (ref 11.5–15.5)
WBC: 8.1 10*3/uL (ref 4.0–10.5)

## 2019-02-04 LAB — URINALYSIS
Bilirubin Urine: NEGATIVE
Hgb urine dipstick: NEGATIVE
Ketones, ur: NEGATIVE
Leukocytes,Ua: NEGATIVE
Nitrite: NEGATIVE
Specific Gravity, Urine: 1.02 (ref 1.000–1.030)
Total Protein, Urine: NEGATIVE
Urine Glucose: NEGATIVE
Urobilinogen, UA: 4 — AB (ref 0.0–1.0)
pH: 7 (ref 5.0–8.0)

## 2019-02-04 LAB — LIPID PANEL
Cholesterol: 184 mg/dL (ref 0–200)
HDL: 41.3 mg/dL (ref >39.00)
NonHDL: 142.56
Total CHOL/HDL Ratio: 4
Triglycerides: 217 mg/dL — ABNORMAL HIGH (ref 0.0–149.0)
VLDL: 43.4 mg/dL — ABNORMAL HIGH (ref 0.0–40.0)

## 2019-02-04 LAB — HEPATIC FUNCTION PANEL
ALT: 13 U/L (ref 0–35)
AST: 16 U/L (ref 0–37)
Albumin: 4.1 g/dL (ref 3.5–5.2)
Alkaline Phosphatase: 92 U/L (ref 39–117)
Bilirubin, Direct: 0.1 mg/dL (ref 0.0–0.3)
Total Bilirubin: 0.7 mg/dL (ref 0.2–1.2)
Total Protein: 6.8 g/dL (ref 6.0–8.3)

## 2019-02-04 LAB — BASIC METABOLIC PANEL
BUN: 11 mg/dL (ref 6–23)
CO2: 30 mEq/L (ref 19–32)
Calcium: 9 mg/dL (ref 8.4–10.5)
Chloride: 103 mEq/L (ref 96–112)
Creatinine, Ser: 0.84 mg/dL (ref 0.40–1.20)
GFR: 66.41 mL/min (ref >60.00)
Glucose, Bld: 93 mg/dL (ref 70–99)
Potassium: 4.2 mEq/L (ref 3.5–5.1)
Sodium: 141 mEq/L (ref 135–145)

## 2019-02-04 LAB — TSH: TSH: 1.54 u[IU]/mL (ref 0.35–4.50)

## 2019-02-04 LAB — LDL CHOLESTEROL, DIRECT: Direct LDL: 111 mg/dL

## 2019-02-04 LAB — VITAMIN B12: Vitamin B-12: 327 pg/mL (ref 211–911)

## 2019-02-04 MED ORDER — ALPRAZOLAM 0.5 MG PO TABS: 0.5000 mg | ORAL_TABLET | Freq: Two times a day (BID) | ORAL | 3 refills | Status: DC | PRN

## 2019-02-04 NOTE — Assessment & Plan Note (Signed)
On B12 

## 2019-02-04 NOTE — Assessment & Plan Note (Signed)
Xananx  Potential benefits of a long term benzo use as well as potential risks  and complications were explained to the patient and were aknowledged.

## 2019-02-04 NOTE — Assessment & Plan Note (Signed)
On Verapamil 

## 2019-02-04 NOTE — Progress Notes (Signed)
Subjective:  Patient ID: [name], female    DOB: 1946/01/12  Age: 73 y.o. MRN: [id number]  CC: No chief complaint on file.   HPI [name] presents for OA, depression, B12 def f/u  Outpatient Medications Prior to Visit  Medication Sig Dispense Refill  . ALPRAZolam (XANAX) 0.5 MG tablet Take 1 tablet (0.5 mg total) by mouth 2 (two) times daily as needed. 60 tablet 3  . aspirin 81 MG tablet Take 81 mg by mouth daily.      Marland Kitchen buPROPion (WELLBUTRIN XL) 150 MG 24 hr tablet Take 1 tablet (150 mg total) by mouth daily. 90 tablet 3  . Cholecalciferol (VITAMIN D) 2000 UNITS CAPS Take 1 capsule by mouth daily.      . fish oil-omega-3 fatty acids 1000 MG capsule Take by mouth daily. Take  600 mg daily    . flurbiprofen (ANSAID) 100 MG tablet Take 1 tablet earliest onset of migraine. May take every 8 hours as needed, maximum 361m in 24h 30 tablet 3  . fluticasone (FLONASE) 50 MCG/ACT nasal spray Place 2 sprays into both nostrils daily. 48 g 3  . gabapentin (NEURONTIN) 100 MG capsule Take 1 capsule (100 mg total) by mouth at bedtime. 90 capsule 3  . linaclotide (LINZESS) 290 MCG CAPS capsule TAKE 1 CAPSULE BY MOUTH  DAILY BEFORE BREAKFAST 90 capsule 3  . lovastatin (MEVACOR) 40 MG tablet Take 1 tablet (40 mg total) by mouth at bedtime. 90 tablet 3  . Multiple Vitamins-Minerals (OCUVITE PO) Take 2 tablets by mouth daily.    . Na Sulfate-K Sulfate-Mg Sulf (SUPREP BOWEL PREP KIT) 17.5-3.13-1.6 GM/177ML SOLN Suprep as directed, no substitutions 354 mL 0  . omeprazole (PRILOSEC) 40 MG capsule TAKE 1 CAPSULE BY MOUTH  TWICE DAILY BEFORE MEALS 180 capsule 3  . polyethylene glycol (MIRALAX) packet Take 17 g by mouth daily. (Patient taking differently: Take 17 g by mouth as needed. ) 14 each 0  . Probiotic Product (ALIGN) 4 MG CAPS Take 1 capsule (4 mg total) by mouth daily. 30 capsule 1  . SUMAtriptan (IMITREX) 100 MG tablet Take 1 tablet (100 mg total) by mouth every 2 (two) hours as needed for  migraine. May repeat in 2 hours if headache persists or recurs. 12 tablet 5  . traZODone (DESYREL) 50 MG tablet Take 1 tablet (50 mg total) by mouth at bedtime. 90 tablet 3  . verapamil (CALAN-SR) 240 MG CR tablet Take 1 tablet (240 mg total) by mouth at bedtime. 90 tablet 3  . VESICARE 10 MG tablet Take 10 mg by mouth daily.     . vitamin B-12 (CYANOCOBALAMIN) 1000 MCG tablet Take 500 mcg by mouth daily.     .Marland Kitchenoxybutynin (DITROPAN-XL) 5 MG 24 hr tablet Take 1 tablet by mouth daily.     No facility-administered medications prior to visit.     ROS: Review of Systems  Constitutional: Negative for activity change, appetite change, chills, fatigue and unexpected weight change.  HENT: Negative for congestion, mouth sores and sinus pressure.   Eyes: Negative for visual disturbance.  Respiratory: Negative for cough and chest tightness.   Gastrointestinal: Negative for abdominal pain and nausea.  Genitourinary: Negative for difficulty urinating, frequency and vaginal pain.  Musculoskeletal: Positive for arthralgias. Negative for back pain and gait problem.  Skin: Negative for pallor and rash.  Neurological: Negative for dizziness, tremors, weakness, numbness and headaches.  Psychiatric/Behavioral: Negative for confusion, sleep disturbance and suicidal ideas.  Objective:  BP 128/80 (BP Location: Left Arm, Patient Position: Sitting, Cuff Size: Large)   Pulse 79   Temp (!) 97.5 F (36.4 C) (Oral)   Ht 5' (1.524 m)   Wt 204 lb (92.5 kg)   SpO2 95%   BMI 39.84 kg/m   BP Readings from Last 3 Encounters:  02/04/19 128/80  08/06/18 128/65  07/08/18 136/82    Wt Readings from Last 3 Encounters:  02/04/19 204 lb (92.5 kg)  01/27/19 195 lb (88.5 kg)  08/06/18 197 lb 15.6 oz (89.8 kg)    Physical Exam Constitutional:      General: She is not in acute distress.    Appearance: She is well-developed. She is obese.  HENT:     Head: Normocephalic.     Right Ear: External ear normal.      Left Ear: External ear normal.     Nose: Nose normal.  Eyes:     General:        Right eye: No discharge.        Left eye: No discharge.     Conjunctiva/sclera: Conjunctivae normal.     Pupils: Pupils are equal, round, and reactive to light.  Neck:     Musculoskeletal: Normal range of motion and neck supple.     Thyroid: No thyromegaly.     Vascular: No JVD.     Trachea: No tracheal deviation.  Cardiovascular:     Rate and Rhythm: Normal rate and regular rhythm.     Heart sounds: Normal heart sounds.  Pulmonary:     Effort: No respiratory distress.     Breath sounds: No stridor. No wheezing.  Abdominal:     General: Bowel sounds are normal. There is no distension.     Palpations: Abdomen is soft. There is no mass.     Tenderness: There is no abdominal tenderness. There is no guarding or rebound.  Musculoskeletal:        General: No tenderness.  Lymphadenopathy:     Cervical: No cervical adenopathy.  Skin:    Findings: No erythema or rash.  Neurological:     Cranial Nerves: No cranial nerve deficit.     Motor: No abnormal muscle tone.     Coordination: Coordination normal.     Gait: Gait abnormal.     Deep Tendon Reflexes: Reflexes normal.  Psychiatric:        Behavior: Behavior normal.        Thought Content: Thought content normal.        Judgment: Judgment normal.     Lab Results  Component Value Date   WBC 7.0 04/16/2018   HGB 12.7 04/16/2018   HCT 38.4 04/16/2018   PLT 271.0 04/16/2018   GLUCOSE 99 04/16/2018   CHOL 193 02/04/2018   TRIG 252.0 (H) 02/04/2018   HDL 42.50 02/04/2018   LDLDIRECT 110.0 02/04/2018   LDLCALC 102 (H) 01/16/2017   ALT 11 02/04/2018   AST 10 02/04/2018   NA 141 04/16/2018   K 4.5 04/16/2018   CL 105 04/16/2018   CREATININE 0.80 04/16/2018   BUN 14 04/16/2018   CO2 29 04/16/2018   TSH 1.26 02/04/2018   INR 0.99 01/26/2017    Mm 3d Screen Breast Bilateral  Result Date: 12/25/2018 CLINICAL DATA:  Screening. EXAM: DIGITAL  SCREENING BILATERAL MAMMOGRAM WITH TOMO AND CAD COMPARISON:  Previous exam(s). ACR Breast Density Category b: There are scattered areas of fibroglandular density. FINDINGS: There are no findings suspicious for malignancy.  Images were processed with CAD. IMPRESSION: No mammographic evidence of malignancy. A result letter of this screening mammogram will be mailed directly to the patient. RECOMMENDATION: Screening mammogram in one year. (Code:SM-B-01Y) BI-RADS CATEGORY  1: Negative. Electronically Signed   By: Everlean Alstrom [name]   On: 12/25/2018 16:56    Assessment & Plan:   Diagnoses and all orders for this visit:  Need for influenza vaccination -     Flu Vaccine QUAD High Dose(Fluad)     No orders of the defined types were placed in this encounter.    Follow-up: No follow-ups on file.  Walker Kehr, MD

## 2019-02-04 NOTE — Assessment & Plan Note (Signed)
On Vit D 

## 2019-02-06 ENCOUNTER — Encounter: Payer: Self-pay | Admitting: Internal Medicine

## 2019-02-09 ENCOUNTER — Telehealth: Payer: Self-pay

## 2019-02-09 ENCOUNTER — Other Ambulatory Visit: Payer: Self-pay | Admitting: Internal Medicine

## 2019-02-09 NOTE — Telephone Encounter (Signed)
Covid-19 screening questions   Do you now or have you had a fever in the last 14 days?  Do you have any respiratory symptoms of shortness of breath or cough now or in the last 14 days?  Do you have any family members or close contacts with diagnosed or suspected Covid-19 in the past 14 days?  Have you been tested for Covid-19 and found to be positive?     L/m to c/b.

## 2019-02-10 ENCOUNTER — Ambulatory Visit (AMBULATORY_SURGERY_CENTER): Payer: Medicare Other | Admitting: Internal Medicine

## 2019-02-10 ENCOUNTER — Other Ambulatory Visit: Payer: Self-pay

## 2019-02-10 ENCOUNTER — Encounter: Payer: Self-pay | Admitting: Internal Medicine

## 2019-02-10 VITALS — BP 161/70 | HR 82 | Temp 98.4°F | Resp 21 | Ht 60.0 in | Wt 195.0 lb

## 2019-02-10 DIAGNOSIS — Z86010 Personal history of colonic polyps: Secondary | ICD-10-CM

## 2019-02-10 DIAGNOSIS — D123 Benign neoplasm of transverse colon: Secondary | ICD-10-CM

## 2019-02-10 MED ORDER — SODIUM CHLORIDE 0.9 % IV SOLN: 500.0000 mL | Freq: Once | INTRAVENOUS | Status: DC

## 2019-02-10 NOTE — Progress Notes (Signed)
1135- pt c/o lower abdominal pain.  Entire abdomen is soft and palpable.  She is passing air.  Rating pain as a "5."   1141- pt is passing air- rating abdominal discomfort as a "4".  States relief when she passes air.  1148- pt is up to the bathroom to pass more air.  Abdomen is still soft.  Able to pass more air and states abdomen "feels better."

## 2019-02-10 NOTE — Progress Notes (Signed)
Pt's states no medical or surgical changes since previsit or office visit. 

## 2019-02-10 NOTE — Patient Instructions (Signed)
YOU HAD AN ENDOSCOPIC PROCEDURE TODAY AT [facility]:   Refer to the procedure report that was given to you for any specific questions about what was found during the examination.  If the procedure report does not answer your questions, please call your gastroenterologist to clarify.  If you requested that your care partner not be given the details of your procedure findings, then the procedure report has been included in a sealed envelope for you to review at your convenience later.  YOU SHOULD EXPECT: Some feelings of bloating in the abdomen. Passage of more gas than usual.  Walking can help get rid of the air that was put into your GI tract during the procedure and reduce the bloating. If you had a lower endoscopy (such as a colonoscopy or flexible sigmoidoscopy) you may notice spotting of blood in your stool or on the toilet paper. If you underwent a bowel prep for your procedure, you may not have a normal bowel movement for a few days.  Please Note:  You might notice some irritation and congestion in your nose or some drainage.  This is from the oxygen used during your procedure.  There is no need for concern and it should clear up in a day or so.  SYMPTOMS TO REPORT IMMEDIATELY:   Following lower endoscopy (colonoscopy or flexible sigmoidoscopy):  Excessive amounts of blood in the stool  Significant tenderness or worsening of abdominal pains  Swelling of the abdomen that is new, acute  Fever of 100F or higher  For urgent or emergent issues, a gastroenterologist can be reached at any hour by calling [phone].  DIET:  We do recommend a small meal at first, but then you may proceed to your regular diet.  Drink plenty of fluids but you should avoid alcoholic beverages for 24 hours.  ACTIVITY:  You should plan to take it easy for the rest of today and you should NOT DRIVE or use heavy machinery until tomorrow (because of the sedation medicines used during the test).     FOLLOW UP: Our staff will call the number listed on your records 48-72 hours following your procedure to check on you and address any questions or concerns that you may have regarding the information given to you following your procedure. If we do not reach you, we will leave a message.  We will attempt to reach you two times.  During this call, we will ask if you have developed any symptoms of COVID 19. If you develop any symptoms (ie: fever, flu-like symptoms, shortness of breath, cough etc.) before then, please call [phone].  If you test positive for Covid 19 in the 2 weeks post procedure, please call and report this information to [place].    If any biopsies were taken you will be contacted by phone or by letter within the next 1-3 weeks.  Please call us at [phone] if you have not heard about the biopsies in 3 weeks.   SIGNATURES/CONFIDENTIALITY: You and/or your care partner have signed paperwork which will be entered into your electronic medical record.  These signatures attest to the fact that that the information above on your After Visit Summary has been reviewed and is understood.  Full responsibility of the confidentiality of this discharge information lies with you and/or your care-partner.  Await pathology  Continue your normal medications  Please read over handout about polyps  No further colonoscopy recommended at this time, but please call office if you have  any problems in the future

## 2019-02-10 NOTE — Progress Notes (Signed)
MB temps, CW vitals and SM IV

## 2019-02-10 NOTE — Progress Notes (Signed)
PT taken to PACU. Monitors in place. VSS. Report given to RN. 

## 2019-02-10 NOTE — Op Note (Signed)
[name] Patient Name: [name] Procedure Date: 02/10/2019 10:45 AM MRN: [id number] Endoscopist: Docia Chuck. Henrene Pastor , MD Age: 73 Referring MD:  Date of Birth: 14-Nov-1945 Gender: Female Account #: [id number] Procedure:                Colonoscopy with cold snare polypectomy x 3 Indications:              High risk colon cancer surveillance: Personal                            history of multiple (3 or more) adenomas. Previous                            examinations 1998, 2003, 2008, 2013, 2015 Medicines:                Monitored Anesthesia Care Procedure:                Pre-Anesthesia Assessment:                           - Prior to the procedure, a History and Physical                            was performed, and patient medications and                            allergies were reviewed. The patient's tolerance of                            previous anesthesia was also reviewed. The risks                            and benefits of the procedure and the sedation                            options and risks were discussed with the patient.                            All questions were answered, and informed consent                            was obtained. Prior Anticoagulants: The patient has                            taken no previous anticoagulant or antiplatelet                            agents. ASA Grade Assessment: II - A patient with                            mild systemic disease. After reviewing the risks                            and benefits, the patient was deemed in  satisfactory condition to undergo the procedure.                           After obtaining informed consent, the colonoscope                            was passed under direct vision. Throughout the                            procedure, the patient's blood pressure, pulse, and                            oxygen saturations were monitored continuously. The          Colonoscope was introduced through the anus and                            advanced to the the cecum, identified by                            appendiceal orifice and ileocecal valve. The                            ileocecal valve, appendiceal orifice, and rectum                            were photographed. The quality of the bowel                            preparation was excellent. The colonoscopy was                            performed WITH difficulty. The patient tolerated                            the procedure well. The bowel preparation used was                            SUPREP via split dose instruction. Scope In: 10:54:45 AM Scope Out: 11:17:10 AM Scope Withdrawal Time: 0 hours 6 minutes 50 seconds  Total Procedure Duration: 0 hours 22 minutes 25 seconds  Findings:                 Three sessile polyps were found in the proximal                            transverse colon. The polyps were 2 to 4 mm in                            size. These polyps were removed with a cold snare                            (all on insertion). Resection and retrieval were  complete.                           The exam was otherwise without abnormality on                            direct and retroflexion views. 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 Complications:            No immediate complications. Estimated blood loss:                            None. Estimated Blood Loss:     Estimated blood loss: none. Impression:               - Three 2 to 4 mm polyps in the proximal transverse                            colon, removed with a cold snare. Resected and                            retrieved.                           - The examination was otherwise normal on direct                            and  retroflexion views. Difficult exam. Recommendation:           - Repeat colonoscopy is not recommended for                            surveillance.                           - Patient has a contact number available for                            emergencies. The signs and symptoms of potential                            delayed complications were discussed with the                            patient. Return to normal activities tomorrow.                            Written discharge instructions were provided to the  patient.                           - Resume previous diet.                           - Continue present medications.                           - Await pathology results. Docia Chuck. Henrene Pastor, MD 02/10/2019 11:26:20 AM This report has been signed electronically.

## 2019-02-12 ENCOUNTER — Telehealth: Payer: Self-pay | Admitting: *Deleted

## 2019-02-12 NOTE — Telephone Encounter (Signed)
1. Have you developed a fever since your procedure? no  2.   Have you had an respiratory symptoms (SOB or cough) since your procedure? no  3.   Have you tested positive for COVID 19 since your procedure no  4.   Have you had any family members/close contacts diagnosed with the COVID 19 since your procedure?  no   If yes to any of these questions please route to Joylene John, RN and Alphonsa Gin, Therapist, sports.  Follow up Call-  Call back number 02/10/2019  Post procedure Call Back phone  # [phone]  Permission to leave phone message Yes  Some recent data might be hidden     Patient questions:  Do you have a fever, pain , or abdominal swelling? No. Pain Score  0 *  Have you tolerated food without any problems? Yes.    Have you been able to return to your normal activities? Yes.    Do you have any questions about your discharge instructions: Diet   No. Medications  No. Follow up visit  No.  Do you have questions or concerns about your Care? No.  Actions: * If pain score is 4 or above: No action needed, pain <4.

## 2019-02-13 ENCOUNTER — Encounter: Payer: Self-pay | Admitting: Internal Medicine

## 2019-05-04 ENCOUNTER — Ambulatory Visit (INDEPENDENT_AMBULATORY_CARE_PROVIDER_SITE_OTHER): Payer: Medicare Other | Admitting: Internal Medicine

## 2019-05-04 ENCOUNTER — Encounter: Payer: Self-pay | Admitting: Internal Medicine

## 2019-05-04 DIAGNOSIS — H9201 Otalgia, right ear: Secondary | ICD-10-CM

## 2019-05-04 MED ORDER — IBUPROFEN 600 MG PO TABS: 600.0000 mg | ORAL_TABLET | Freq: Three times a day (TID) | ORAL | 0 refills | Status: DC | PRN

## 2019-05-04 MED ORDER — CEFUROXIME AXETIL 250 MG PO TABS: 250.0000 mg | ORAL_TABLET | Freq: Two times a day (BID) | ORAL | 0 refills | Status: DC

## 2019-05-04 NOTE — Assessment & Plan Note (Signed)
Otitis media versus other.  Differential diagnosis discussed.   Empiric ibuprofen 600 mg 3 times daily as needed and Ceftin to 50 mg twice a day for 10 days.  Report immediately if rash appears.  Warm compress

## 2019-05-04 NOTE — Progress Notes (Signed)
Virtual Visit via Video Note  I connected with [name] on 05/04/19 at 11:20 AM EST by a video enabled telemedicine application and verified that I am speaking with the correct person using two identifiers.   I discussed the limitations of evaluation and management by telemedicine and the availability of in person appointments. The patient expressed understanding and agreed to proceed.  History of Present Illness: The patient is complaining of severe right ear pain of 2 days duration.  She tried her son's swimmer ear drops with no relief and ibuprofen with mild relief.  No fever.  No rash.  There has been no runny nose, cough, chest pain, shortness of breath, abdominal pain, diarrhea, constipation, skin rashes.   Observations/Objective: The patient appears to be in no acute distress.  Assessment and Plan:  See my Assessment and Plan. Follow Up Instructions:    I discussed the assessment and treatment plan with the patient. The patient was provided an opportunity to ask questions and all were answered. The patient agreed with the plan and demonstrated an understanding of the instructions.   The patient was advised to call back or seek an in-person evaluation if the symptoms worsen or if the condition fails to improve as anticipated.  I provided face-to-face time during this encounter. We were at different locations.   Walker Kehr, MD

## 2019-06-05 ENCOUNTER — Other Ambulatory Visit: Payer: Self-pay | Admitting: Internal Medicine

## 2019-06-08 ENCOUNTER — Ambulatory Visit (INDEPENDENT_AMBULATORY_CARE_PROVIDER_SITE_OTHER): Payer: Medicare Other | Admitting: Internal Medicine

## 2019-06-08 ENCOUNTER — Encounter: Payer: Self-pay | Admitting: Internal Medicine

## 2019-06-08 ENCOUNTER — Other Ambulatory Visit: Payer: Self-pay

## 2019-06-08 VITALS — BP 148/84 | HR 68 | Temp 98.7°F | Ht 60.0 in | Wt 203.0 lb

## 2019-06-08 DIAGNOSIS — E538 Deficiency of other specified B group vitamins: Secondary | ICD-10-CM

## 2019-06-08 DIAGNOSIS — I10 Essential (primary) hypertension: Secondary | ICD-10-CM | POA: Diagnosis not present

## 2019-06-08 DIAGNOSIS — F4323 Adjustment disorder with mixed anxiety and depressed mood: Secondary | ICD-10-CM

## 2019-06-08 DIAGNOSIS — E559 Vitamin D deficiency, unspecified: Secondary | ICD-10-CM

## 2019-06-08 DIAGNOSIS — B079 Viral wart, unspecified: Secondary | ICD-10-CM

## 2019-06-08 MED ORDER — ALPRAZOLAM 0.5 MG PO TABS: 0.5000 mg | ORAL_TABLET | Freq: Two times a day (BID) | ORAL | 3 refills | Status: DC | PRN

## 2019-06-08 MED ORDER — GABAPENTIN 100 MG PO CAPS: 100.0000 mg | ORAL_CAPSULE | Freq: Every day | ORAL | 3 refills | Status: DC

## 2019-06-08 NOTE — Progress Notes (Signed)
Subjective:  Patient ID: [name], female    DOB: 05-29-1946  Age: 73 y.o. MRN: [id number]  CC: No chief complaint on file.   HPI [name] presents for anxiety, OA, OAB f/u BP OK at home  C/o a wart - R cheek  Outpatient Medications Prior to Visit  Medication Sig Dispense Refill  . ALPRAZolam (XANAX) 0.5 MG tablet Take 1 tablet (0.5 mg total) by mouth 2 (two) times daily as needed. 60 tablet 3  . aspirin 81 MG tablet Take 81 mg by mouth daily.      Marland Kitchen buPROPion (WELLBUTRIN XL) 150 MG 24 hr tablet Take 1 tablet (150 mg total) by mouth daily. 90 tablet 3  . cefUROXime (CEFTIN) 250 MG tablet Take 1 tablet (250 mg total) by mouth 2 (two) times daily. 20 tablet 0  . Cholecalciferol (VITAMIN D) 2000 UNITS CAPS Take 1 capsule by mouth daily.      . fish oil-omega-3 fatty acids 1000 MG capsule Take by mouth daily. Take  600 mg daily    . flurbiprofen (ANSAID) 100 MG tablet Take 1 tablet earliest onset of migraine. May take every 8 hours as needed, maximum 300mg  in 24h 30 tablet 3  . fluticasone (FLONASE) 50 MCG/ACT nasal spray Place 2 sprays into both nostrils daily. 48 g 3  . gabapentin (NEURONTIN) 100 MG capsule Take 1 capsule (100 mg total) by mouth at bedtime. 90 capsule 3  . ibuprofen (ADVIL) 600 MG tablet 1 TAB BY MOUTH EVERY 8 HRS FOR HEADACHE OR MODERATE PAIN. TAKE TWICE DAILY X2WKS, THEN AS NEEDED 60 tablet 0  . linaclotide (LINZESS) 290 MCG CAPS capsule TAKE 1 CAPSULE BY MOUTH  DAILY BEFORE BREAKFAST 90 capsule 3  . lovastatin (MEVACOR) 40 MG tablet Take 1 tablet (40 mg total) by mouth at bedtime. 90 tablet 3  . Multiple Vitamins-Minerals (OCUVITE PO) Take 2 tablets by mouth daily.    Marland Kitchen omeprazole (PRILOSEC) 40 MG capsule TAKE 1 CAPSULE BY MOUTH  TWICE DAILY BEFORE MEALS 180 capsule 3  . oxybutynin (DITROPAN-XL) 5 MG 24 hr tablet Take 1 tablet by mouth daily.    . polyethylene glycol (MIRALAX) packet Take 17 g by mouth daily. (Patient taking differently: Take 17 g by mouth as  needed. ) 14 each 0  . Probiotic Product (ALIGN) 4 MG CAPS Take 1 capsule (4 mg total) by mouth daily. 30 capsule 1  . SUMAtriptan (IMITREX) 100 MG tablet TAKE 1 TABLET BY MOUTH EVERY 2 HOURS AS NEEDED FOR MIGRAINE. MAY REPEAT IN 2 HOURS IF HEADACHE PERSISTS OR RECURS. 12 tablet 5  . verapamil (CALAN-SR) 240 MG CR tablet Take 1 tablet (240 mg total) by mouth at bedtime. 90 tablet 3  . VESICARE 10 MG tablet Take 10 mg by mouth daily.     . vitamin B-12 (CYANOCOBALAMIN) 1000 MCG tablet Take 500 mcg by mouth daily.     . traZODone (DESYREL) 50 MG tablet Take 1 tablet (50 mg total) by mouth at bedtime. 90 tablet 3   No facility-administered medications prior to visit.    ROS: Review of Systems  Constitutional: Negative for activity change, appetite change, chills, fatigue and unexpected weight change.  HENT: Negative for congestion, mouth sores and sinus pressure.   Eyes: Negative for visual disturbance.  Respiratory: Negative for cough and chest tightness.   Gastrointestinal: Negative for abdominal pain and nausea.  Genitourinary: Negative for difficulty urinating, frequency and vaginal pain.  Musculoskeletal: Positive for arthralgias. Negative  for back pain and gait problem.  Skin: Negative for pallor and rash.  Neurological: Negative for dizziness, tremors, weakness, numbness and headaches.  Psychiatric/Behavioral: Negative for confusion and sleep disturbance. The patient is nervous/anxious.     Objective:  BP (!) 148/84 (BP Location: Left Arm, Patient Position: Sitting, Cuff Size: Normal)   Pulse 68   Temp 98.7 F (37.1 C) (Oral)   Ht 5' (1.524 m)   Wt 203 lb (92.1 kg)   SpO2 98%   BMI 39.65 kg/m   BP Readings from Last 3 Encounters:  06/08/19 (!) 148/84  02/10/19 (!) 161/70  02/04/19 128/80    Wt Readings from Last 3 Encounters:  06/08/19 203 lb (92.1 kg)  02/10/19 195 lb (88.5 kg)  02/04/19 204 lb (92.5 kg)    Physical Exam Constitutional:      General: She is not  in acute distress.    Appearance: She is well-developed. She is obese.  HENT:     Head: Normocephalic.     Right Ear: External ear normal.     Left Ear: External ear normal.     Nose: Nose normal.  Eyes:     General:        Right eye: No discharge.        Left eye: No discharge.     Conjunctiva/sclera: Conjunctivae normal.     Pupils: Pupils are equal, round, and reactive to light.  Neck:     Thyroid: No thyromegaly.     Vascular: No JVD.     Trachea: No tracheal deviation.  Cardiovascular:     Rate and Rhythm: Normal rate and regular rhythm.     Heart sounds: Normal heart sounds.  Pulmonary:     Effort: No respiratory distress.     Breath sounds: No stridor. No wheezing.  Abdominal:     General: Bowel sounds are normal. There is no distension.     Palpations: Abdomen is soft. There is no mass.     Tenderness: There is no abdominal tenderness. There is no guarding or rebound.  Musculoskeletal:        General: No tenderness.     Cervical back: Normal range of motion and neck supple.  Lymphadenopathy:     Cervical: No cervical adenopathy.  Skin:    Findings: No erythema or rash.  Neurological:     Mental Status: She is oriented to person, place, and time.     Cranial Nerves: No cranial nerve deficit.     Motor: No abnormal muscle tone.     Coordination: Coordination normal.     Deep Tendon Reflexes: Reflexes normal.  Psychiatric:        Behavior: Behavior normal.        Thought Content: Thought content normal.        Judgment: Judgment normal.    R cheek 5 mm verucca   Procedure Note :     Procedure : Cryosurgery   Indication:  Wart(s)  x1   Risks including unsuccessful procedure , bleeding, infection, bruising, scar, a need for a repeat  procedure and others were explained to the patient in detail as well as the benefits. Informed consent was obtained verbally.    1 lesion(s)  on  R cheek  was/were treated with liquid nitrogen on a Q-tip in a usual fasion .  Band-Aid was applied and antibiotic ointment was given for a later use.   Tolerated well. Complications none.   Postprocedure instructions :  Keep the wounds clean. You can wash them with liquid soap and water. Pat dry with gauze or a Kleenex tissue  Before applying antibiotic ointment and a Band-Aid.   You need to report immediately  if  any signs of infection develop.     Lab Results  Component Value Date   WBC 8.1 02/04/2019   HGB 12.2 02/04/2019   HCT 37.1 02/04/2019   PLT 260.0 02/04/2019   GLUCOSE 93 02/04/2019   CHOL 184 02/04/2019   TRIG 217.0 (H) 02/04/2019   HDL 41.30 02/04/2019   LDLDIRECT 111.0 02/04/2019   LDLCALC 102 (H) 01/16/2017   ALT 13 02/04/2019   AST 16 02/04/2019   NA 141 02/04/2019   K 4.2 02/04/2019   CL 103 02/04/2019   CREATININE 0.84 02/04/2019   BUN 11 02/04/2019   CO2 30 02/04/2019   TSH 1.54 02/04/2019   INR 0.99 01/26/2017    MM 3D SCREEN BREAST BILATERAL  Result Date: 12/25/2018 CLINICAL DATA:  Screening. EXAM: DIGITAL SCREENING BILATERAL MAMMOGRAM WITH TOMO AND CAD COMPARISON:  Previous exam(s). ACR Breast Density Category b: There are scattered areas of fibroglandular density. FINDINGS: There are no findings suspicious for malignancy. Images were processed with CAD. IMPRESSION: No mammographic evidence of malignancy. A result letter of this screening mammogram will be mailed directly to the patient. RECOMMENDATION: Screening mammogram in one year. (Code:SM-B-01Y) BI-RADS CATEGORY  1: Negative. Electronically Signed   By: Everlean Alstrom [name]   On: 12/25/2018 16:56    Assessment & Plan:   There are no diagnoses linked to this encounter.   No orders of the defined types were placed in this encounter.    Follow-up: No follow-ups on file.  Walker Kehr, MD

## 2019-06-08 NOTE — Assessment & Plan Note (Signed)
Vit D 

## 2019-06-08 NOTE — Assessment & Plan Note (Signed)
Xanax prn  Potential benefits of a long term benzodiazepines  use as well as potential risks  and complications were explained to the patient and were aknowledged. 

## 2019-06-08 NOTE — Assessment & Plan Note (Signed)
R cheek - see cryo

## 2019-06-08 NOTE — Assessment & Plan Note (Signed)
Verapamil Check BP at home. Call if elevated

## 2019-06-08 NOTE — Assessment & Plan Note (Signed)
On B12 

## 2019-06-29 ENCOUNTER — Other Ambulatory Visit: Payer: Self-pay | Admitting: Internal Medicine

## 2019-07-23 ENCOUNTER — Other Ambulatory Visit: Payer: Self-pay | Admitting: Internal Medicine

## 2019-08-02 ENCOUNTER — Ambulatory Visit: Payer: Medicare PPO | Attending: Internal Medicine

## 2019-08-02 DIAGNOSIS — Z23 Encounter for immunization: Secondary | ICD-10-CM

## 2019-08-02 NOTE — Progress Notes (Signed)
   Covid-19 Vaccination Clinic  Name:  ARDATH LYKKEN    MRN: [id number] DOB: 09-Jul-1945  08/02/2019  Ms. Jech was observed post Covid-19 immunization for 15 minutes without incidence. She was provided with Vaccine Information Sheet and instruction to access the V-Safe system.   Ms. Romanoski was instructed to call 911 with any severe reactions post vaccine: Marland Kitchen Difficulty breathing  . Swelling of your face and throat  . A fast heartbeat  . A bad rash all over your body  . Dizziness and weakness    Immunizations Administered    Name Date Dose VIS Date Route   Pfizer COVID-19 Vaccine 08/02/2019  9:20 AM 0.3 mL 05/29/2019 Intramuscular   Manufacturer: [place]   Lot: [id number]   [place]: [id number]

## 2019-08-15 ENCOUNTER — Other Ambulatory Visit: Payer: Self-pay | Admitting: Internal Medicine

## 2019-08-24 ENCOUNTER — Ambulatory Visit: Payer: Medicare PPO | Attending: Internal Medicine

## 2019-08-24 DIAGNOSIS — Z23 Encounter for immunization: Secondary | ICD-10-CM | POA: Insufficient documentation

## 2019-08-24 NOTE — Progress Notes (Signed)
   Covid-19 Vaccination Clinic  Name:  [name]    MRN: [id number] DOB: Jul 13, 1945  08/24/2019  [name] was observed post Covid-19 immunization for 30 minutes based on pre-vaccination screening without incident. She was provided with Vaccine Information Sheet and instruction to access the V-Safe system.   [name] was instructed to call 911 with any severe reactions post vaccine: Marland Kitchen Difficulty breathing  . Swelling of face and throat  . A fast heartbeat  . A bad rash all over body  . Dizziness and weakness   Immunizations Administered    Name Date Dose VIS Date Route   Pfizer COVID-19 Vaccine 08/24/2019  6:29 PM 0.3 mL 05/29/2019 Intramuscular   Manufacturer: [place]   Lot: [id number]   [place]: [id number]

## 2019-09-02 DIAGNOSIS — R519 Headache, unspecified: Secondary | ICD-10-CM | POA: Diagnosis not present

## 2019-09-02 DIAGNOSIS — H43813 Vitreous degeneration, bilateral: Secondary | ICD-10-CM | POA: Diagnosis not present

## 2019-09-02 DIAGNOSIS — H353131 Nonexudative age-related macular degeneration, bilateral, early dry stage: Secondary | ICD-10-CM | POA: Diagnosis not present

## 2019-09-02 DIAGNOSIS — Z961 Presence of intraocular lens: Secondary | ICD-10-CM | POA: Diagnosis not present

## 2019-09-15 ENCOUNTER — Other Ambulatory Visit: Payer: Self-pay | Admitting: Internal Medicine

## 2019-10-07 ENCOUNTER — Other Ambulatory Visit: Payer: Self-pay

## 2019-10-07 ENCOUNTER — Encounter: Payer: Self-pay | Admitting: Internal Medicine

## 2019-10-07 ENCOUNTER — Ambulatory Visit: Payer: Medicare PPO | Admitting: Internal Medicine

## 2019-10-07 DIAGNOSIS — E785 Hyperlipidemia, unspecified: Secondary | ICD-10-CM

## 2019-10-07 DIAGNOSIS — I10 Essential (primary) hypertension: Secondary | ICD-10-CM

## 2019-10-07 DIAGNOSIS — E559 Vitamin D deficiency, unspecified: Secondary | ICD-10-CM | POA: Diagnosis not present

## 2019-10-07 DIAGNOSIS — E538 Deficiency of other specified B group vitamins: Secondary | ICD-10-CM | POA: Diagnosis not present

## 2019-10-07 MED ORDER — ALPRAZOLAM 0.5 MG PO TABS: 0.5000 mg | ORAL_TABLET | Freq: Two times a day (BID) | ORAL | 3 refills | Status: DC | PRN

## 2019-10-07 MED ORDER — LOVASTATIN 40 MG PO TABS: 40.0000 mg | ORAL_TABLET | Freq: Every day | ORAL | 3 refills | Status: DC

## 2019-10-07 MED ORDER — GABAPENTIN 100 MG PO CAPS: 100.0000 mg | ORAL_CAPSULE | Freq: Every day | ORAL | 3 refills | Status: DC

## 2019-10-07 MED ORDER — OMEPRAZOLE 40 MG PO CPDR: DELAYED_RELEASE_CAPSULE | ORAL | 3 refills | Status: DC

## 2019-10-07 MED ORDER — VERAPAMIL HCL ER 240 MG PO TBCR: 240.0000 mg | EXTENDED_RELEASE_TABLET | Freq: Every day | ORAL | 3 refills | Status: DC

## 2019-10-07 MED ORDER — LINACLOTIDE 290 MCG PO CAPS: ORAL_CAPSULE | ORAL | 3 refills | Status: DC

## 2019-10-07 MED ORDER — FLUTICASONE PROPIONATE 50 MCG/ACT NA SUSP: 2.0000 | Freq: Every day | NASAL | 3 refills | Status: AC

## 2019-10-07 MED ORDER — BUPROPION HCL ER (XL) 150 MG PO TB24: 150.0000 mg | ORAL_TABLET | Freq: Every day | ORAL | 3 refills | Status: DC

## 2019-10-07 NOTE — Patient Instructions (Signed)

## 2019-10-07 NOTE — Progress Notes (Signed)
Subjective:  Patient ID: [name], female    DOB: December 06, 1945  Age: 74 y.o. MRN: [id number]  CC: No chief complaint on file.   HPI MCKAYLIE TROM presents for anxiety, dyslipidemia and HTN f/u  Outpatient Medications Prior to Visit  Medication Sig Dispense Refill  . ALPRAZolam (XANAX) 0.5 MG tablet Take 1 tablet (0.5 mg total) by mouth 2 (two) times daily as needed. 60 tablet 3  . aspirin 81 MG tablet Take 81 mg by mouth daily.      Marland Kitchen buPROPion (WELLBUTRIN XL) 150 MG 24 hr tablet Take 1 tablet (150 mg total) by mouth daily. 90 tablet 3  . Cholecalciferol (VITAMIN D) 2000 UNITS CAPS Take 1 capsule by mouth daily.      . fish oil-omega-3 fatty acids 1000 MG capsule Take by mouth daily. Take  600 mg daily    . flurbiprofen (ANSAID) 100 MG tablet Take 1 tablet earliest onset of migraine. May take every 8 hours as needed, maximum 300mg  in 24h 30 tablet 3  . fluticasone (FLONASE) 50 MCG/ACT nasal spray Place 2 sprays into both nostrils daily. 48 g 3  . gabapentin (NEURONTIN) 100 MG capsule Take 1 capsule (100 mg total) by mouth at bedtime. 90 capsule 3  . ibuprofen (ADVIL) 600 MG tablet TAKE 1 TAB BY MOUTH EVERY 8 HRS FOR HEADACHE OR MODERATE PAIN. TAKE 2X DAILY X 2WKS THEN AS NEEDED 60 tablet 0  . linaclotide (LINZESS) 290 MCG CAPS capsule TAKE 1 CAPSULE BY MOUTH  DAILY BEFORE BREAKFAST 90 capsule 3  . lovastatin (MEVACOR) 40 MG tablet Take 1 tablet (40 mg total) by mouth at bedtime. 90 tablet 3  . Multiple Vitamins-Minerals (OCUVITE PO) Take 2 tablets by mouth daily.    Marland Kitchen omeprazole (PRILOSEC) 40 MG capsule TAKE 1 CAPSULE BY MOUTH  TWICE DAILY BEFORE MEALS 180 capsule 3  . oxybutynin (DITROPAN-XL) 5 MG 24 hr tablet Take 1 tablet by mouth daily.    . polyethylene glycol (MIRALAX) packet Take 17 g by mouth daily. (Patient taking differently: Take 17 g by mouth as needed. ) 14 each 0  . Probiotic Product (ALIGN) 4 MG CAPS Take 1 capsule (4 mg total) by mouth daily. 30 capsule 1  . SUMAtriptan  (IMITREX) 100 MG tablet TAKE 1 TABLET BY MOUTH EVERY 2 HOURS AS NEEDED FOR MIGRAINE. MAY REPEAT IN 2 HOURS IF HEADACHE PERSISTS OR RECURS. 12 tablet 5  . verapamil (CALAN-SR) 240 MG CR tablet Take 1 tablet (240 mg total) by mouth at bedtime. 90 tablet 3  . VESICARE 10 MG tablet Take 10 mg by mouth daily.     . vitamin B-12 (CYANOCOBALAMIN) 1000 MCG tablet Take 500 mcg by mouth daily.     . cefUROXime (CEFTIN) 250 MG tablet Take 1 tablet (250 mg total) by mouth 2 (two) times daily. 20 tablet 0  . traZODone (DESYREL) 50 MG tablet Take 1 tablet (50 mg total) by mouth at bedtime. 90 tablet 3   No facility-administered medications prior to visit.    ROS: Review of Systems  Constitutional: Negative for activity change, appetite change, chills, fatigue and unexpected weight change.  HENT: Negative for congestion, mouth sores and sinus pressure.   Eyes: Negative for visual disturbance.  Respiratory: Negative for cough and chest tightness.   Gastrointestinal: Negative for abdominal pain and nausea.  Genitourinary: Negative for difficulty urinating, frequency and vaginal pain.  Musculoskeletal: Positive for arthralgias. Negative for back pain and gait problem.  Skin:  Negative for pallor and rash.  Neurological: Negative for dizziness, tremors, weakness, numbness and headaches.  Psychiatric/Behavioral: Negative for confusion and sleep disturbance. The patient is nervous/anxious.     Objective:  BP 128/82 (BP Location: Left Arm, Patient Position: Sitting, Cuff Size: Large)   Pulse 84   Temp 98.5 F (36.9 C) (Oral)   Ht 5' (1.524 m)   Wt 199 lb (90.3 kg)   SpO2 95%   BMI 38.86 kg/m   BP Readings from Last 3 Encounters:  10/07/19 128/82  06/08/19 (!) 148/84  02/10/19 (!) 161/70    Wt Readings from Last 3 Encounters:  10/07/19 199 lb (90.3 kg)  06/08/19 203 lb (92.1 kg)  02/10/19 195 lb (88.5 kg)    Physical Exam Constitutional:      General: She is not in acute distress.     Appearance: She is well-developed.  HENT:     Head: Normocephalic.     Right Ear: External ear normal.     Left Ear: External ear normal.     Nose: Nose normal.  Eyes:     General:        Right eye: No discharge.        Left eye: No discharge.     Conjunctiva/sclera: Conjunctivae normal.     Pupils: Pupils are equal, round, and reactive to light.  Neck:     Thyroid: No thyromegaly.     Vascular: No JVD.     Trachea: No tracheal deviation.  Cardiovascular:     Rate and Rhythm: Normal rate and regular rhythm.     Heart sounds: Normal heart sounds.  Pulmonary:     Effort: No respiratory distress.     Breath sounds: No stridor. No wheezing.  Abdominal:     General: Bowel sounds are normal. There is no distension.     Palpations: Abdomen is soft. There is no mass.     Tenderness: There is no abdominal tenderness. There is no guarding or rebound.  Musculoskeletal:        General: No tenderness.     Cervical back: Normal range of motion and neck supple.  Lymphadenopathy:     Cervical: No cervical adenopathy.  Skin:    Findings: No erythema or rash.  Neurological:     Cranial Nerves: No cranial nerve deficit.     Motor: No abnormal muscle tone.     Coordination: Coordination normal.     Deep Tendon Reflexes: Reflexes normal.  Psychiatric:        Behavior: Behavior normal.        Thought Content: Thought content normal.        Judgment: Judgment normal.     Lab Results  Component Value Date   WBC 8.1 02/04/2019   HGB 12.2 02/04/2019   HCT 37.1 02/04/2019   PLT 260.0 02/04/2019   GLUCOSE 93 02/04/2019   CHOL 184 02/04/2019   TRIG 217.0 (H) 02/04/2019   HDL 41.30 02/04/2019   LDLDIRECT 111.0 02/04/2019   LDLCALC 102 (H) 01/16/2017   ALT 13 02/04/2019   AST 16 02/04/2019   NA 141 02/04/2019   K 4.2 02/04/2019   CL 103 02/04/2019   CREATININE 0.84 02/04/2019   BUN 11 02/04/2019   CO2 30 02/04/2019   TSH 1.54 02/04/2019   INR 0.99 01/26/2017    MM 3D SCREEN  BREAST BILATERAL  Result Date: 12/25/2018 CLINICAL DATA:  Screening. EXAM: DIGITAL SCREENING BILATERAL MAMMOGRAM WITH TOMO AND CAD COMPARISON:  Previous  exam(s). ACR Breast Density Category b: There are scattered areas of fibroglandular density. FINDINGS: There are no findings suspicious for malignancy. Images were processed with CAD. IMPRESSION: No mammographic evidence of malignancy. A result letter of this screening mammogram will be mailed directly to the patient. RECOMMENDATION: Screening mammogram in one year. (Code:SM-B-01Y) BI-RADS CATEGORY  1: Negative. Electronically Signed   By: Everlean Alstrom [name]   On: 12/25/2018 16:56    Assessment & Plan:    Walker Kehr, MD

## 2019-10-07 NOTE — Assessment & Plan Note (Signed)
Lovastatin 

## 2019-10-07 NOTE — Assessment & Plan Note (Signed)
Vit D 

## 2019-10-07 NOTE — Assessment & Plan Note (Signed)
Verapamil 

## 2019-10-07 NOTE — Assessment & Plan Note (Signed)
On B12 

## 2019-10-08 ENCOUNTER — Ambulatory Visit: Payer: Medicare Other | Admitting: Internal Medicine

## 2019-10-15 ENCOUNTER — Other Ambulatory Visit: Payer: Self-pay | Admitting: Internal Medicine

## 2019-10-16 ENCOUNTER — Telehealth: Payer: Self-pay | Admitting: Internal Medicine

## 2019-10-16 NOTE — Telephone Encounter (Signed)
    [employer] calling to verify patient should be taking both verapamil (CALAN-SR) 240 MG CR tablet and  lovastatin (MEVACOR) 40 MG tablet  Please call [phone] Ref # [id number]

## 2019-10-16 NOTE — Telephone Encounter (Signed)
Verified with pharmacy that pt is taking both medications

## 2019-10-21 ENCOUNTER — Ambulatory Visit (INDEPENDENT_AMBULATORY_CARE_PROVIDER_SITE_OTHER)
Admission: RE | Admit: 2019-10-21 | Discharge: 2019-10-21 | Disposition: A | Discharge status: Home / Self Care | Payer: Self-pay | Source: Ambulatory Visit | Attending: Internal Medicine | Admitting: Internal Medicine

## 2019-10-21 ENCOUNTER — Other Ambulatory Visit: Payer: Self-pay

## 2019-10-21 DIAGNOSIS — R918 Other nonspecific abnormal finding of lung field: Secondary | ICD-10-CM | POA: Diagnosis not present

## 2019-10-21 DIAGNOSIS — E785 Hyperlipidemia, unspecified: Secondary | ICD-10-CM

## 2019-10-21 DIAGNOSIS — I700 Atherosclerosis of aorta: Secondary | ICD-10-CM | POA: Diagnosis not present

## 2019-11-19 ENCOUNTER — Telehealth (INDEPENDENT_AMBULATORY_CARE_PROVIDER_SITE_OTHER): Payer: Medicare PPO | Admitting: Internal Medicine

## 2019-11-19 DIAGNOSIS — J040 Acute laryngitis: Secondary | ICD-10-CM | POA: Diagnosis not present

## 2019-11-19 DIAGNOSIS — J069 Acute upper respiratory infection, unspecified: Secondary | ICD-10-CM | POA: Diagnosis not present

## 2019-11-19 MED ORDER — AZITHROMYCIN 250 MG PO TABS: ORAL_TABLET | ORAL | 0 refills | Status: DC

## 2019-11-19 MED ORDER — METHYLPREDNISOLONE 4 MG PO TBPK: ORAL_TABLET | ORAL | 0 refills | Status: DC

## 2019-11-19 MED ORDER — PROMETHAZINE-CODEINE 6.25-10 MG/5ML PO SYRP: 5.0000 mL | ORAL_SOLUTION | ORAL | 0 refills | Status: DC | PRN

## 2019-11-19 NOTE — Assessment & Plan Note (Signed)
Prom-cod syrup prn cough

## 2019-11-19 NOTE — Progress Notes (Signed)
Virtual Visit via Video Note  I connected with [name] on 11/19/19 at  3:20 PM EDT by a video enabled telemedicine application and verified that I am speaking with the correct person using two identifiers.   I discussed the limitations of evaluation and management by telemedicine and the availability of in person appointments. The patient expressed understanding and agreed to proceed.  I was located at our [facility] office. The patient was at home. There was no one else present in the visit.   History of Present Illness: C/o voice loss, severe cough x 6 days - worse  There has been no runny nose,shortness of breath, abdominal pain, diarrhea, constipation,  skin rashes.   Observations/Objective: The patient appears to be in no acute distress, looks ok. Hoarse.  Assessment and Plan:  See my Assessment and Plan. Follow Up Instructions:    I discussed the assessment and treatment plan with the patient. The patient was provided an opportunity to ask questions and all were answered. The patient agreed with the plan and demonstrated an understanding of the instructions.   The patient was advised to call back or seek an in-person evaluation if the symptoms worsen or if the condition fails to improve as anticipated.  I provided face-to-face time during this encounter. We were at different locations.   Walker Kehr, MD

## 2019-11-19 NOTE — Assessment & Plan Note (Signed)
Medrol pac, Zpac ENT if not better

## 2019-12-24 ENCOUNTER — Other Ambulatory Visit: Payer: Self-pay | Admitting: Internal Medicine

## 2019-12-24 DIAGNOSIS — F5101 Primary insomnia: Secondary | ICD-10-CM

## 2020-02-18 ENCOUNTER — Other Ambulatory Visit: Payer: Self-pay | Admitting: Gynecology

## 2020-02-18 DIAGNOSIS — Z1231 Encounter for screening mammogram for malignant neoplasm of breast: Secondary | ICD-10-CM

## 2020-03-08 DIAGNOSIS — L820 Inflamed seborrheic keratosis: Secondary | ICD-10-CM | POA: Diagnosis not present

## 2020-03-08 DIAGNOSIS — L738 Other specified follicular disorders: Secondary | ICD-10-CM | POA: Diagnosis not present

## 2020-03-08 DIAGNOSIS — D485 Neoplasm of uncertain behavior of skin: Secondary | ICD-10-CM | POA: Diagnosis not present

## 2020-03-08 DIAGNOSIS — Z85828 Personal history of other malignant neoplasm of skin: Secondary | ICD-10-CM | POA: Diagnosis not present

## 2020-03-08 DIAGNOSIS — D2261 Melanocytic nevi of right upper limb, including shoulder: Secondary | ICD-10-CM | POA: Diagnosis not present

## 2020-03-08 DIAGNOSIS — L821 Other seborrheic keratosis: Secondary | ICD-10-CM | POA: Diagnosis not present

## 2020-03-08 DIAGNOSIS — L304 Erythema intertrigo: Secondary | ICD-10-CM | POA: Diagnosis not present

## 2020-03-17 ENCOUNTER — Ambulatory Visit
Admission: RE | Admit: 2020-03-17 | Discharge: 2020-03-17 | Disposition: A | Discharge status: Home / Self Care | Payer: Medicare PPO | Source: Ambulatory Visit | Attending: Gynecology | Admitting: Gynecology

## 2020-03-17 ENCOUNTER — Other Ambulatory Visit: Payer: Self-pay

## 2020-03-17 DIAGNOSIS — Z1231 Encounter for screening mammogram for malignant neoplasm of breast: Secondary | ICD-10-CM

## 2020-03-21 ENCOUNTER — Other Ambulatory Visit: Payer: Self-pay | Admitting: Gynecology

## 2020-03-21 DIAGNOSIS — R928 Other abnormal and inconclusive findings on diagnostic imaging of breast: Secondary | ICD-10-CM

## 2020-03-25 ENCOUNTER — Other Ambulatory Visit: Payer: Self-pay

## 2020-03-25 ENCOUNTER — Ambulatory Visit
Admission: RE | Admit: 2020-03-25 | Discharge: 2020-03-25 | Disposition: A | Discharge status: Home / Self Care | Payer: Medicare PPO | Source: Ambulatory Visit | Attending: Gynecology | Admitting: Gynecology

## 2020-03-25 ENCOUNTER — Ambulatory Visit: Payer: Medicare PPO

## 2020-03-25 DIAGNOSIS — R928 Other abnormal and inconclusive findings on diagnostic imaging of breast: Secondary | ICD-10-CM

## 2020-04-05 ENCOUNTER — Ambulatory Visit: Payer: Medicare PPO | Admitting: Internal Medicine

## 2020-04-08 ENCOUNTER — Ambulatory Visit (INDEPENDENT_AMBULATORY_CARE_PROVIDER_SITE_OTHER): Payer: Medicare PPO

## 2020-04-08 ENCOUNTER — Ambulatory Visit (INDEPENDENT_AMBULATORY_CARE_PROVIDER_SITE_OTHER): Payer: Medicare PPO | Admitting: Internal Medicine

## 2020-04-08 ENCOUNTER — Encounter: Payer: Self-pay | Admitting: Internal Medicine

## 2020-04-08 ENCOUNTER — Other Ambulatory Visit (INDEPENDENT_AMBULATORY_CARE_PROVIDER_SITE_OTHER): Payer: Medicare PPO

## 2020-04-08 ENCOUNTER — Other Ambulatory Visit: Payer: Self-pay

## 2020-04-08 VITALS — BP 116/68 | HR 84 | Temp 98.1°F | Ht 60.0 in | Wt 198.0 lb

## 2020-04-08 DIAGNOSIS — M79602 Pain in left arm: Secondary | ICD-10-CM

## 2020-04-08 DIAGNOSIS — E538 Deficiency of other specified B group vitamins: Secondary | ICD-10-CM

## 2020-04-08 DIAGNOSIS — E559 Vitamin D deficiency, unspecified: Secondary | ICD-10-CM

## 2020-04-08 DIAGNOSIS — Z23 Encounter for immunization: Secondary | ICD-10-CM | POA: Diagnosis not present

## 2020-04-08 DIAGNOSIS — M47812 Spondylosis without myelopathy or radiculopathy, cervical region: Secondary | ICD-10-CM | POA: Diagnosis not present

## 2020-04-08 DIAGNOSIS — I10 Essential (primary) hypertension: Secondary | ICD-10-CM

## 2020-04-08 DIAGNOSIS — R931 Abnormal findings on diagnostic imaging of heart and coronary circulation: Secondary | ICD-10-CM | POA: Insufficient documentation

## 2020-04-08 DIAGNOSIS — Z801 Family history of malignant neoplasm of trachea, bronchus and lung: Secondary | ICD-10-CM | POA: Diagnosis not present

## 2020-04-08 DIAGNOSIS — I2583 Coronary atherosclerosis due to lipid rich plaque: Secondary | ICD-10-CM | POA: Diagnosis not present

## 2020-04-08 DIAGNOSIS — M25512 Pain in left shoulder: Secondary | ICD-10-CM | POA: Insufficient documentation

## 2020-04-08 DIAGNOSIS — E785 Hyperlipidemia, unspecified: Secondary | ICD-10-CM | POA: Diagnosis not present

## 2020-04-08 DIAGNOSIS — I2510 Atherosclerotic heart disease of native coronary artery without angina pectoris: Secondary | ICD-10-CM | POA: Diagnosis not present

## 2020-04-08 MED ORDER — METHYLPREDNISOLONE 4 MG PO TBPK
ORAL_TABLET | ORAL | 0 refills | Status: DC
Start: 2020-04-08 — End: 2020-05-24

## 2020-04-08 MED ORDER — SOLIFENACIN SUCCINATE 10 MG PO TABS
10.0000 mg | ORAL_TABLET | Freq: Every day | ORAL | 3 refills | Status: DC
Start: 2020-04-08 — End: 2020-05-25

## 2020-04-08 NOTE — Assessment & Plan Note (Signed)
On B12 

## 2020-04-08 NOTE — Assessment & Plan Note (Signed)
Medrol C spine X ray

## 2020-04-08 NOTE — Assessment & Plan Note (Signed)
Vit D 

## 2020-04-08 NOTE — Progress Notes (Signed)
Subjective:  Patient ID: [name], female    DOB: 10-23-1945  Age: 74 y.o. MRN: [id number]  CC: Follow-up   HPI SHADIAMOND KOSKA presents for CAD, pulm nodule C/o L arm pain and weakness x months; worse. No exertional sx's  Outpatient Medications Prior to Visit  Medication Sig Dispense Refill  . ALPRAZolam (XANAX) 0.5 MG tablet Take 1 tablet (0.5 mg total) by mouth 2 (two) times daily as needed. 60 tablet 3  . aspirin 81 MG tablet Take 81 mg by mouth daily.      Marland Kitchen buPROPion (WELLBUTRIN XL) 150 MG 24 hr tablet Take 1 tablet (150 mg total) by mouth daily. 90 tablet 3  . Cholecalciferol (VITAMIN D) 2000 UNITS CAPS Take 1 capsule by mouth daily.      . fish oil-omega-3 fatty acids 1000 MG capsule Take by mouth daily. Take  600 mg daily    . flurbiprofen (ANSAID) 100 MG tablet Take 1 tablet earliest onset of migraine. May take every 8 hours as needed, maximum 300mg  in 24h 30 tablet 3  . fluticasone (FLONASE) 50 MCG/ACT nasal spray Place 2 sprays into both nostrils daily. 48 g 3  . gabapentin (NEURONTIN) 100 MG capsule Take 1 capsule (100 mg total) by mouth at bedtime. 90 capsule 3  . ibuprofen (ADVIL) 600 MG tablet TAKE 1 TAB BY MOUTH EVERY 8 HRS FOR HEADACHE OR MODERATE PAIN. TAKE 2X DAILY X 2WKS THEN AS NEEDED 60 tablet 0  . linaclotide (LINZESS) 290 MCG CAPS capsule TAKE 1 CAPSULE BY MOUTH  DAILY BEFORE BREAKFAST 90 capsule 3  . lovastatin (MEVACOR) 40 MG tablet Take 1 tablet (40 mg total) by mouth at bedtime. 90 tablet 3  . methylPREDNISolone (MEDROL DOSEPAK) 4 MG TBPK tablet As directed 21 tablet 0  . Multiple Vitamins-Minerals (OCUVITE PO) Take 2 tablets by mouth daily.    Marland Kitchen omeprazole (PRILOSEC) 40 MG capsule TAKE 1 CAPSULE BY MOUTH  TWICE DAILY BEFORE MEALS 180 capsule 3  . oxybutynin (DITROPAN-XL) 5 MG 24 hr tablet Take 1 tablet by mouth daily.    . polyethylene glycol (MIRALAX) packet Take 17 g by mouth daily. (Patient taking differently: Take 17 g by mouth as needed. ) 14 each 0   . Probiotic Product (ALIGN) 4 MG CAPS Take 1 capsule (4 mg total) by mouth daily. 30 capsule 1  . promethazine-codeine (PHENERGAN WITH CODEINE) 6.25-10 MG/5ML syrup Take 5 mLs by mouth every 4 (four) hours as needed. 300 mL 0  . SUMAtriptan (IMITREX) 100 MG tablet TAKE 1 TABLET BY MOUTH EVERY 2 HOURS AS NEEDED FOR MIGRAINE. MAY REPEAT IN 2 HOURS IF HEADACHE PERSISTS OR RECURS. 12 tablet 5  . traZODone (DESYREL) 50 MG tablet TAKE 1 TABLET BY MOUTH AT  BEDTIME 90 tablet 3  . verapamil (CALAN-SR) 240 MG CR tablet Take 1 tablet (240 mg total) by mouth at bedtime. 90 tablet 3  . VESICARE 10 MG tablet Take 10 mg by mouth daily.     . vitamin B-12 (CYANOCOBALAMIN) 1000 MCG tablet Take 500 mcg by mouth daily.     Marland Kitchen azithromycin (ZITHROMAX Z-PAK) 250 MG tablet As directed (Patient not taking: Reported on 04/08/2020) 6 tablet 0   No facility-administered medications prior to visit.    ROS: Review of Systems  Constitutional: Negative for activity change, appetite change, chills, fatigue and unexpected weight change.  HENT: Negative for congestion, mouth sores and sinus pressure.   Eyes: Negative for visual disturbance.  Respiratory: Negative  for cough, chest tightness and shortness of breath.   Cardiovascular: Negative for chest pain and leg swelling.  Gastrointestinal: Negative for abdominal pain and nausea.  Genitourinary: Negative for difficulty urinating, frequency and vaginal pain.  Musculoskeletal: Positive for arthralgias and neck stiffness. Negative for back pain and gait problem.  Skin: Negative for pallor and rash.  Neurological: Positive for weakness. Negative for dizziness, tremors, numbness and headaches.  Psychiatric/Behavioral: Negative for confusion and sleep disturbance.    Objective:  BP 116/68 (BP Location: Right Arm, Patient Position: Sitting, Cuff Size: Large)   Pulse 84   Temp 98.1 F (36.7 C) (Oral)   Ht 5' (1.524 m)   Wt 198 lb (89.8 kg)   SpO2 96%   BMI 38.67 kg/m    BP Readings from Last 3 Encounters:  04/08/20 116/68  10/07/19 128/82  06/08/19 (!) 148/84    Wt Readings from Last 3 Encounters:  04/08/20 198 lb (89.8 kg)  10/07/19 199 lb (90.3 kg)  06/08/19 203 lb (92.1 kg)    Physical Exam Constitutional:      General: She is not in acute distress.    Appearance: She is well-developed.  HENT:     Head: Normocephalic.     Right Ear: External ear normal.     Left Ear: External ear normal.     Nose: Nose normal.  Eyes:     General:        Right eye: No discharge.        Left eye: No discharge.     Conjunctiva/sclera: Conjunctivae normal.     Pupils: Pupils are equal, round, and reactive to light.  Neck:     Thyroid: No thyromegaly.     Vascular: No JVD.     Trachea: No tracheal deviation.  Cardiovascular:     Rate and Rhythm: Normal rate and regular rhythm.     Heart sounds: Normal heart sounds.  Pulmonary:     Effort: No respiratory distress.     Breath sounds: No stridor. No wheezing.  Abdominal:     General: Bowel sounds are normal. There is no distension.     Palpations: Abdomen is soft. There is no mass.     Tenderness: There is no abdominal tenderness. There is no guarding or rebound.  Musculoskeletal:        General: No tenderness.     Cervical back: Normal range of motion and neck supple.     Right lower leg: No edema.     Left lower leg: No edema.  Lymphadenopathy:     Cervical: No cervical adenopathy.  Skin:    Findings: No erythema or rash.  Neurological:     Cranial Nerves: No cranial nerve deficit.     Motor: No weakness or abnormal muscle tone.     Coordination: Coordination normal.     Deep Tendon Reflexes: Reflexes normal.  Psychiatric:        Behavior: Behavior normal.        Thought Content: Thought content normal.        Judgment: Judgment normal.     Lab Results  Component Value Date   WBC 8.1 02/04/2019   HGB 12.2 02/04/2019   HCT 37.1 02/04/2019   PLT 260.0 02/04/2019   GLUCOSE 93  02/04/2019   CHOL 184 02/04/2019   TRIG 217.0 (H) 02/04/2019   HDL 41.30 02/04/2019   LDLDIRECT 111.0 02/04/2019   LDLCALC 102 (H) 01/16/2017   ALT 13 02/04/2019   AST 16  02/04/2019   NA 141 02/04/2019   K 4.2 02/04/2019   CL 103 02/04/2019   CREATININE 0.84 02/04/2019   BUN 11 02/04/2019   CO2 30 02/04/2019   TSH 1.54 02/04/2019   INR 0.99 01/26/2017    MM DIAG BREAST TOMO UNI RIGHT  Result Date: 03/25/2020 CLINICAL DATA:  Possible asymmetry in the central right breast on a recent screening mammogram in the craniocaudal projection. EXAM: DIGITAL DIAGNOSTIC UNILATERAL RIGHT MAMMOGRAM WITH TOMO AND CAD COMPARISON:  Previous exam(s). ACR Breast Density Category b: There are scattered areas of fibroglandular density. FINDINGS: 3D tomographic and 2D generated true lateral and spot compression craniocaudal views of the right breast demonstrate normal appearing fibroglandular tissue at the location of recently suspected asymmetry, unchanged compared to previous examinations. Mammographic images were processed with CAD. IMPRESSION: No evidence of malignancy. The recently suspected right breast asymmetry was close apposition of normal breast tissue. RECOMMENDATION: Bilateral screening mammogram in 1 year. I have discussed the findings and recommendations with the patient. If applicable, a reminder letter will be sent to the patient regarding the next appointment. BI-RADS CATEGORY  1: Negative. Electronically Signed   By: Claudie Revering [name]   On: 03/25/2020 08:55    Assessment & Plan:    Walker Kehr, MD

## 2020-04-08 NOTE — Assessment & Plan Note (Addendum)
cardiac CT scan for calcium scoring offered - CT score 164 Lovastatin  ASA Cardiol ref was offered

## 2020-04-08 NOTE — Assessment & Plan Note (Signed)
CardiacCT score 164 Lovastatin  ASA

## 2020-04-08 NOTE — Assessment & Plan Note (Signed)
3 mm Repeat chest CT in 12 mo

## 2020-04-08 NOTE — Addendum Note (Signed)
Addended by: Cassandria Anger on: 04/08/2020 11:19 AM   Modules accepted: Orders

## 2020-05-24 ENCOUNTER — Ambulatory Visit (INDEPENDENT_AMBULATORY_CARE_PROVIDER_SITE_OTHER): Payer: Medicare PPO | Admitting: Internal Medicine

## 2020-05-24 ENCOUNTER — Encounter: Payer: Self-pay | Admitting: Internal Medicine

## 2020-05-24 ENCOUNTER — Other Ambulatory Visit: Payer: Self-pay

## 2020-05-24 DIAGNOSIS — E559 Vitamin D deficiency, unspecified: Secondary | ICD-10-CM

## 2020-05-24 DIAGNOSIS — E785 Hyperlipidemia, unspecified: Secondary | ICD-10-CM

## 2020-05-24 DIAGNOSIS — E538 Deficiency of other specified B group vitamins: Secondary | ICD-10-CM

## 2020-05-24 DIAGNOSIS — M25512 Pain in left shoulder: Secondary | ICD-10-CM

## 2020-05-24 DIAGNOSIS — I10 Essential (primary) hypertension: Secondary | ICD-10-CM | POA: Diagnosis not present

## 2020-05-24 DIAGNOSIS — G8929 Other chronic pain: Secondary | ICD-10-CM

## 2020-05-24 LAB — COMPREHENSIVE METABOLIC PANEL
ALT: 8 U/L (ref 0–35)
AST: 11 U/L (ref 0–37)
Albumin: 4.2 g/dL (ref 3.5–5.2)
Alkaline Phosphatase: 86 U/L (ref 39–117)
BUN: 13 mg/dL (ref 6–23)
CO2: 30 mEq/L (ref 19–32)
Calcium: 9.4 mg/dL (ref 8.4–10.5)
Chloride: 106 mEq/L (ref 96–112)
Creatinine, Ser: 0.8 mg/dL (ref 0.40–1.20)
GFR: 72.49 mL/min (ref >60.00)
Glucose, Bld: 94 mg/dL (ref 70–99)
Potassium: 4.3 mEq/L (ref 3.5–5.1)
Sodium: 143 mEq/L (ref 135–145)
Total Bilirubin: 0.6 mg/dL (ref 0.2–1.2)
Total Protein: 6.9 g/dL (ref 6.0–8.3)

## 2020-05-24 LAB — CBC WITH DIFFERENTIAL/PLATELET
Basophils Absolute: 0 10*3/uL (ref 0.0–0.1)
Basophils Relative: 0.6 % (ref 0.0–3.0)
Eosinophils Absolute: 0.2 10*3/uL (ref 0.0–0.7)
Eosinophils Relative: 2.5 % (ref 0.0–5.0)
HCT: 39.1 % (ref 36.0–46.0)
Hemoglobin: 12.8 g/dL (ref 12.0–15.0)
Lymphocytes Relative: 32.6 % (ref 12.0–46.0)
Lymphs Abs: 2.6 10*3/uL (ref 0.7–4.0)
MCHC: 32.9 g/dL (ref 30.0–36.0)
MCV: 89.5 fl (ref 78.0–100.0)
Monocytes Absolute: 0.6 10*3/uL (ref 0.1–1.0)
Monocytes Relative: 7.5 % (ref 3.0–12.0)
Neutro Abs: 4.5 10*3/uL (ref 1.4–7.7)
Neutrophils Relative %: 56.8 % (ref 43.0–77.0)
Platelets: 219 10*3/uL (ref 150.0–400.0)
RBC: 4.36 Mil/uL (ref 3.87–5.11)
RDW: 14.6 % (ref 11.5–15.5)
WBC: 8 10*3/uL (ref 4.0–10.5)

## 2020-05-24 LAB — URINALYSIS
Bilirubin Urine: NEGATIVE
Hgb urine dipstick: NEGATIVE
Ketones, ur: NEGATIVE
Leukocytes,Ua: NEGATIVE
Nitrite: NEGATIVE
Specific Gravity, Urine: 1.015 (ref 1.000–1.030)
Total Protein, Urine: NEGATIVE
Urine Glucose: NEGATIVE
Urobilinogen, UA: 1 (ref 0.0–1.0)
pH: 6.5 (ref 5.0–8.0)

## 2020-05-24 LAB — LIPID PANEL
Cholesterol: 174 mg/dL (ref 0–200)
HDL: 41.2 mg/dL (ref >39.00)
LDL Cholesterol: 107 mg/dL — ABNORMAL HIGH (ref 0–99)
NonHDL: 132.84
Total CHOL/HDL Ratio: 4
Triglycerides: 127 mg/dL (ref 0.0–149.0)
VLDL: 25.4 mg/dL (ref 0.0–40.0)

## 2020-05-24 LAB — TSH: TSH: 1.22 u[IU]/mL (ref 0.35–4.50)

## 2020-05-24 MED ORDER — METHYLPREDNISOLONE ACETATE 40 MG/ML IJ SUSP
40.0000 mg | Freq: Once | INTRAMUSCULAR | Status: AC
  Administered 2020-05-24: 40 mg via INTRAMUSCULAR

## 2020-05-24 MED ORDER — METHYLPREDNISOLONE 4 MG PO TBPK
ORAL_TABLET | ORAL | 0 refills | Status: DC
Start: 2020-05-24 — End: 2020-07-18

## 2020-05-24 MED ORDER — BUPROPION HCL ER (XL) 150 MG PO TB24: 150.0000 mg | ORAL_TABLET | Freq: Every day | ORAL | 3 refills | Status: DC

## 2020-05-24 MED ORDER — LIDOCAINE-EPINEPHRINE 2 %-1:100000 IJ SOLN
3.0000 mL | Freq: Once | INTRAMUSCULAR | Status: AC
  Administered 2020-05-24: 3 mL

## 2020-05-24 MED ORDER — ALPRAZOLAM 0.5 MG PO TABS: 0.5000 mg | ORAL_TABLET | Freq: Two times a day (BID) | ORAL | 3 refills | Status: DC | PRN

## 2020-05-24 NOTE — Assessment & Plan Note (Signed)
Not better Will inject w/steroids Medrol [facility] if not better

## 2020-05-24 NOTE — Assessment & Plan Note (Signed)
On Lovastatin

## 2020-05-24 NOTE — Assessment & Plan Note (Signed)
BP Readings from Last 3 Encounters:  05/24/20 138/82  04/08/20 116/68  10/07/19 128/82

## 2020-05-24 NOTE — Assessment & Plan Note (Signed)
On B12 

## 2020-05-24 NOTE — Progress Notes (Signed)
Subjective:  Patient ID: [name], female    DOB: [birth year]  Age: 74 y.o. MRN: [id number]  CC: Follow-up (6 week f/u)   HPI [name] presents for L arm pain - not better. Medrol helped some... F/u dyslipidemia, CAD  Outpatient Medications Prior to Visit  Medication Sig Dispense Refill  . ALPRAZolam (XANAX) 0.5 MG tablet Take 1 tablet (0.5 mg total) by mouth 2 (two) times daily as needed. 60 tablet 3  . aspirin 81 MG tablet Take 81 mg by mouth daily.      Marland Kitchen buPROPion (WELLBUTRIN XL) 150 MG 24 hr tablet Take 1 tablet (150 mg total) by mouth daily. 90 tablet 3  . Cholecalciferol (VITAMIN D) 2000 UNITS CAPS Take 1 capsule by mouth daily.      . fish oil-omega-3 fatty acids 1000 MG capsule Take by mouth daily. Take  600 mg daily    . flurbiprofen (ANSAID) 100 MG tablet Take 1 tablet earliest onset of migraine. May take every 8 hours as needed, maximum 300mg  in 24h 30 tablet 3  . fluticasone (FLONASE) 50 MCG/ACT nasal spray Place 2 sprays into both nostrils daily. 48 g 3  . gabapentin (NEURONTIN) 100 MG capsule Take 1 capsule (100 mg total) by mouth at bedtime. 90 capsule 3  . ibuprofen (ADVIL) 600 MG tablet TAKE 1 TAB BY MOUTH EVERY 8 HRS FOR HEADACHE OR MODERATE PAIN. TAKE 2X DAILY X 2WKS THEN AS NEEDED 60 tablet 0  . linaclotide (LINZESS) 290 MCG CAPS capsule TAKE 1 CAPSULE BY MOUTH  DAILY BEFORE BREAKFAST 90 capsule 3  . lovastatin (MEVACOR) 40 MG tablet Take 1 tablet (40 mg total) by mouth at bedtime. 90 tablet 3  . methylPREDNISolone (MEDROL DOSEPAK) 4 MG TBPK tablet As directed 21 tablet 0  . Multiple Vitamins-Minerals (OCUVITE PO) Take 2 tablets by mouth daily.    Marland Kitchen omeprazole (PRILOSEC) 40 MG capsule TAKE 1 CAPSULE BY MOUTH  TWICE DAILY BEFORE MEALS 180 capsule 3  . oxybutynin (DITROPAN-XL) 5 MG 24 hr tablet Take 1 tablet by mouth daily.    . polyethylene glycol (MIRALAX) packet Take 17 g by mouth daily. (Patient taking differently: Take 17 g by mouth as needed. ) 14 each 0   . Probiotic Product (ALIGN) 4 MG CAPS Take 1 capsule (4 mg total) by mouth daily. 30 capsule 1  . promethazine-codeine (PHENERGAN WITH CODEINE) 6.25-10 MG/5ML syrup Take 5 mLs by mouth every 4 (four) hours as needed. 300 mL 0  . solifenacin (VESICARE) 10 MG tablet Take 1 tablet (10 mg total) by mouth daily. 90 tablet 3  . SUMAtriptan (IMITREX) 100 MG tablet TAKE 1 TABLET BY MOUTH EVERY 2 HOURS AS NEEDED FOR MIGRAINE. MAY REPEAT IN 2 HOURS IF HEADACHE PERSISTS OR RECURS. 12 tablet 5  . traZODone (DESYREL) 50 MG tablet TAKE 1 TABLET BY MOUTH AT  BEDTIME 90 tablet 3  . verapamil (CALAN-SR) 240 MG CR tablet Take 1 tablet (240 mg total) by mouth at bedtime. 90 tablet 3  . vitamin B-12 (CYANOCOBALAMIN) 1000 MCG tablet Take 500 mcg by mouth daily.     Marland Kitchen azithromycin (ZITHROMAX Z-PAK) 250 MG tablet As directed (Patient not taking: Reported on 04/08/2020) 6 tablet 0   No facility-administered medications prior to visit.    ROS: Review of Systems  Constitutional: Negative for activity change, appetite change, chills, fatigue and unexpected weight change.  HENT: Negative for congestion, mouth sores and sinus pressure.   Eyes: Negative for visual  disturbance.  Respiratory: Negative for cough and chest tightness.   Gastrointestinal: Negative for abdominal pain and nausea.  Genitourinary: Negative for difficulty urinating, frequency and vaginal pain.  Musculoskeletal: Positive for arthralgias. Negative for back pain and gait problem.  Skin: Negative for pallor and rash.  Neurological: Negative for dizziness, tremors, weakness, numbness and headaches.  Psychiatric/Behavioral: Negative for confusion and sleep disturbance.    Objective:  BP 138/82 (BP Location: Left Arm)   Pulse 84   Temp 98 F (36.7 C) (Oral)   Wt 199 lb 9.6 oz (90.5 kg)   SpO2 96%   BMI 38.98 kg/m   BP Readings from Last 3 Encounters:  05/24/20 138/82  04/08/20 116/68  10/07/19 128/82    Wt Readings from Last 3  Encounters:  05/24/20 199 lb 9.6 oz (90.5 kg)  04/08/20 198 lb (89.8 kg)  10/07/19 199 lb (90.3 kg)    Physical Exam Constitutional:      General: She is not in acute distress.    Appearance: She is well-developed.  HENT:     Head: Normocephalic.     Right Ear: External ear normal.     Left Ear: External ear normal.     Nose: Nose normal.  Eyes:     General:        Right eye: No discharge.        Left eye: No discharge.     Conjunctiva/sclera: Conjunctivae normal.     Pupils: Pupils are equal, round, and reactive to light.  Neck:     Thyroid: No thyromegaly.     Vascular: No JVD.     Trachea: No tracheal deviation.  Cardiovascular:     Rate and Rhythm: Normal rate and regular rhythm.     Heart sounds: Normal heart sounds.  Pulmonary:     Effort: No respiratory distress.     Breath sounds: No stridor. No wheezing.  Abdominal:     General: Bowel sounds are normal. There is no distension.     Palpations: Abdomen is soft. There is no mass.     Tenderness: There is no abdominal tenderness. There is no guarding or rebound.  Musculoskeletal:        General: Tenderness present.     Cervical back: Normal range of motion and neck supple.  Lymphadenopathy:     Cervical: No cervical adenopathy.  Skin:    Findings: No erythema or rash.  Neurological:     Mental Status: She is oriented to person, place, and time.     Cranial Nerves: No cranial nerve deficit.     Motor: No abnormal muscle tone.     Coordination: Coordination normal.     Deep Tendon Reflexes: Reflexes normal.  Psychiatric:        Behavior: Behavior normal.        Thought Content: Thought content normal.        Judgment: Judgment normal.   L shoulder is painful w/ROM   Procedure :Joint Injection,  L shoulder   Indication:  Subacromial bursitis with refractory  chronic pain.   Risks including unsuccessful procedure , bleeding, infection, bruising, skin atrophy, "steroid flare-up" and others were explained to  the patient in detail as well as the benefits. Informed consent was obtained and signed.   Tthe patient was placed in a comfortable position. Lateral approach was used. Skin was prepped with Betadine and alcohol  and anesthetized with a cooling spray. Then, a 5 cc syringe with a 2 inch long 24-gauge  needle was used for a joint injection.. The needle was advanced  Into the subacromial space.The bursa was injected with 3 mL of 2% lidocaine and 40 mg of Depo-Medrol .  Band-Aid was applied.   Tolerated well. Complications: None. Good pain relief following the procedure.   Postprocedure instructions :    A Band-Aid should be left on for 12 hours. Injection therapy is not a cure itself. It is used in conjunction with other modalities. You can use nonsteroidal anti-inflammatories like ibuprofen , hot and cold compresses. Rest is recommended in the next 24 hours. You need to report immediately  if fever, chills or any signs of infection develop.    Lab Results  Component Value Date   WBC 8.1 02/04/2019   HGB 12.2 02/04/2019   HCT 37.1 02/04/2019   PLT 260.0 02/04/2019   GLUCOSE 93 02/04/2019   CHOL 184 02/04/2019   TRIG 217.0 (H) 02/04/2019   HDL 41.30 02/04/2019   LDLDIRECT 111.0 02/04/2019   LDLCALC 102 (H) 01/16/2017   ALT 13 02/04/2019   AST 16 02/04/2019   NA 141 02/04/2019   K 4.2 02/04/2019   CL 103 02/04/2019   CREATININE 0.84 02/04/2019   BUN 11 02/04/2019   CO2 30 02/04/2019   TSH 1.54 02/04/2019   INR 0.99 01/26/2017    MM DIAG BREAST TOMO UNI RIGHT  Result Date: 03/25/2020 CLINICAL DATA:  Possible asymmetry in the central right breast on a recent screening mammogram in the craniocaudal projection. EXAM: DIGITAL DIAGNOSTIC UNILATERAL RIGHT MAMMOGRAM WITH TOMO AND CAD COMPARISON:  Previous exam(s). ACR Breast Density Category b: There are scattered areas of fibroglandular density. FINDINGS: 3D tomographic and 2D generated true lateral and spot compression craniocaudal views of  the right breast demonstrate normal appearing fibroglandular tissue at the location of recently suspected asymmetry, unchanged compared to previous examinations. Mammographic images were processed with CAD. IMPRESSION: No evidence of malignancy. The recently suspected right breast asymmetry was close apposition of normal breast tissue. RECOMMENDATION: Bilateral screening mammogram in 1 year. I have discussed the findings and recommendations with the patient. If applicable, a reminder letter will be sent to the patient regarding the next appointment. BI-RADS CATEGORY  1: Negative. Electronically Signed   By: Claudie Revering [name]   On: 03/25/2020 08:55    Assessment & Plan:   There are no diagnoses linked to this encounter.   No orders of the defined types were placed in this encounter.    Follow-up: No follow-ups on file.  Walker Kehr, MD

## 2020-05-24 NOTE — Addendum Note (Signed)
Addended by: Earnstine Regal on: 05/24/2020 10:04 AM   Modules accepted: Orders

## 2020-05-24 NOTE — Addendum Note (Signed)
Addended by: Cresenciano Lick on: 05/24/2020 09:29 AM   Modules accepted: Orders

## 2020-05-24 NOTE — Assessment & Plan Note (Signed)
Vit D 

## 2020-05-25 ENCOUNTER — Other Ambulatory Visit: Payer: Self-pay | Admitting: *Deleted

## 2020-05-25 MED ORDER — SOLIFENACIN SUCCINATE 10 MG PO TABS
10.0000 mg | ORAL_TABLET | Freq: Every day | ORAL | 3 refills | Status: DC
Start: 2020-05-25 — End: 2021-05-24

## 2020-07-18 ENCOUNTER — Ambulatory Visit (INDEPENDENT_AMBULATORY_CARE_PROVIDER_SITE_OTHER): Payer: Medicare PPO | Admitting: Internal Medicine

## 2020-07-18 ENCOUNTER — Other Ambulatory Visit: Payer: Self-pay

## 2020-07-18 ENCOUNTER — Encounter: Payer: Self-pay | Admitting: Internal Medicine

## 2020-07-18 DIAGNOSIS — G8929 Other chronic pain: Secondary | ICD-10-CM

## 2020-07-18 DIAGNOSIS — M25512 Pain in left shoulder: Secondary | ICD-10-CM | POA: Diagnosis not present

## 2020-07-18 MED ORDER — METHYLPREDNISOLONE 4 MG PO TBPK: ORAL_TABLET | ORAL | 0 refills | Status: DC

## 2020-07-18 MED ORDER — TRAMADOL HCL 50 MG PO TABS
25.0000 mg | ORAL_TABLET | Freq: Three times a day (TID) | ORAL | 0 refills | Status: AC | PRN
Start: 2020-07-18 — End: 2020-07-23

## 2020-07-18 NOTE — Patient Instructions (Signed)
Voltaren gel   For a mild COVID-19 case you can take zinc 50 mg a day for 1 week, vitamin C 1000 mg daily for 1 week, vitamin D2 50,000 units weekly for 2 months (unless you are taking vitamin D daily already), Quercetin 500 mg twice a day for 1 week (if you can get it quick enough).  Maintain good oral hydration and take Tylenol with high fever. Feel better! Thanks  You can buy COVID-19 express tests for home use. They are inexpensive, roughly $15 for 2 tests.  There is a link below: [email or web address]  You can order 4 free COVID-19 home tests via [employer]

## 2020-07-18 NOTE — Assessment & Plan Note (Signed)
Not better Medrol pack Tramadol prn Exercise for ROM

## 2020-07-18 NOTE — Progress Notes (Signed)
Subjective:  Patient ID: [name], female    DOB: [birth year]  Age: 75 y.o. MRN: [id number]  CC: Arm Pain (Ongoing (L) arm pain)   HPI [name] presents for L shoulder pain - not better after the shot    Outpatient Medications Prior to Visit  Medication Sig Dispense Refill  . ALPRAZolam (XANAX) 0.5 MG tablet Take 1 tablet (0.5 mg total) by mouth 2 (two) times daily as needed. 60 tablet 3  . aspirin 81 MG tablet Take 81 mg by mouth daily.    Marland Kitchen buPROPion (WELLBUTRIN XL) 150 MG 24 hr tablet Take 1 tablet (150 mg total) by mouth daily. 90 tablet 3  . Cholecalciferol (VITAMIN D) 2000 UNITS CAPS Take 1 capsule by mouth daily.    . fish oil-omega-3 fatty acids 1000 MG capsule Take by mouth daily. Take  600 mg daily    . flurbiprofen (ANSAID) 100 MG tablet Take 1 tablet earliest onset of migraine. May take every 8 hours as needed, maximum 300mg  in 24h 30 tablet 3  . fluticasone (FLONASE) 50 MCG/ACT nasal spray Place 2 sprays into both nostrils daily. 48 g 3  . gabapentin (NEURONTIN) 100 MG capsule Take 1 capsule (100 mg total) by mouth at bedtime. 90 capsule 3  . ibuprofen (ADVIL) 600 MG tablet TAKE 1 TAB BY MOUTH EVERY 8 HRS FOR HEADACHE OR MODERATE PAIN. TAKE 2X DAILY X 2WKS THEN AS NEEDED 60 tablet 0  . linaclotide (LINZESS) 290 MCG CAPS capsule TAKE 1 CAPSULE BY MOUTH  DAILY BEFORE BREAKFAST 90 capsule 3  . lovastatin (MEVACOR) 40 MG tablet Take 1 tablet (40 mg total) by mouth at bedtime. 90 tablet 3  . Multiple Vitamins-Minerals (OCUVITE PO) Take 2 tablets by mouth daily.    Marland Kitchen omeprazole (PRILOSEC) 40 MG capsule TAKE 1 CAPSULE BY MOUTH  TWICE DAILY BEFORE MEALS 180 capsule 3  . oxybutynin (DITROPAN-XL) 5 MG 24 hr tablet Take 1 tablet by mouth daily.    . Probiotic Product (ALIGN) 4 MG CAPS Take 1 capsule (4 mg total) by mouth daily. 30 capsule 1  . solifenacin (VESICARE) 10 MG tablet Take 1 tablet (10 mg total) by mouth daily. 90 tablet 3  . traZODone (DESYREL) 50 MG tablet TAKE 1  TABLET BY MOUTH AT  BEDTIME 90 tablet 3  . verapamil (CALAN-SR) 240 MG CR tablet Take 1 tablet (240 mg total) by mouth at bedtime. 90 tablet 3  . vitamin B-12 (CYANOCOBALAMIN) 1000 MCG tablet Take 500 mcg by mouth daily.    . methylPREDNISolone (MEDROL DOSEPAK) 4 MG TBPK tablet As directed (Patient not taking: Reported on 07/18/2020) 21 tablet 0  . polyethylene glycol (MIRALAX) packet Take 17 g by mouth daily. (Patient not taking: Reported on 07/18/2020) 14 each 0  . SUMAtriptan (IMITREX) 100 MG tablet TAKE 1 TABLET BY MOUTH EVERY 2 HOURS AS NEEDED FOR MIGRAINE. MAY REPEAT IN 2 HOURS IF HEADACHE PERSISTS OR RECURS. (Patient not taking: Reported on 07/18/2020) 12 tablet 5   No facility-administered medications prior to visit.    ROS: Review of Systems  Objective:  BP (!) 142/78 (BP Location: Left Arm)   Pulse 85   Temp 98.4 F (36.9 C) (Oral)   Wt 201 lb 3.2 oz (91.3 kg)   SpO2 96%   BMI 39.29 kg/m   BP Readings from Last 3 Encounters:  07/18/20 (!) 142/78  05/24/20 138/82  04/08/20 116/68    Wt Readings from Last 3 Encounters:  07/18/20  201 lb 3.2 oz (91.3 kg)  05/24/20 199 lb 9.6 oz (90.5 kg)  04/08/20 198 lb (89.8 kg)    Physical Exam  Lab Results  Component Value Date   WBC 8.0 05/24/2020   HGB 12.8 05/24/2020   HCT 39.1 05/24/2020   PLT 219.0 05/24/2020   GLUCOSE 94 05/24/2020   CHOL 174 05/24/2020   TRIG 127.0 05/24/2020   HDL 41.20 05/24/2020   LDLDIRECT 111.0 02/04/2019   LDLCALC 107 (H) 05/24/2020   ALT 8 05/24/2020   AST 11 05/24/2020   NA 143 05/24/2020   K 4.3 05/24/2020   CL 106 05/24/2020   CREATININE 0.80 05/24/2020   BUN 13 05/24/2020   CO2 30 05/24/2020   TSH 1.22 05/24/2020   INR 0.99 01/26/2017    MM DIAG BREAST TOMO UNI RIGHT  Result Date: 03/25/2020 CLINICAL DATA:  Possible asymmetry in the central right breast on a recent screening mammogram in the craniocaudal projection. EXAM: DIGITAL DIAGNOSTIC UNILATERAL RIGHT MAMMOGRAM WITH TOMO  AND CAD COMPARISON:  Previous exam(s). ACR Breast Density Category b: There are scattered areas of fibroglandular density. FINDINGS: 3D tomographic and 2D generated true lateral and spot compression craniocaudal views of the right breast demonstrate normal appearing fibroglandular tissue at the location of recently suspected asymmetry, unchanged compared to previous examinations. Mammographic images were processed with CAD. IMPRESSION: No evidence of malignancy. The recently suspected right breast asymmetry was close apposition of normal breast tissue. RECOMMENDATION: Bilateral screening mammogram in 1 year. I have discussed the findings and recommendations with the patient. If applicable, a reminder letter will be sent to the patient regarding the next appointment. BI-RADS CATEGORY  1: Negative. Electronically Signed   By: Claudie Revering [name]   On: 03/25/2020 08:55    Assessment & Plan:    Walker Kehr, MD

## 2020-08-10 DIAGNOSIS — M542 Cervicalgia: Secondary | ICD-10-CM | POA: Diagnosis not present

## 2020-08-10 DIAGNOSIS — M25512 Pain in left shoulder: Secondary | ICD-10-CM | POA: Diagnosis not present

## 2020-08-23 ENCOUNTER — Ambulatory Visit: Payer: Medicare PPO | Admitting: Internal Medicine

## 2020-08-29 DIAGNOSIS — M25512 Pain in left shoulder: Secondary | ICD-10-CM | POA: Diagnosis not present

## 2020-08-29 DIAGNOSIS — M79642 Pain in left hand: Secondary | ICD-10-CM | POA: Diagnosis not present

## 2020-09-09 ENCOUNTER — Other Ambulatory Visit: Payer: Self-pay | Admitting: *Deleted

## 2020-09-09 MED ORDER — BUPROPION HCL ER (XL) 150 MG PO TB24: 150.0000 mg | ORAL_TABLET | Freq: Every day | ORAL | 2 refills | Status: DC

## 2020-10-17 DIAGNOSIS — Z96653 Presence of artificial knee joint, bilateral: Secondary | ICD-10-CM | POA: Diagnosis not present

## 2020-10-17 DIAGNOSIS — M25561 Pain in right knee: Secondary | ICD-10-CM | POA: Insufficient documentation

## 2020-10-17 DIAGNOSIS — M21161 Varus deformity, not elsewhere classified, right knee: Secondary | ICD-10-CM | POA: Diagnosis not present

## 2020-10-17 DIAGNOSIS — T84032A Mechanical loosening of internal right knee prosthetic joint, initial encounter: Secondary | ICD-10-CM | POA: Diagnosis not present

## 2020-10-17 DIAGNOSIS — M25562 Pain in left knee: Secondary | ICD-10-CM | POA: Diagnosis not present

## 2020-11-01 ENCOUNTER — Ambulatory Visit: Payer: Medicare PPO | Admitting: Internal Medicine

## 2020-11-03 ENCOUNTER — Ambulatory Visit: Payer: Medicare PPO | Admitting: Internal Medicine

## 2020-11-08 ENCOUNTER — Encounter: Payer: Self-pay | Admitting: Internal Medicine

## 2020-11-08 ENCOUNTER — Telehealth (INDEPENDENT_AMBULATORY_CARE_PROVIDER_SITE_OTHER): Payer: Medicare PPO | Admitting: Internal Medicine

## 2020-11-08 DIAGNOSIS — J069 Acute upper respiratory infection, unspecified: Secondary | ICD-10-CM

## 2020-11-08 MED ORDER — ONDANSETRON HCL 4 MG PO TABS: 4.0000 mg | ORAL_TABLET | Freq: Three times a day (TID) | ORAL | 0 refills | Status: DC | PRN

## 2020-11-08 MED ORDER — PROMETHAZINE-CODEINE 6.25-10 MG/5ML PO SYRP: 5.0000 mL | ORAL_SOLUTION | ORAL | 0 refills | Status: DC | PRN

## 2020-11-08 MED ORDER — AZITHROMYCIN 250 MG PO TABS: ORAL_TABLET | ORAL | 0 refills | Status: DC

## 2020-11-08 NOTE — Progress Notes (Signed)
Virtual Visit via Telephone Note  I connected with [name] on 11/08/20 at  3:20 PM EDT by telephone and verified that I am speaking with the correct person using two identifiers.  Location: Patient: Home Provider: Home office   I discussed the limitations, risks, security and privacy concerns of performing an evaluation and management service by telephone and the availability of in person appointments. I also discussed with the patient that there may be a patient responsible charge related to this service. The patient expressed understanding and agreed to proceed.   History of Present Illness:   The patient is complaining of being sick for 1 week.  He started with nausea and vomiting.  Then she developed dry cough and sore throat.  She continues to have bad cough.  It has been nonproductive.  She has been able to sleep at night.  She continues to be nauseated at times.  She did a COVID test that was negative.  Observations/Objective:  The patient sounds normal on the phone Assessment and Plan: See plan  Follow Up Instructions:    I discussed the assessment and treatment plan with the patient. The patient was provided an opportunity to ask questions and all were answered. The patient agreed with the plan and demonstrated an understanding of the instructions.   The patient was advised to call back or seek an in-person evaluation if the symptoms worsen or if the condition fails to improve as anticipated.  I provided 14 minutes of non-face-to-face time during this encounter.   Walker Kehr, MD

## 2020-11-08 NOTE — Assessment & Plan Note (Addendum)
Prescribed prom-cod syrup prn cough Z-Pak prescribed Zofran as needed nausea Call if not better

## 2020-12-21 ENCOUNTER — Ambulatory Visit (INDEPENDENT_AMBULATORY_CARE_PROVIDER_SITE_OTHER): Payer: Medicare PPO

## 2020-12-21 DIAGNOSIS — Z0000 Encounter for general adult medical examination without abnormal findings: Secondary | ICD-10-CM | POA: Diagnosis not present

## 2020-12-21 NOTE — Progress Notes (Addendum)
I connected with De Blanch today by telephone and verified that I am speaking with the correct person using two identifiers. Location patient: home Location provider: work Persons participating in the virtual visit: [name] and Naylor, LPN.   I discussed the limitations, risks, security and privacy concerns of performing an evaluation and management service by telephone and the availability of in person appointments. I also discussed with the patient that there may be a patient responsible charge related to this service. The patient expressed understanding and verbally consented to this telephonic visit.    Interactive audio and video telecommunications were attempted between this provider and patient, however failed, due to patient having technical difficulties OR patient did not have access to video capability.  We continued and completed visit with audio only.  Some vital signs may be absent or patient reported.   Time Spent with patient on telephone encounter: 30 minutes.  Subjective:   [name] is a 75 y.o. female who presents for Medicare Annual (Subsequent) preventive examination.  Review of Systems     Cardiac Risk Factors include: advanced age (>16men, >6 women);dyslipidemia;family history of premature cardiovascular disease;hypertension     Objective:    There were no vitals filed for this visit. There is no height or weight on file to calculate BMI.  Advanced Directives 12/21/2020 07/30/2018 09/02/2017 08/28/2017 02/07/2017 02/07/2017 02/01/2017  Does Patient Have a Medical Advance Directive? No No No No No No No  Would patient like information on creating a medical advance directive? No - Patient declined No - Patient declined No - Patient declined No - Patient declined No - Patient declined - No - Patient declined    Current Medications (verified) Outpatient Encounter Medications as of 12/21/2020  Medication Sig   ALPRAZolam (XANAX) 0.5 MG tablet Take 1  tablet (0.5 mg total) by mouth 2 (two) times daily as needed.   aspirin 81 MG tablet Take 81 mg by mouth daily.   buPROPion (WELLBUTRIN XL) 150 MG 24 hr tablet Take 1 tablet (150 mg total) by mouth daily.   Cholecalciferol (VITAMIN D) 2000 UNITS CAPS Take 1 capsule by mouth daily.   fish oil-omega-3 fatty acids 1000 MG capsule Take by mouth daily. Take  600 mg daily   flurbiprofen (ANSAID) 100 MG tablet Take 1 tablet earliest onset of migraine. May take every 8 hours as needed, maximum 300mg  in 24h   fluticasone (FLONASE) 50 MCG/ACT nasal spray Place 2 sprays into both nostrils daily.   gabapentin (NEURONTIN) 100 MG capsule Take 1 capsule (100 mg total) by mouth at bedtime.   ibuprofen (ADVIL) 600 MG tablet TAKE 1 TAB BY MOUTH EVERY 8 HRS FOR HEADACHE OR MODERATE PAIN. TAKE 2X DAILY X 2WKS THEN AS NEEDED   linaclotide (LINZESS) 290 MCG CAPS capsule TAKE 1 CAPSULE BY MOUTH  DAILY BEFORE BREAKFAST   lovastatin (MEVACOR) 40 MG tablet Take 1 tablet (40 mg total) by mouth at bedtime.   Multiple Vitamins-Minerals (OCUVITE PO) Take 2 tablets by mouth daily.   omeprazole (PRILOSEC) 40 MG capsule TAKE 1 CAPSULE BY MOUTH  TWICE DAILY BEFORE MEALS   ondansetron (ZOFRAN) 4 MG tablet Take 1 tablet (4 mg total) by mouth every 8 (eight) hours as needed for nausea or vomiting.   oxybutynin (DITROPAN-XL) 5 MG 24 hr tablet Take 1 tablet by mouth daily.   Probiotic Product (ALIGN) 4 MG CAPS Take 1 capsule (4 mg total) by mouth daily.   solifenacin (VESICARE) 10 MG tablet Take  1 tablet (10 mg total) by mouth daily.   traZODone (DESYREL) 50 MG tablet TAKE 1 TABLET BY MOUTH AT  BEDTIME   verapamil (CALAN-SR) 240 MG CR tablet Take 1 tablet (240 mg total) by mouth at bedtime.   vitamin B-12 (CYANOCOBALAMIN) 1000 MCG tablet Take 500 mcg by mouth daily.   azithromycin (ZITHROMAX Z-PAK) 250 MG tablet As directed (Patient not taking: Reported on 12/21/2020)   methylPREDNISolone (MEDROL DOSEPAK) 4 MG TBPK tablet As directed  (Patient not taking: Reported on 12/21/2020)   promethazine-codeine (PHENERGAN WITH CODEINE) 6.25-10 MG/5ML syrup Take 5 mLs by mouth every 4 (four) hours as needed. (Patient not taking: Reported on 12/21/2020)   No facility-administered encounter medications on file as of 12/21/2020.    Allergies (verified) Levaquin [levofloxacin]   History: Past Medical History:  Diagnosis Date   Anemia    Anxiety    B12 DEFICIENCY    Blood transfusion without reported diagnosis    Cataract    Cholecystitis, unspecified    Chronic sinusitis    DEPRESSION    Diastolic dysfunction    DIVERTICULOSIS, COLON    DYSLIPIDEMIA    Dysrhythmia    PVC's   Fibromyalgia    GERD    GERD (gastroesophageal reflux disease)    Hyperplastic colon polyp    HYPERTENSION    INSOMNIA, PERSISTENT    Morbid obesity ([place])    OSTEOARTHRITIS    Osteoporosis    PONV (postoperative nausea and vomiting)    Skin cancer 2018   upper lip    SYNCOPE    Ulcer of esophagus    VITAMIN D DEFICIENCY    Past Surgical History:  Procedure Laterality Date   ABDOMINAL HYSTERECTOMY     APPENDECTOMY     BREAST BIOPSY Left 2010   Stero biopsy   BREAST EXCISIONAL BIOPSY Left 1985   Benign    CESAREAN SECTION     CHOLECYSTECTOMY     COLONOSCOPY     GASTROPLASTY     gatric stapling     LAPAROSCOPY N/A 01/30/2017   Procedure: LAPAROSCOPY DIAGNOSTIC WITH LAPAROSCOPIC LYSIS OF ADHESIONS;  Surgeon: Excell Seltzer, MD;  Location: [facility];  Service: General;  Laterality: N/A;   MIDDLE EAR SURGERY Right    NASAL SINUS SURGERY Left 08/06/2018   Procedure: Endoscopic SPHENOIDECTOMY;  Surgeon: Izora Gala, MD;  Location: [place];  Service: ENT;  Laterality: Left;   revearsal of gastric stapling     TONSILLECTOMY     TOTAL KNEE ARTHROPLASTY     rt.   TUBAL LIGATION     TYMPANOPLASTY Right 10/22/2016   Procedure: RIGHT TYMPANOPLASTY;  Surgeon: Izora Gala, MD;  Location: [place];  Service: ENT;   Laterality: Right;   TYMPANOPLASTY Right 09/02/2017   Procedure: REVISION RIGHT TYMPANOPLASTY;  Surgeon: Izora Gala, MD;  Location: [place];  Service: ENT;  Laterality: Right;   Family History  Problem Relation Age of Onset   Emphysema Father    Heart disease Father    Arthritis Mother    Hypertension Mother        pulmonary   Heart disease Mother    Colon cancer Maternal Aunt    Diabetes Sister    Lung cancer Sister    Colon cancer Maternal Uncle    Colon cancer Other        nephew   Stomach cancer Other    Diabetes Maternal Aunt        x  4   Diabetes Maternal Uncle        x 3   Diabetes Paternal Uncle         1   Kidney disease Other        niece   Diabetes Sister    COPD Sister    Heart Problems Daughter    Diabetes Daughter    Breast cancer Paternal Aunt 66   Esophageal cancer Neg Hx    Liver disease Neg Hx    Rectal cancer Neg Hx    Social History   Socioeconomic History   Marital status: Married    Spouse name: Not on file   Number of children: 2   Years of education: Not on file   Highest education level: Some college, no degree  Occupational History   Occupation: office work  Tobacco Use   Smoking status: Never   Smokeless tobacco: Never  Vaping Use   Vaping Use: Never used  Substance and Sexual Activity   Alcohol use: No   Drug use: No   Sexual activity: Not on file  Other Topics Concern   Not on file  Social History Narrative   Patient is right-handed. She lives with her husband in a one level home. She drinks 1-2 diet Cokes a day and rarely drinks coffee. She does not exercise.   Social Determinants of Health   Financial Resource Strain: Low Risk    Difficulty of Paying Living Expenses: Not hard at all  Food Insecurity: No Food Insecurity   Worried About Charity fundraiser in the Last Year: Never true   [place] in the Last Year: Never true  Transportation Needs: No Transportation Needs   Lack of Transportation  (Medical): No   Lack of Transportation (Non-Medical): No  Physical Activity: Sufficiently Active   Days of Exercise per Week: 5 days   Minutes of Exercise per Session: 30 min  Stress: No Stress Concern Present   Feeling of Stress : Not at all  Social Connections: Moderately Integrated   Frequency of Communication with Friends and Family: More than three times a week   Frequency of Social Gatherings with Friends and Family: Once a week   Attends Religious Services: More than 4 times per year   Active Member of [employer] or Organizations: Yes   Attends Archivist Meetings: More than 4 times per year   Marital Status: Widowed    Tobacco Counseling Counseling given: Not Answered   Clinical Intake:  Pre-visit preparation completed: Yes  Pain : No/denies pain     Nutritional Risks: Unintentional weight loss Diabetes: No  How often do you need to have someone help you when you read instructions, pamphlets, or other written materials from your doctor or pharmacy?: 1 - Never What is the last grade level you completed in school?: [place]; college courses (Bookkeeping/Clerical)  Diabetic? no  Interpreter Needed?: No  Information entered by :: Lisette Abu, LPN   Activities of Daily Living In your present state of health, do you have any difficulty performing the following activities: 12/21/2020 04/08/2020  Hearing? N N  Vision? N N  Difficulty concentrating or making decisions? N N  Walking or climbing stairs? N N  Dressing or bathing? N N  Doing errands, shopping? N N  Preparing Food and eating ? N -  Using the Toilet? N -  In the past six months, have you accidently leaked urine? N -  Do you have problems  with loss of bowel control? N -  Managing your Medications? N -  Managing your Finances? N -  Housekeeping or managing your Housekeeping? N -  Some recent data might be hidden    Patient Care Team: Plotnikov, Evie Lacks, MD as PCP -  General Henrene Pastor Docia Chuck, MD (Gastroenterology) Bo Merino, MD (Rheumatology) Lonia Skinner, MD as Consulting Physician (Ophthalmology)  Indicate any recent Medical Services you may have received from other than [facility] providers in the past year (date may be approximate).     Assessment:   This is a routine wellness examination for [place].  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  No hearing aids. Patient of Dr. Lucien Mons. Vision Screening - Comments:: Patient wears glasses. Eye exams done once a year at [facility] (Dr. Eulas Post)  Dietary issues and exercise activities discussed: Current Exercise Habits: Home exercise routine, Type of exercise: walking, Time (Minutes): 30, Frequency (Times/Week): 5, Weekly Exercise (Minutes/Week): 150, Intensity: Moderate, Exercise limited by: orthopedic condition(s)   Goals Addressed               This Visit's Progress     Patient Stated (pt-stated)        My goal is to continue to lose weight by being physically active, watching my diet, cutting back on diet Coke and increasing my water intake.  I would like to lose 10-20 pounds this year.       Depression Screen PHQ 2/9 Scores 12/21/2020 01/16/2017 12/28/2015 12/24/2014  PHQ - 2 Score 0 3 1 0  PHQ- 9 Score - 11 - -    Fall Risk Fall Risk  12/21/2020 04/08/2020 05/20/2018 02/26/2018 01/16/2017  Falls in the past year? 0 0 0 No No  Number falls in past yr: 0 0 - - -  Injury with Fall? 0 0 - - -  Risk for fall due to : No Fall Risks - - - -  Follow up Falls evaluation completed - Falls evaluation completed - -    FALL RISK PREVENTION PERTAINING TO THE HOME:  Any stairs in or around the home? No  If so, are there any without handrails? No  Home free of loose throw rugs in walkways, pet beds, electrical cords, etc? Yes  Adequate lighting in your home to reduce risk of falls? Yes   ASSISTIVE DEVICES UTILIZED TO PREVENT FALLS:  Life  alert? No  Use of a cane, walker or w/c? No  Grab bars in the bathroom? Yes  Shower chair or bench in shower? Yes  Elevated toilet seat or a handicapped toilet? Yes   TIMED UP AND GO:  Was the test performed? No .  Length of time to ambulate 10 feet: 0 sec.   Gait steady and fast without use of assistive device  Cognitive Function: Normal cognitive status assessed by direct observation by this Nurse Health Advisor. No abnormalities found.          Immunizations Immunization History  Administered Date(s) Administered   Fluad Quad(high Dose 65+) 02/04/2019, 04/08/2020   Influenza Split 05/21/2011   Influenza Whole 03/23/2008, 03/23/2009, 03/01/2010   Influenza, High Dose Seasonal PF 04/21/2013, 02/23/2016, 02/27/2017, 02/26/2018   Influenza,inj,Quad PF,6+ Mos 02/26/2014   Influenza-Unspecified 05/25/2015   PFIZER(Purple Top)SARS-COV-2 Vaccination 08/02/2019, 08/24/2019   Pneumococcal Conjugate-13 01/16/2017   Pneumococcal Polysaccharide-23 06/16/2013   Td 10/14/2009   Zoster, Live 07/28/2010    TDAP status: Due, Education has been provided regarding the importance of  this vaccine. Advised may receive this vaccine at local pharmacy or Health Dept. Aware to provide a copy of the vaccination record if obtained from local pharmacy or Health Dept. Verbalized acceptance and understanding.  Flu Vaccine status: Up to date  Pneumococcal vaccine status: Up to date  Covid-19 vaccine status: Completed vaccines  Qualifies for Shingles Vaccine? Yes   Zostavax completed Yes   Shingrix Completed?: No.    Education has been provided regarding the importance of this vaccine. Patient has been advised to call insurance company to determine out of pocket expense if they have not yet received this vaccine. Advised may also receive vaccine at local pharmacy or Health Dept. Verbalized acceptance and understanding.  Screening Tests Health Maintenance  Topic Date Due   Zoster Vaccines- Shingrix  (1 of 2) Never done   COVID-19 Vaccine (3 - Pfizer risk series) 09/21/2019   TETANUS/TDAP  10/15/2019   INFLUENZA VACCINE  01/16/2021   COLONOSCOPY (Pts 45-35yrs Insurance coverage will need to be confirmed)  02/10/2024   DEXA SCAN  Completed   Hepatitis C Screening  Completed   PNA vac Low Risk Adult  Completed   HPV VACCINES  Aged Out    Health Maintenance  Health Maintenance Due  Topic Date Due   Zoster Vaccines- Shingrix (1 of 2) Never done   COVID-19 Vaccine (3 - Pfizer risk series) 09/21/2019   TETANUS/TDAP  10/15/2019    Colorectal cancer screening: No longer required.   Mammogram status: Completed 03/25/2020. Repeat every year  Bone Density status: Completed 05/25/2016. Results reflect: Bone density results: OSTEOPENIA. Repeat every 2 years.  Lung Cancer Screening: (Low Dose CT Chest recommended if Age 57-80 years, 30 pack-year currently smoking OR have quit w/in 15years.) does not qualify.   Lung Cancer Screening Referral: no  Additional Screening:  Hepatitis C Screening: does qualify; Completed: yes  Vision Screening: Recommended annual ophthalmology exams for early detection of glaucoma and other disorders of the eye. Is the patient up to date with their annual eye exam?  Yes  Who is the provider or what is the name of the office in which the patient attends annual eye exams? [facility] If pt is not established with a provider, would they like to be referred to a provider to establish care? No .   Dental Screening: Recommended annual dental exams for proper oral hygiene  Community Resource Referral / Chronic Care Management: CRR required this visit?  No   CCM required this visit?  No      Plan:     I have personally reviewed and noted the following in the patient's chart:   Medical and social history Use of alcohol, tobacco or illicit drugs  Current medications and supplements including opioid prescriptions.  Functional ability and  status Nutritional status Physical activity Advanced directives List of other physicians Hospitalizations, surgeries, and ER visits in previous 12 months Vitals Screenings to include cognitive, depression, and falls Referrals and appointments  In addition, I have reviewed and discussed with patient certain preventive protocols, quality metrics, and best practice recommendations. A written personalized care plan for preventive services as well as general preventive health recommendations were provided to patient.     Sheral Flow, LPN   06/23/3844   Nurse Notes:  Patient is cogitatively intact. There were no vitals filed for this visit. There is no height or weight on file to calculate BMI. Patient stated that she has no issues with gait or balance; does not use  any assistive devices. Medications reviewed with patient; no opioid use noted.   Medical screening examination/treatment/procedure(s) were performed by non-physician practitioner and as supervising physician I was immediately available for consultation/collaboration.  I agree with above. Lew Dawes, MD

## 2020-12-21 NOTE — Patient Instructions (Addendum)
Ms. Tarter , Thank you for taking time to come for your Medicare Wellness Visit. I appreciate your ongoing commitment to your health goals. Please review the following plan we discussed and let me know if I can assist you in the future.   Screening recommendations/referrals: Colonoscopy: last done 02/10/2019; no repeat due to age 75: last done 03/25/2020 Bone Density: last done 05/25/2016 Recommended yearly ophthalmology/optometry visit for glaucoma screening and checkup Recommended yearly dental visit for hygiene and checkup  Vaccinations: Influenza vaccine: 04/08/2020 Pneumococcal vaccine: 06/16/2013, 01/16/2017 Tdap vaccine: 10/14/2009; due every 10 years (overdue) Shingles vaccine: never done   Covid-19: 08/02/2019, 08/24/2019  Advanced directives: Advance directive discussed with you today. Even though you declined this today please call our office should you change your mind and we can give you the proper paperwork for you to fill out.  Conditions/risks identified: My goal is to continue to lose weight by being physically active, watching my diet, cutting back on diet Coke and increasing my water intake.  I would like to lose 10-20 pounds this year.  Next appointment: Please schedule your next Medicare Wellness Visit with your Nurse Health Advisor in 1 year by calling [phone].   Preventive Care 75 Years and Older, Female Preventive care refers to lifestyle choices and visits with your health care provider that can promote health and wellness. What does preventive care include? A yearly physical exam. This is also called an annual well check. Dental exams once or twice a year. Routine eye exams. Ask your health care provider how often you should have your eyes checked. Personal lifestyle choices, including: Daily care of your teeth and gums. Regular physical activity. Eating a healthy diet. Avoiding tobacco and drug use. Limiting alcohol use. Practicing safe sex. Taking  low-dose aspirin every day. Taking vitamin and mineral supplements as recommended by your health care provider. What happens during an annual well check? The services and screenings done by your health care provider during your annual well check will depend on your age, overall health, lifestyle risk factors, and family history of disease. Counseling  Your health care provider may ask you questions about your: Alcohol use. Tobacco use. Drug use. Emotional well-being. Home and relationship well-being. Sexual activity. Eating habits. History of falls. Memory and ability to understand (cognition). Work and work Statistician. Reproductive health. Screening  You may have the following tests or measurements: Height, weight, and BMI. Blood pressure. Lipid and cholesterol levels. These may be checked every 5 years, or more frequently if you are over 29 years old. Skin check. Lung cancer screening. You may have this screening every year starting at age 64 if you have a 30-pack-year history of smoking and currently smoke or have quit within the past 15 years. Fecal occult blood test (FOBT) of the stool. You may have this test every year starting at age 26. Flexible sigmoidoscopy or colonoscopy. You may have a sigmoidoscopy every 5 years or a colonoscopy every 10 years starting at age 54. Hepatitis C blood test. Hepatitis B blood test. Sexually transmitted disease (STD) testing. Diabetes screening. This is done by checking your blood sugar (glucose) after you have not eaten for a while (fasting). You may have this done every 1-3 years. Bone density scan. This is done to screen for osteoporosis. You may have this done starting at age 50. Mammogram. This may be done every 1-2 years. Talk to your health care provider about how often you should have regular mammograms. Talk with your health care provider  about your test results, treatment options, and if necessary, the need for more tests. Vaccines   Your health care provider may recommend certain vaccines, such as: Influenza vaccine. This is recommended every year. Tetanus, diphtheria, and acellular pertussis (Tdap, Td) vaccine. You may need a Td booster every 10 years. Zoster vaccine. You may need this after age 23. Pneumococcal 13-valent conjugate (PCV13) vaccine. One dose is recommended after age 13. Pneumococcal polysaccharide (PPSV23) vaccine. One dose is recommended after age 33. Talk to your health care provider about which screenings and vaccines you need and how often you need them. This information is not intended to replace advice given to you by your health care provider. Make sure you discuss any questions you have with your health care provider. Document Released: 07/01/2015 Document Revised: 02/22/2016 Document Reviewed: 04/05/2015 Elsevier Interactive Patient Education  2017 [place] Prevention in the Home Falls can cause injuries. They can happen to people of all ages. There are many things you can do to make your home safe and to help prevent falls. What can I do on the outside of my home? Regularly fix the edges of walkways and driveways and fix any cracks. Remove anything that might make you trip as you walk through a door, such as a raised step or threshold. Trim any bushes or trees on the path to your home. Use bright outdoor lighting. Clear any walking paths of anything that might make someone trip, such as rocks or tools. Regularly check to see if handrails are loose or broken. Make sure that both sides of any steps have handrails. Any raised decks and porches should have guardrails on the edges. Have any leaves, snow, or ice cleared regularly. Use sand or salt on walking paths during winter. Clean up any spills in your garage right away. This includes oil or grease spills. What can I do in the bathroom? Use night lights. Install grab bars by the toilet and in the tub and shower. Do not use towel  bars as grab bars. Use non-skid mats or decals in the tub or shower. If you need to sit down in the shower, use a plastic, non-slip stool. Keep the floor dry. Clean up any water that spills on the floor as soon as it happens. Remove soap buildup in the tub or shower regularly. Attach bath mats securely with double-sided non-slip rug tape. Do not have throw rugs and other things on the floor that can make you trip. What can I do in the bedroom? Use night lights. Make sure that you have a light by your bed that is easy to reach. Do not use any sheets or blankets that are too big for your bed. They should not hang down onto the floor. Have a firm chair that has side arms. You can use this for support while you get dressed. Do not have throw rugs and other things on the floor that can make you trip. What can I do in the kitchen? Clean up any spills right away. Avoid walking on wet floors. Keep items that you use a lot in easy-to-reach places. If you need to reach something above you, use a strong step stool that has a grab bar. Keep electrical cords out of the way. Do not use floor polish or wax that makes floors slippery. If you must use wax, use non-skid floor wax. Do not have throw rugs and other things on the floor that can make you trip. What can I do  with my stairs? Do not leave any items on the stairs. Make sure that there are handrails on both sides of the stairs and use them. Fix handrails that are broken or loose. Make sure that handrails are as long as the stairways. Check any carpeting to make sure that it is firmly attached to the stairs. Fix any carpet that is loose or worn. Avoid having throw rugs at the top or bottom of the stairs. If you do have throw rugs, attach them to the floor with carpet tape. Make sure that you have a light switch at the top of the stairs and the bottom of the stairs. If you do not have them, ask someone to add them for you. What else can I do to help  prevent falls? Wear shoes that: Do not have high heels. Have rubber bottoms. Are comfortable and fit you well. Are closed at the toe. Do not wear sandals. If you use a stepladder: Make sure that it is fully opened. Do not climb a closed stepladder. Make sure that both sides of the stepladder are locked into place. Ask someone to hold it for you, if possible. Clearly mark and make sure that you can see: Any grab bars or handrails. First and last steps. Where the edge of each step is. Use tools that help you move around (mobility aids) if they are needed. These include: Canes. Walkers. Scooters. Crutches. Turn on the lights when you go into a dark area. Replace any light bulbs as soon as they burn out. Set up your furniture so you have a clear path. Avoid moving your furniture around. If any of your floors are uneven, fix them. If there are any pets around you, be aware of where they are. Review your medicines with your doctor. Some medicines can make you feel dizzy. This can increase your chance of falling. Ask your doctor what other things that you can do to help prevent falls. This information is not intended to replace advice given to you by your health care provider. Make sure you discuss any questions you have with your health care provider. Document Released: 03/31/2009 Document Revised: 11/10/2015 Document Reviewed: 07/09/2014 Elsevier Interactive Patient Education  2017 [employer].

## 2021-01-04 IMAGING — DX DG CERVICAL SPINE COMPLETE 4+V
5 series · 5 of 5 positions shown · non-contrast
Comparison: None.

CLINICAL DATA: Left arm pain and weakness for months, worsening.

EXAM:
CERVICAL SPINE - COMPLETE 4+ VIEW

[c-spine lat]
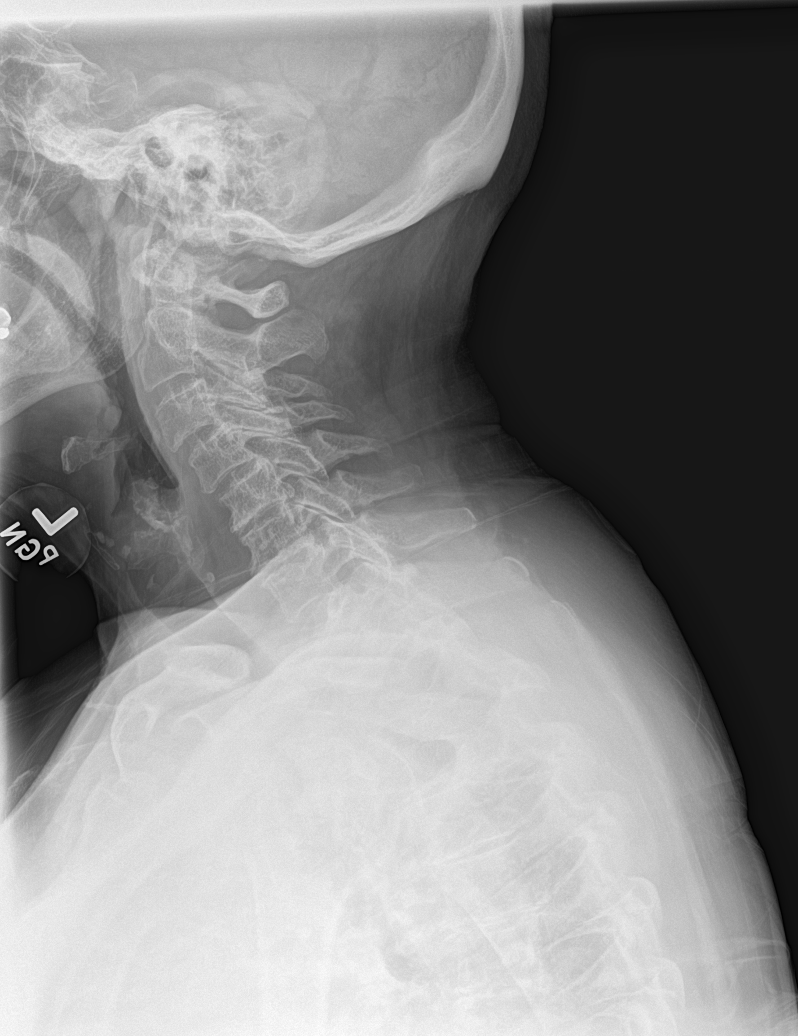

[c-spine obl (1 of 2)]
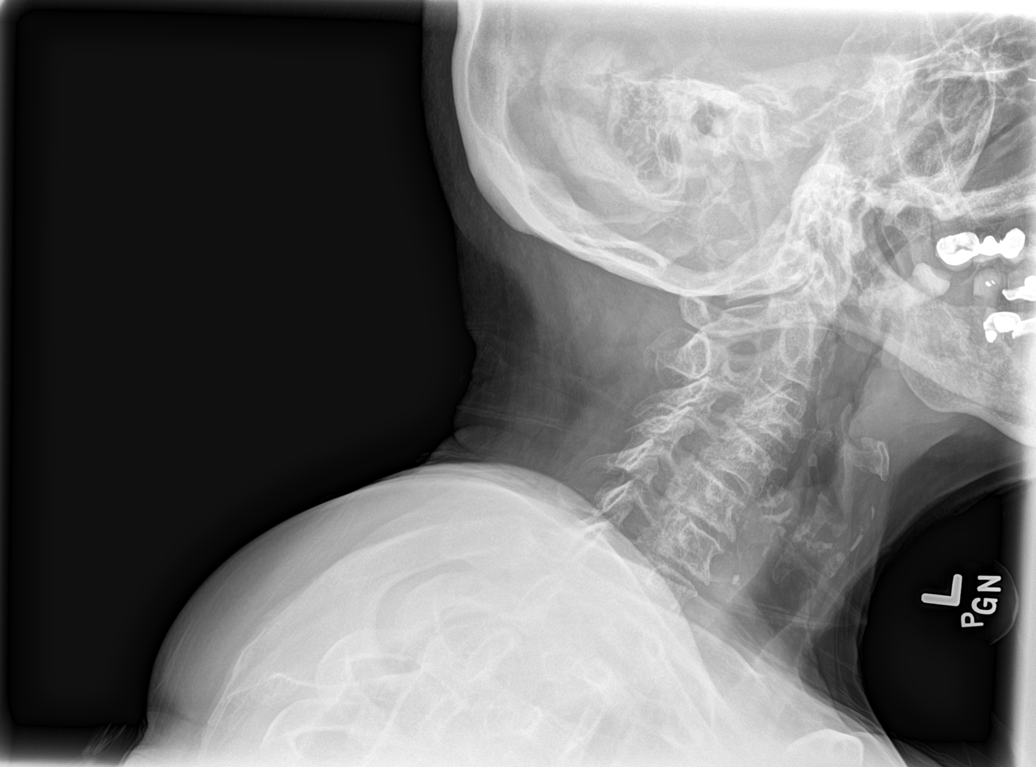

[c-spine obl (2 of 2)]
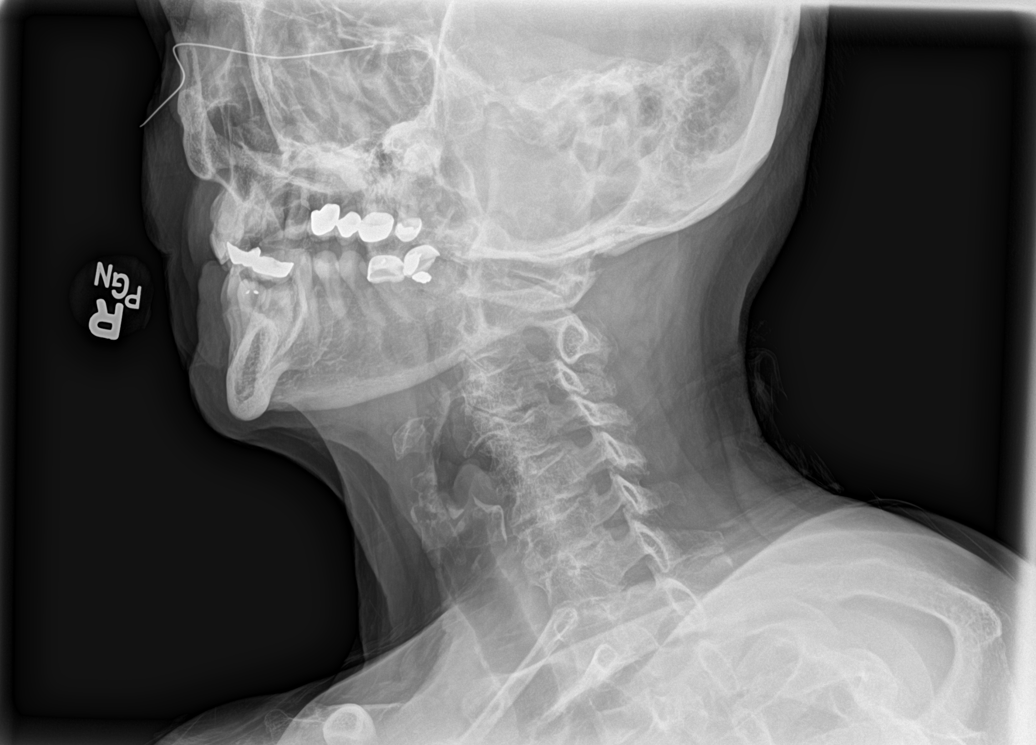

[c-spine ap]
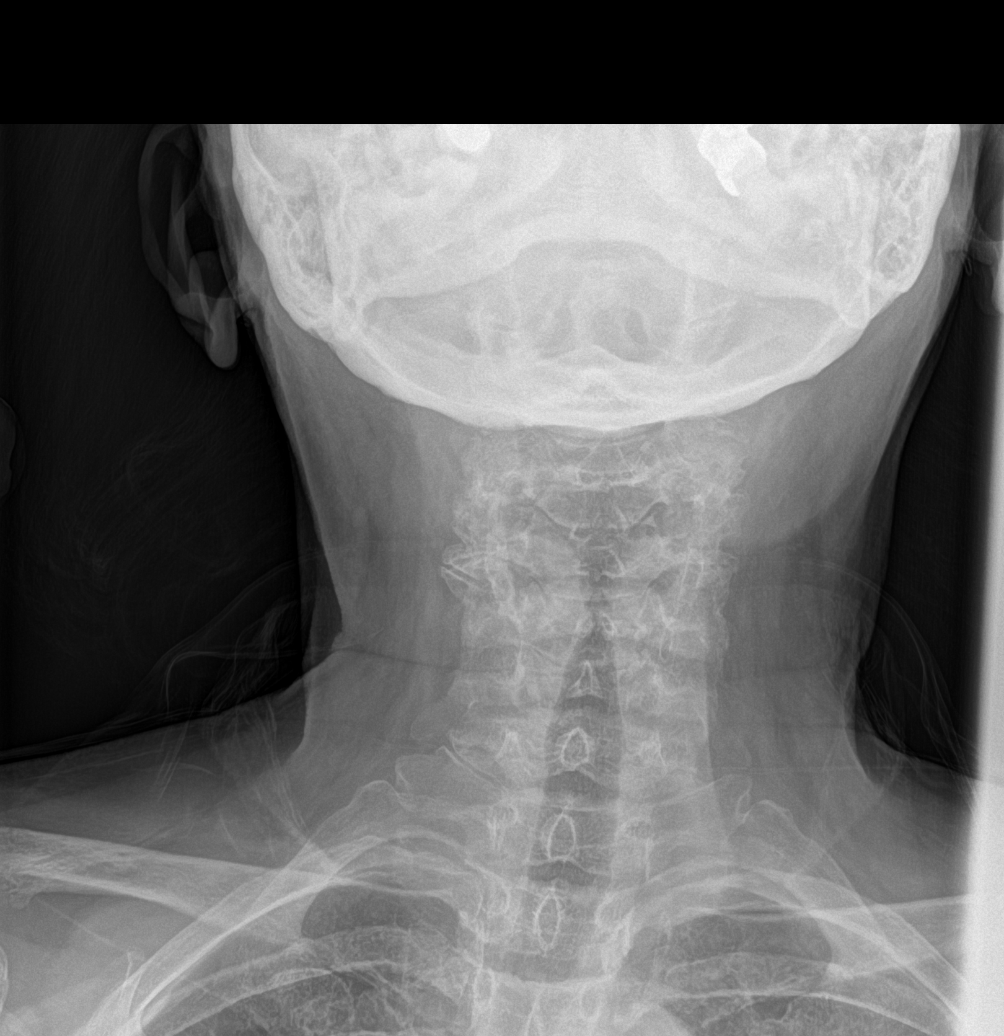

[c-spine open mouth]
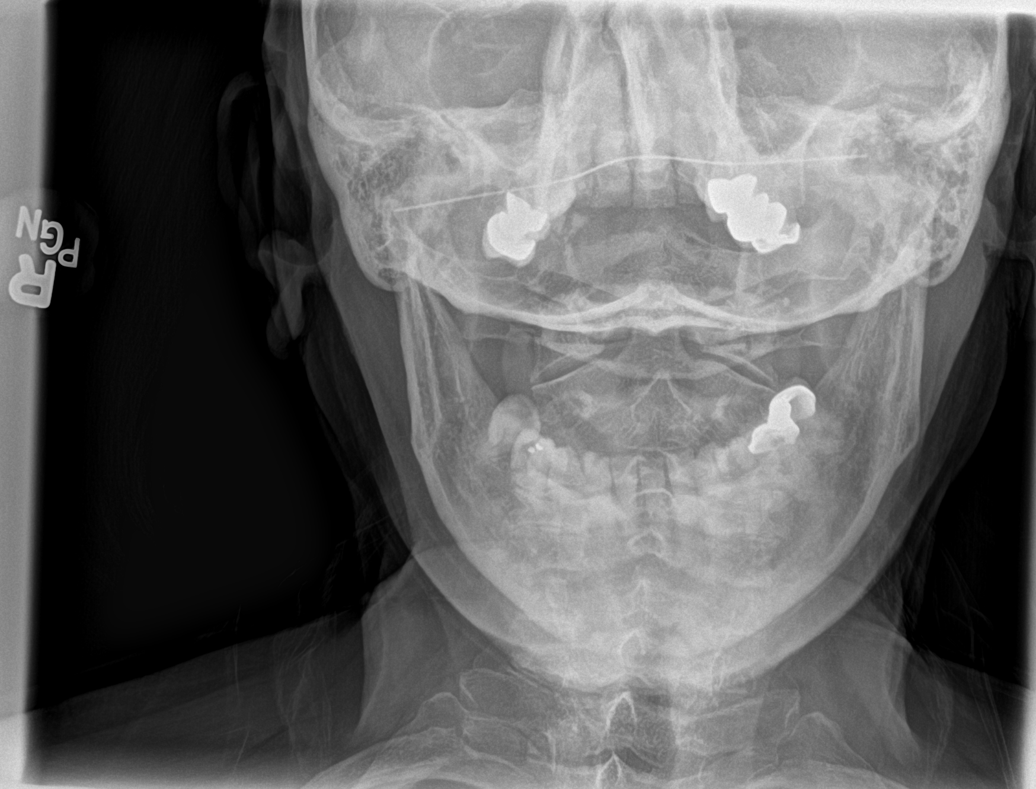

[5 of 5 positions shown; findings below may reference images not displayed]

FINDINGS: Facet degenerative changes throughout the cervical spine with
associated grade 1 anterolisthesis at the C2-3, C3-4 and C4-5
levels. Mild anterior and mild to moderate posterior spur formation
at the C5-6 level. Mild anterior and posterior spur formation at the
C6-7 level. There are also uncinate spurs with moderate foraminal
stenosis on the right at the C3-4 and C5-6 levels and on the left at
the C3-4 level. Mild foraminal stenosis on the left at the C5-6
level.
IMPRESSION: 1. Multilevel degenerative changes, as described above.
2. Bilateral foraminal stenosis at the C3-4 and C5-6 levels.

## 2021-01-18 MED ORDER — OMEPRAZOLE 40 MG PO CPDR: DELAYED_RELEASE_CAPSULE | ORAL | 1 refills | Status: DC

## 2021-01-18 MED ORDER — BUPROPION HCL ER (XL) 150 MG PO TB24: 150.0000 mg | ORAL_TABLET | Freq: Every day | ORAL | 1 refills | Status: DC

## 2021-01-18 MED ORDER — LOVASTATIN 40 MG PO TABS: 40.0000 mg | ORAL_TABLET | Freq: Every day | ORAL | 1 refills | Status: DC

## 2021-01-18 MED ORDER — VERAPAMIL HCL ER 240 MG PO TBCR: 240.0000 mg | EXTENDED_RELEASE_TABLET | Freq: Every day | ORAL | 1 refills | Status: DC

## 2021-02-15 DIAGNOSIS — Z961 Presence of intraocular lens: Secondary | ICD-10-CM | POA: Diagnosis not present

## 2021-02-15 DIAGNOSIS — H04123 Dry eye syndrome of bilateral lacrimal glands: Secondary | ICD-10-CM | POA: Diagnosis not present

## 2021-02-15 DIAGNOSIS — H43813 Vitreous degeneration, bilateral: Secondary | ICD-10-CM | POA: Diagnosis not present

## 2021-02-15 DIAGNOSIS — H35363 Drusen (degenerative) of macula, bilateral: Secondary | ICD-10-CM | POA: Diagnosis not present

## 2021-03-10 ENCOUNTER — Other Ambulatory Visit: Payer: Self-pay | Admitting: Gynecology

## 2021-03-10 DIAGNOSIS — Z1231 Encounter for screening mammogram for malignant neoplasm of breast: Secondary | ICD-10-CM

## 2021-03-13 ENCOUNTER — Telehealth: Payer: Self-pay

## 2021-03-13 NOTE — Telephone Encounter (Signed)
Pt called asking for an apptmnt due to lost of voice stemming from a persistent cough.  **Pt states she took a COVID test and it was neg on 03/12/2021.  *I was able to make her an apptmnt for tomorrow at 4pm on 03/14/2021.

## 2021-03-14 ENCOUNTER — Ambulatory Visit: Payer: Medicare PPO | Admitting: Internal Medicine

## 2021-03-14 ENCOUNTER — Encounter: Payer: Self-pay | Admitting: Internal Medicine

## 2021-03-14 ENCOUNTER — Other Ambulatory Visit: Payer: Self-pay

## 2021-03-14 ENCOUNTER — Ambulatory Visit (INDEPENDENT_AMBULATORY_CARE_PROVIDER_SITE_OTHER): Payer: Medicare PPO

## 2021-03-14 VITALS — BP 140/72 | HR 89 | Temp 98.3°F | Ht 60.0 in | Wt 178.0 lb

## 2021-03-14 DIAGNOSIS — J040 Acute laryngitis: Secondary | ICD-10-CM

## 2021-03-14 DIAGNOSIS — R059 Cough, unspecified: Secondary | ICD-10-CM

## 2021-03-14 DIAGNOSIS — E538 Deficiency of other specified B group vitamins: Secondary | ICD-10-CM | POA: Diagnosis not present

## 2021-03-14 DIAGNOSIS — Z23 Encounter for immunization: Secondary | ICD-10-CM | POA: Diagnosis not present

## 2021-03-14 DIAGNOSIS — I2583 Coronary atherosclerosis due to lipid rich plaque: Secondary | ICD-10-CM | POA: Diagnosis not present

## 2021-03-14 DIAGNOSIS — I2510 Atherosclerotic heart disease of native coronary artery without angina pectoris: Secondary | ICD-10-CM | POA: Diagnosis not present

## 2021-03-14 DIAGNOSIS — J323 Chronic sphenoidal sinusitis: Secondary | ICD-10-CM | POA: Diagnosis not present

## 2021-03-14 MED ORDER — BENZONATATE 200 MG PO CAPS: 200.0000 mg | ORAL_CAPSULE | Freq: Three times a day (TID) | ORAL | 1 refills | Status: DC | PRN

## 2021-03-14 MED ORDER — CEFDINIR 300 MG PO CAPS: 300.0000 mg | ORAL_CAPSULE | Freq: Two times a day (BID) | ORAL | 0 refills | Status: DC

## 2021-03-14 MED ORDER — HYDROCODONE BIT-HOMATROP MBR 5-1.5 MG/5ML PO SOLN: 5.0000 mL | Freq: Four times a day (QID) | ORAL | 0 refills | Status: DC | PRN

## 2021-03-14 NOTE — Progress Notes (Signed)
Subjective:  Patient ID: [name], female    DOB: 07/16/1945  Age: 75 y.o. MRN: [id number]  CC: Cough (Dry cough) and Hoarse   HPI [name] presents for voice loss and cough after COVID 3 weeks ago - can't sleep, hoarseness Nasal mucus is yellow  Outpatient Medications Prior to Visit  Medication Sig Dispense Refill   ALPRAZolam (XANAX) 0.5 MG tablet Take 1 tablet (0.5 mg total) by mouth 2 (two) times daily as needed. 60 tablet 3   aspirin 81 MG tablet Take 81 mg by mouth daily.     buPROPion (WELLBUTRIN XL) 150 MG 24 hr tablet Take 1 tablet (150 mg total) by mouth daily. 90 tablet 1   Cholecalciferol (VITAMIN D) 2000 UNITS CAPS Take 1 capsule by mouth daily.     fish oil-omega-3 fatty acids 1000 MG capsule Take by mouth daily. Take  600 mg daily     flurbiprofen (ANSAID) 100 MG tablet Take 1 tablet earliest onset of migraine. May take every 8 hours as needed, maximum 300mg  in 24h 30 tablet 3   fluticasone (FLONASE) 50 MCG/ACT nasal spray Place 2 sprays into both nostrils daily. 48 g 3   gabapentin (NEURONTIN) 100 MG capsule Take 1 capsule (100 mg total) by mouth at bedtime. 90 capsule 3   ibuprofen (ADVIL) 600 MG tablet TAKE 1 TAB BY MOUTH EVERY 8 HRS FOR HEADACHE OR MODERATE PAIN. TAKE 2X DAILY X 2WKS THEN AS NEEDED 60 tablet 0   linaclotide (LINZESS) 290 MCG CAPS capsule TAKE 1 CAPSULE BY MOUTH  DAILY BEFORE BREAKFAST 90 capsule 3   lovastatin (MEVACOR) 40 MG tablet Take 1 tablet (40 mg total) by mouth at bedtime. 90 tablet 1   Multiple Vitamins-Minerals (OCUVITE PO) Take 2 tablets by mouth daily.     omeprazole (PRILOSEC) 40 MG capsule TAKE 1 CAPSULE BY MOUTH  TWICE DAILY BEFORE MEALS 180 capsule 1   ondansetron (ZOFRAN) 4 MG tablet Take 1 tablet (4 mg total) by mouth every 8 (eight) hours as needed for nausea or vomiting. 20 tablet 0   oxybutynin (DITROPAN-XL) 5 MG 24 hr tablet Take 1 tablet by mouth daily.     Probiotic Product (ALIGN) 4 MG CAPS Take 1 capsule (4 mg total)  by mouth daily. 30 capsule 1   solifenacin (VESICARE) 10 MG tablet Take 1 tablet (10 mg total) by mouth daily. 90 tablet 3   traZODone (DESYREL) 50 MG tablet TAKE 1 TABLET BY MOUTH AT  BEDTIME 90 tablet 3   verapamil (CALAN-SR) 240 MG CR tablet Take 1 tablet (240 mg total) by mouth at bedtime. 90 tablet 1   vitamin B-12 (CYANOCOBALAMIN) 1000 MCG tablet Take 500 mcg by mouth daily.     azithromycin (ZITHROMAX Z-PAK) 250 MG tablet As directed (Patient not taking: No sig reported) 6 tablet 0   methylPREDNISolone (MEDROL DOSEPAK) 4 MG TBPK tablet As directed (Patient not taking: No sig reported) 21 tablet 0   promethazine-codeine (PHENERGAN WITH CODEINE) 6.25-10 MG/5ML syrup Take 5 mLs by mouth every 4 (four) hours as needed. (Patient not taking: No sig reported) 300 mL 0   No facility-administered medications prior to visit.    ROS: Review of Systems  Constitutional:  Positive for fatigue. Negative for activity change, appetite change, chills and unexpected weight change.  HENT:  Negative for congestion, mouth sores and sinus pressure.   Eyes:  Negative for visual disturbance.  Respiratory:  Positive for cough and shortness of breath. Negative  for chest tightness.   Gastrointestinal:  Negative for abdominal pain and nausea.  Genitourinary:  Negative for difficulty urinating, frequency and vaginal pain.  Musculoskeletal:  Negative for back pain and gait problem.  Skin:  Negative for pallor and rash.  Neurological:  Negative for dizziness, tremors, weakness, numbness and headaches.  Psychiatric/Behavioral:  Negative for confusion and sleep disturbance.    Objective:  BP 140/72 (BP Location: Left Arm)   Pulse 89   Temp 98.3 F (36.8 C) (Oral)   Ht 5' (1.524 m)   Wt 178 lb (80.7 kg)   SpO2 96%   BMI 34.76 kg/m   BP Readings from Last 3 Encounters:  03/14/21 140/72  07/18/20 (!) 142/78  05/24/20 138/82    Wt Readings from Last 3 Encounters:  03/14/21 178 lb (80.7 kg)  07/18/20 201  lb 3.2 oz (91.3 kg)  05/24/20 199 lb 9.6 oz (90.5 kg)    Physical Exam Constitutional:      General: She is not in acute distress.    Appearance: She is well-developed. She is obese.  HENT:     Head: Normocephalic.     Right Ear: External ear normal.     Left Ear: External ear normal.     Nose: Nose normal.  Eyes:     General:        Right eye: No discharge.        Left eye: No discharge.     Conjunctiva/sclera: Conjunctivae normal.     Pupils: Pupils are equal, round, and reactive to light.  Neck:     Thyroid: No thyromegaly.     Vascular: No JVD.     Trachea: No tracheal deviation.  Cardiovascular:     Rate and Rhythm: Normal rate and regular rhythm.     Heart sounds: Normal heart sounds.  Pulmonary:     Effort: No respiratory distress.     Breath sounds: No stridor. No wheezing.  Abdominal:     General: Bowel sounds are normal. There is no distension.     Palpations: Abdomen is soft. There is no mass.     Tenderness: There is no abdominal tenderness. There is no guarding or rebound.  Musculoskeletal:        General: No tenderness.     Cervical back: Normal range of motion and neck supple. No rigidity.  Lymphadenopathy:     Cervical: No cervical adenopathy.  Skin:    Findings: No erythema or rash.  Neurological:     Cranial Nerves: No cranial nerve deficit.     Motor: No abnormal muscle tone.     Coordination: Coordination normal.     Deep Tendon Reflexes: Reflexes normal.  Psychiatric:        Behavior: Behavior normal.        Thought Content: Thought content normal.        Judgment: Judgment normal.  hoarse a little bit  Lab Results  Component Value Date   WBC 8.0 05/24/2020   HGB 12.8 05/24/2020   HCT 39.1 05/24/2020   PLT 219.0 05/24/2020   GLUCOSE 94 05/24/2020   CHOL 174 05/24/2020   TRIG 127.0 05/24/2020   HDL 41.20 05/24/2020   LDLDIRECT 111.0 02/04/2019   LDLCALC 107 (H) 05/24/2020   ALT 8 05/24/2020   AST 11 05/24/2020   NA 143 05/24/2020    K 4.3 05/24/2020   CL 106 05/24/2020   CREATININE 0.80 05/24/2020   BUN 13 05/24/2020   CO2 30 05/24/2020  TSH 1.22 05/24/2020   INR 0.99 01/26/2017    MM DIAG BREAST TOMO UNI RIGHT  Result Date: 03/25/2020 CLINICAL DATA:  Possible asymmetry in the central right breast on a recent screening mammogram in the craniocaudal projection. EXAM: DIGITAL DIAGNOSTIC UNILATERAL RIGHT MAMMOGRAM WITH TOMO AND CAD COMPARISON:  Previous exam(s). ACR Breast Density Category b: There are scattered areas of fibroglandular density. FINDINGS: 3D tomographic and 2D generated true lateral and spot compression craniocaudal views of the right breast demonstrate normal appearing fibroglandular tissue at the location of recently suspected asymmetry, unchanged compared to previous examinations. Mammographic images were processed with CAD. IMPRESSION: No evidence of malignancy. The recently suspected right breast asymmetry was close apposition of normal breast tissue. RECOMMENDATION: Bilateral screening mammogram in 1 year. I have discussed the findings and recommendations with the patient. If applicable, a reminder letter will be sent to the patient regarding the next appointment. BI-RADS CATEGORY  1: Negative. Electronically Signed   By: Claudie Revering [name]   On: 03/25/2020 08:55    Assessment & Plan:   Problem List Items Addressed This Visit     B12 deficiency    Continue with vitamin B12      Coronary atherosclerosis    Continue with lovastatin and aspirin      Cough    Post COVID.  Given cefdinir.  Rule out CAP.  Will obtain a chest x-ray Hycodan cough syrup prescribed      Relevant Orders   DG Chest 2 View   Laryngitis    Post COVID.  Treat bronchitis.      Sinusitis, chronic    Post COVID.  Given cefdinir      Relevant Medications   cefdinir (OMNICEF) 300 MG capsule   HYDROcodone bit-homatropine (HYCODAN) 5-1.5 MG/5ML syrup   benzonatate (TESSALON) 200 MG capsule   Other Visit Diagnoses      Needs flu shot    -  Primary   Relevant Orders   Flu Vaccine QUAD High Dose(Fluad) (Completed)         Follow-up: No follow-ups on file.  Walker Kehr, MD

## 2021-03-16 ENCOUNTER — Encounter: Payer: Self-pay | Admitting: Internal Medicine

## 2021-03-16 NOTE — Assessment & Plan Note (Signed)
Post COVID.  Treat bronchitis.

## 2021-03-16 NOTE — Assessment & Plan Note (Signed)
Post COVID.  Given cefdinir.  Rule out CAP.  Will obtain a chest x-ray Hycodan cough syrup prescribed

## 2021-03-16 NOTE — Assessment & Plan Note (Signed)
Continue with lovastatin and aspirin

## 2021-03-16 NOTE — Assessment & Plan Note (Signed)
Continue with vitamin B12 

## 2021-03-16 NOTE — Assessment & Plan Note (Signed)
Post COVID.  Given cefdinir

## 2021-03-21 DIAGNOSIS — Z85828 Personal history of other malignant neoplasm of skin: Secondary | ICD-10-CM | POA: Diagnosis not present

## 2021-03-21 DIAGNOSIS — D2272 Melanocytic nevi of left lower limb, including hip: Secondary | ICD-10-CM | POA: Diagnosis not present

## 2021-03-21 DIAGNOSIS — L738 Other specified follicular disorders: Secondary | ICD-10-CM | POA: Diagnosis not present

## 2021-03-21 DIAGNOSIS — L821 Other seborrheic keratosis: Secondary | ICD-10-CM | POA: Diagnosis not present

## 2021-03-21 DIAGNOSIS — L814 Other melanin hyperpigmentation: Secondary | ICD-10-CM | POA: Diagnosis not present

## 2021-03-21 DIAGNOSIS — D224 Melanocytic nevi of scalp and neck: Secondary | ICD-10-CM | POA: Diagnosis not present

## 2021-03-21 DIAGNOSIS — L720 Epidermal cyst: Secondary | ICD-10-CM | POA: Diagnosis not present

## 2021-03-27 ENCOUNTER — Other Ambulatory Visit: Payer: Self-pay

## 2021-03-27 ENCOUNTER — Ambulatory Visit
Admission: RE | Admit: 2021-03-27 | Discharge: 2021-03-27 | Disposition: A | Discharge status: Home / Self Care | Payer: Medicare PPO | Source: Ambulatory Visit | Attending: Gynecology | Admitting: Gynecology

## 2021-03-27 DIAGNOSIS — Z1231 Encounter for screening mammogram for malignant neoplasm of breast: Secondary | ICD-10-CM | POA: Diagnosis not present

## 2021-04-05 ENCOUNTER — Other Ambulatory Visit: Payer: Self-pay | Admitting: Internal Medicine

## 2021-04-16 ENCOUNTER — Other Ambulatory Visit: Payer: Self-pay

## 2021-04-16 ENCOUNTER — Emergency Department (HOSPITAL_BASED_OUTPATIENT_CLINIC_OR_DEPARTMENT_OTHER): Payer: Medicare PPO

## 2021-04-16 ENCOUNTER — Emergency Department (HOSPITAL_BASED_OUTPATIENT_CLINIC_OR_DEPARTMENT_OTHER)
Admission: EM | Admit: 2021-04-16 | Discharge: 2021-04-16 | Disposition: A | Discharge status: Home / Self Care | Payer: Medicare PPO | Attending: Emergency Medicine | Admitting: Emergency Medicine

## 2021-04-16 ENCOUNTER — Encounter (HOSPITAL_BASED_OUTPATIENT_CLINIC_OR_DEPARTMENT_OTHER): Payer: Self-pay | Admitting: Obstetrics and Gynecology

## 2021-04-16 DIAGNOSIS — S199XXA Unspecified injury of neck, initial encounter: Secondary | ICD-10-CM | POA: Diagnosis not present

## 2021-04-16 DIAGNOSIS — Z85828 Personal history of other malignant neoplasm of skin: Secondary | ICD-10-CM | POA: Insufficient documentation

## 2021-04-16 DIAGNOSIS — S0990XA Unspecified injury of head, initial encounter: Secondary | ICD-10-CM | POA: Diagnosis not present

## 2021-04-16 DIAGNOSIS — Y9301 Activity, walking, marching and hiking: Secondary | ICD-10-CM | POA: Insufficient documentation

## 2021-04-16 DIAGNOSIS — W19XXXA Unspecified fall, initial encounter: Secondary | ICD-10-CM

## 2021-04-16 DIAGNOSIS — W01198A Fall on same level from slipping, tripping and stumbling with subsequent striking against other object, initial encounter: Secondary | ICD-10-CM | POA: Diagnosis not present

## 2021-04-16 DIAGNOSIS — S40022A Contusion of left upper arm, initial encounter: Secondary | ICD-10-CM | POA: Diagnosis not present

## 2021-04-16 DIAGNOSIS — Y92009 Unspecified place in unspecified non-institutional (private) residence as the place of occurrence of the external cause: Secondary | ICD-10-CM | POA: Diagnosis not present

## 2021-04-16 DIAGNOSIS — Z7982 Long term (current) use of aspirin: Secondary | ICD-10-CM | POA: Insufficient documentation

## 2021-04-16 DIAGNOSIS — I10 Essential (primary) hypertension: Secondary | ICD-10-CM | POA: Diagnosis not present

## 2021-04-16 DIAGNOSIS — I2510 Atherosclerotic heart disease of native coronary artery without angina pectoris: Secondary | ICD-10-CM | POA: Insufficient documentation

## 2021-04-16 DIAGNOSIS — S060X0A Concussion without loss of consciousness, initial encounter: Secondary | ICD-10-CM

## 2021-04-16 DIAGNOSIS — Z96651 Presence of right artificial knee joint: Secondary | ICD-10-CM | POA: Diagnosis not present

## 2021-04-16 NOTE — ED Triage Notes (Signed)
Patient presents to the ER for a fall x2 days ago. Patient reports mild headaches she has been taking tylenol for.

## 2021-04-16 NOTE — ED Notes (Signed)
Pt NAD in room 3, a/ox4. Pt states she fell 2 days ago hitting her head and has headaches since then. Denies LOC, dizziness, balance issues. +nausea. VSS

## 2021-04-16 NOTE — Discharge Instructions (Addendum)
You likely suffered a concussion following the fall.  You may continue to have mild headaches.  Continue to take Tylenol for pain control.  If you develop worsening headache or worsening nausea with vomiting, or passing out episode please return to the emergency department immediately for evaluation.  Otherwise I recommend you follow-up with your primary care provider in a couple days for reevaluation.

## 2021-04-16 NOTE — ED Notes (Signed)
Pt NAD, a/ox4. Pt verbalizes understanding of all DC and f/u instructions. All questions answered. Pt walks with steady gait to lobby at DC.  ? ?

## 2021-04-16 NOTE — ED Provider Notes (Addendum)
[facility] EMERGENCY DEPT Provider Note   CSN: [id number] Arrival date & time: 04/16/21  4098     History Chief Complaint  Patient presents with   Fall   Nausea   Head Injury    [name] is a 74 y.o. female.  75 year old female presents today following a fall that occurred 2 days ago while she was ambulating into the house and had to go up 1 step.  Patient reports she was holding onto a chair and did not raise her foot high enough to clear the step tripped and fell forward.  She reports her head hit the lower portion of a commode.  She denies any chest pain, shortness of breath, palpitations, or lightheadedness prior to the fall.  She reports denies loss of consciousness and she was able to stand up immediately with her own strength following the fall.  She reports she was little nauseous right after but did not vomit and did not have any additional nausea after that.  She denies any visual changes.  She reports her only complaint since the fall has been mild headache that she wanted to get evaluated.  She reports she has improvement in her headache with Tylenol.  She is without other complaints at this time.  She is not on anticoagulation.  She denies photophobia or phonophobia.  The history is provided by the patient. No language interpreter was used.      Past Medical History:  Diagnosis Date   Anemia    Anxiety    B12 DEFICIENCY    Blood transfusion without reported diagnosis    Cataract    Cholecystitis, unspecified    Chronic sinusitis    DEPRESSION    Diastolic dysfunction    DIVERTICULOSIS, COLON    DYSLIPIDEMIA    Dysrhythmia    PVC's   Fibromyalgia    GERD    GERD (gastroesophageal reflux disease)    Hyperplastic colon polyp    HYPERTENSION    INSOMNIA, PERSISTENT    Morbid obesity ([place])    OSTEOARTHRITIS    Osteoporosis    PONV (postoperative nausea and vomiting)    Skin cancer 2018   upper lip    SYNCOPE    Ulcer of esophagus     VITAMIN D DEFICIENCY     Patient Active Problem List   Diagnosis Date Noted   Family history of lung cancer 04/08/2020   Coronary atherosclerosis 04/08/2020   Shoulder pain, left 04/08/2020   Laryngitis 11/19/2019   Wart of face 06/08/2019   Earache on right 05/04/2019   Blurred vision, bilateral 02/19/2018   Constipation 03/22/2017   Ileus ([place])    Dehydration    SBO (small bowel obstruction) ([place]) 02/07/2017   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   Abdominal pain, LLQ 01/26/2017   Intussusception ([place]) 01/26/2017   Hearing loss 07/13/2016   Influenza 09/16/2015   Headache 12/24/2014   Neoplasm of uncertain behavior of skin 12/24/2014   Mild cognitive disorder 09/22/2014   Dysuria 04/07/2014   Cough 08/25/2013   Well adult exam 06/16/2013   Sinusitis, chronic 06/09/2013   URI, acute 06/09/2012   Restless legs syndrome (RLS) 01/26/2011   FINGER PAIN 07/28/2010   PALPITATIONS 10/18/2008   Vitamin D deficiency 10/09/2008   Dyslipidemia 10/09/2008   Morbid obesity ([place]) 10/09/2008   Essential hypertension 10/09/2008   SYNCOPE 09/27/2008   UNSPECIFIED EUSTACHIAN TUBE DISORDER 04/09/2008   INSOMNIA, PERSISTENT 07/08/2007   Adjustment disorder with mixed anxiety and depressed mood 07/08/2007  RHINITIS 07/08/2007   B12 deficiency 04/07/2007   ANEMIA, DUE TO BLOOD LOSS 04/07/2007   OSTEOARTHRITIS 04/07/2007   GERD 04/04/2007   Diverticulosis of colon 04/04/2007    Past Surgical History:  Procedure Laterality Date   ABDOMINAL HYSTERECTOMY     APPENDECTOMY     BREAST BIOPSY Left 2010   Stero biopsy   BREAST EXCISIONAL BIOPSY Left 1985   Benign    CESAREAN SECTION     CHOLECYSTECTOMY     COLONOSCOPY     GASTROPLASTY     gatric stapling     LAPAROSCOPY N/A 01/30/2017   Procedure: LAPAROSCOPY DIAGNOSTIC WITH LAPAROSCOPIC LYSIS OF ADHESIONS;  Surgeon: Excell Seltzer, MD;  Location: [facility];  Service: General;  Laterality: N/A;   MIDDLE EAR SURGERY Right    NASAL SINUS  SURGERY Left 08/06/2018   Procedure: Endoscopic SPHENOIDECTOMY;  Surgeon: Izora Gala, MD;  Location: [facility];  Service: ENT;  Laterality: Left;   revearsal of gastric stapling     TONSILLECTOMY     TOTAL KNEE ARTHROPLASTY     rt.   TUBAL LIGATION     TYMPANOPLASTY Right 10/22/2016   Procedure: RIGHT TYMPANOPLASTY;  Surgeon: Izora Gala, MD;  Location: [facility];  Service: ENT;  Laterality: Right;   TYMPANOPLASTY Right 09/02/2017   Procedure: REVISION RIGHT TYMPANOPLASTY;  Surgeon: Izora Gala, MD;  Location: [facility];  Service: ENT;  Laterality: Right;     OB History     Gravida      Para      Term      Preterm      AB      Living  2      SAB      IAB      Ectopic      Multiple      Live Births              Family History  Problem Relation Age of Onset   Emphysema Father    Heart disease Father    Arthritis Mother    Hypertension Mother        pulmonary   Heart disease Mother    Colon cancer Maternal Aunt    Diabetes Sister    Lung cancer Sister    Colon cancer Maternal Uncle    Colon cancer Other        nephew   Stomach cancer Other    Diabetes Maternal Aunt        x 4   Diabetes Maternal Uncle        x 3   Diabetes Paternal Uncle         1   Kidney disease Other        niece   Diabetes Sister    COPD Sister    Heart Problems Daughter    Diabetes Daughter    Breast cancer Paternal Aunt 65   Esophageal cancer Neg Hx    Liver disease Neg Hx    Rectal cancer Neg Hx     Social History   Tobacco Use   Smoking status: Never   Smokeless tobacco: Never  Vaping Use   Vaping Use: Never used  Substance Use Topics   Alcohol use: No   Drug use: No    Home Medications Prior to Admission medications   Medication Sig Start Date End Date Taking? Authorizing Provider  ALPRAZolam Duanne Moron) 0.5 MG tablet Take 1 tablet (0.5 mg total)  by mouth 2 (two) times daily as needed. 05/24/20   Plotnikov,  Evie Lacks, MD  aspirin 81 MG tablet Take 81 mg by mouth daily.    [provider]  benzonatate (TESSALON) 200 MG capsule Take 1 capsule (200 mg total) by mouth 3 (three) times daily as needed for cough. 03/14/21   Plotnikov, Evie Lacks, MD  buPROPion (WELLBUTRIN XL) 150 MG 24 hr tablet Take 1 tablet (150 mg total) by mouth daily. 01/18/21   Plotnikov, Evie Lacks, MD  cefdinir (OMNICEF) 300 MG capsule Take 1 capsule (300 mg total) by mouth 2 (two) times daily. 03/14/21   Plotnikov, Evie Lacks, MD  Cholecalciferol (VITAMIN D) 2000 UNITS CAPS Take 1 capsule by mouth daily.    [provider]  fish oil-omega-3 fatty acids 1000 MG capsule Take by mouth daily. Take  600 mg daily    [provider]  flurbiprofen (ANSAID) 100 MG tablet Take 1 tablet earliest onset of migraine. May take every 8 hours as needed, maximum 300mg  in 24h 05/20/18   Tomi Likens, Adam R, DO  fluticasone (FLONASE) 50 MCG/ACT nasal spray Place 2 sprays into both nostrils daily. 10/07/19   Plotnikov, Evie Lacks, MD  gabapentin (NEURONTIN) 100 MG capsule Take 1 capsule (100 mg total) by mouth at bedtime. 10/07/19   Plotnikov, Evie Lacks, MD  HYDROcodone bit-homatropine (HYCODAN) 5-1.5 MG/5ML syrup Take 5 mLs by mouth every 6 (six) hours as needed for cough. 03/14/21   Plotnikov, Evie Lacks, MD  ibuprofen (ADVIL) 600 MG tablet TAKE 1 TAB BY MOUTH EVERY 8 HRS FOR HEADACHE OR MODERATE PAIN. TAKE 2X DAILY X 2WKS THEN AS NEEDED 10/15/19   Plotnikov, Evie Lacks, MD  linaclotide (LINZESS) 290 MCG CAPS capsule TAKE 1 CAPSULE BY MOUTH  DAILY BEFORE BREAKFAST 10/07/19   Plotnikov, Evie Lacks, MD  lovastatin (MEVACOR) 40 MG tablet Take 1 tablet (40 mg total) by mouth at bedtime. 01/18/21   Plotnikov, Evie Lacks, MD  Multiple Vitamins-Minerals (OCUVITE PO) Take 2 tablets by mouth daily.    [provider]  omeprazole (PRILOSEC) 40 MG capsule TAKE 1 CAPSULE BY MOUTH  TWICE DAILY BEFORE MEALS 01/18/21   Plotnikov, Evie Lacks, MD  ondansetron  (ZOFRAN) 4 MG tablet Take 1 tablet (4 mg total) by mouth every 8 (eight) hours as needed for nausea or vomiting. 11/08/20   Plotnikov, Evie Lacks, MD  oxybutynin (DITROPAN-XL) 5 MG 24 hr tablet Take 1 tablet by mouth daily. 01/24/19   [provider]  Probiotic Product (ALIGN) 4 MG CAPS Take 1 capsule (4 mg total) by mouth daily. 06/21/13   Plotnikov, Evie Lacks, MD  solifenacin (VESICARE) 10 MG tablet Take 1 tablet (10 mg total) by mouth daily. 05/25/20   Plotnikov, Evie Lacks, MD  traZODone (DESYREL) 50 MG tablet TAKE 1 TABLET BY MOUTH AT  BEDTIME 12/24/19   Plotnikov, Evie Lacks, MD  verapamil (CALAN-SR) 240 MG CR tablet Take 1 tablet (240 mg total) by mouth at bedtime. 01/18/21   Plotnikov, Evie Lacks, MD  vitamin B-12 (CYANOCOBALAMIN) 1000 MCG tablet Take 500 mcg by mouth daily.    [provider]    Allergies    Levaquin [levofloxacin]  Review of Systems   Review of Systems  Constitutional:  Negative for activity change, chills, diaphoresis and fever.  Cardiovascular:  Negative for chest pain.  Gastrointestinal:  Positive for nausea. Negative for vomiting.  Musculoskeletal:  Negative for gait problem and neck pain.  Neurological:  Positive for headaches. Negative for  speech difficulty, weakness and light-headedness.  All other systems reviewed and are negative.  Physical Exam Updated Vital Signs BP (!) 155/68   Pulse 71   Temp 97.9 F (36.6 C)   Resp 16   SpO2 97%   Physical Exam Vitals and nursing note reviewed.  Constitutional:      General: She is not in acute distress.    Appearance: Normal appearance. She is not ill-appearing.  HENT:     Head: Normocephalic and atraumatic.     Nose: Nose normal.  Eyes:     General: No visual field deficit or scleral icterus.    Extraocular Movements: Extraocular movements intact.     Conjunctiva/sclera: Conjunctivae normal.  Cardiovascular:     Rate and Rhythm: Normal rate and regular rhythm.     Pulses: Normal pulses.      Heart sounds: Normal heart sounds.  Pulmonary:     Effort: Pulmonary effort is normal. No respiratory distress.     Breath sounds: Normal breath sounds. No wheezing or rales.  Abdominal:     General: There is no distension.     Palpations: Abdomen is soft.     Tenderness: There is no abdominal tenderness.  Musculoskeletal:        General: Normal range of motion.     Cervical back: Normal range of motion.     Comments: Patient is without visual deformity in any of her extremities.  She does have mild bruising to her left upper arm.  She has full range of motion in her left upper extremity with mild tenderness to palpation over her left upper arm.  She has full range of motion in bilateral shoulders, elbows, hips, knees, and ankles.  She is without tenderness to palpation on the above joints as well.  She is without tenderness to palpation over her cervical, thoracic, or lumbar spine.  Skin:    General: Skin is warm and dry.  Neurological:     General: No focal deficit present.     Mental Status: She is alert. Mental status is at baseline.     GCS: GCS eye subscore is 4. GCS verbal subscore is 5. GCS motor subscore is 6.     Cranial Nerves: Cranial nerves 2-12 are intact. No dysarthria or facial asymmetry.     Coordination: Coordination is intact.     Gait: Gait is intact.    ED Results / Procedures / Treatments   Labs (all labs ordered are listed, but only abnormal results are displayed) Labs Reviewed - No data to display  EKG None  Radiology CT Head Wo Contrast  Result Date: 04/16/2021 CLINICAL DATA:  Fall, blunt trauma EXAM: CT HEAD WITHOUT CONTRAST CT CERVICAL SPINE WITHOUT CONTRAST TECHNIQUE: Multidetector CT imaging of the head and cervical spine was performed following the standard protocol without intravenous contrast. Multiplanar CT image reconstructions of the cervical spine were also generated. COMPARISON:  None. FINDINGS: CT HEAD FINDINGS Brain: No acute intracranial  hemorrhage. No focal mass lesion. No CT evidence of acute infarction. No midline shift or mass effect. No hydrocephalus. Basilar cisterns are patent. There are periventricular and subcortical white matter hypodensities. Generalized cortical atrophy. Vascular: No hyperdense vessel or unexpected calcification. Skull: Normal. Negative for fracture or focal lesion. Sinuses/Orbits: Paranasal sinuses and mastoid air cells are clear. Orbits are clear. Other: None. CT CERVICAL SPINE FINDINGS Alignment: Normal alignment of the cervical vertebral bodies. Skull base and vertebrae: Normal craniocervical junction. No loss of vertebral body height or disc  height. Normal facet articulation. No evidence of fracture. Soft tissues and spinal canal: No prevertebral soft tissue swelling. No perispinal or epidural hematoma. Disc levels: Mild anterolisthesis (3 mm) of C4 on C5 is favored degenerative. Mild endplate osteophytosis from C5-C7. Upper chest: Clear Other: None IMPRESSION: *No intracranial trauma. *Mild age-appropriate atrophy. *No cervical spine fracture. *Mild multi level disc osteophytic disease. Electronically Signed   By: Suzy Bouchard [name]   On: 04/16/2021 11:35   CT Cervical Spine Wo Contrast  Result Date: 04/16/2021 CLINICAL DATA:  Fall, blunt trauma EXAM: CT HEAD WITHOUT CONTRAST CT CERVICAL SPINE WITHOUT CONTRAST TECHNIQUE: Multidetector CT imaging of the head and cervical spine was performed following the standard protocol without intravenous contrast. Multiplanar CT image reconstructions of the cervical spine were also generated. COMPARISON:  None. FINDINGS: CT HEAD FINDINGS Brain: No acute intracranial hemorrhage. No focal mass lesion. No CT evidence of acute infarction. No midline shift or mass effect. No hydrocephalus. Basilar cisterns are patent. There are periventricular and subcortical white matter hypodensities. Generalized cortical atrophy. Vascular: No hyperdense vessel or unexpected calcification.  Skull: Normal. Negative for fracture or focal lesion. Sinuses/Orbits: Paranasal sinuses and mastoid air cells are clear. Orbits are clear. Other: None. CT CERVICAL SPINE FINDINGS Alignment: Normal alignment of the cervical vertebral bodies. Skull base and vertebrae: Normal craniocervical junction. No loss of vertebral body height or disc height. Normal facet articulation. No evidence of fracture. Soft tissues and spinal canal: No prevertebral soft tissue swelling. No perispinal or epidural hematoma. Disc levels: Mild anterolisthesis (3 mm) of C4 on C5 is favored degenerative. Mild endplate osteophytosis from C5-C7. Upper chest: Clear Other: None IMPRESSION: *No intracranial trauma. *Mild age-appropriate atrophy. *No cervical spine fracture. *Mild multi level disc osteophytic disease. Electronically Signed   By: Suzy Bouchard [name]   On: 04/16/2021 11:35    Procedures Procedures   Medications Ordered in ED Medications - No data to display  ED Course  I have reviewed the triage vital signs and the nursing notes.  Pertinent labs & imaging results that were available during my care of the patient were reviewed by me and considered in my medical decision making (see chart for details).    MDM Rules/Calculators/A&P                           75 year old female presents today for evaluation of headache 2 days following a fall she had in trying into her house and not clearing 1 step.  Patient was carrying a chair and reports she hit her head on the lower portion of the commode.  Denies any prodromal symptoms, or loss of consciousness.  Patient was able to stand immediately following the incident and go about her day.  She reports she felt a little nauseous immediately following the incident without vomiting or nausea since.  Patient has a normal neurological exam without any focal deficits.  CT head and CT cervical spine without acute findings.  Patient's symptoms are consistent with a concussion.   Supportive care discussed.  Discussed importance of follow-up with her PCP.  Return precautions given.  Patient and daughter voiced understanding and are in agreement with plan.  Final Clinical Impression(s) / ED Diagnoses Final diagnoses:  None    Rx / DC Orders ED Discharge Orders     None        Evlyn Courier, PA-C 04/16/21 1936    Evlyn Courier, PA-C 04/16/21 1937    Wynona Dove  A, DO 04/17/21 1514

## 2021-05-24 ENCOUNTER — Other Ambulatory Visit: Payer: Self-pay | Admitting: Internal Medicine

## 2021-06-17 ENCOUNTER — Other Ambulatory Visit: Payer: Self-pay | Admitting: Internal Medicine

## 2021-07-09 ENCOUNTER — Other Ambulatory Visit: Payer: Self-pay | Admitting: Internal Medicine

## 2021-07-11 ENCOUNTER — Other Ambulatory Visit: Payer: Self-pay | Admitting: Internal Medicine

## 2021-07-15 ENCOUNTER — Emergency Department (HOSPITAL_COMMUNITY)
Admission: EM | Admit: 2021-07-15 | Discharge: 2021-07-15 | Disposition: A | Discharge status: Home / Self Care | Payer: Medicare PPO | Attending: Emergency Medicine | Admitting: Emergency Medicine

## 2021-07-15 ENCOUNTER — Emergency Department (HOSPITAL_COMMUNITY): Payer: Medicare PPO

## 2021-07-15 ENCOUNTER — Encounter (HOSPITAL_COMMUNITY): Payer: Self-pay | Admitting: Emergency Medicine

## 2021-07-15 ENCOUNTER — Other Ambulatory Visit: Payer: Self-pay

## 2021-07-15 DIAGNOSIS — S62307A Unspecified fracture of fifth metacarpal bone, left hand, initial encounter for closed fracture: Secondary | ICD-10-CM | POA: Insufficient documentation

## 2021-07-15 DIAGNOSIS — Y9301 Activity, walking, marching and hiking: Secondary | ICD-10-CM | POA: Insufficient documentation

## 2021-07-15 DIAGNOSIS — S0003XA Contusion of scalp, initial encounter: Secondary | ICD-10-CM | POA: Diagnosis not present

## 2021-07-15 DIAGNOSIS — S62611A Displaced fracture of proximal phalanx of left index finger, initial encounter for closed fracture: Secondary | ICD-10-CM | POA: Diagnosis not present

## 2021-07-15 DIAGNOSIS — W01198A Fall on same level from slipping, tripping and stumbling with subsequent striking against other object, initial encounter: Secondary | ICD-10-CM | POA: Insufficient documentation

## 2021-07-15 DIAGNOSIS — Z23 Encounter for immunization: Secondary | ICD-10-CM | POA: Insufficient documentation

## 2021-07-15 DIAGNOSIS — I10 Essential (primary) hypertension: Secondary | ICD-10-CM

## 2021-07-15 DIAGNOSIS — M79642 Pain in left hand: Secondary | ICD-10-CM | POA: Diagnosis not present

## 2021-07-15 DIAGNOSIS — Z7982 Long term (current) use of aspirin: Secondary | ICD-10-CM | POA: Insufficient documentation

## 2021-07-15 DIAGNOSIS — W010XXA Fall on same level from slipping, tripping and stumbling without subsequent striking against object, initial encounter: Secondary | ICD-10-CM

## 2021-07-15 DIAGNOSIS — S60222A Contusion of left hand, initial encounter: Secondary | ICD-10-CM | POA: Diagnosis present

## 2021-07-15 DIAGNOSIS — S0083XA Contusion of other part of head, initial encounter: Secondary | ICD-10-CM | POA: Insufficient documentation

## 2021-07-15 MED ORDER — ACETAMINOPHEN 500 MG PO TABS
1000.0000 mg | ORAL_TABLET | Freq: Once | ORAL | Status: AC
  Administered 2021-07-15: 1000 mg via ORAL
  Filled 2021-07-15: qty 2

## 2021-07-15 MED ORDER — TETANUS-DIPHTH-ACELL PERTUSSIS 5-2.5-18.5 LF-MCG/0.5 IM SUSY
0.5000 mL | PREFILLED_SYRINGE | Freq: Once | INTRAMUSCULAR | Status: AC
  Administered 2021-07-15: 0.5 mL via INTRAMUSCULAR
  Filled 2021-07-15: qty 0.5

## 2021-07-15 NOTE — ED Triage Notes (Signed)
Patient reports slipping when trying to walk up front doorstep. She reports falling forward and hitting her head on the concrete. Patient reports she takes a baby ASA daily.

## 2021-07-15 NOTE — Discharge Instructions (Addendum)
It was our pleasure to provide your ER care today - we hope that you feel better.  Your xrays show fractures of your left index finger and fifth metacarpal. Keep splint clean/dry. Elevate. Take acetaminophen or ibuprofen as need.  Follow up with hand specialist in the coming week - call office Monday to arrange appointment.   Fall precautions.  Your blood pressure is high today - continue meds, follow up with primary care doctor in the next few weeks.   Return to ER if worse, new symptoms, severe or intractable pain, severe headache, persistent vomiting, or other concern.

## 2021-07-15 NOTE — ED Provider Notes (Signed)
[facility] DEPT Provider Note   CSN: [id number] Arrival date & time: 07/15/21  1655     History  Chief Complaint  Patient presents with   [name]    LEOCADIA IDLEMAN is a 76 y.o. female.  Patient c/o fall just pta today. States was walking towards front door, tripped and fell forward, hitting head on concrete. No loc but was dazed/stunned. C/o pain to forehead/head area post contusion/fall. No neck or back pain. No radicular pain. No numbness or weakness. Denies anticoag use (does take asa). No eye pain or change in vision. Superficial abrasion to face, last tetanus unknown. Denies chest pain or sob. No abd pain or nv. Contusion to left hand - mild pain to area. Denies other pain or injury. No faintness or dizziness prior to fall - felt normal, at baseline.   The history is provided by the patient, a relative and medical records.  Fall Associated symptoms include headaches. Pertinent negatives include no chest pain, no abdominal pain and no shortness of breath.      Home Medications Prior to Admission medications   Medication Sig Start Date End Date Taking? Authorizing Provider  ALPRAZolam Duanne Moron) 0.5 MG tablet Take 1 tablet (0.5 mg total) by mouth 2 (two) times daily as needed. 05/24/20   Plotnikov, Evie Lacks, MD  aspirin 81 MG tablet Take 81 mg by mouth daily.    [provider]  benzonatate (TESSALON) 200 MG capsule Take 1 capsule (200 mg total) by mouth 3 (three) times daily as needed for cough. 03/14/21   Plotnikov, Evie Lacks, MD  buPROPion (WELLBUTRIN XL) 150 MG 24 hr tablet Take 1 tablet (150 mg total) by mouth daily. 01/18/21   Plotnikov, Evie Lacks, MD  cefdinir (OMNICEF) 300 MG capsule Take 1 capsule (300 mg total) by mouth 2 (two) times daily. 03/14/21   Plotnikov, Evie Lacks, MD  Cholecalciferol (VITAMIN D) 2000 UNITS CAPS Take 1 capsule by mouth daily.    [provider]  fish oil-omega-3 fatty acids 1000 MG capsule Take by mouth  daily. Take  600 mg daily    [provider]  flurbiprofen (ANSAID) 100 MG tablet Take 1 tablet earliest onset of migraine. May take every 8 hours as needed, maximum 300mg  in 24h 05/20/18   Jaffe, Adam R, DO  fluticasone (FLONASE) 50 MCG/ACT nasal spray Place 2 sprays into both nostrils daily. 10/07/19   Plotnikov, Evie Lacks, MD  gabapentin (NEURONTIN) 100 MG capsule Take 1 capsule (100 mg total) by mouth at bedtime. 10/07/19   Plotnikov, Evie Lacks, MD  HYDROcodone bit-homatropine (HYCODAN) 5-1.5 MG/5ML syrup Take 5 mLs by mouth every 6 (six) hours as needed for cough. 03/14/21   Plotnikov, Evie Lacks, MD  ibuprofen (ADVIL) 600 MG tablet TAKE 1 TAB BY MOUTH EVERY 8 HRS FOR HEADACHE OR MODERATE PAIN. TAKE 2X DAILY X 2WKS THEN AS NEEDED 10/15/19   Plotnikov, Evie Lacks, MD  linaclotide (LINZESS) 290 MCG CAPS capsule TAKE 1 CAPSULE BY MOUTH  DAILY BEFORE BREAKFAST 10/07/19   Plotnikov, Evie Lacks, MD  lovastatin (MEVACOR) 40 MG tablet Take 1 tablet (40 mg total) by mouth at bedtime. Annual appt due W/LABS must see provider for future refills 07/11/21   Plotnikov, Evie Lacks, MD  Multiple Vitamins-Minerals (OCUVITE PO) Take 2 tablets by mouth daily.    [provider]  omeprazole (PRILOSEC) 40 MG capsule TAKE 1 CAPSULE BY MOUTH TWICE DAILY BEFORE MEALS 07/10/21   Plotnikov, Evie Lacks, MD  ondansetron (  ZOFRAN) 4 MG tablet Take 1 tablet (4 mg total) by mouth every 8 (eight) hours as needed for nausea or vomiting. 11/08/20   Plotnikov, Evie Lacks, MD  oxybutynin (DITROPAN-XL) 5 MG 24 hr tablet Take 1 tablet by mouth daily. 01/24/19   [provider]  Probiotic Product (ALIGN) 4 MG CAPS Take 1 capsule (4 mg total) by mouth daily. 06/21/13   Plotnikov, Evie Lacks, MD  solifenacin (VESICARE) 10 MG tablet TAKE 1 TABLET (10 MG TOTAL) BY MOUTH DAILY. ANNUAL APPT IS DUE MUST SEE PROVIDER FOR FUTURE REFILLS 06/17/21   Plotnikov, Evie Lacks, MD  traZODone (DESYREL) 50 MG tablet TAKE 1 TABLET BY MOUTH AT   BEDTIME 12/24/19   Plotnikov, Evie Lacks, MD  verapamil (CALAN-SR) 240 MG CR tablet TAKE 1 TABLET BY MOUTH AT BEDTIME. 07/10/21   Plotnikov, Evie Lacks, MD  vitamin B-12 (CYANOCOBALAMIN) 1000 MCG tablet Take 500 mcg by mouth daily.    [provider]      Allergies    Levaquin [levofloxacin]    Review of Systems   Review of Systems  Constitutional:  Negative for fever.  HENT:  Negative for nosebleeds.   Eyes:  Negative for pain.  Respiratory:  Negative for shortness of breath.   Cardiovascular:  Negative for chest pain.  Gastrointestinal:  Negative for abdominal pain and vomiting.  Genitourinary:  Negative for flank pain.  Musculoskeletal:  Negative for back pain and neck pain.  Skin:  Positive for wound.  Neurological:  Positive for headaches. Negative for weakness and numbness.  Hematological:  Does not bruise/bleed easily.  Psychiatric/Behavioral:  Negative for confusion.    Physical Exam Updated Vital Signs BP (!) 176/84 (BP Location: Right Arm)    Pulse 95    Temp 97.8 F (36.6 C) (Oral)    Resp 18    SpO2 95%  Physical Exam Vitals and nursing note reviewed.  Constitutional:      Appearance: Normal appearance. She is well-developed.  HENT:     Head:     Comments: Large contusion to forehead and scalp. Superficial abrasion to face/bridge of nose. Facial bones/orbits grossly intact.     Nose: Nose normal.     Mouth/Throat:     Mouth: Mucous membranes are moist.  Eyes:     General: No scleral icterus.    Conjunctiva/sclera: Conjunctivae normal.     Pupils: Pupils are equal, round, and reactive to light.  Neck:     Trachea: No tracheal deviation.  Cardiovascular:     Rate and Rhythm: Normal rate and regular rhythm.     Pulses: Normal pulses.     Heart sounds: Normal heart sounds. No murmur heard.   No friction rub. No gallop.  Pulmonary:     Effort: Pulmonary effort is normal. No respiratory distress.     Breath sounds: Normal breath sounds.  Chest:     Chest  wall: No tenderness.  Abdominal:     General: There is no distension.     Palpations: Abdomen is soft.     Tenderness: There is no abdominal tenderness.  Genitourinary:    Comments: No cva tenderness.  Musculoskeletal:        General: No swelling.     Cervical back: Normal range of motion and neck supple. No rigidity or tenderness. No muscular tenderness.     Comments: Tenderness left hand/distal metacarpal area. Normal movement of digits. Skin intact. No focal wrist of scaphoid tenderness. No other focal bony tenderness noted on  extremity exam.   Skin:    General: Skin is warm and dry.     Findings: No rash.  Neurological:     Mental Status: She is alert.     Comments: Alert, speech normal.  GCS 15. Motor/sens grossly intact bil. Steady gait.   Psychiatric:        Mood and Affect: Mood normal.    ED Results / Procedures / Treatments   Labs (all labs ordered are listed, but only abnormal results are displayed) Labs Reviewed - No data to display  EKG None  Radiology CT Head Wo Contrast  Result Date: 07/15/2021 CLINICAL DATA:  Fall.  Head trauma, minor (Age >= 65y) EXAM: CT HEAD WITHOUT CONTRAST TECHNIQUE: Contiguous axial images were obtained from the base of the skull through the vertex without intravenous contrast. RADIATION DOSE REDUCTION: This exam was performed according to the departmental dose-optimization program which includes automated exposure control, adjustment of the mA and/or kV according to patient size and/or use of iterative reconstruction technique. COMPARISON:  04/16/2021 FINDINGS: Brain: No evidence of acute infarction, hemorrhage, hydrocephalus, extra-axial collection or mass lesion/mass effect. Scattered low-density changes within the periventricular and subcortical white matter compatible with chronic microvascular ischemic change. Mild diffuse cerebral volume loss. Vascular: Atherosclerotic calcifications involving the large vessels of the skull base. No  unexpected hyperdense vessel. Skull: Normal. Negative for fracture or focal lesion. Sinuses/Orbits: Partial left mastoid effusion. Paranasal sinuses are clear. Other: Soft tissue swelling with large left frontal scalp hematoma measuring 3.1 x 1.4 x 2.2 cm. IMPRESSION: 1. No acute intracranial abnormality. 2. Soft tissue swelling with large left frontal scalp hematoma. 3. Partial left mastoid effusion. Electronically Signed   By: Davina Poke D.O.   On: 07/15/2021 18:21   DG Hand Complete Left  Result Date: 07/15/2021 CLINICAL DATA:  Left hand pain after fall. EXAM: LEFT HAND - COMPLETE 3+ VIEW COMPARISON:  May 23, 2017. FINDINGS: Nondisplaced distal fifth metacarpal fracture is noted. Also noted is mildly displaced fracture involving the proximal base of the fourth proximal phalanx. Joint spaces are unremarkable. No soft tissue abnormality is noted. IMPRESSION: Nondisplaced distal fifth metatarsal fracture. Mildly displaced fracture involving proximal base of fourth proximal phalanx. Electronically Signed   By: Marijo Conception [name]   On: 07/15/2021 18:15    Procedures Procedures    Medications Ordered in ED Medications  Tdap (BOOSTRIX) injection 0.5 mL (has no administration in time range)    ED Course/ Medical Decision Making/ A&P                           Medical Decision Making Problems Addressed: Closed fracture of fifth metacarpal bone of left hand, unspecified fracture morphology, initial encounter: acute illness or injury Contusion of forehead, initial encounter: acute illness or injury that poses a threat to life or bodily functions Displaced fracture of proximal phalanx of left index finger, initial encounter for closed fracture: acute illness or injury Fall from slip, trip, or stumble, initial encounter: acute illness or injury with systemic symptoms  Amount and/or Complexity of Data Reviewed Independent Historian:     Details: family member/hx info provided. External  Data Reviewed: notes. Radiology: ordered and independent interpretation performed. Decision-making details documented in ED Course.  Risk OTC drugs. Prescription drug management.   Imaging ordered.   Reviewed nursing notes and prior charts for additional history. External reports reviewed. History from pt and family member.  Tetanus im.  CT reviewed/interpreted by  me - no hem.   Xray reviewed/interpreted by me - left index finger prox phalanx fx and 5th metacarpal fx.   Ulnar gutter splint applied by tech.   Icepack to sore area.  Acetaminophen po.   Discussed xrays w pt.   Pt appears stable for d/c.  Rec hand surgery f/u.  Return precautions provided.           Final Clinical Impression(s) / ED Diagnoses Final diagnoses:  None    Rx / DC Orders ED Discharge Orders     None         Lajean Saver, MD 07/15/21 1859

## 2021-07-15 NOTE — Progress Notes (Signed)
Orthopedic Tech Progress Note Patient Details:  [name] Nov 01, 1945 [id number]  Ortho Devices Type of Ortho Device: Ace wrap, Ulna gutter splint Ortho Device/Splint Location: left Ortho Device/Splint Interventions: Application   Post Interventions Patient Tolerated: Well Instructions Provided: Care of device  [name] 07/15/2021, 7:07 PM

## 2021-07-20 DIAGNOSIS — S62615A Displaced fracture of proximal phalanx of left ring finger, initial encounter for closed fracture: Secondary | ICD-10-CM | POA: Diagnosis not present

## 2021-07-20 DIAGNOSIS — S62357A Nondisplaced fracture of shaft of fifth metacarpal bone, left hand, initial encounter for closed fracture: Secondary | ICD-10-CM | POA: Diagnosis not present

## 2021-08-01 DIAGNOSIS — S62615D Displaced fracture of proximal phalanx of left ring finger, subsequent encounter for fracture with routine healing: Secondary | ICD-10-CM | POA: Diagnosis not present

## 2021-08-01 DIAGNOSIS — S62357A Nondisplaced fracture of shaft of fifth metacarpal bone, left hand, initial encounter for closed fracture: Secondary | ICD-10-CM | POA: Diagnosis not present

## 2021-08-04 ENCOUNTER — Encounter: Payer: Self-pay | Admitting: Internal Medicine

## 2021-08-04 ENCOUNTER — Other Ambulatory Visit: Payer: Self-pay

## 2021-08-04 ENCOUNTER — Ambulatory Visit: Payer: Medicare PPO | Admitting: Internal Medicine

## 2021-08-04 DIAGNOSIS — S060X0D Concussion without loss of consciousness, subsequent encounter: Secondary | ICD-10-CM

## 2021-08-04 DIAGNOSIS — I2510 Atherosclerotic heart disease of native coronary artery without angina pectoris: Secondary | ICD-10-CM

## 2021-08-04 DIAGNOSIS — J323 Chronic sphenoidal sinusitis: Secondary | ICD-10-CM

## 2021-08-04 DIAGNOSIS — R053 Chronic cough: Secondary | ICD-10-CM | POA: Diagnosis not present

## 2021-08-04 DIAGNOSIS — E538 Deficiency of other specified B group vitamins: Secondary | ICD-10-CM | POA: Diagnosis not present

## 2021-08-04 DIAGNOSIS — E785 Hyperlipidemia, unspecified: Secondary | ICD-10-CM | POA: Diagnosis not present

## 2021-08-04 DIAGNOSIS — S060XAA Concussion with loss of consciousness status unknown, initial encounter: Secondary | ICD-10-CM | POA: Insufficient documentation

## 2021-08-04 DIAGNOSIS — I2583 Coronary atherosclerosis due to lipid rich plaque: Secondary | ICD-10-CM

## 2021-08-04 MED ORDER — GABAPENTIN 100 MG PO CAPS: 100.0000 mg | ORAL_CAPSULE | Freq: Every day | ORAL | 3 refills | Status: DC

## 2021-08-04 MED ORDER — VERAPAMIL HCL ER 240 MG PO TBCR: 240.0000 mg | EXTENDED_RELEASE_TABLET | Freq: Every day | ORAL | 3 refills | Status: DC

## 2021-08-04 MED ORDER — OMEPRAZOLE 40 MG PO CPDR: 40.0000 mg | DELAYED_RELEASE_CAPSULE | Freq: Two times a day (BID) | ORAL | 3 refills | Status: DC

## 2021-08-04 MED ORDER — HYDROCODONE BIT-HOMATROP MBR 5-1.5 MG/5ML PO SOLN: 5.0000 mL | Freq: Four times a day (QID) | ORAL | 0 refills | Status: DC | PRN

## 2021-08-04 MED ORDER — ALPRAZOLAM 0.5 MG PO TABS: 0.5000 mg | ORAL_TABLET | Freq: Two times a day (BID) | ORAL | 3 refills | Status: DC | PRN

## 2021-08-04 MED ORDER — CEFDINIR 300 MG PO CAPS: 300.0000 mg | ORAL_CAPSULE | Freq: Two times a day (BID) | ORAL | 0 refills | Status: DC

## 2021-08-04 MED ORDER — LOVASTATIN 40 MG PO TABS: 40.0000 mg | ORAL_TABLET | Freq: Every day | ORAL | 3 refills | Status: DC

## 2021-08-04 NOTE — Assessment & Plan Note (Signed)
On B12 

## 2021-08-04 NOTE — Assessment & Plan Note (Signed)
Lovastatin  ASA

## 2021-08-04 NOTE — Assessment & Plan Note (Signed)
  °  1/23 Head CT IMPRESSION: 1. No acute intracranial abnormality. 2. Soft tissue swelling with large left frontal scalp hematoma. 3. Partial left mastoid effusion. Electronically Signed   By: Davina Poke D.O.   On: 07/15/2021 18:21

## 2021-08-04 NOTE — Progress Notes (Signed)
depression  Subjective:  Patient ID: [name], female    DOB: 06/13/1946  Age: 76 y.o. MRN: [id number]  CC: No chief complaint on file.   HPI [name] presents for depression S/p fall on the porch - forehead hematoma, no LOC . Pt had HAs x 1 week C/o cough F/u HTN   Outpatient Medications Prior to Visit  Medication Sig Dispense Refill   aspirin 81 MG tablet Take 81 mg by mouth daily.     benzonatate (TESSALON) 200 MG capsule Take 1 capsule (200 mg total) by mouth 3 (three) times daily as needed for cough. 60 capsule 1   buPROPion (WELLBUTRIN XL) 150 MG 24 hr tablet Take 1 tablet (150 mg total) by mouth daily. 90 tablet 1   Cholecalciferol (VITAMIN D) 2000 UNITS CAPS Take 1 capsule by mouth daily.     fish oil-omega-3 fatty acids 1000 MG capsule Take by mouth daily. Take  600 mg daily     flurbiprofen (ANSAID) 100 MG tablet Take 1 tablet earliest onset of migraine. May take every 8 hours as needed, maximum 300mg  in 24h 30 tablet 3   fluticasone (FLONASE) 50 MCG/ACT nasal spray Place 2 sprays into both nostrils daily. 48 g 3   ibuprofen (ADVIL) 600 MG tablet TAKE 1 TAB BY MOUTH EVERY 8 HRS FOR HEADACHE OR MODERATE PAIN. TAKE 2X DAILY X 2WKS THEN AS NEEDED 60 tablet 0   linaclotide (LINZESS) 290 MCG CAPS capsule TAKE 1 CAPSULE BY MOUTH  DAILY BEFORE BREAKFAST 90 capsule 3   Multiple Vitamins-Minerals (OCUVITE PO) Take 2 tablets by mouth daily.     ondansetron (ZOFRAN) 4 MG tablet Take 1 tablet (4 mg total) by mouth every 8 (eight) hours as needed for nausea or vomiting. 20 tablet 0   oxybutynin (DITROPAN-XL) 5 MG 24 hr tablet Take 1 tablet by mouth daily.     Probiotic Product (ALIGN) 4 MG CAPS Take 1 capsule (4 mg total) by mouth daily. 30 capsule 1   solifenacin (VESICARE) 10 MG tablet TAKE 1 TABLET (10 MG TOTAL) BY MOUTH DAILY. ANNUAL APPT IS DUE MUST SEE PROVIDER FOR FUTURE REFILLS 90 tablet 3   traZODone (DESYREL) 50 MG tablet TAKE 1 TABLET BY MOUTH AT  BEDTIME 90 tablet 3    vitamin B-12 (CYANOCOBALAMIN) 1000 MCG tablet Take 500 mcg by mouth daily.     ALPRAZolam (XANAX) 0.5 MG tablet Take 1 tablet (0.5 mg total) by mouth 2 (two) times daily as needed. 60 tablet 3   cefdinir (OMNICEF) 300 MG capsule Take 1 capsule (300 mg total) by mouth 2 (two) times daily. 20 capsule 0   gabapentin (NEURONTIN) 100 MG capsule Take 1 capsule (100 mg total) by mouth at bedtime. 90 capsule 3   HYDROcodone bit-homatropine (HYCODAN) 5-1.5 MG/5ML syrup Take 5 mLs by mouth every 6 (six) hours as needed for cough. 240 mL 0   lovastatin (MEVACOR) 40 MG tablet Take 1 tablet (40 mg total) by mouth at bedtime. Annual appt due W/LABS must see provider for future refills 90 tablet 0   omeprazole (PRILOSEC) 40 MG capsule TAKE 1 CAPSULE BY MOUTH TWICE DAILY BEFORE MEALS 180 capsule 1   verapamil (CALAN-SR) 240 MG CR tablet TAKE 1 TABLET BY MOUTH AT BEDTIME. 90 tablet 1   No facility-administered medications prior to visit.    ROS: Review of Systems  Constitutional:  Negative for activity change, appetite change, chills, fatigue and unexpected weight change.  HENT:  Negative for  congestion, mouth sores and sinus pressure.   Eyes:  Negative for visual disturbance.  Respiratory:  Negative for cough and chest tightness.   Gastrointestinal:  Negative for abdominal pain and nausea.  Genitourinary:  Negative for difficulty urinating, frequency and vaginal pain.  Musculoskeletal:  Negative for back pain and gait problem.  Skin:  Negative for pallor and rash.  Neurological:  Negative for dizziness, tremors, weakness, numbness and headaches.  Psychiatric/Behavioral:  Negative for confusion, sleep disturbance and suicidal ideas.    Objective:  BP 130/78 (BP Location: Left Arm, Patient Position: Sitting, Cuff Size: Large)    Pulse 64    Temp 98.5 F (36.9 C) (Oral)    Ht 5' (1.524 m)    Wt 176 lb (79.8 kg)    SpO2 98%    BMI 34.37 kg/m   BP Readings from Last 3 Encounters:  08/04/21 130/78   07/15/21 (!) 155/84  04/16/21 (!) 177/78    Wt Readings from Last 3 Encounters:  08/04/21 176 lb (79.8 kg)  03/14/21 178 lb (80.7 kg)  07/18/20 201 lb 3.2 oz (91.3 kg)    Physical Exam Constitutional:      General: She is not in acute distress.    Appearance: She is well-developed. She is obese.  HENT:     Head: Normocephalic.     Right Ear: External ear normal.     Left Ear: External ear normal.     Nose: Nose normal.  Eyes:     General:        Right eye: No discharge.        Left eye: No discharge.     Conjunctiva/sclera: Conjunctivae normal.     Pupils: Pupils are equal, round, and reactive to light.  Neck:     Thyroid: No thyromegaly.     Vascular: No JVD.     Trachea: No tracheal deviation.  Cardiovascular:     Rate and Rhythm: Normal rate and regular rhythm.     Heart sounds: Normal heart sounds.  Pulmonary:     Effort: No respiratory distress.     Breath sounds: No stridor. No wheezing.  Abdominal:     General: Bowel sounds are normal. There is no distension.     Palpations: Abdomen is soft. There is no mass.     Tenderness: There is no abdominal tenderness. There is no guarding or rebound.  Musculoskeletal:        General: No tenderness.     Cervical back: Normal range of motion and neck supple. No rigidity.  Lymphadenopathy:     Cervical: No cervical adenopathy.  Skin:    Findings: No erythema or rash.  Neurological:     Mental Status: She is oriented to person, place, and time.     Cranial Nerves: No cranial nerve deficit.     Motor: No abnormal muscle tone.     Coordination: Coordination normal.     Deep Tendon Reflexes: Reflexes normal.  Psychiatric:        Behavior: Behavior normal.        Thought Content: Thought content normal.        Judgment: Judgment normal.     A total time of 45 minutes was spent preparing to see the patient, reviewing tests, x-rays, operative reports and other medical records.  Also, obtaining history and performing  comprehensive physical exam.  Additionally, counseling the patient regarding the above listed issues.   Finally, documenting clinical information in the health records, coordination of  care, educating the patient on concussion, mastoid infection. It is a complex case.  Lab Results  Component Value Date   WBC 8.0 05/24/2020   HGB 12.8 05/24/2020   HCT 39.1 05/24/2020   PLT 219.0 05/24/2020   GLUCOSE 94 05/24/2020   CHOL 174 05/24/2020   TRIG 127.0 05/24/2020   HDL 41.20 05/24/2020   LDLDIRECT 111.0 02/04/2019   LDLCALC 107 (H) 05/24/2020   ALT 8 05/24/2020   AST 11 05/24/2020   NA 143 05/24/2020   K 4.3 05/24/2020   CL 106 05/24/2020   CREATININE 0.80 05/24/2020   BUN 13 05/24/2020   CO2 30 05/24/2020   TSH 1.22 05/24/2020   INR 0.99 01/26/2017    CT Head Wo Contrast  Result Date: 07/15/2021 CLINICAL DATA:  Fall.  Head trauma, minor (Age >= 65y) EXAM: CT HEAD WITHOUT CONTRAST TECHNIQUE: Contiguous axial images were obtained from the base of the skull through the vertex without intravenous contrast. RADIATION DOSE REDUCTION: This exam was performed according to the departmental dose-optimization program which includes automated exposure control, adjustment of the mA and/or kV according to patient size and/or use of iterative reconstruction technique. COMPARISON:  04/16/2021 FINDINGS: Brain: No evidence of acute infarction, hemorrhage, hydrocephalus, extra-axial collection or mass lesion/mass effect. Scattered low-density changes within the periventricular and subcortical white matter compatible with chronic microvascular ischemic change. Mild diffuse cerebral volume loss. Vascular: Atherosclerotic calcifications involving the large vessels of the skull base. No unexpected hyperdense vessel. Skull: Normal. Negative for fracture or focal lesion. Sinuses/Orbits: Partial left mastoid effusion. Paranasal sinuses are clear. Other: Soft tissue swelling with large left frontal scalp hematoma  measuring 3.1 x 1.4 x 2.2 cm. IMPRESSION: 1. No acute intracranial abnormality. 2. Soft tissue swelling with large left frontal scalp hematoma. 3. Partial left mastoid effusion. Electronically Signed   By: Davina Poke D.O.   On: 07/15/2021 18:21   DG Hand Complete Left  Result Date: 07/15/2021 CLINICAL DATA:  Left hand pain after fall. EXAM: LEFT HAND - COMPLETE 3+ VIEW COMPARISON:  May 23, 2017. FINDINGS: Nondisplaced distal fifth metacarpal fracture is noted. Also noted is mildly displaced fracture involving the proximal base of the fourth proximal phalanx. Joint spaces are unremarkable. No soft tissue abnormality is noted. IMPRESSION: Nondisplaced distal fifth metatarsal fracture. Mildly displaced fracture involving proximal base of fourth proximal phalanx. Electronically Signed   By: Marijo Conception [name]   On: 07/15/2021 18:15    Assessment & Plan:   Problem List Items Addressed This Visit     B12 deficiency    On B12      Concussion    Rest more      Coronary atherosclerosis    Lovastatin  ASA      Relevant Medications   lovastatin (MEVACOR) 40 MG tablet   verapamil (CALAN-SR) 240 MG CR tablet   Cough    1/23 Head CT IMPRESSION: 1. No acute intracranial abnormality. 2. Soft tissue swelling with large left frontal scalp hematoma. 3. Partial left mastoid effusion.  Electronically Signed   By: Davina Poke D.O.   On: 07/15/2021 18:21  Not better x6 mo. Abx helped. Will repeat plus probiotic Hycodan Cefdinir      Dyslipidemia    Lovastatin  ASA      Relevant Medications   lovastatin (MEVACOR) 40 MG tablet   Sinusitis, chronic      1/23 Head CT IMPRESSION: 1. No acute intracranial abnormality. 2. Soft tissue swelling with large left frontal scalp  hematoma. 3. Partial left mastoid effusion. Electronically Signed   By: Davina Poke D.O.   On: 07/15/2021 18:21      Relevant Medications   cefdinir (OMNICEF) 300 MG capsule   HYDROcodone  bit-homatropine (HYCODAN) 5-1.5 MG/5ML syrup      Meds ordered this encounter  Medications   cefdinir (OMNICEF) 300 MG capsule    Sig: Take 1 capsule (300 mg total) by mouth 2 (two) times daily.    Dispense:  28 capsule    Refill:  0   HYDROcodone bit-homatropine (HYCODAN) 5-1.5 MG/5ML syrup    Sig: Take 5 mLs by mouth every 6 (six) hours as needed for cough.    Dispense:  240 mL    Refill:  0   lovastatin (MEVACOR) 40 MG tablet    Sig: Take 1 tablet (40 mg total) by mouth at bedtime. Annual appt due W/LABS must see provider for future refills    Dispense:  90 tablet    Refill:  3   verapamil (CALAN-SR) 240 MG CR tablet    Sig: Take 1 tablet (240 mg total) by mouth at bedtime.    Dispense:  90 tablet    Refill:  3   omeprazole (PRILOSEC) 40 MG capsule    Sig: Take 1 capsule (40 mg total) by mouth in the morning and at bedtime.    Dispense:  180 capsule    Refill:  3   gabapentin (NEURONTIN) 100 MG capsule    Sig: Take 1 capsule (100 mg total) by mouth at bedtime.    Dispense:  90 capsule    Refill:  3   ALPRAZolam (XANAX) 0.5 MG tablet    Sig: Take 1 tablet (0.5 mg total) by mouth 2 (two) times daily as needed.    Dispense:  60 tablet    Refill:  3      Follow-up: Return in about 3 months (around 11/01/2021) for a follow-up visit.  Walker Kehr, MD

## 2021-08-04 NOTE — Assessment & Plan Note (Signed)
Rest more

## 2021-08-04 NOTE — Assessment & Plan Note (Addendum)
1/23 Head CT IMPRESSION: 1. No acute intracranial abnormality. 2. Soft tissue swelling with large left frontal scalp hematoma. 3. Partial left mastoid effusion. Electronically Signed   By: Davina Poke D.O.   On: 07/15/2021 18:21  Not better x6 mo. Abx helped. Will repeat plus probiotic Hycodan Cefdinir

## 2021-08-04 NOTE — Patient Instructions (Signed)
Concussion, Adult A concussion is a brain injury from a hard, direct hit (trauma) to the head or body. This direct hit causes the brain to shake quickly back and forth inside the skull. This can damage brain cells and cause chemical changes in the brain. A concussion may also be known as a mild traumatic brain injury (TBI). Concussions are usually not life-threatening, but the effects of a concussion can be serious. If you have a concussion, you should be very careful to avoid having a second concussion. What are the causes? This condition is caused by: A direct hit to your head, such as: Running into another player during a game. Being hit in a fight. Hitting your head on a hard surface. Sudden movement of your body that causes your brain to move back and forth inside the skull, such as in a car crash. What are the signs or symptoms? The signs of a concussion can be hard to notice. Early on, they may be missed by you, family members, and health care providers. You may look fine on the outside but may act or feel differently. Every head injury is different. Symptoms are usually temporary but may last for days, weeks, or even months. Some symptoms appear right away, but other symptoms may not show up for hours or days. If your symptoms last longer than normal, you may have post-concussion syndrome. Physical symptoms Headaches. Dizziness and problems with coordination or balance. Sensitivity to light or noise. Nausea or vomiting. Tiredness (fatigue). Vision or hearing problems. Changes in eating or sleeping patterns. Seizure. Mental and emotional symptoms Irritability or mood changes. Memory problems. Trouble concentrating, organizing, or making decisions. Slowness in thinking, acting or reacting, speaking, or reading. Anxiety or depression. How is this diagnosed? This condition is diagnosed based on: Your symptoms. A description of your injury. You may also have tests,  including: Imaging tests, such as a CT scan or an MRI. Neuropsychological tests. These measure your thinking, understanding, learning, and remembering abilities. How is this treated? Treatment for this condition includes: Stopping sports or activity if you are injured. If you hit your head or show signs of concussion: Do not return to sports or activities the same day. Get checked by a health care provider before you return to your activities. Physical and mental rest and careful observation, usually at home. Gradually return to your normal activities. Medicines to help with symptoms such as headaches, nausea, or difficulty sleeping. Avoid taking opioid pain medicine while recovering from a concussion. Avoiding alcohol and drugs. These may slow your recovery and can put you at risk of further injury. Referral to a concussion clinic or rehabilitation center. Recovery from a concussion can take time. How fast you recover depends on many factors. Return to activities only when: Your symptoms are completely gone. Your health care provider says that it is safe. Follow these instructions at home: Activity Limit activities that require a lot of thought or concentration, such as: Doing homework or job-related work. Watching TV. Working on the computer or phone. Playing memory games and puzzles. Rest. Rest helps your brain heal. Make sure you: Get plenty of sleep. Most adults should get 7-9 hours of sleep each night. Rest during the day. Take naps or rest breaks when you feel tired. Avoid physical activity like exercise until your health care provider says it is safe. Stop any activity that worsens symptoms. Do not do high-risk activities that could cause a second concussion, such as riding a bike or playing sports.  Ask your health care provider when you can return to your normal activities, such as school, work, athletics, and driving. Your ability to react may be slower after a brain injury.  Never do these activities if you are dizzy. Your health care provider will likely give you a plan for gradually returning to activities. General instructions  Take over-the-counter and prescription medicines only as told by your health care provider. Some medicines, such as blood thinners (anticoagulants) and aspirin, may increase the risk for complications, such as bleeding. Do not drink alcohol until your health care provider says you can. Watch your symptoms and tell others around you to do the same. Complications sometimes occur after a concussion. Older adults with a brain injury may have a higher risk of serious complications. Tell your work Freight forwarder, teachers, Government social research officer, school counselor, coach, or Product/process development scientist about your injury, symptoms, and restrictions. Keep all follow-up visits as told by your health care provider. This is important. How is this prevented? Avoiding another brain injury is very important. In rare cases, another injury can lead to permanent brain damage, brain swelling, or death. The risk of this is greatest during the first 7-10 days after a head injury. Avoid injuries by: Stopping activities that could lead to a second concussion, such as contact or recreational sports, until your health care provider says it is okay. Taking these actions once you have returned to sports or activities: Avoiding plays or moves that can cause you to crash into another person. This is how most concussions occur. Following the rules and being respectful of other players. Do not engage in violent or illegal plays. Getting regular exercise that includes strength and balance training. Wearing a properly fitting helmet during sports, biking, or other activities. Helmets can help protect you from serious skull and brain injuries, but they may not protect you from a concussion. Even when wearing a helmet, you should avoid being hit in the head. Contact a health care provider if: Your  symptoms do not improve. You have new symptoms. You have another injury. Get help right away if: You have new or worsening physical symptoms, such as: A severe or worsening headache. Weakness or numbness in any part of your body, slurred speech, vision changes, or confusion. Your coordination gets worse. Vomiting repeatedly. You have a seizure. You have unusual behavior changes. You lose consciousness, are sleepier than normal, or are difficult to wake up. These symptoms may represent a serious problem that is an emergency. Do not wait to see if the symptoms will go away. Get medical help right away. Call your local emergency services (911 in the [place] Do not drive yourself to the hospital. Summary A concussion is a brain injury that results from a hard, direct hit (trauma) to your head or body. You may have imaging tests and neuropsychological tests to diagnose a concussion. Treatment for this condition includes physical and mental rest and careful observation. Ask your health care provider when you can return to your normal activities, such as school, work, athletics, and driving. Get help right away if you have a severe headache, weakness in any part of the body, seizures, behavior changes, changes in vision, or if you are confused or sleepier than normal. This information is not intended to replace advice given to you by your health care provider. Make sure you discuss any questions you have with your health care provider. Document Revised: 08/18/2020 Document Reviewed: 08/18/2020 Elsevier Patient Education  [place].

## 2021-08-31 ENCOUNTER — Emergency Department (HOSPITAL_COMMUNITY): Payer: Medicare PPO

## 2021-08-31 ENCOUNTER — Other Ambulatory Visit: Payer: Self-pay

## 2021-08-31 ENCOUNTER — Encounter (HOSPITAL_COMMUNITY): Payer: Self-pay

## 2021-08-31 ENCOUNTER — Inpatient Hospital Stay (HOSPITAL_COMMUNITY)
Admission: EM | Admit: 2021-08-31 | Discharge: 2021-09-05 | DRG: 097 | Disposition: A | Discharge status: Home / Self Care | Payer: Medicare PPO | Attending: Family Medicine | Admitting: Family Medicine

## 2021-08-31 DIAGNOSIS — Z9851 Tubal ligation status: Secondary | ICD-10-CM

## 2021-08-31 DIAGNOSIS — E785 Hyperlipidemia, unspecified: Secondary | ICD-10-CM

## 2021-08-31 DIAGNOSIS — G030 Nonpyogenic meningitis: Secondary | ICD-10-CM

## 2021-08-31 DIAGNOSIS — M1990 Unspecified osteoarthritis, unspecified site: Secondary | ICD-10-CM | POA: Diagnosis present

## 2021-08-31 DIAGNOSIS — Z79899 Other long term (current) drug therapy: Secondary | ICD-10-CM

## 2021-08-31 DIAGNOSIS — K5790 Diverticulosis of intestine, part unspecified, without perforation or abscess without bleeding: Secondary | ICD-10-CM | POA: Diagnosis present

## 2021-08-31 DIAGNOSIS — Z881 Allergy status to other antibiotic agents status: Secondary | ICD-10-CM

## 2021-08-31 DIAGNOSIS — K219 Gastro-esophageal reflux disease without esophagitis: Secondary | ICD-10-CM | POA: Diagnosis present

## 2021-08-31 DIAGNOSIS — I639 Cerebral infarction, unspecified: Secondary | ICD-10-CM | POA: Diagnosis not present

## 2021-08-31 DIAGNOSIS — G9341 Metabolic encephalopathy: Secondary | ICD-10-CM | POA: Diagnosis not present

## 2021-08-31 DIAGNOSIS — Z90710 Acquired absence of both cervix and uterus: Secondary | ICD-10-CM

## 2021-08-31 DIAGNOSIS — K5901 Slow transit constipation: Secondary | ICD-10-CM

## 2021-08-31 DIAGNOSIS — Z980 Intestinal bypass and anastomosis status: Secondary | ICD-10-CM | POA: Diagnosis not present

## 2021-08-31 DIAGNOSIS — R835 Abnormal microbiological findings in cerebrospinal fluid: Secondary | ICD-10-CM | POA: Diagnosis not present

## 2021-08-31 DIAGNOSIS — R4182 Altered mental status, unspecified: Secondary | ICD-10-CM | POA: Diagnosis not present

## 2021-08-31 DIAGNOSIS — F419 Anxiety disorder, unspecified: Secondary | ICD-10-CM | POA: Diagnosis present

## 2021-08-31 DIAGNOSIS — G9340 Encephalopathy, unspecified: Secondary | ICD-10-CM | POA: Diagnosis not present

## 2021-08-31 DIAGNOSIS — M810 Age-related osteoporosis without current pathological fracture: Secondary | ICD-10-CM | POA: Diagnosis present

## 2021-08-31 DIAGNOSIS — Z20822 Contact with and (suspected) exposure to covid-19: Secondary | ICD-10-CM | POA: Diagnosis present

## 2021-08-31 DIAGNOSIS — Z888 Allergy status to other drugs, medicaments and biological substances status: Secondary | ICD-10-CM

## 2021-08-31 DIAGNOSIS — J329 Chronic sinusitis, unspecified: Secondary | ICD-10-CM | POA: Diagnosis present

## 2021-08-31 DIAGNOSIS — E876 Hypokalemia: Secondary | ICD-10-CM | POA: Diagnosis present

## 2021-08-31 DIAGNOSIS — J029 Acute pharyngitis, unspecified: Secondary | ICD-10-CM | POA: Diagnosis not present

## 2021-08-31 DIAGNOSIS — I672 Cerebral atherosclerosis: Secondary | ICD-10-CM | POA: Diagnosis not present

## 2021-08-31 DIAGNOSIS — Z7982 Long term (current) use of aspirin: Secondary | ICD-10-CM

## 2021-08-31 DIAGNOSIS — R6520 Severe sepsis without septic shock: Secondary | ICD-10-CM | POA: Diagnosis not present

## 2021-08-31 DIAGNOSIS — I493 Ventricular premature depolarization: Secondary | ICD-10-CM | POA: Diagnosis present

## 2021-08-31 DIAGNOSIS — E538 Deficiency of other specified B group vitamins: Secondary | ICD-10-CM | POA: Diagnosis present

## 2021-08-31 DIAGNOSIS — R410 Disorientation, unspecified: Secondary | ICD-10-CM | POA: Diagnosis present

## 2021-08-31 DIAGNOSIS — R030 Elevated blood-pressure reading, without diagnosis of hypertension: Secondary | ICD-10-CM | POA: Diagnosis not present

## 2021-08-31 DIAGNOSIS — Z96651 Presence of right artificial knee joint: Secondary | ICD-10-CM | POA: Diagnosis present

## 2021-08-31 DIAGNOSIS — A419 Sepsis, unspecified organism: Secondary | ICD-10-CM

## 2021-08-31 DIAGNOSIS — I10 Essential (primary) hypertension: Secondary | ICD-10-CM

## 2021-08-31 DIAGNOSIS — M797 Fibromyalgia: Secondary | ICD-10-CM | POA: Diagnosis present

## 2021-08-31 DIAGNOSIS — K2270 Barrett's esophagus without dysplasia: Secondary | ICD-10-CM | POA: Diagnosis present

## 2021-08-31 DIAGNOSIS — R6510 Systemic inflammatory response syndrome (SIRS) of non-infectious origin without acute organ dysfunction: Secondary | ICD-10-CM | POA: Diagnosis present

## 2021-08-31 DIAGNOSIS — I2510 Atherosclerotic heart disease of native coronary artery without angina pectoris: Secondary | ICD-10-CM | POA: Diagnosis present

## 2021-08-31 DIAGNOSIS — F32A Depression, unspecified: Secondary | ICD-10-CM | POA: Diagnosis present

## 2021-08-31 DIAGNOSIS — K5900 Constipation, unspecified: Secondary | ICD-10-CM | POA: Diagnosis present

## 2021-08-31 DIAGNOSIS — R296 Repeated falls: Secondary | ICD-10-CM | POA: Diagnosis not present

## 2021-08-31 DIAGNOSIS — R008 Other abnormalities of heart beat: Secondary | ICD-10-CM | POA: Diagnosis not present

## 2021-08-31 DIAGNOSIS — R000 Tachycardia, unspecified: Secondary | ICD-10-CM | POA: Diagnosis not present

## 2021-08-31 DIAGNOSIS — F418 Other specified anxiety disorders: Secondary | ICD-10-CM | POA: Diagnosis not present

## 2021-08-31 DIAGNOSIS — G039 Meningitis, unspecified: Secondary | ICD-10-CM

## 2021-08-31 DIAGNOSIS — J323 Chronic sphenoidal sinusitis: Secondary | ICD-10-CM

## 2021-08-31 DIAGNOSIS — R509 Fever, unspecified: Principal | ICD-10-CM

## 2021-08-31 DIAGNOSIS — B349 Viral infection, unspecified: Secondary | ICD-10-CM

## 2021-08-31 DIAGNOSIS — K7689 Other specified diseases of liver: Secondary | ICD-10-CM | POA: Diagnosis not present

## 2021-08-31 DIAGNOSIS — E860 Dehydration: Secondary | ICD-10-CM | POA: Diagnosis present

## 2021-08-31 DIAGNOSIS — G928 Other toxic encephalopathy: Secondary | ICD-10-CM | POA: Diagnosis present

## 2021-08-31 DIAGNOSIS — K5909 Other constipation: Secondary | ICD-10-CM | POA: Diagnosis not present

## 2021-08-31 DIAGNOSIS — G43909 Migraine, unspecified, not intractable, without status migrainosus: Secondary | ICD-10-CM | POA: Diagnosis present

## 2021-08-31 DIAGNOSIS — K5730 Diverticulosis of large intestine without perforation or abscess without bleeding: Secondary | ICD-10-CM | POA: Diagnosis present

## 2021-08-31 DIAGNOSIS — Z9181 History of falling: Secondary | ICD-10-CM

## 2021-08-31 DIAGNOSIS — Z8719 Personal history of other diseases of the digestive system: Secondary | ICD-10-CM | POA: Diagnosis not present

## 2021-08-31 DIAGNOSIS — Z6834 Body mass index (BMI) 34.0-34.9, adult: Secondary | ICD-10-CM

## 2021-08-31 DIAGNOSIS — Z8249 Family history of ischemic heart disease and other diseases of the circulatory system: Secondary | ICD-10-CM

## 2021-08-31 LAB — CBC
HCT: 37.7 % (ref 36.0–46.0)
Hemoglobin: 12.1 g/dL (ref 12.0–15.0)
MCH: 29.4 pg (ref 26.0–34.0)
MCHC: 32.1 g/dL (ref 30.0–36.0)
MCV: 91.7 fL (ref 80.0–100.0)
Platelets: 215 10*3/uL (ref 150–400)
RBC: 4.11 MIL/uL (ref 3.87–5.11)
RDW: 13.9 % (ref 11.5–15.5)
WBC: 16.6 10*3/uL — ABNORMAL HIGH (ref 4.0–10.5)
nRBC: 0 % (ref 0.0–0.2)

## 2021-08-31 LAB — COMPREHENSIVE METABOLIC PANEL
ALT: 8 U/L (ref 0–44)
AST: 13 U/L — ABNORMAL LOW (ref 15–41)
Albumin: 3.8 g/dL (ref 3.5–5.0)
Alkaline Phosphatase: 74 U/L (ref 38–126)
Anion gap: 7 (ref 5–15)
BUN: 13 mg/dL (ref 8–23)
CO2: 25 mmol/L (ref 22–32)
Calcium: 8.5 mg/dL — ABNORMAL LOW (ref 8.9–10.3)
Chloride: 104 mmol/L (ref 98–111)
Creatinine, Ser: 0.81 mg/dL (ref 0.44–1.00)
GFR, Estimated: >60 mL/min (ref >60)
Glucose, Bld: 109 mg/dL — ABNORMAL HIGH (ref 70–99)
Potassium: 3.5 mmol/L (ref 3.5–5.1)
Sodium: 136 mmol/L (ref 135–145)
Total Bilirubin: 0.7 mg/dL (ref 0.3–1.2)
Total Protein: 6.7 g/dL (ref 6.5–8.1)

## 2021-08-31 LAB — LIPID PANEL
Cholesterol: 141 mg/dL (ref 0–200)
HDL: 48 mg/dL (ref >40)
LDL Cholesterol: 82 mg/dL (ref 0–99)
Total CHOL/HDL Ratio: 2.9 RATIO
Triglycerides: 54 mg/dL (ref <150)
VLDL: 11 mg/dL (ref 0–40)

## 2021-08-31 LAB — I-STAT CHEM 8, ED
BUN: 11 mg/dL (ref 8–23)
Calcium, Ion: 1.14 mmol/L — ABNORMAL LOW (ref 1.15–1.40)
Chloride: 102 mmol/L (ref 98–111)
Creatinine, Ser: 0.7 mg/dL (ref 0.44–1.00)
Glucose, Bld: 107 mg/dL — ABNORMAL HIGH (ref 70–99)
HCT: 37 % (ref 36.0–46.0)
Hemoglobin: 12.6 g/dL (ref 12.0–15.0)
Potassium: 3.6 mmol/L (ref 3.5–5.1)
Sodium: 138 mmol/L (ref 135–145)
TCO2: 25 mmol/L (ref 22–32)

## 2021-08-31 LAB — DIFFERENTIAL
Abs Immature Granulocytes: 0.11 10*3/uL — ABNORMAL HIGH (ref 0.00–0.07)
Basophils Absolute: 0.1 10*3/uL (ref 0.0–0.1)
Basophils Relative: 0 %
Eosinophils Absolute: 0 10*3/uL (ref 0.0–0.5)
Eosinophils Relative: 0 %
Immature Granulocytes: 1 %
Lymphocytes Relative: 5 %
Lymphs Abs: 0.9 10*3/uL (ref 0.7–4.0)
Monocytes Absolute: 0.8 10*3/uL (ref 0.1–1.0)
Monocytes Relative: 5 %
Neutro Abs: 14.8 10*3/uL — ABNORMAL HIGH (ref 1.7–7.7)
Neutrophils Relative %: 89 %

## 2021-08-31 LAB — RAPID URINE DRUG SCREEN, HOSP PERFORMED
Amphetamines: NOT DETECTED
Barbiturates: NOT DETECTED
Benzodiazepines: NOT DETECTED
Cocaine: NOT DETECTED
Opiates: NOT DETECTED
Tetrahydrocannabinol: NOT DETECTED

## 2021-08-31 LAB — CSF CELL COUNT WITH DIFFERENTIAL
RBC Count, CSF: 20 /mm3 — ABNORMAL HIGH
RBC Count, CSF: 820 /mm3 — ABNORMAL HIGH
Tube #: 1
Tube #: 4
WBC, CSF: 0 /mm3 (ref 0–5)
WBC, CSF: 0 /mm3 (ref 0–5)

## 2021-08-31 LAB — PROTIME-INR
INR: 1.1 (ref 0.8–1.2)
Prothrombin Time: 14.2 seconds (ref 11.4–15.2)

## 2021-08-31 LAB — PROCALCITONIN: Procalcitonin: <0.1 ng/mL

## 2021-08-31 LAB — URINALYSIS, ROUTINE W REFLEX MICROSCOPIC
Bilirubin Urine: NEGATIVE
Glucose, UA: NEGATIVE mg/dL
Hgb urine dipstick: NEGATIVE
Ketones, ur: 80 mg/dL — AB
Leukocytes,Ua: NEGATIVE
Nitrite: NEGATIVE
Protein, ur: NEGATIVE mg/dL
Specific Gravity, Urine: 1.021 (ref 1.005–1.030)
pH: 5 (ref 5.0–8.0)

## 2021-08-31 LAB — MAGNESIUM: Magnesium: 1.7 mg/dL (ref 1.7–2.4)

## 2021-08-31 LAB — CBG MONITORING, ED: Glucose-Capillary: 100 mg/dL — ABNORMAL HIGH (ref 70–99)

## 2021-08-31 LAB — PROTEIN AND GLUCOSE, CSF
Glucose, CSF: 66 mg/dL (ref 40–70)
Total  Protein, CSF: 26 mg/dL (ref 15–45)

## 2021-08-31 LAB — BLOOD GAS, VENOUS
Acid-base deficit: 1.7 mmol/L (ref 0.0–2.0)
Bicarbonate: 22.1 mmol/L (ref 20.0–28.0)
O2 Saturation: 97.5 %
Patient temperature: 37
pCO2, Ven: 34 mmHg — ABNORMAL LOW (ref 44–60)
pH, Ven: 7.42 (ref 7.25–7.43)
pO2, Ven: 118 mmHg — ABNORMAL HIGH (ref 32–45)

## 2021-08-31 LAB — RESP PANEL BY RT-PCR (FLU A&B, COVID) ARPGX2
Influenza A by PCR: NEGATIVE
Influenza B by PCR: NEGATIVE
SARS Coronavirus 2 by RT PCR: NEGATIVE

## 2021-08-31 LAB — APTT: aPTT: 36 seconds (ref 24–36)

## 2021-08-31 LAB — HEPATIC FUNCTION PANEL
ALT: 9 U/L (ref 0–44)
AST: 11 U/L — ABNORMAL LOW (ref 15–41)
Albumin: 3 g/dL — ABNORMAL LOW (ref 3.5–5.0)
Alkaline Phosphatase: 59 U/L (ref 38–126)
Bilirubin, Direct: 0.1 mg/dL (ref 0.0–0.2)
Indirect Bilirubin: 0.6 mg/dL (ref 0.3–0.9)
Total Bilirubin: 0.7 mg/dL (ref 0.3–1.2)
Total Protein: 5.7 g/dL — ABNORMAL LOW (ref 6.5–8.1)

## 2021-08-31 LAB — PROTEIN, CSF: Total  Protein, CSF: 27 mg/dL (ref 15–45)

## 2021-08-31 LAB — PHOSPHORUS: Phosphorus: 4.3 mg/dL (ref 2.5–4.6)

## 2021-08-31 LAB — CK: Total CK: 33 U/L — ABNORMAL LOW (ref 38–234)

## 2021-08-31 LAB — LACTIC ACID, PLASMA
Lactic Acid, Venous: 0.8 mmol/L (ref 0.5–1.9)
Lactic Acid, Venous: 0.7 mmol/L (ref 0.5–1.9)

## 2021-08-31 LAB — AMMONIA: Ammonia: 13 umol/L (ref 9–35)

## 2021-08-31 LAB — TSH: TSH: 0.504 u[IU]/mL (ref 0.350–4.500)

## 2021-08-31 LAB — ETHANOL: Alcohol, Ethyl (B): <10 mg/dL (ref <10)

## 2021-08-31 LAB — GLUCOSE, CSF: Glucose, CSF: 65 mg/dL (ref 40–70)

## 2021-08-31 MED ORDER — METRONIDAZOLE 500 MG/100ML IV SOLN
500.0000 mg | Freq: Once | INTRAVENOUS | Status: AC
  Administered 2021-08-31: 500 mg via INTRAVENOUS
  Filled 2021-08-31: qty 100

## 2021-08-31 MED ORDER — LORAZEPAM 2 MG/ML IJ SOLN
1.0000 mg | Freq: Once | INTRAMUSCULAR | Status: AC | PRN
  Administered 2021-08-31: 1 mg via INTRAVENOUS

## 2021-08-31 MED ORDER — VANCOMYCIN HCL IN DEXTROSE 1-5 GM/200ML-% IV SOLN
1000.0000 mg | Freq: Once | INTRAVENOUS | Status: AC
  Administered 2021-08-31: 1000 mg via INTRAVENOUS
  Filled 2021-08-31: qty 200

## 2021-08-31 MED ORDER — SODIUM CHLORIDE 0.9 % IV SOLN
75.0000 mL/h | INTRAVENOUS | Status: AC
  Administered 2021-08-31: 75 mL/h via INTRAVENOUS

## 2021-08-31 MED ORDER — LIDOCAINE-EPINEPHRINE (PF) 2 %-1:200000 IJ SOLN
20.0000 mL | Freq: Once | INTRAMUSCULAR | Status: AC
  Administered 2021-08-31: 20 mL
  Filled 2021-08-31: qty 20

## 2021-08-31 MED ORDER — IOHEXOL 300 MG/ML  SOLN
100.0000 mL | Freq: Once | INTRAMUSCULAR | Status: AC | PRN
  Administered 2021-08-31: 100 mL via INTRAVENOUS

## 2021-08-31 MED ORDER — DEXTROSE 5 % IV SOLN
10.0000 mg/kg | INTRAVENOUS | Status: AC
  Administered 2021-08-31: 815 mg via INTRAVENOUS
  Filled 2021-08-31: qty 16.3

## 2021-08-31 MED ORDER — ACETAMINOPHEN 325 MG PO TABS
650.0000 mg | ORAL_TABLET | Freq: Once | ORAL | Status: AC
  Administered 2021-08-31: 650 mg via ORAL
  Filled 2021-08-31: qty 2

## 2021-08-31 MED ORDER — DEXTROSE 5 % IV SOLN
10.0000 mg/kg | INTRAVENOUS | Status: DC
  Filled 2021-08-31: qty 16.3

## 2021-08-31 MED ORDER — ACETAMINOPHEN 325 MG PO TABS
650.0000 mg | ORAL_TABLET | Freq: Four times a day (QID) | ORAL | Status: DC | PRN
  Administered 2021-09-01 – 2021-09-05 (×9): 650 mg via ORAL
  Filled 2021-08-31 (×10): qty 2

## 2021-08-31 MED ORDER — ASPIRIN 81 MG PO CHEW
324.0000 mg | CHEWABLE_TABLET | Freq: Once | ORAL | Status: AC
  Administered 2021-08-31: 324 mg via ORAL
  Filled 2021-08-31: qty 4

## 2021-08-31 MED ORDER — VANCOMYCIN HCL 1250 MG/250ML IV SOLN: 1250.0000 mg | INTRAVENOUS | Status: DC

## 2021-08-31 MED ORDER — ACETAMINOPHEN 650 MG RE SUPP: 650.0000 mg | Freq: Four times a day (QID) | RECTAL | Status: DC | PRN

## 2021-08-31 MED ORDER — SODIUM CHLORIDE 0.9 % IV SOLN
2.0000 g | Freq: Once | INTRAVENOUS | Status: AC
  Administered 2021-08-31: 2 g via INTRAVENOUS
  Filled 2021-08-31: qty 2

## 2021-08-31 MED ORDER — SODIUM CHLORIDE 0.9 % IV BOLUS
1000.0000 mL | Freq: Once | INTRAVENOUS | Status: AC
  Administered 2021-08-31: 1000 mL via INTRAVENOUS

## 2021-08-31 MED ORDER — POLYETHYLENE GLYCOL 3350 17 G PO PACK
17.0000 g | PACK | Freq: Every day | ORAL | Status: DC
  Administered 2021-09-02: 17 g via ORAL
  Filled 2021-08-31 (×4): qty 1

## 2021-08-31 MED ORDER — LORAZEPAM 2 MG/ML IJ SOLN
1.0000 mg | INTRAMUSCULAR | Status: DC | PRN
  Filled 2021-08-31 (×2): qty 1

## 2021-08-31 MED ORDER — SODIUM CHLORIDE 0.9 % IV SOLN
2.0000 g | Freq: Two times a day (BID) | INTRAVENOUS | Status: DC
  Administered 2021-09-01: 2 g via INTRAVENOUS
  Filled 2021-08-31 (×2): qty 2

## 2021-08-31 MED ORDER — DEXTROSE 5 % IV SOLN
10.0000 mg/kg | Freq: Three times a day (TID) | INTRAVENOUS | Status: DC
  Administered 2021-09-01: 615 mg via INTRAVENOUS
  Filled 2021-08-31 (×3): qty 12.3

## 2021-08-31 MED ORDER — LACTATED RINGERS IV BOLUS
1000.0000 mL | Freq: Once | INTRAVENOUS | Status: AC
  Administered 2021-08-31: 1000 mL via INTRAVENOUS

## 2021-08-31 MED ORDER — SODIUM CHLORIDE 0.9 % IV SOLN: INTRAVENOUS | Status: DC

## 2021-08-31 NOTE — Assessment & Plan Note (Signed)
restart statin ?

## 2021-08-31 NOTE — Assessment & Plan Note (Signed)
rare gram pos cocci on CSF , clinically menigitis appears less likely  Glucose/protien wnl ?No PMNs ?Continue broad-spectrum antibiotics cefepime and Vanc and add on acyclovir ?CSF culture pending blood cultures pending ?Discussed with ID will see in consult in a.m. ?Droplet precautions ?MRI to evaluate for herpetic encephalitis ? ? ? ?

## 2021-08-31 NOTE — H&P (Signed)
? ? ?[name] [id number] DOB: 08-30-1945 DOA: 08/31/2021 ?  ?  ?PCP: Plotnikov, Evie Lacks, MD   ?Outpatient Specialists:   ? Gi  Dr.  Henrene Pastor ? ?Patient arrived to ER on 08/31/21 at 1701 ?Referred by Attending Sherwood Gambler, MD ? ? ?Patient coming from:   ? home Lives alone,    With family comes to check on her regularly ?  ? ?Chief Complaint:   ?Chief Complaint  ?Patient presents with  ? Altered Mental Status  ? ? ?HPI: ?[name] is a 76 y.o. female with medical history significant of HTN, HLD, CAD, macular degeneration.  ?  ? ?Presented with   confusion ?Patient presents with confusion speech delay and disorientation.  She was acting normal this morning by around 3 PM she noted to be more confused could not remember what happened today and was sent to emergency department ?It is possible that she have had a fall. ?Patient recently was seen by her primary care provider for sinusitis and was treated with cefdinir ?  ?She has severe constipation and sees Dr. Henrene Pastor for it ?Patient is very active at baseline ?Family reports last fall was in January ? ? Initial COVID TEST  ?NEGATIVE  ? ?Lab Results  ?Component Value Date  ? [place] NEGATIVE 08/31/2021  ? ?  ?Regarding pertinent Chronic problems:   ? ? Hyperlipidemia -  on statins mevacor ?Lipid Panel  ?   ?Component Value Date/Time  ? CHOL 141 08/31/2021 1942  ? TRIG 54 08/31/2021 1942  ? HDL 48 08/31/2021 1942  ? CHOLHDL 2.9 08/31/2021 1942  ? VLDL 11 08/31/2021 1942  ? [place] 82 08/31/2021 1942  ? LDLDIRECT 111.0 02/04/2019 1149  ? ? ? HTN on Verapamil ?  obesity-   ?BMI Readings from Last 1 Encounters:  ?08/31/21 34.01 kg/m?  ? ?  ? ?  ?While in ER: ?  ? ?ER did spinal tap ?  ? ?Ordered ? ?CT HEAD   NON acute ? ?CXR -  NON acute ? ?CTabd/pelvis -  non acute, constipation ? ?  ? ?Following Medications were ordered in ER: ?Medications  ?LORazepam (ATIVAN) injection 1 mg (has no administration in time range)  ?metroNIDAZOLE (FLAGYL) IVPB 500 mg (500 mg  Intravenous New Bag/Given 08/31/21 2054)  ?vancomycin (VANCOCIN) IVPB 1000 mg/200 mL premix (1,000 mg Intravenous New Bag/Given 08/31/21 2054)  ?lactated ringers bolus 1,000 mL (has no administration in time range)  ?acyclovir (ZOVIRAX) 815 mg in dextrose 5 % 150 mL IVPB (has no administration in time range)  ?acyclovir (ZOVIRAX) 615 mg in dextrose 5 % 100 mL IVPB (has no administration in time range)  ?aspirin chewable tablet 324 mg (324 mg Oral Given 08/31/21 1846)  ?LORazepam (ATIVAN) injection 1 mg (1 mg Intravenous Given 08/31/21 2027)  ?ceFEPIme (MAXIPIME) 2 g in sodium chloride 0.9 % 100 mL IVPB (0 g Intravenous Stopped 08/31/21 2110)  ?acetaminophen (TYLENOL) tablet 650 mg (650 mg Oral Given 08/31/21 1955)  ?lidocaine-EPINEPHrine (XYLOCAINE W/EPI) 2 %-1:200000 (PF) injection 20 mL (20 mLs Infiltration Given 08/31/21 2040)  ?sodium chloride 0.9 % bolus 1,000 mL (1,000 mLs Intravenous New Bag/Given 08/31/21 2030)  ?iohexol (OMNIPAQUE) 300 MG/ML solution 100 mL (100 mLs Intravenous Contrast Given 08/31/21 2001)  ?  ?_______________________________________________________ ?ER Provider Called:  Neurology   Dr.Xu ?They Recommend admit to medicine   ? SEEN in ER ?  ?ED Triage Vitals  ?Enc Vitals Group  ?   BP 08/31/21 1709 (!) 142/76  ?  Pulse Rate 08/31/21 1709 (!) 112  ?   Resp 08/31/21 1709 13  ?   Temp 08/31/21 1712 99.2 ?F (37.3 ?C)  ?   Temp Source 08/31/21 1712 Oral  ?   SpO2 08/31/21 1709 93 %  ?   Weight 08/31/21 1711 175 lb 14.8 oz (79.8 kg)  ?   Height 08/31/21 1711 5' (1.524 m)  ?   Head Circumference --   ?   Peak Flow --   ?   Pain Score 08/31/21 1712 0  ?   Pain Loc --   ?   Pain Edu? --   ?   Excl. in [place]? --   ?RAQT(62)@    ? _________________________________________ ?Significant initial  Findings: ?Abnormal Labs Reviewed  ?CBC - Abnormal; Notable for the following components:  ?    Result Value  ? WBC 16.6 (*)   ? All other components within normal limits  ?DIFFERENTIAL - Abnormal; Notable for the  following components:  ? Neutro Abs 14.8 (*)   ? Abs Immature Granulocytes 0.11 (*)   ? All other components within normal limits  ?COMPREHENSIVE METABOLIC PANEL - Abnormal; Notable for the following components:  ? Glucose, Bld 109 (*)   ? Calcium 8.5 (*)   ? AST 13 (*)   ? All other components within normal limits  ?URINALYSIS, ROUTINE W REFLEX MICROSCOPIC - Abnormal; Notable for the following components:  ? Ketones, ur 80 (*)   ? All other components within normal limits  ?CBG MONITORING, ED - Abnormal; Notable for the following components:  ? Glucose-Capillary 100 (*)   ? All other components within normal limits  ?I-STAT CHEM 8, ED - Abnormal; Notable for the following components:  ? Glucose, Bld 107 (*)   ? Calcium, Ion 1.14 (*)   ? All other components within normal limits  ? ?  ?ECG: Ordered ?Personally reviewed by me showing: ?HR : 114 ?Rhythm:Sinus tachycardia ?Ventricular trigeminy ?Inferior infarct, old ?PVCs are new, otherwise similar to Feb 2020s ?QTC 423 ? ? ?____________________ ?This patient meets SIRS Criteria and may be septic. ?  ?  ?The recent clinical data is shown below. ?Vitals:  ? 08/31/21 1815 08/31/21 1842 08/31/21 1845 08/31/21 1953  ?BP: (!) 149/74  (!) 146/56   ?Pulse:      ?Resp: 13  13   ?Temp:  (!) 102.8 ?F (39.3 ?C)    ?TempSrc:  Rectal    ?SpO2: 95%  93%   ?Weight:    81.6 kg  ?Height:    '5\' 1"'$  (1.549 m)  ?  ?WBC ? ?   ?Component Value Date/Time  ? WBC 16.6 (H) 08/31/2021 1715  ? LYMPHSABS 0.9 08/31/2021 1715  ? MONOABS 0.8 08/31/2021 1715  ? EOSABS 0.0 08/31/2021 1715  ? BASOSABS 0.1 08/31/2021 1715  ? ?  ? ?Lactic Acid, Venous ?   ?Component Value Date/Time  ? LATICACIDVEN 0.7 08/31/2021 1942  ? ?  ? ? UA   no evidence of UTI    ?  ?Urine analysis: ?   ?Component Value Date/Time  ? COLORURINE YELLOW 08/31/2021 1715  ? APPEARANCEUR CLEAR 08/31/2021 1715  ? LABSPEC 1.021 08/31/2021 1715  ? PHURINE 5.0 08/31/2021 1715  ? GLUCOSEU NEGATIVE 08/31/2021 1715  ? [place] NEGATIVE  05/24/2020 0929  ? HGBUR NEGATIVE 08/31/2021 1715  ? [place] NEGATIVE 08/31/2021 1715  ? BILIRUBINUR neg 01/26/2017 1031  ? KETONESUR 80 (A) 08/31/2021 1715  ? PROTEINUR NEGATIVE 08/31/2021 1715  ? UROBILINOGEN  1.0 05/24/2020 0929  ? NITRITE NEGATIVE 08/31/2021 1715  ? LEUKOCYTESUR NEGATIVE 08/31/2021 1715  ? ? ?Results for orders placed or performed during the hospital encounter of 08/31/21  ?Resp Panel by RT-PCR (Flu A&B, Covid) Nasopharyngeal Swab     Status: None  ? Collection Time: 08/31/21  5:15 PM  ? Specimen: Nasopharyngeal Swab; Nasopharyngeal(NP) swabs in vial transport medium  ?Result Value Ref Range Status  ? SARS Coronavirus 2 by RT PCR NEGATIVE NEGATIVE Final  ?      ? Influenza A by PCR NEGATIVE NEGATIVE Final  ? Influenza B by PCR NEGATIVE NEGATIVE Final  ?      ?  ?_______________________________________________ ?Hospitalist was called for admission for sepsis ? ?The following Work up has been ordered so far: ? ?Orders Placed This Encounter  ?Procedures  ? Lumbar Puncture  ? Resp Panel by RT-PCR (Flu A&B, Covid) Nasopharyngeal Swab  ? Urine Culture  ? Culture, blood (routine x 2)  ? CSF culture  ? Gram stain  ? CT HEAD CODE STROKE WO CONTRAST  ? DG Chest Portable 1 View  ? MR Brain W and Wo Contrast  ? CT ABDOMEN PELVIS W CONTRAST  ? Ethanol  ? Protime-INR  ? APTT  ? CBC  ? Differential  ? Comprehensive metabolic panel  ? Urine rapid drug screen (hosp performed)  ? Urinalysis, Routine w reflex microscopic  ? Lactic acid, plasma  ? Ammonia  ? Lipid panel  ? Hemoglobin A1c  ? CSF cell count with differential collection tube #: 1  ? CSF cell count with differential collection tube #: 4  ? Glucose, CSF  ? Protein, CSF  ? HSV 1/2 PCR, CSF Cerebrospinal Fluid  ? Protein and glucose, CSF  ? Diet NPO time specified  ? Vital signs  ? Cardiac Monitoring  ? Modified Stroke Scale (mNIHSS) Document mNIHSS assessment every 2 hours for a total of 12 hours  ? Stroke swallow screen  ? Activate teleneurology  ?  Initiate Carrier Fluid Protocol  ? If O2 sat If O2 Sat < 94%, administer O2 at 2 liters/minute via nasal cannula.  ? In and Out Cath  ? Check Rectal Temperature  ? Informed Consent Details: Physician/Practitioner Attest

## 2021-08-31 NOTE — ED Provider Notes (Signed)
?[facility] DEPT ?Provider Note ? ? ?CSN: [id number] ?Arrival date & time: 08/31/21  1701 ? ?  ? ?History ? ?Chief Complaint  ?Patient presents with  ? Altered Mental Status  ? ? ?[name] is a 76 y.o. female. ? ?The history is provided by the patient, a relative and medical records. No language interpreter was used.  ?Altered Mental Status ? ?76 year old female significant history of obesity, anemia, insomnia, anxiety and depression, hypertension, brought in accompanied by daughter for concerns for potential stroke.  History obtained through patient and through daughter who is at bedside.  Around 3 hours ago, family member noticed that patient was having trouble forming her words.  She was unable to answer simple question and seems to having a difficult time with not forming her sentence and slurring her words.  This is unusual for her as she is normally very "sharp" according to daughter.  Her symptom is slightly improving.  She does not complain of any headache, worsening depression, denies any focal numbness or focal weakness but does endorse confusion.  Patient did have a fall and struck her head about a month ago.  She also lost her husband last year.  No prior history of prior stroke.  She is not on any anticoagulant.  No chest pain trouble breathing abdominal pain or urinary discomfort. ? ?Home Medications ?Prior to Admission medications   ?Medication Sig Start Date End Date Taking? Authorizing Provider  ?ALPRAZolam (XANAX) 0.5 MG tablet Take 1 tablet (0.5 mg total) by mouth 2 (two) times daily as needed. 08/04/21   Plotnikov, Evie Lacks, MD  ?aspirin 81 MG tablet Take 81 mg by mouth daily.    [provider]  ?benzonatate (TESSALON) 200 MG capsule Take 1 capsule (200 mg total) by mouth 3 (three) times daily as needed for cough. 03/14/21   Plotnikov, Evie Lacks, MD  ?buPROPion (WELLBUTRIN XL) 150 MG 24 hr tablet Take 1 tablet (150 mg total) by mouth daily. 01/18/21    Plotnikov, Evie Lacks, MD  ?cefdinir (OMNICEF) 300 MG capsule Take 1 capsule (300 mg total) by mouth 2 (two) times daily. 08/04/21   Plotnikov, Evie Lacks, MD  ?Cholecalciferol (VITAMIN D) 2000 UNITS CAPS Take 1 capsule by mouth daily.    [provider]  ?fish oil-omega-3 fatty acids 1000 MG capsule Take by mouth daily. Take  600 mg daily    [provider]  ?flurbiprofen (ANSAID) 100 MG tablet Take 1 tablet earliest onset of migraine. May take every 8 hours as needed, maximum '300mg'$  in 24h 05/20/18   Tomi Likens, Adam R, DO  ?fluticasone (FLONASE) 50 MCG/ACT nasal spray Place 2 sprays into both nostrils daily. 10/07/19   Plotnikov, Evie Lacks, MD  ?gabapentin (NEURONTIN) 100 MG capsule Take 1 capsule (100 mg total) by mouth at bedtime. 08/04/21   Plotnikov, Evie Lacks, MD  ?HYDROcodone bit-homatropine (HYCODAN) 5-1.5 MG/5ML syrup Take 5 mLs by mouth every 6 (six) hours as needed for cough. 08/04/21   Plotnikov, Evie Lacks, MD  ?ibuprofen (ADVIL) 600 MG tablet TAKE 1 TAB BY MOUTH EVERY 8 HRS FOR HEADACHE OR MODERATE PAIN. TAKE 2X DAILY X 2WKS THEN AS NEEDED 10/15/19   Plotnikov, Evie Lacks, MD  ?linaclotide (LINZESS) 290 MCG CAPS capsule TAKE 1 CAPSULE BY MOUTH  DAILY BEFORE BREAKFAST 10/07/19   Plotnikov, Evie Lacks, MD  ?lovastatin (MEVACOR) 40 MG tablet Take 1 tablet (40 mg total) by mouth at bedtime. Annual appt due W/LABS must see provider for future  refills 08/04/21   Plotnikov, Evie Lacks, MD  ?Multiple Vitamins-Minerals (OCUVITE PO) Take 2 tablets by mouth daily.    [provider]  ?omeprazole (PRILOSEC) 40 MG capsule Take 1 capsule (40 mg total) by mouth in the morning and at bedtime. 08/04/21   Plotnikov, Evie Lacks, MD  ?ondansetron (ZOFRAN) 4 MG tablet Take 1 tablet (4 mg total) by mouth every 8 (eight) hours as needed for nausea or vomiting. 11/08/20   Plotnikov, Evie Lacks, MD  ?oxybutynin (DITROPAN-XL) 5 MG 24 hr tablet Take 1 tablet by mouth daily. 01/24/19   [provider]  ?Probiotic  Product (ALIGN) 4 MG CAPS Take 1 capsule (4 mg total) by mouth daily. 06/21/13   Plotnikov, Evie Lacks, MD  ?solifenacin (VESICARE) 10 MG tablet TAKE 1 TABLET (10 MG TOTAL) BY MOUTH DAILY. ANNUAL APPT IS DUE MUST SEE PROVIDER FOR FUTURE REFILLS 06/17/21   Plotnikov, Evie Lacks, MD  ?traZODone (DESYREL) 50 MG tablet TAKE 1 TABLET BY MOUTH AT  BEDTIME 12/24/19   Plotnikov, Evie Lacks, MD  ?verapamil (CALAN-SR) 240 MG CR tablet Take 1 tablet (240 mg total) by mouth at bedtime. 08/04/21   Plotnikov, Evie Lacks, MD  ?vitamin B-12 (CYANOCOBALAMIN) 1000 MCG tablet Take 500 mcg by mouth daily.    [provider]  ?   ? ?Allergies    ?Levaquin [levofloxacin]   ? ?Review of Systems   ?Review of Systems  ?All other systems reviewed and are negative. ? ?Physical Exam ?Updated Vital Signs ?BP (!) 142/76 (BP Location: Right Arm)   Pulse (!) 112   Temp 99.2 ?F (37.3 ?C) (Oral)   Resp 13   Ht 5' (1.524 m)   Wt 79.8 kg   SpO2 93%   BMI 34.36 kg/m?  ?Physical Exam ?Vitals and nursing note reviewed.  ?Constitutional:   ?   General: She is not in acute distress. ?   Appearance: She is well-developed. She is obese.  ?HENT:  ?   Head: Atraumatic.  ?Eyes:  ?   Extraocular Movements: Extraocular movements intact.  ?   Conjunctiva/sclera: Conjunctivae normal.  ?   Pupils: Pupils are equal, round, and reactive to light.  ?Cardiovascular:  ?   Rate and Rhythm: Tachycardia present. Rhythm irregular.  ?   Pulses: Normal pulses.  ?   Heart sounds: Normal heart sounds.  ?Pulmonary:  ?   Effort: Pulmonary effort is normal.  ?Abdominal:  ?   Palpations: Abdomen is soft.  ?   Tenderness: There is no abdominal tenderness (mild LLQ tenderness).  ?Musculoskeletal:  ?   Cervical back: Normal range of motion and neck supple. No rigidity.  ?Skin: ?   Findings: No rash.  ?Neurological:  ?   Mental Status: She is alert.  ?   Comments: Neurologic exam: ? ?Speech is slow, pupils equal round reactive to light, extraocular movements intact  ?Normal  peripheral visual fields ?Cranial nerves III through XII normal including no facial droop ?Follows commands, moves all extremities x4, normal strength to bilateral upper and lower extremities at all major muscle groups including grip ?Sensation normal to light touch ?Coordination intact, no limb ataxia, finger-nose-finger normal ?Rapid alternating movements normal ?No pronator drift ?Gait not tested ?  ?Psychiatric:     ?   Mood and Affect: Mood normal.  ? ? ?ED Results / Procedures / Treatments   ?Labs ?(all labs ordered are listed, but only abnormal results are displayed) ?Labs Reviewed  ?CBC - Abnormal; Notable for the following  components:  ?    Result Value  ? WBC 16.6 (*)   ? All other components within normal limits  ?DIFFERENTIAL - Abnormal; Notable for the following components:  ? Neutro Abs 14.8 (*)   ? Abs Immature Granulocytes 0.11 (*)   ? All other components within normal limits  ?COMPREHENSIVE METABOLIC PANEL - Abnormal; Notable for the following components:  ? Glucose, Bld 109 (*)   ? Calcium 8.5 (*)   ? AST 13 (*)   ? All other components within normal limits  ?URINALYSIS, ROUTINE W REFLEX MICROSCOPIC - Abnormal; Notable for the following components:  ? Ketones, ur 80 (*)   ? All other components within normal limits  ?CBG MONITORING, ED - Abnormal; Notable for the following components:  ? Glucose-Capillary 100 (*)   ? All other components within normal limits  ?I-STAT CHEM 8, ED - Abnormal; Notable for the following components:  ? Glucose, Bld 107 (*)   ? Calcium, Ion 1.14 (*)   ? All other components within normal limits  ?RESP PANEL BY RT-PCR (FLU A&B, COVID) [id number]  ?URINE CULTURE  ?CULTURE, BLOOD (ROUTINE X 2)  ?CULTURE, BLOOD (ROUTINE X 2)  ?CSF CULTURE W GRAM STAIN  ?GRAM STAIN  ?ETHANOL  ?PROTIME-INR  ?APTT  ?RAPID URINE DRUG SCREEN, HOSP PERFORMED  ?LACTIC ACID, PLASMA  ?LACTIC ACID, PLASMA  ?AMMONIA  ?LIPID PANEL  ?HEMOGLOBIN A1C  ?CSF CELL COUNT WITH DIFFERENTIAL  ?CSF CELL COUNT WITH  DIFFERENTIAL  ?GLUCOSE, CSF  ?PROTEIN, CSF  ?HSV 1/2 PCR, CSF  ?PROTEIN AND GLUCOSE, CSF  ?PROCALCITONIN  ?BASIC METABOLIC PANEL  ? ? ?EKG ?EKG Interpretation ? ?Date/Time:  Thursday August 31 2021 17:14:54 EDT ?Ventricular

## 2021-08-31 NOTE — Assessment & Plan Note (Signed)
Allow permissive HTN 

## 2021-08-31 NOTE — Consult Note (Signed)
1734-Code stroke called ?1741-Dr Xu to camera ?

## 2021-08-31 NOTE — Progress Notes (Signed)
Pharmacy Antibiotic Note ? ?[name] is a 76 y.o. female admitted on 08/31/2021 with sepsis.  Pharmacy has been consulted for cefepime and vancomycin dosing. ? ?In ED, patient received ?3/16 19:58 cefepime 2 g ?3/16 20:54 vancomycin 1 g ? ?Plan: ?Cefepime 2 g IV every 12 hours ?Vancomycin 1250 mg IV every 36 hours ?Monitor clinical progress, renal function, vancomycin levels if indicated ?F/U C&S, abx deescalation / LOT ? ? ?Height: '5\' 1"'$  (154.9 cm) ?Weight: 81.6 kg (180 lb) ?IBW/kg (Calculated) : 47.8 ? ?Temp (24hrs), Avg:99.9 ?F (37.7 ?C), Min:98.7 ?F (37.1 ?C), Max:102.8 ?F (39.3 ?C) ? ?Recent Labs  ?Lab 08/31/21 ?1715 08/31/21 ?1721 08/31/21 ?1812 08/31/21 ?1942  ?WBC 16.6*  --   --   --   ?CREATININE 0.81 0.70  --   --   ?LATICACIDVEN  --   --  0.8 0.7  ?  ?Estimated Creatinine Clearance: 58.8 mL/min (by C-G formula based on SCr of 0.7 mg/dL).   ? ?Allergies  ?Allergen Reactions  ? Levaquin [Levofloxacin]   ?  nausea  ? ? ?Antimicrobials this admission: ?3/16 Flagyl x 1 ?3/16 Cefepime >>  ?3/16 vancomycin >>  ?3/16 acyclovir >> ? ?Microbiology results: ?3/16 BCx: sent ?3/16 UCx: sent  ?3/16 CSF: pending ? ?Thank you for allowing pharmacy to be a part of this patient?s care. ? ?Royetta Asal, PharmD, BCPS ?Clinical Pharmacist ?[facility] ?Please utilize Amion for appropriate phone number to reach the unit pharmacist ([facility]) ?08/31/2021 10:10 PM ? ? ?

## 2021-08-31 NOTE — Assessment & Plan Note (Signed)
-   most likely multifactorial secondary to combination of infection mild dehydration   ? - Will rehydrate  ? - treat underlining infection  ? - Hold contributing medications  ?Obtain MRI to further eval for possible herpetic encephalitis  ? - neurological exam appears to be nonfoca  ? - VBG ordered ?  - no history of liver disease ammonia unremarkable ? ?

## 2021-08-31 NOTE — Subjective & Objective (Addendum)
Patient presents with confusion speech delay and disorientation.  She was acting normal this morning by around 3 PM she noted to be more confused could not remember what happened today and was sent to emergency department ?  ?Patient recently was seen by her primary care provider for sinusitis and was treated with cefdinir ?

## 2021-08-31 NOTE — Assessment & Plan Note (Signed)
Unclear if patient has history of this in the past. ?Check electrolytes ?Check echogram ?

## 2021-08-31 NOTE — Consult Note (Signed)
?[facility] Telemedicine Consult ? ? ?Requesting Provider: Dr. Regenia Skeeter ? ?Chief Complaint: confusion ? ?HPI:  ?76 year old female with history of hypertension, hyperlipidemia, CAD, fibromyalgia, B12 deficiency, depression presented to ED for confusion, speech delay, disorientation.  Per daughter, patient acting normal this morning.  Last seen well 1:30 PM when she was able to talk and no confusion.  Around 2 to 3 PM 1 family checking on her again found she was confused, not able to remember what happened today, not able to give a coherent story.  She was sent to ER for evaluation.  The ED BP 142/76, glucose 100, CT head no acute abnormality.  Creatinine 0.70, WBC 16.6.  UA pending.  Patient cannot remember what medication she takes but denies taking extra medications.  She takes aspirin 80 1 at night. ? ?In 06/2021 patient had a fall, per daughter, more like mechanical fall with both hand occupied while walking upstairs.  She came to [facility] ED and CT showed no acute intracranial abnormality but left frontal scalp hematoma, and a partial left mastoid effusion.  In 07/2021 patient had a PCP visit for headache and cough with congestion, concerning for sinusitis, put on Cefdinir.  But patient stopped the medication by herself.  Currently no fever. ? ?LKW: 1:30 PM ?tpa given?: No, no focal deficit, likely not stroke, barely OSW ?IR Thrombectomy? No, no sign of LVO and low NIHSS ?Modified Rankin Scale: 0-Completely asymptomatic and back to baseline post- stroke ? ? ?Exam: ?Vitals:  ? 08/31/21 1709 08/31/21 1712  ?BP: (!) 142/76   ?Pulse: (!) 112   ?Resp: 13   ?Temp:  99.2 ?F (37.3 ?C)  ?SpO2: 93%   ? ? ? ?Temp:  [99.2 ?F (37.3 ?C)] 99.2 ?F (37.3 ?C) (03/16 1712) ?Pulse Rate:  [112] 112 (03/16 1709) ?Resp:  [13] 13 (03/16 1709) ?BP: (142)/(76) 142/76 (03/16 1709) ?SpO2:  [93 %] 93 % (03/16 1709) ?Weight:  [79.8 kg] 79.8 kg (03/16 1711) ? ?General - Well nourished, well developed, in no apparent  distress. ? ?Ophthalmologic - fundi not visualized due to noncooperation. ? ?Cardiovascular - Regular rhythm and rate. ? ?Neuro - awake, alert, eyes open, orientated to self, place, DOB and people, however not orientated to time or age or situation. Amnesic. No aphasia, fluent with short spontaneous sentences, however, hesitant with nonfluent language when describing pictures, following all simple commands. Able to name and repeat and read. No gaze palsy, tracking bilaterally, visual field full. No facial droop. Tongue midline. Bilateral UEs 5/5, no drift. Bilaterally LEs 5/5, no drift. Sensation symmetrical bilaterally, b/l FTN intact, gait not tested.  ? ? ?NIH Stroke Scale ? ?Level Of Consciousness 0=Alert; keenly responsive ?1=Arouse to minor stimulation ?2=Requires repeated stimulation to arouse or movements to pain ?3=postures or unresponsive 0  ?LOC Questions to Month and Age 51=Answers both questions correctly ?1=Answers one question correctly or dysarthria/intubated/trauma/language barrier ?2=Answers neither question correctly or aphasia 2  ?LOC Commands  ?    -Open/Close eyes ?    -Open/close grip ?    -Pantomime commands if communication barrier 0=Performs both tasks correctly ?1=Performs one task correctly ?2=Performs neighter task correctly 0  ?Best Gaze ?    -Only assess horizontal gaze 0=Normal ?1=Partial gaze palsy ?2=Forced deviation, or total gaze paresis 0  ?Visual 0=No visual loss ?1=Partial hemianopia ?2=Complete hemianopia ?3=Bilateral hemianopia (blind including cortical blindness) 0  ?Facial Palsy ?    -Use grimace if obtunded 0=Normal symmetrical movement ?1=Minor paralysis (asymmetry) ?2=Partial paralysis (lower face) ?3=Complete paralysis (  upper and lower face) 0  ?Motor ? 0=No drift for 10/5 seconds ?1=Drift, but does not hit bed ?2=Some antigravity effort, hits  bed ?3=No effort against gravity, limb falls ?4=No movement ?0=Amputation/joint fusion Right Arm 0  ?   Leg 0  ?  Left Arm 0  ?    Leg 0  ?Limb Ataxia ?    - FNT/HTS 0=Absent or does not understand or paralyzed or amputation/joint fusion ?1=Present in one limb ?2=Present in two limbs 0  ?Sensory 0=Normal ?1=Mild to moderate sensory loss ?2=Severe to total sensory loss or coma/unresponsive 0  ?Best Language 0=No aphasia, normal ?1=Mild to moderate aphasia ?2=Severe aphasia ?3=Mute, global aphasia, or coma/unresponsive 1  ?Dysarthria 0=Normal ?1=Mild to moderate ?2=Severe, unintelligible or mute/anarthric ?0=intubated/unable to test 0  ?Extinction/Neglect 0=No abnormality ?1=visual/tactile/auditory/spatia/personal inattention/Extinction to bilateral simultaneous stimulation ?2=Profound neglect/extinction more than 1 modality  0  ?Total ?  3  ? ? ? ? ?Imaging Reviewed:  ?CT HEAD CODE STROKE WO CONTRAST ? ?Result Date: 08/31/2021 ?CLINICAL DATA:  Code stroke. Neuro deficit, acute, stroke suspected. Speech disturbance. EXAM: CT HEAD WITHOUT CONTRAST TECHNIQUE: Contiguous axial images were obtained from the base of the skull through the vertex without intravenous contrast. RADIATION DOSE REDUCTION: This exam was performed according to the departmental dose-optimization program which includes automated exposure control, adjustment of the mA and/or kV according to patient size and/or use of iterative reconstruction technique. COMPARISON:  07/15/2021 FINDINGS: Brain: No sign of acute infarction. No focal abnormality seen affecting the brainstem or cerebellum. 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 No cortical or large vessel territory insult. No mass lesion, hemorrhage, hydrocephalus or extra-axial collection. Vascular: There is atherosclerotic calcification of the major vessels at the base of the brain. Skull: Negative Sinuses/Orbits: Clear/normal Other: Resolution of the previously seen left frontal soft tissue hematoma.  ASPECTS [facility] Stroke Program Early CT Score) - Ganglionic level infarction (caudate, lentiform nuclei, internal capsule, insula, M1-M3 cortex): 7 - Supraganglionic infarction (M4-M6 cortex): 3 Total score (0-10 with 10 being normal): 10 IMPRESSION: 1. No acute CT finding. Mild chronic small-vessel ischemic changes as above. 2. Resolution of the previously seen left frontal soft tissue hematoma. 3. ASPECTS is 10 4. These results were called by telephone at the time of interpretation on 08/31/2021 at 5:32 pm to provider Dr. Verta Ellen, who verbally acknowledged these results. Electronically Signed   By: Nelson Chimes [name]   On: 08/31/2021 17:32   ? ? ?Labs reviewed in epic and pertinent values follow: ?Creatinine 0.70, WBC 16.6.  UA pending ? ?Assessment:  ?76 year old female with history of hypertension, hyperlipidemia, CAD on ASA 81, fibromyalgia, B12 deficiency, depression presented to ED for confusion, amnesia, disorientation.  Last seen well 1:30 PM and NIHSS = 3 but no focal deficit but more confusion. CT head no acute abnormality.  WBC 16.6, afebrile.  She takes aspirin 80 1 at night. One month ago patient had a PCP visit for headache and cough with congestion, concerning for sinusitis, put on Cefdinir.  But patient stopped the medication by herself.  ? ?Patient not a tPA candidate given no focal deficit, examination more consistent with encephalopathy and barely OSW.  Not IR candidate given no LVO sign and low NIH score.  Patient symptoms concerning for encephalopathy especially given recent suspect infection and WBC 16.6.  Recommend further in encephalopathy work-up including UA, CXR, ammonia level, lactic acid.  Recommend MRI with and without contrast, EEG for further evaluation.  ? ?Recommendations:  ?further in encephalopathy work-up including UA, CXR, ammonia level, lactic acid.   ?MRI with and without contrast ?EEG ?Lipid panel, hemoglobin A1c ?Home aspirin 81 tonight ?We will follow ? ? ?Consult  Participants: Daughter, patient, RN and stroke coordinator ?Location of the provider: [facility] ?Location of the patient: [facility] ? ?Time Code Stroke Page received:  N/A ?Time neurologist arrived:  5:45 ?Time NIHSS completed: 5:56   ? ?Terie Purser

## 2021-08-31 NOTE — Assessment & Plan Note (Signed)
Will be covered with abx ? ?

## 2021-08-31 NOTE — Assessment & Plan Note (Addendum)
-  SIRS criteria met with elevated white blood cell count,    ?   ?Component Value Date/Time  ? WBC 16.6 (H) 08/31/2021 1715  ? LYMPHSABS 0.9 08/31/2021 1715  ? ? ? fever  RR >20 ?Today's Vitals  ? 08/31/21 1900 08/31/21 1915 08/31/21 1953 08/31/21 2100  ?BP: (!) 146/61 140/71  (!) 124/112  ?Pulse: 72 88  71  ?Resp: (!) 26 (!) 22  (!) 23  ?Temp:    98.9 ?F (37.2 ?C)  ?TempSrc:    Oral  ?SpO2: 93% 93%  93%  ?Weight:   81.6 kg   ?Height:   _0  (1.549 m)   ?PainSc:      ? ?  ? ? ?The recent clinical data is shown below. ?Vitals:  ? 08/31/21 1900 08/31/21 1915 08/31/21 1953 08/31/21 2100  ?BP: (!) 146/61 140/71  (!) 124/112  ?Pulse: 72 88  71  ?Resp: (!) 26 (!) 22  (!) 23  ?Temp:    98.9 ?F (37.2 ?C)  ?TempSrc:    Oral  ?SpO2: 93% 93%  93%  ?Weight:   81.6 kg   ?Height:   _1  (1.549 m)   ?  ?  Source of sepsis is unknown but given clinical picture will continue to treat ? ? Patient meeting criteria for Severe sepsis with  ?  evidence of end organ damage/organ dysfunction such as  ?   acute metabolic encephalopathy  ?   ?Component Value Date/Time  ? PLT 215 08/31/2021 1715  ? ?   ? - Obtain serial lactic acid and procalcitonin level. ? - Initiated IV antibiotics in ER: ?Antibiotics Given (last 72 hours)   ? Date/Time Action Medication Dose Rate  ? 08/31/21 1958 New Bag/Given  ? ceFEPIme (MAXIPIME) 2 g in sodium chloride 0.9 % 100 mL IVPB 2 g 200 mL/hr  ? 08/31/21 2054 New Bag/Given  ? metroNIDAZOLE (FLAGYL) IVPB 500 mg 500 mg 100 mL/hr  ? 08/31/21 2054 New Bag/Given  ? vancomycin (VANCOCIN) IVPB 1000 mg/200 mL premix 1,000 mg 200 mL/hr  ?  ? ? ?Will continue   Cefepime, vanc and acyclovir ? ? - await results of blood and urine culture ? - Rehydrate aggressively  ?Intravenous fluids were administered,  ? ?  ?9:17 PM ? ?

## 2021-08-31 NOTE — Progress Notes (Signed)
Pharmacy Antibiotic Note ? ?[name] is a 76 y.o. female admitted on 08/31/2021 with  r/o herpes encephalitis .  Pharmacy has been consulted for Acyclovir dosing. ? ?CC/HPI: Confusion, speech delay, disorientation. ?Code Stroke, CT negative, no TNK ? ?PMH: hypertension, hyperlipidemia, CAD, fibromyalgia, B12 deficiency, depression ? ?ID: r/o herpes encephalitis ?- Tmax 102.8. WBC 16.6. Scr 0.7 ?IBW 47.8 kg ?TBW 81.6kg ?Adj BW: 58 kg ? ?Acyclovir 3/16>> ?Flagyl 3/16>> ?Vanco 3/16>> ? ?3/16: CSF>> ?3/16 Flu/COVID>negative ? ?Plan: ?Acyclovir '10mg'$ /kg every 8 hrs. ?NS at 152m/hr to prevent renal toxicity while taking acyclovir per protocol ? ? ? ? ?Height: '5\' 1"'$  (154.9 cm) ?Weight: 81.6 kg (180 lb) ?IBW/kg (Calculated) : 47.8 ? ?Temp (24hrs), Avg:101 ?F (38.3 ?C), Min:99.2 ?F (37.3 ?C), Max:102.8 ?F (39.3 ?C) ? ?Recent Labs  ?Lab 08/31/21 ?1715 08/31/21 ?1721 08/31/21 ?1812 08/31/21 ?1942  ?WBC 16.6*  --   --   --   ?CREATININE 0.81 0.70  --   --   ?LATICACIDVEN  --   --  0.8 0.7  ?  ?Estimated Creatinine Clearance: 58.8 mL/min (by C-G formula based on SCr of 0.7 mg/dL).   ? ?Allergies  ?Allergen Reactions  ? Levaquin [Levofloxacin]   ?  nausea  ? ? ?[name] S. RAlford Highland PharmD, BCPS ?Clinical Staff Pharmacist ?[facility] ? ?RAlford Highland [employer]?08/31/2021 9:04 PM ? ?

## 2021-08-31 NOTE — ED Triage Notes (Signed)
Pt arrived for possible stroke. Pt unexpectedly started to talk abnormally and unable to answer questions per daughter. It's gradually gotten worse over the last couple hours. ?

## 2021-08-31 NOTE — Assessment & Plan Note (Signed)
Follow up as an outpt ?

## 2021-08-31 NOTE — Assessment & Plan Note (Signed)
Possibly verapamil induced, bowel regimen ?Would hold verapamil unless is used for underlying dysrhythmia ? ?

## 2021-09-01 ENCOUNTER — Inpatient Hospital Stay (HOSPITAL_COMMUNITY)
Admit: 2021-09-01 | Discharge: 2021-09-01 | Disposition: A | Discharge status: Home / Self Care | Payer: Medicare PPO | Attending: Emergency Medicine | Admitting: Emergency Medicine

## 2021-09-01 ENCOUNTER — Observation Stay (HOSPITAL_COMMUNITY): Payer: Medicare PPO

## 2021-09-01 DIAGNOSIS — E538 Deficiency of other specified B group vitamins: Secondary | ICD-10-CM | POA: Diagnosis present

## 2021-09-01 DIAGNOSIS — F419 Anxiety disorder, unspecified: Secondary | ICD-10-CM | POA: Diagnosis present

## 2021-09-01 DIAGNOSIS — E785 Hyperlipidemia, unspecified: Secondary | ICD-10-CM | POA: Diagnosis present

## 2021-09-01 DIAGNOSIS — G9340 Encephalopathy, unspecified: Secondary | ICD-10-CM | POA: Diagnosis not present

## 2021-09-01 DIAGNOSIS — E860 Dehydration: Secondary | ICD-10-CM | POA: Diagnosis present

## 2021-09-01 DIAGNOSIS — J029 Acute pharyngitis, unspecified: Secondary | ICD-10-CM | POA: Diagnosis not present

## 2021-09-01 DIAGNOSIS — R6510 Systemic inflammatory response syndrome (SIRS) of non-infectious origin without acute organ dysfunction: Secondary | ICD-10-CM | POA: Diagnosis not present

## 2021-09-01 DIAGNOSIS — K5790 Diverticulosis of intestine, part unspecified, without perforation or abscess without bleeding: Secondary | ICD-10-CM | POA: Diagnosis present

## 2021-09-01 DIAGNOSIS — R030 Elevated blood-pressure reading, without diagnosis of hypertension: Secondary | ICD-10-CM

## 2021-09-01 DIAGNOSIS — K2270 Barrett's esophagus without dysplasia: Secondary | ICD-10-CM | POA: Diagnosis present

## 2021-09-01 DIAGNOSIS — K5909 Other constipation: Secondary | ICD-10-CM

## 2021-09-01 DIAGNOSIS — G928 Other toxic encephalopathy: Secondary | ICD-10-CM

## 2021-09-01 DIAGNOSIS — G9341 Metabolic encephalopathy: Secondary | ICD-10-CM | POA: Diagnosis not present

## 2021-09-01 DIAGNOSIS — K219 Gastro-esophageal reflux disease without esophagitis: Secondary | ICD-10-CM | POA: Diagnosis present

## 2021-09-01 DIAGNOSIS — G039 Meningitis, unspecified: Secondary | ICD-10-CM | POA: Diagnosis not present

## 2021-09-01 DIAGNOSIS — M797 Fibromyalgia: Secondary | ICD-10-CM | POA: Diagnosis present

## 2021-09-01 DIAGNOSIS — R509 Fever, unspecified: Secondary | ICD-10-CM

## 2021-09-01 DIAGNOSIS — Z980 Intestinal bypass and anastomosis status: Secondary | ICD-10-CM

## 2021-09-01 DIAGNOSIS — Z8719 Personal history of other diseases of the digestive system: Secondary | ICD-10-CM

## 2021-09-01 DIAGNOSIS — R008 Other abnormalities of heart beat: Secondary | ICD-10-CM | POA: Diagnosis not present

## 2021-09-01 DIAGNOSIS — I10 Essential (primary) hypertension: Secondary | ICD-10-CM | POA: Diagnosis present

## 2021-09-01 DIAGNOSIS — E876 Hypokalemia: Secondary | ICD-10-CM

## 2021-09-01 DIAGNOSIS — K5900 Constipation, unspecified: Secondary | ICD-10-CM | POA: Diagnosis present

## 2021-09-01 DIAGNOSIS — G43909 Migraine, unspecified, not intractable, without status migrainosus: Secondary | ICD-10-CM | POA: Diagnosis present

## 2021-09-01 DIAGNOSIS — I493 Ventricular premature depolarization: Secondary | ICD-10-CM | POA: Diagnosis present

## 2021-09-01 DIAGNOSIS — J329 Chronic sinusitis, unspecified: Secondary | ICD-10-CM | POA: Diagnosis present

## 2021-09-01 DIAGNOSIS — G030 Nonpyogenic meningitis: Secondary | ICD-10-CM | POA: Diagnosis present

## 2021-09-01 DIAGNOSIS — R000 Tachycardia, unspecified: Secondary | ICD-10-CM | POA: Diagnosis not present

## 2021-09-01 DIAGNOSIS — I2510 Atherosclerotic heart disease of native coronary artery without angina pectoris: Secondary | ICD-10-CM | POA: Diagnosis present

## 2021-09-01 DIAGNOSIS — R296 Repeated falls: Secondary | ICD-10-CM

## 2021-09-01 DIAGNOSIS — I639 Cerebral infarction, unspecified: Secondary | ICD-10-CM | POA: Diagnosis not present

## 2021-09-01 DIAGNOSIS — B349 Viral infection, unspecified: Secondary | ICD-10-CM | POA: Diagnosis not present

## 2021-09-01 DIAGNOSIS — M1990 Unspecified osteoarthritis, unspecified site: Secondary | ICD-10-CM | POA: Diagnosis present

## 2021-09-01 DIAGNOSIS — K5730 Diverticulosis of large intestine without perforation or abscess without bleeding: Secondary | ICD-10-CM | POA: Diagnosis present

## 2021-09-01 DIAGNOSIS — M810 Age-related osteoporosis without current pathological fracture: Secondary | ICD-10-CM | POA: Diagnosis present

## 2021-09-01 DIAGNOSIS — R410 Disorientation, unspecified: Secondary | ICD-10-CM | POA: Diagnosis present

## 2021-09-01 DIAGNOSIS — F418 Other specified anxiety disorders: Secondary | ICD-10-CM | POA: Diagnosis not present

## 2021-09-01 DIAGNOSIS — F32A Depression, unspecified: Secondary | ICD-10-CM | POA: Diagnosis present

## 2021-09-01 DIAGNOSIS — Z20822 Contact with and (suspected) exposure to covid-19: Secondary | ICD-10-CM | POA: Diagnosis present

## 2021-09-01 LAB — CBC WITH DIFFERENTIAL/PLATELET
Abs Immature Granulocytes: 0.13 10*3/uL — ABNORMAL HIGH (ref 0.00–0.07)
Basophils Absolute: 0.1 10*3/uL (ref 0.0–0.1)
Basophils Relative: 0 %
Eosinophils Absolute: 0 10*3/uL (ref 0.0–0.5)
Eosinophils Relative: 0 %
HCT: 34.7 % — ABNORMAL LOW (ref 36.0–46.0)
Hemoglobin: 11.3 g/dL — ABNORMAL LOW (ref 12.0–15.0)
Immature Granulocytes: 1 %
Lymphocytes Relative: 8 %
Lymphs Abs: 1.1 10*3/uL (ref 0.7–4.0)
MCH: 29.8 pg (ref 26.0–34.0)
MCHC: 32.6 g/dL (ref 30.0–36.0)
MCV: 91.6 fL (ref 80.0–100.0)
Monocytes Absolute: 0.8 10*3/uL (ref 0.1–1.0)
Monocytes Relative: 6 %
Neutro Abs: 12.6 10*3/uL — ABNORMAL HIGH (ref 1.7–7.7)
Neutrophils Relative %: 85 %
Platelets: 183 10*3/uL (ref 150–400)
RBC: 3.79 MIL/uL — ABNORMAL LOW (ref 3.87–5.11)
RDW: 14.4 % (ref 11.5–15.5)
WBC: 14.8 10*3/uL — ABNORMAL HIGH (ref 4.0–10.5)
nRBC: 0 % (ref 0.0–0.2)

## 2021-09-01 LAB — RESPIRATORY PANEL BY PCR

## 2021-09-01 LAB — ECHOCARDIOGRAM COMPLETE
AR max vel: 2.45 cm2
AV Area VTI: 2.63 cm2
AV Area mean vel: 2.2 cm2
AV Mean grad: 7 mmHg
AV Peak grad: 12.3 mmHg
Ao pk vel: 1.75 m/s
Area-P 1/2: 4.21 cm2
Height: 61 in
S' Lateral: 3.1 cm
Weight: 2880 oz

## 2021-09-01 LAB — COMPREHENSIVE METABOLIC PANEL
ALT: 22 U/L (ref 0–44)
AST: 19 U/L (ref 15–41)
Albumin: 3 g/dL — ABNORMAL LOW (ref 3.5–5.0)
Alkaline Phosphatase: 64 U/L (ref 38–126)
Anion gap: 7 (ref 5–15)
BUN: 11 mg/dL (ref 8–23)
CO2: 23 mmol/L (ref 22–32)
Calcium: 8 mg/dL — ABNORMAL LOW (ref 8.9–10.3)
Chloride: 107 mmol/L (ref 98–111)
Creatinine, Ser: 0.7 mg/dL (ref 0.44–1.00)
GFR, Estimated: >60 mL/min (ref >60)
Glucose, Bld: 112 mg/dL — ABNORMAL HIGH (ref 70–99)
Potassium: 3.2 mmol/L — ABNORMAL LOW (ref 3.5–5.1)
Sodium: 137 mmol/L (ref 135–145)
Total Bilirubin: 0.7 mg/dL (ref 0.3–1.2)
Total Protein: 5.6 g/dL — ABNORMAL LOW (ref 6.5–8.1)

## 2021-09-01 LAB — MAGNESIUM: Magnesium: 1.8 mg/dL (ref 1.7–2.4)

## 2021-09-01 LAB — PHOSPHORUS: Phosphorus: 3 mg/dL (ref 2.5–4.6)

## 2021-09-01 MED ORDER — BENZONATATE 100 MG PO CAPS
200.0000 mg | ORAL_CAPSULE | Freq: Three times a day (TID) | ORAL | Status: DC | PRN
  Administered 2021-09-02 – 2021-09-04 (×2): 200 mg via ORAL
  Filled 2021-09-01 (×2): qty 2

## 2021-09-01 MED ORDER — LORAZEPAM 2 MG/ML IJ SOLN
1.0000 mg | Freq: Once | INTRAMUSCULAR | Status: AC
  Administered 2021-09-01: 1 mg via INTRAVENOUS

## 2021-09-01 MED ORDER — FLUTICASONE PROPIONATE 50 MCG/ACT NA SUSP
2.0000 | Freq: Every day | NASAL | Status: DC
  Administered 2021-09-01 – 2021-09-05 (×5): 2 via NASAL
  Filled 2021-09-01: qty 16

## 2021-09-01 MED ORDER — VERAPAMIL HCL ER 240 MG PO TBCR
240.0000 mg | EXTENDED_RELEASE_TABLET | Freq: Every day | ORAL | Status: DC
  Administered 2021-09-02 – 2021-09-04 (×3): 240 mg via ORAL
  Filled 2021-09-01 (×4): qty 1

## 2021-09-01 MED ORDER — AMPICILLIN SODIUM 2 G IJ SOLR
2.0000 g | INTRAMUSCULAR | Status: DC
  Administered 2021-09-01 – 2021-09-04 (×17): 2 g via INTRAVENOUS
  Filled 2021-09-01 (×10): qty 2000
  Filled 2021-09-01: qty 2
  Filled 2021-09-01 (×9): qty 2000

## 2021-09-01 MED ORDER — SODIUM CHLORIDE 0.9 % IV SOLN
500.0000 mg | Freq: Every day | INTRAVENOUS | Status: DC
  Filled 2021-09-01: qty 5

## 2021-09-01 MED ORDER — SODIUM CHLORIDE 0.9 % IV SOLN
2.0000 g | Freq: Two times a day (BID) | INTRAVENOUS | Status: DC
  Administered 2021-09-01 – 2021-09-04 (×6): 2 g via INTRAVENOUS
  Filled 2021-09-01 (×7): qty 20

## 2021-09-01 MED ORDER — DARIFENACIN HYDROBROMIDE ER 7.5 MG PO TB24
7.5000 mg | ORAL_TABLET | Freq: Every day | ORAL | Status: DC
Start: 2021-09-01 — End: 2021-09-05
  Administered 2021-09-02 – 2021-09-05 (×4): 7.5 mg via ORAL
  Filled 2021-09-01 (×5): qty 1

## 2021-09-01 MED ORDER — POTASSIUM CHLORIDE CRYS ER 20 MEQ PO TBCR
40.0000 meq | EXTENDED_RELEASE_TABLET | Freq: Every day | ORAL | Status: DC
  Filled 2021-09-01: qty 2

## 2021-09-01 MED ORDER — SODIUM CHLORIDE 0.9 % IV SOLN
2.0000 g | Freq: Every day | INTRAVENOUS | Status: DC
  Filled 2021-09-01: qty 20

## 2021-09-01 MED ORDER — DEXTROSE 5 % IV SOLN
10.0000 mg/kg | Freq: Three times a day (TID) | INTRAVENOUS | Status: DC
  Administered 2021-09-01 – 2021-09-02 (×4): 615 mg via INTRAVENOUS
  Filled 2021-09-01 (×4): qty 12.3
  Filled 2021-09-01: qty 9
  Filled 2021-09-01: qty 12.3

## 2021-09-01 MED ORDER — ENSURE ENLIVE PO LIQD
237.0000 mL | Freq: Two times a day (BID) | ORAL | Status: DC
  Administered 2021-09-02 – 2021-09-03 (×3): 237 mL via ORAL

## 2021-09-01 MED ORDER — VANCOMYCIN HCL 750 MG/150ML IV SOLN
750.0000 mg | Freq: Two times a day (BID) | INTRAVENOUS | Status: DC
  Filled 2021-09-01: qty 150

## 2021-09-01 MED ORDER — ADULT MULTIVITAMIN W/MINERALS CH
1.0000 | ORAL_TABLET | Freq: Every day | ORAL | Status: DC
  Administered 2021-09-02 – 2021-09-05 (×4): 1 via ORAL
  Filled 2021-09-01 (×5): qty 1

## 2021-09-01 MED ORDER — GADOBUTROL 1 MMOL/ML IV SOLN
8.0000 mL | Freq: Once | INTRAVENOUS | Status: AC | PRN
  Administered 2021-09-01: 8 mL via INTRAVENOUS

## 2021-09-01 MED ORDER — PANTOPRAZOLE SODIUM 40 MG PO TBEC
80.0000 mg | DELAYED_RELEASE_TABLET | Freq: Every day | ORAL | Status: DC
Start: 2021-09-01 — End: 2021-09-05
  Administered 2021-09-02 – 2021-09-05 (×4): 80 mg via ORAL
  Filled 2021-09-01 (×4): qty 2

## 2021-09-01 MED ORDER — CYANOCOBALAMIN 250 MCG PO TABS
500.0000 ug | ORAL_TABLET | Freq: Every day | ORAL | Status: DC
  Administered 2021-09-02 – 2021-09-05 (×4): 500 ug via ORAL
  Filled 2021-09-01 (×4): qty 2

## 2021-09-01 MED ORDER — IBUPROFEN 200 MG PO TABS
600.0000 mg | ORAL_TABLET | Freq: Three times a day (TID) | ORAL | Status: DC | PRN
  Administered 2021-09-01 – 2021-09-02 (×2): 600 mg via ORAL
  Filled 2021-09-01 (×2): qty 3

## 2021-09-01 MED ORDER — SODIUM CHLORIDE 0.9 % IV SOLN: 75.0000 mL/h | INTRAVENOUS | Status: DC

## 2021-09-01 NOTE — Consult Note (Signed)
[facility]  Total days of antibiotics 2               Reason for Consult: AMS   Referring Physician: samtani   Principal Problem:   SIRS (systemic inflammatory response syndrome) (HCC) Active Problems:   Dyslipidemia   Morbid obesity (HCC)   Essential hypertension   Sinusitis, chronic   Constipation   Severe sepsis (HCC)   Acute metabolic encephalopathy   Abnormal microbiological findings in CSF   Frequent PVCs    HPI: [name] is a 77 y.o. female  with hx of HTN, dislipidemia, hx of fall in January, now admitted for acute onset mental status change,confusio, with fever up to 103F. On admit, her WBC was 14.8K, started on acyclovir, ceftriaxone, vancomycin. She underwent cxr, abd/pelvic CT- unrevealing. NCHCT showing left sided frontal hematoma residual from recent fall. Brain MRI- remote lacunar infarct, and chronic small vessel disease. LP showed CSF analysis initially with 820 RBC cleared to 20 on 4th tube, no WBC, with normal glu and prot. Gram stain though showed GPC on CSF. The first 24 hrs of hospitalization, she is still encephalopathic, afebrile, but unable to recall name,date,location. No recent rashes or sick contacts, no new medication ingested per her daugther's report.    Past Medical History:  Diagnosis Date   Anemia    Anxiety    B12 DEFICIENCY    Blood transfusion without reported diagnosis    Cataract    Cholecystitis, unspecified    Chronic sinusitis    DEPRESSION    Diastolic dysfunction    DIVERTICULOSIS, COLON    DYSLIPIDEMIA    Dysrhythmia    PVC's   Fibromyalgia    GERD    GERD (gastroesophageal reflux disease)    Hyperplastic colon polyp    HYPERTENSION    INSOMNIA, PERSISTENT    Morbid obesity (HCC)    OSTEOARTHRITIS    Osteoporosis    PONV (postoperative nausea and vomiting)    Skin cancer 2018   upper lip    SYNCOPE    Ulcer of esophagus    VITAMIN D DEFICIENCY     Allergies:  Allergies  Allergen  Reactions   Levaquin [Levofloxacin] Nausea Only   Omnicef [Cefdinir] Nausea And Vomiting    diarrhea    MEDICATIONS:  darifenacin  7.5 mg Oral Daily   feeding supplement  237 mL Oral BID BM   fluticasone  2 spray Each Nare Daily   multivitamin with minerals  1 tablet Oral Daily   pantoprazole  80 mg Oral Daily   polyethylene glycol  17 g Oral Daily   potassium chloride  40 mEq Oral Daily   verapamil  240 mg Oral QHS   vitamin B-12  500 mcg Oral Daily    Social History   Tobacco Use   Smoking status: Never   Smokeless tobacco: Never  Vaping Use   Vaping Use: Never used  Substance Use Topics   Alcohol use: No   Drug use: No    Family History  Problem Relation Age of Onset   Emphysema Father    Heart disease Father    Arthritis Mother    Hypertension Mother        pulmonary   Heart disease Mother    Colon cancer Maternal Aunt    Diabetes Sister    Lung cancer Sister    Colon cancer Maternal Uncle    Colon cancer Other  nephew   Stomach cancer Other    Diabetes Maternal Aunt        x 4   Diabetes Maternal Uncle        x 3   Diabetes Paternal Uncle         1   Kidney disease Other        niece   Diabetes Sister    COPD Sister    Heart Problems Daughter    Diabetes Daughter    Breast cancer Paternal Aunt 46   Esophageal cancer Neg Hx    Liver disease Neg Hx    Rectal cancer Neg Hx     Review of Systems -  Unable to obtain since encephalopathic  OBJECTIVE: Temp:  [98.7 F (37.1 C)-102.8 F (39.3 C)] 99.4 F (37.4 C) (03/17 1334) Pulse Rate:  [55-111] 56 (03/17 1334) Resp:  [11-26] 18 (03/17 1334) BP: (99-156)/(52-112) 156/75 (03/17 1334) SpO2:  [92 %-100 %] 100 % (03/17 1334) Weight:  [81.6 kg] 81.6 kg (03/16 1953) Physical Exam  Constitutional:  alert but not following commands . appears well-developed and well-nourished. No distress.  HENT: [place]/AT, PERRLA, no scleral icterus Mouth/Throat: Oropharynx is clear and moist. No oropharyngeal  exudate.  Cardiovascular: Normal rate, regular rhythm and normal heart sounds. Exam reveals no gallop and no friction rub.  No murmur heard.  Pulmonary/Chest: Effort normal and breath sounds normal. No respiratory distress.  has no wheezes.  Neck = supple,  nuchal rigidity Abdominal: Soft. Bowel sounds are normal.  exhibits no distension. There is no tenderness.  Lymphadenopathy: no cervical adenopathy. No axillary adenopathy Neurological: alert but not following commands Skin: Skin is warm and dry. No rash noted. No erythema.  Psychiatric: a normal mood and affect.  behavior is normal.    LABS: Results for orders placed or performed during the hospital encounter of 08/31/21 (from the past 48 hour(s))  CBG monitoring, ED     Status: Abnormal   Collection Time: 08/31/21  5:08 PM  Result Value Ref Range   Glucose-Capillary 100 (H) 70 - 99 mg/dL    Comment: Glucose reference range applies only to samples taken after fasting for at least 8 hours.  Resp Panel by RT-PCR (Flu A&B, Covid) Nasopharyngeal Swab     Status: None   Collection Time: 08/31/21  5:15 PM   Specimen: Nasopharyngeal Swab; Nasopharyngeal(NP) swabs in vial transport medium  Result Value Ref Range   SARS Coronavirus 2 by RT PCR NEGATIVE NEGATIVE    Comment: (NOTE) SARS-CoV-2 target nucleic acids are NOT DETECTED.  The SARS-CoV-2 RNA is generally detectable in upper respiratory specimens during the acute phase of infection. The lowest concentration of SARS-CoV-2 viral copies this assay can detect is 138 copies/mL. A negative result does not preclude SARS-Cov-2 infection and should not be used as the sole basis for treatment or other patient management decisions. A negative result may occur with  improper specimen collection/handling, submission of specimen other than nasopharyngeal swab, presence of viral mutation(s) within the areas targeted by this assay, and inadequate number of viral copies(<138 copies/mL). A  negative result must be combined with clinical observations, patient history, and epidemiological information. The expected result is Negative.  Fact Sheet for Patients:  BloggerCourse.com  Fact Sheet for Healthcare Providers:  SeriousBroker.it  This test is no t yet approved or cleared by the [place] FDA and  has been authorized for detection and/or diagnosis of SARS-CoV-2 by FDA under an Emergency Use Authorization (EUA). This EUA will  remain  in effect (meaning this test can be used) for the duration of the COVID-19 declaration under Section 564(b)(1) of the Act, 21 U.S.C.section 360bbb-3(b)(1), unless the authorization is terminated  or revoked sooner.       Influenza A by PCR NEGATIVE NEGATIVE   Influenza B by PCR NEGATIVE NEGATIVE    Comment: (NOTE) The Xpert Xpress SARS-CoV-2/FLU/RSV plus assay is intended as an aid in the diagnosis of influenza from Nasopharyngeal swab specimens and should not be used as a sole basis for treatment. Nasal washings and aspirates are unacceptable for Xpert Xpress SARS-CoV-2/FLU/RSV testing.  Fact Sheet for Patients: BloggerCourse.com  Fact Sheet for Healthcare Providers: SeriousBroker.it  This test is not yet approved or cleared by the [place] FDA and has been authorized for detection and/or diagnosis of SARS-CoV-2 by FDA under an Emergency Use Authorization (EUA). This EUA will remain in effect (meaning this test can be used) for the duration of the COVID-19 declaration under Section 564(b)(1) of the Act, 21 U.S.C. section 360bbb-3(b)(1), unless the authorization is terminated or revoked.  Performed at [facility], [address], [place], [place] [address]   Protime-INR     Status: None   Collection Time: 08/31/21  5:15 PM  Result Value Ref Range   Prothrombin Time 14.2 11.4 - 15.2 seconds   INR 1.1  0.8 - 1.2    Comment: (NOTE) INR goal varies based on device and disease states. Performed at [facility], [address], [place], [place] [address]   APTT     Status: None   Collection Time: 08/31/21  5:15 PM  Result Value Ref Range   aPTT 36 24 - 36 seconds    Comment: Performed at [facility], [address], [place], [place] [address]  CBC     Status: Abnormal   Collection Time: 08/31/21  5:15 PM  Result Value Ref Range   WBC 16.6 (H) 4.0 - 10.5 K/uL   RBC 4.11 3.87 - 5.11 MIL/uL   Hemoglobin 12.1 12.0 - 15.0 g/dL   HCT 91.4 78.2 - 95.6 %   MCV 91.7 80.0 - 100.0 fL   MCH 29.4 26.0 - 34.0 pg   MCHC 32.1 30.0 - 36.0 g/dL   RDW 21.3 08.6 - 57.8 %   Platelets 215 150 - 400 K/uL   nRBC 0.0 0.0 - 0.2 %    Comment: Performed at [facility], [address] [place], [place] [address]  Differential     Status: Abnormal   Collection Time: 08/31/21  5:15 PM  Result Value Ref Range   Neutrophils Relative % 89 %   Neutro Abs 14.8 (H) 1.7 - 7.7 K/uL   Lymphocytes Relative 5 %   Lymphs Abs 0.9 0.7 - 4.0 K/uL   Monocytes Relative 5 %   Monocytes Absolute 0.8 0.1 - 1.0 K/uL   Eosinophils Relative 0 %   Eosinophils Absolute 0.0 0.0 - 0.5 K/uL   Basophils Relative 0 %   Basophils Absolute 0.1 0.0 - 0.1 K/uL   Immature Granulocytes 1 %   Abs Immature Granulocytes 0.11 (H) 0.00 - 0.07 K/uL    Comment: Performed at [facility], [address], [place], [place] [address]  Comprehensive metabolic panel     Status: Abnormal   Collection Time: 08/31/21  5:15 PM  Result Value Ref Range   Sodium 136 135 - 145 mmol/L   Potassium 3.5 3.5 - 5.1 mmol/L  Chloride 104 98 - 111 mmol/L   CO2 25 22 - 32 mmol/L   Glucose, Bld 109 (H) 70 - 99 mg/dL    Comment: Glucose reference range applies only to samples taken after fasting for at least 8 hours.   BUN 13 8 - 23 mg/dL   Creatinine, Ser 4.40 0.44 - 1.00 mg/dL    Calcium 8.5 (L) 8.9 - 10.3 mg/dL   Total Protein 6.7 6.5 - 8.1 g/dL   Albumin 3.8 3.5 - 5.0 g/dL   AST 13 (L) 15 - 41 U/L   ALT 8 0 - 44 U/L   Alkaline Phosphatase 74 38 - 126 U/L   Total Bilirubin 0.7 0.3 - 1.2 mg/dL   GFR, Estimated >34 >74 mL/min    Comment: (NOTE) Calculated using the CKD-EPI Creatinine Equation (2021)    Anion gap 7 5 - 15    Comment: Performed at [facility], [address] [place], [place] [address]  Urine rapid drug screen (hosp performed)     Status: None   Collection Time: 08/31/21  5:15 PM  Result Value Ref Range   Opiates NONE DETECTED NONE DETECTED   Cocaine NONE DETECTED NONE DETECTED   Benzodiazepines NONE DETECTED NONE DETECTED   Amphetamines NONE DETECTED NONE DETECTED   Tetrahydrocannabinol NONE DETECTED NONE DETECTED   Barbiturates NONE DETECTED NONE DETECTED    Comment: (NOTE) DRUG SCREEN FOR MEDICAL PURPOSES ONLY.  IF CONFIRMATION IS NEEDED FOR ANY PURPOSE, NOTIFY LAB WITHIN 5 DAYS.  LOWEST DETECTABLE LIMITS FOR URINE DRUG SCREEN Drug Class                     Cutoff (ng/mL) Amphetamine and metabolites    1000 Barbiturate and metabolites    200 Benzodiazepine                 200 Tricyclics and metabolites     300 Opiates and metabolites        300 Cocaine and metabolites        300 THC                            50 Performed at [facility], [address], [place], [place] [address]   Urinalysis, Routine w reflex microscopic Urine, Catheterized     Status: Abnormal   Collection Time: 08/31/21  5:15 PM  Result Value Ref Range   Color, Urine YELLOW YELLOW   APPearance CLEAR CLEAR   Specific Gravity, Urine 1.021 1.005 - 1.030   pH 5.0 5.0 - 8.0   Glucose, UA NEGATIVE NEGATIVE mg/dL   Hgb urine dipstick NEGATIVE NEGATIVE   Bilirubin Urine NEGATIVE NEGATIVE   Ketones, ur 80 (A) NEGATIVE mg/dL   Protein, ur NEGATIVE NEGATIVE mg/dL   Nitrite NEGATIVE NEGATIVE   Leukocytes,Ua NEGATIVE NEGATIVE     Comment: Performed at [facility], [address], [place], [place] [address]  Procalcitonin - Baseline     Status: None   Collection Time: 08/31/21  5:15 PM  Result Value Ref Range   Procalcitonin <0.10 ng/mL    Comment:        Interpretation: PCT (Procalcitonin) <= 0.5 ng/mL: Systemic infection (sepsis) is not likely. Local bacterial infection is possible. (NOTE)       Sepsis PCT Algorithm           Lower Respiratory Tract  Infection PCT Algorithm    ----------------------------     ----------------------------         PCT < 0.25 ng/mL                PCT < 0.10 ng/mL          Strongly encourage             Strongly discourage   discontinuation of antibiotics    initiation of antibiotics    ----------------------------     -----------------------------       PCT 0.25 - 0.50 ng/mL            PCT 0.10 - 0.25 ng/mL               OR       >80% decrease in PCT            Discourage initiation of                                            antibiotics      Encourage discontinuation           of antibiotics    ----------------------------     -----------------------------         PCT >= 0.50 ng/mL              PCT 0.26 - 0.50 ng/mL               AND        <80% decrease in PCT             Encourage initiation of                                             antibiotics       Encourage continuation           of antibiotics    ----------------------------     -----------------------------        PCT >= 0.50 ng/mL                  PCT > 0.50 ng/mL               AND         increase in PCT                  Strongly encourage                                      initiation of antibiotics    Strongly encourage escalation           of antibiotics                                     -----------------------------                                           PCT <= 0.25 ng/mL  OR                                         > 80% decrease in PCT                                      Discontinue / Do not initiate                                             antibiotics  Performed at [facility], [address] [place], [place] [address]   Respiratory (~20 pathogens) panel by PCR     Status: None   Collection Time: 08/31/21  5:18 PM   Specimen: Nasopharyngeal Swab; Respiratory  Result Value Ref Range   Adenovirus NOT DETECTED NOT DETECTED   Coronavirus 229E NOT DETECTED NOT DETECTED    Comment: (NOTE) The Coronavirus on the Respiratory Panel, DOES NOT test for the novel  Coronavirus (2019 nCoV)    Coronavirus HKU1 NOT DETECTED NOT DETECTED   Coronavirus NL63 NOT DETECTED NOT DETECTED   Coronavirus OC43 NOT DETECTED NOT DETECTED   Metapneumovirus NOT DETECTED NOT DETECTED   Rhinovirus / Enterovirus NOT DETECTED NOT DETECTED   Influenza A NOT DETECTED NOT DETECTED   Influenza B NOT DETECTED NOT DETECTED   Parainfluenza Virus 1 NOT DETECTED NOT DETECTED   Parainfluenza Virus 2 NOT DETECTED NOT DETECTED   Parainfluenza Virus 3 NOT DETECTED NOT DETECTED   Parainfluenza Virus 4 NOT DETECTED NOT DETECTED   Respiratory Syncytial Virus NOT DETECTED NOT DETECTED   Bordetella pertussis NOT DETECTED NOT DETECTED   Bordetella Parapertussis NOT DETECTED NOT DETECTED   Chlamydophila pneumoniae NOT DETECTED NOT DETECTED   Mycoplasma pneumoniae NOT DETECTED NOT DETECTED    Comment: Performed at [facility], [address], [place], [place] [address]  I-stat chem 8, ED     Status: Abnormal   Collection Time: 08/31/21  5:21 PM  Result Value Ref Range   Sodium 138 135 - 145 mmol/L   Potassium 3.6 3.5 - 5.1 mmol/L   Chloride 102 98 - 111 mmol/L   BUN 11 8 - 23 mg/dL   Creatinine, Ser 2.95 0.44 - 1.00 mg/dL   Glucose, Bld 621 (H) 70 - 99 mg/dL    Comment: Glucose reference range applies only to samples taken after fasting for at least 8 hours.   Calcium, Ion 1.14  (L) 1.15 - 1.40 mmol/L   TCO2 25 22 - 32 mmol/L   Hemoglobin 12.6 12.0 - 15.0 g/dL   HCT 30.8 65.7 - 84.6 %  Lactic acid, plasma     Status: None   Collection Time: 08/31/21  6:12 PM  Result Value Ref Range   Lactic Acid, Venous 0.8 0.5 - 1.9 mmol/L    Comment: Performed at [facility], [address] [place], [place] [address]  Ammonia     Status: None   Collection Time: 08/31/21  6:27 PM  Result Value Ref Range   Ammonia 13 9 - 35 umol/L    Comment: Performed at [facility], [address], [place], [place] [address]  Culture, blood (routine x 2)  Status: None (Preliminary result)   Collection Time: 08/31/21  6:30 PM   Specimen: BLOOD  Result Value Ref Range   Specimen Description      BLOOD RIGHT ANTECUBITAL Performed at [facility], [address] [place], [place] [address]    Special Requests      BOTTLES DRAWN AEROBIC AND ANAEROBIC Blood Culture adequate volume Performed at [facility], [address] [place], [place] [address]    Culture      NO GROWTH < 24 HOURS Performed at [facility], [address] [place], [place] [address]    Report Status PENDING   Culture, blood (routine x 2)     Status: None (Preliminary result)   Collection Time: 08/31/21  6:35 PM   Specimen: BLOOD  Result Value Ref Range   Specimen Description      BLOOD LEFT ANTECUBITAL Performed at [facility], [address] [place], [place] [address]    Special Requests      BOTTLES DRAWN AEROBIC AND ANAEROBIC Blood Culture adequate volume Performed at [facility], [address] [place], [place] [address]    Culture      NO GROWTH < 24 HOURS Performed at [facility], [address] [place], [place] [address]    Report Status PENDING   Ethanol     Status: None   Collection Time: 08/31/21  7:42 PM  Result Value Ref Range   Alcohol, Ethyl (B) <10 <10  mg/dL    Comment: (NOTE) Lowest detectable limit for serum alcohol is 10 mg/dL.  For medical purposes only. Performed at [facility], [address], [place], [place] [address]   Lactic acid, plasma     Status: None   Collection Time: 08/31/21  7:42 PM  Result Value Ref Range   Lactic Acid, Venous 0.7 0.5 - 1.9 mmol/L    Comment: Performed at [facility], [address], [place], [place] [address]  Lipid panel     Status: None   Collection Time: 08/31/21  7:42 PM  Result Value Ref Range   Cholesterol 141 0 - 200 mg/dL   Triglycerides 54 <884 mg/dL   HDL 48 >16 mg/dL   Total CHOL/HDL Ratio 2.9 RATIO   VLDL 11 0 - 40 mg/dL   LDL Cholesterol 82 0 - 99 mg/dL    Comment:        Total Cholesterol/HDL:CHD Risk Coronary Heart Disease Risk Table                     Men   Women  1/2 Average Risk   3.4   3.3  Average Risk       5.0   4.4  2 X Average Risk   9.6   7.1  3 X Average Risk  23.4   11.0        Use the calculated Patient Ratio above and the CHD Risk Table to determine the patient's CHD Risk.        ATP III CLASSIFICATION (LDL):  <100     mg/dL   Optimal  606-301  mg/dL   Near or Above                    Optimal  130-159  mg/dL   Borderline  601-093  mg/dL   High  >235     mg/dL   Very High Performed  at [facility], [address] [place], [place] [address]   CSF cell count with differential collection tube #: 1     Status: Abnormal   Collection Time: 08/31/21  8:50 PM  Result Value Ref Range   Tube # 1    Color, CSF PINK (A) COLORLESS   Appearance, CSF CLEAR (A) CLEAR   Supernatant CLEAR    RBC Count, CSF 820 (H) 0 /cu mm    Comment: CORRECTED ON 03/16 AT 2207: PREVIOUSLY REPORTED AS 82   WBC, CSF 0 0 - 5 /cu mm   Other Cells, CSF TOO FEW TO COUNT, SMEAR AVAILABLE FOR REVIEW     Comment: 5 LYMPHS 3 NEUTROPHILS Performed at [facility], [address] [place], [place] [address]    CSF cell count with differential collection tube #: 4     Status: Abnormal   Collection Time: 08/31/21  8:50 PM  Result Value Ref Range   Tube # 4    Color, CSF COLORLESS COLORLESS   Appearance, CSF CLEAR (A) CLEAR   Supernatant CLEAR    RBC Count, CSF 20 (H) 0 /cu mm   WBC, CSF 0 0 - 5 /cu mm   Other Cells, CSF TOO FEW TO COUNT, SMEAR AVAILABLE FOR REVIEW     Comment: Performed at [facility], [address], [place], [place] [address]  Glucose, CSF     Status: None   Collection Time: 08/31/21  8:50 PM  Result Value Ref Range   Glucose, CSF 65 40 - 70 mg/dL    Comment: Performed at [facility], [address] [place], [place] [address]  Protein, CSF     Status: None   Collection Time: 08/31/21  8:50 PM  Result Value Ref Range   Total  Protein, CSF 27 15 - 45 mg/dL    Comment: Performed at [facility], [address] [place], [place] [address]  CSF culture     Status: None (Preliminary result)   Collection Time: 08/31/21  8:51 PM   Specimen: Back; Cerebrospinal Fluid  Result Value Ref Range   Specimen Description      BACK Performed at [facility], [address], [place], [place] [address]    Special Requests      NONE Performed at [facility], [address] [place], [place] [address]    Gram Stain      NO WBC SEEN RARE GRAM POSITIVE COCCI Gram Stain Report Called to,Read Back By and Verified With: RN J NASH AT 2147 08/31/21 CRUICKSHANK A     Culture PENDING    Report Status PENDING   Protein and glucose, CSF     Status: None   Collection Time: 08/31/21  8:51 PM  Result Value Ref Range   Glucose, CSF 66 40 - 70 mg/dL   Total  Protein, CSF 26 15 - 45 mg/dL    Comment: Performed at [facility], [address] [place], [place] [address]  CK     Status: Abnormal   Collection Time: 08/31/21 10:23 PM  Result Value Ref Range   Total CK 33 (L) 38 -  234 U/L    Comment: Performed at [facility], [address] [place], [place] [address]  Hepatic function panel     Status: Abnormal   Collection Time: 08/31/21 10:23 PM  Result Value Ref Range   Total Protein 5.7 (  L) 6.5 - 8.1 g/dL   Albumin 3.0 (L) 3.5 - 5.0 g/dL   AST 11 (L) 15 - 41 U/L   ALT 9 0 - 44 U/L   Alkaline Phosphatase 59 38 - 126 U/L   Total Bilirubin 0.7 0.3 - 1.2 mg/dL   Bilirubin, Direct 0.1 0.0 - 0.2 mg/dL   Indirect Bilirubin 0.6 0.3 - 0.9 mg/dL    Comment: Performed at [facility], [address] [place], [place] [address]  Magnesium     Status: None   Collection Time: 08/31/21 10:23 PM  Result Value Ref Range   Magnesium 1.7 1.7 - 2.4 mg/dL    Comment: Performed at [facility], [address], [place], [place] [address]  Phosphorus     Status: None   Collection Time: 08/31/21 10:23 PM  Result Value Ref Range   Phosphorus 4.3 2.5 - 4.6 mg/dL    Comment: Performed at [facility], [address], [place], [place] [address]  TSH     Status: None   Collection Time: 08/31/21 10:23 PM  Result Value Ref Range   TSH 0.504 0.350 - 4.500 uIU/mL    Comment: Performed by a 3rd Generation assay with a functional sensitivity of <=0.01 uIU/mL. Performed at [facility], [address] [place], [place] [address]   Blood gas, venous     Status: Abnormal   Collection Time: 08/31/21 10:44 PM  Result Value Ref Range   pH, Ven 7.42 7.25 - 7.43   pCO2, Ven 34 (L) 44 - 60 mmHg   pO2, Ven 118 (H) 32 - 45 mmHg   Bicarbonate 22.1 20.0 - 28.0 mmol/L   Acid-base deficit 1.7 0.0 - 2.0 mmol/L   O2 Saturation 97.5 %   Patient temperature 37.0     Comment: Performed at [facility], [address], [place], [place] [address]  Magnesium     Status: None   Collection Time: 09/01/21  4:44 AM  Result Value Ref Range   Magnesium 1.8 1.7 - 2.4 mg/dL    Comment: Performed  at [facility], [address], [place], [place] [address]  Phosphorus     Status: None   Collection Time: 09/01/21  4:44 AM  Result Value Ref Range   Phosphorus 3.0 2.5 - 4.6 mg/dL    Comment: Performed at [facility], [address], [place], [place] [address]  CBC WITH DIFFERENTIAL     Status: Abnormal   Collection Time: 09/01/21  4:44 AM  Result Value Ref Range   WBC 14.8 (H) 4.0 - 10.5 K/uL   RBC 3.79 (L) 3.87 - 5.11 MIL/uL   Hemoglobin 11.3 (L) 12.0 - 15.0 g/dL   HCT 44.0 (L) 34.7 - 42.5 %   MCV 91.6 80.0 - 100.0 fL   MCH 29.8 26.0 - 34.0 pg   MCHC 32.6 30.0 - 36.0 g/dL   RDW 95.6 38.7 - 56.4 %   Platelets 183 150 - 400 K/uL   nRBC 0.0 0.0 - 0.2 %   Neutrophils Relative % 85 %   Neutro Abs 12.6 (H) 1.7 - 7.7 K/uL   Lymphocytes Relative 8 %   Lymphs Abs 1.1 0.7 - 4.0 K/uL   Monocytes Relative 6 %   Monocytes Absolute 0.8 0.1 - 1.0 K/uL   Eosinophils Relative 0 %   Eosinophils Absolute 0.0 0.0 - 0.5 K/uL   Basophils Relative 0 %   Basophils  Absolute 0.1 0.0 - 0.1 K/uL   Immature Granulocytes 1 %   Abs Immature Granulocytes 0.13 (H) 0.00 - 0.07 K/uL    Comment: Performed at [facility], [address] [place], [place] [address]  Comprehensive metabolic panel     Status: Abnormal   Collection Time: 09/01/21  4:44 AM  Result Value Ref Range   Sodium 137 135 - 145 mmol/L   Potassium 3.2 (L) 3.5 - 5.1 mmol/L   Chloride 107 98 - 111 mmol/L   CO2 23 22 - 32 mmol/L   Glucose, Bld 112 (H) 70 - 99 mg/dL    Comment: Glucose reference range applies only to samples taken after fasting for at least 8 hours.   BUN 11 8 - 23 mg/dL   Creatinine, Ser 1.02 0.44 - 1.00 mg/dL   Calcium 8.0 (L) 8.9 - 10.3 mg/dL   Total Protein 5.6 (L) 6.5 - 8.1 g/dL   Albumin 3.0 (L) 3.5 - 5.0 g/dL   AST 19 15 - 41 U/L   ALT 22 0 - 44 U/L   Alkaline Phosphatase 64 38 - 126 U/L   Total Bilirubin 0.7 0.3 - 1.2 mg/dL   GFR, Estimated >72 >53  mL/min    Comment: (NOTE) Calculated using the CKD-EPI Creatinine Equation (2021)    Anion gap 7 5 - 15    Comment: Performed at [facility], [address], [place], [place] [address]    MICRO:  IMAGING: MR Brain W and Wo Contrast  Result Date: 09/01/2021 CLINICAL DATA:  75 year old female code stroke presentation yesterday. Abnormal speech. EXAM: MRI HEAD WITHOUT AND WITH CONTRAST TECHNIQUE: Multiplanar, multiecho pulse sequences of the brain and surrounding structures were obtained without and with intravenous contrast. CONTRAST:  8mL GADAVIST GADOBUTROL 1 MMOL/ML IV SOLN COMPARISON:  Head CT 08/31/2021.  Brain MRI 05/06/2018. FINDINGS: Study is mildly degraded by motion artifact despite repeated imaging attempts. Brain: No restricted diffusion to suggest acute infarction. No midline shift, mass effect, evidence of mass lesion, ventriculomegaly, extra-axial collection or acute intracranial hemorrhage. Cervicomedullary junction and pituitary are within normal limits. Progressed since 2019 and now moderately advanced patchy T2 and FLAIR hyperintensity scattered in the bilateral cerebral white matter, especially the corona radiata, and involving some of the anterior deep white matter capsules on the left. Chronic T2 heterogeneity in the left caudate. No cortical encephalomalacia or chronic cerebral blood products identified. Possible chronic lacunar infarct dorsal left pons. No abnormal enhancement identified.  No dural thickening. Vascular: Major intracranial vascular flow voids are grossly stable. Skull and upper cervical spine: Grossly stable visible cervical spine. Visualized bone marrow signal is within normal limits. Sinuses/Orbits: Stable allowing for motion. Other: Mild left greater than right mastoid effusions are new or increased since 2019. And there is small volume retained secretions in the nasopharynx. IMPRESSION: 1. Mildly motion degraded exam with no acute  intracranial abnormality identified. 2. 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 Electronically Signed   By: Odessa Fleming [name]   On: 09/01/2021 11:41   CT ABDOMEN PELVIS W CONTRAST  Result Date: 08/31/2021 CLINICAL DATA:  Abdominal pain. EXAM: CT ABDOMEN AND PELVIS WITH CONTRAST TECHNIQUE: Multidetector CT imaging of the abdomen and pelvis was performed using the standard protocol following bolus administration of intravenous contrast. RADIATION DOSE REDUCTION: This exam was performed according to the departmental dose-optimization program which includes automated exposure control, adjustment of  the mA and/or kV according to patient size and/or use of iterative reconstruction technique. CONTRAST:  OMNIPAQUE IOHEXOL 300 MG/ML  SOLN COMPARISON:  CT abdomen pelvis dated 02/07/2017. FINDINGS: Lower chest: The visualized lung bases are clear. There is coronary vascular calcification. No intra-abdominal free air or free fluid. Hepatobiliary: Multiple small liver cysts similar to prior CT. No intrahepatic biliary dilatation. Cholecystectomy. No retained calcified stone noted in the central CBD. Pancreas: Unremarkable. No pancreatic ductal dilatation or surrounding inflammatory changes. Spleen: Normal in size without focal abnormality. Adrenals/Urinary Tract: The adrenal glands unremarkable the kidneys, visualized ureters, and urinary bladder appear unremarkable. Stomach/Bowel: Postsurgical changes of the stomach. There is moderate stool throughout the colon. No bowel obstruction or active inflammation. The appendix is not visualized with certainty. No inflammatory changes identified in the right lower quadrant. Vascular/Lymphatic: Advanced aortoiliac atherosclerotic disease. The IVC is unremarkable. No portal venous gas. There is no adenopathy. Reproductive: Hysterectomy.  No adnexal masses. Other: Midline vertical  anterior abdominal wall incisional scar. Musculoskeletal: Osteopenia with degenerative changes of the spine. No acute osseous pathology. IMPRESSION: 1. No acute intra-abdominal or pelvic pathology. 2. Moderate colonic stool burden. No bowel obstruction. 3. Aortic Atherosclerosis (ICD10-I70.0). Electronically Signed   By: Elgie Collard [name]   On: 08/31/2021 20:25   DG Chest Portable 1 View  Result Date: 08/31/2021 CLINICAL DATA:  Altered mental status EXAM: PORTABLE CHEST 1 VIEW COMPARISON:  03/14/2021 FINDINGS: The heart size and mediastinal contours are within normal limits. Both lungs are clear. The visualized skeletal structures are unremarkable. IMPRESSION: Negative. Electronically Signed   By: Charlett Nose [name]   On: 08/31/2021 18:28   ECHOCARDIOGRAM COMPLETE  Result Date: 09/01/2021    ECHOCARDIOGRAM REPORT   Patient Name:   [name] Date of Exam: 09/01/2021 Medical Rec #:  [id number]     Height:       61.0 in Accession #:    [id number]    Weight:       180.0 lb Date of Birth:  1945-08-03      BSA:          1.806 m Patient Age:    75 years      BP:           124/58 mmHg Patient Gender: F             HR:           100 bpm. Exam Location:  Inpatient Procedure: 2D Echo Indications:    Fever  History:        Patient has prior history of Echocardiogram examinations, most                 recent 10/05/2011. Risk Factors:Dyslipidemia and Hypertension.  Sonographer:    Devonne Doughty Referring Phys: [id number] ANASTASSIA DOUTOVA  Sonographer Comments: Image acquisition challenging due to respiratory motion and supine. IMPRESSIONS  1. Left ventricular ejection fraction, by estimation, is 60 to 65%. The left ventricle has normal function. The left ventricle has no regional wall motion abnormalities. Left ventricular diastolic parameters are consistent with Grade I diastolic dysfunction (impaired relaxation).  2. Right ventricular systolic function is normal. The right ventricular size is normal. There is mildly  elevated pulmonary artery systolic pressure. The estimated right ventricular systolic pressure is 37.6 mmHg.  3. The mitral valve is normal in structure. Trivial mitral valve regurgitation. No evidence of mitral stenosis.  4. The aortic valve is tricuspid. Aortic valve regurgitation is not visualized. No aortic stenosis  is present.  5. The inferior vena cava is normal in size with greater than 50% respiratory variability, suggesting right atrial pressure of 3 mmHg.  6. The patient was in ventricular bigeminy. FINDINGS  Left Ventricle: Left ventricular ejection fraction, by estimation, is 60 to 65%. The left ventricle has normal function. The left ventricle has no regional wall motion abnormalities. The left ventricular internal cavity size was normal in size. There is  no left ventricular hypertrophy. Left ventricular diastolic parameters are consistent with Grade I diastolic dysfunction (impaired relaxation). Right Ventricle: The right ventricular size is normal. No increase in right ventricular wall thickness. Right ventricular systolic function is normal. There is mildly elevated pulmonary artery systolic pressure. The tricuspid regurgitant velocity is 2.94  m/s, and with an assumed right atrial pressure of 3 mmHg, the estimated right ventricular systolic pressure is 37.6 mmHg. Left Atrium: Left atrial size was normal in size. Right Atrium: Right atrial size was normal in size. Pericardium: There is no evidence of pericardial effusion. Mitral Valve: The mitral valve is normal in structure. Trivial mitral valve regurgitation. No evidence of mitral valve stenosis. Tricuspid Valve: The tricuspid valve is normal in structure. Tricuspid valve regurgitation is trivial. Aortic Valve: The aortic valve is tricuspid. Aortic valve regurgitation is not visualized. No aortic stenosis is present. Aortic valve mean gradient measures 7.0 mmHg. Aortic valve peak gradient measures 12.2 mmHg. Aortic valve area, by VTI measures 2.63   cm. Pulmonic Valve: The pulmonic valve was normal in structure. Pulmonic valve regurgitation is not visualized. Aorta: The aortic root is normal in size and structure. Venous: The inferior vena cava is normal in size with greater than 50% respiratory variability, suggesting right atrial pressure of 3 mmHg. IAS/Shunts: No atrial level shunt detected by color flow Doppler.  LEFT VENTRICLE PLAX 2D LVIDd:         4.50 cm   Diastology LVIDs:         3.10 cm   LV e' medial:    8.05 cm/s LV PW:         1.00 cm   LV E/e' medial:  10.8 LV IVS:        1.00 cm   LV e' lateral:   11.00 cm/s LVOT diam:     2.10 cm   LV E/e' lateral: 7.9 LV SV:         86 LV SV Index:   48 LVOT Area:     3.46 cm  RIGHT VENTRICLE             IVC RV Basal diam:  3.00 cm     IVC diam: 1.60 cm RV Mid diam:    3.20 cm RV S prime:     15.00 cm/s TAPSE (M-mode): 2.0 cm LEFT ATRIUM           Index        RIGHT ATRIUM           Index LA diam:      3.60 cm 1.99 cm/m   RA Area:     12.90 cm LA Vol (A4C): 44.6 ml 24.69 ml/m  RA Volume:   28.00 ml  15.50 ml/m  AORTIC VALVE AV Area (Vmax):    2.45 cm AV Area (Vmean):   2.20 cm AV Area (VTI):     2.63 cm AV Vmax:           175.00 cm/s AV Vmean:          126.000 cm/s  AV VTI:            0.326 m AV Peak Grad:      12.2 mmHg AV Mean Grad:      7.0 mmHg LVOT Vmax:         124.00 cm/s LVOT Vmean:        80.200 cm/s LVOT VTI:          0.248 m LVOT/AV VTI ratio: 0.76  AORTA Ao Root diam: 3.00 cm Ao Asc diam:  2.80 cm MITRAL VALVE                TRICUSPID VALVE MV Area (PHT): 4.21 cm     TR Peak grad:   34.6 mmHg MV Decel Time: 180 msec     TR Vmax:        294.00 cm/s MV E velocity: 87.00 cm/s MV A velocity: 107.00 cm/s  SHUNTS MV E/A ratio:  0.81         Systemic VTI:  0.25 m                             Systemic Diam: 2.10 cm Dalton McleanMD Electronically signed by Wilfred Lacy Signature Date/Time: 09/01/2021/4:46:16 PM    Final    CT HEAD CODE STROKE WO CONTRAST  Result Date: 08/31/2021 CLINICAL  DATA:  Code stroke. Neuro deficit, acute, stroke suspected. Speech disturbance. EXAM: CT HEAD WITHOUT CONTRAST TECHNIQUE: Contiguous axial images were obtained from the base of the skull through the vertex without intravenous contrast. RADIATION DOSE REDUCTION: This exam was performed according to the departmental dose-optimization program which includes automated exposure control, adjustment of the mA and/or kV according to patient size and/or use of iterative reconstruction technique. COMPARISON:  07/15/2021 FINDINGS: Brain: No sign of acute infarction. No focal abnormality seen affecting the brainstem or cerebellum. 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 No cortical or large vessel territory insult. No mass lesion, hemorrhage, hydrocephalus or extra-axial collection. Vascular: There is atherosclerotic calcification of the major vessels at the base of the brain. Skull: Negative Sinuses/Orbits: Clear/normal Other: Resolution of the previously seen left frontal soft tissue hematoma. ASPECTS [facility] Stroke Program Early CT Score) - Ganglionic level infarction (caudate, lentiform nuclei, internal capsule, insula, M1-M3 cortex): 7 - Supraganglionic infarction (M4-M6 cortex): 3 Total score (0-10 with 10 being normal): 10 IMPRESSION: 1. No acute CT finding. Mild chronic small-vessel ischemic changes as above. 2. Resolution of the previously seen left frontal soft tissue hematoma. 3. ASPECTS is 10 4. These results were called by telephone at the time of interpretation on 08/31/2021 at 5:32 pm to provider Dr. Lawrence Marseilles, who verbally acknowledged these results. Electronically Signed   By: Paulina Fusi [name]   On: 08/31/2021 17:32    HISTORICAL MICRO/IMAGING  Assessment/Plan:  76yo F with new onset encephalopathy, waxing and waning presentation based on different provider's notes, during my  analysis she was not able to follow commands, +nuchal rigidity. CSF analysis does not show pleocytosis but has gpc on gram stain. Leukocytosis could be stress reaction. Ddx includes aseptic meningitis - recommend to continue iv abtx for now until CSF culture return in 48hrs to help de-escalate.  -HSV CSF PCR is pending - will check RPR - follow up on EEG Leukocytosis = repeat cbc tomorrow with diff

## 2021-09-01 NOTE — Progress Notes (Addendum)
Neurology Progress Note ? ?Brief HPI: 76 y.o. female with PMHx of HTN, HLD, CAD, B12 deficiency, and depression who presented to the ED 3/16 for evaluation of confusion, disorientation, and delay of speech (family reports inability of patient to complete sentences). Initial work up included negative CTH, creatinine of 0.70, leukocytosis with a WBC of 16.6, UA without evidence of infection, febrile with a TMAX of 102.8 degrees Farenheit, CXR negative, initial CSF without concern for infectious process. ? ?Subjective: ?Patient's daughter at bedside states that she feels Ms. Notte is at her baseline mental status with some minimal generalized weakness and some lethargy.  ?MRI brain w+wo contrast without acute intracranial abnormality.  ? ?Exam: ?Vitals:  ? 09/01/21 0703 09/01/21 1334  ?BP: (!) 124/58 (!) 156/75  ?Pulse: 100 (!) 56  ?Resp:  18  ?Temp: 100 ?F (37.8 ?C) 99.4 ?F (37.4 ?C)  ?SpO2: 93% 100%  ? ?Gen: Laying comfortably in bed, in no acute distress.  ?Resp: non-labored breathing, no respiratory distress ?Abd: soft, non-tender, non-distended ? ?Neuro: ?Mental Status: Drowsy, wakes to voice. She answers all orientation questions appropriately.  ?Follows commands without difficulty.  ?Speech is fluent without evidence of aphasia or neglect.  ?Cranial Nerves: PERRL, EOMI without ptosis, gaze preference, or nystagmus, facial sensation is intact and symmetric to light touch, face is symmetric resting and smiling, hearing is intact to voice, tongue is midline.  ?Motor: Some minimal generalized weakness throughout noted by patient though on exam, her bilateral upper extremities have 5/5 strength and bilateral lower extremities have 4/5 strength without asymmetry.  ?Sensory: Intact and symmetric to light touch throughout ?Gait: Deferred ? ?Pertinent Labs: ?CBC ?   ?Component Value Date/Time  ? WBC 14.8 (H) 09/01/2021 0444  ? RBC 3.79 (L) 09/01/2021 0444  ? HGB 11.3 (L) 09/01/2021 0444  ? HCT 34.7 (L) 09/01/2021 0444  ?  PLT 183 09/01/2021 0444  ? MCV 91.6 09/01/2021 0444  ? MCH 29.8 09/01/2021 0444  ? MCHC 32.6 09/01/2021 0444  ? RDW 14.4 09/01/2021 0444  ? LYMPHSABS 1.1 09/01/2021 0444  ? MONOABS 0.8 09/01/2021 0444  ? EOSABS 0.0 09/01/2021 0444  ? BASOSABS 0.1 09/01/2021 0444  ? ?CMP  ?   ?Component Value Date/Time  ? NA 137 09/01/2021 0444  ? K 3.2 (L) 09/01/2021 0444  ? CL 107 09/01/2021 0444  ? CO2 23 09/01/2021 0444  ? GLUCOSE 112 (H) 09/01/2021 0444  ? BUN 11 09/01/2021 0444  ? CREATININE 0.70 09/01/2021 0444  ? CALCIUM 8.0 (L) 09/01/2021 0444  ? PROT 5.6 (L) 09/01/2021 0444  ? ALBUMIN 3.0 (L) 09/01/2021 0444  ? AST 19 09/01/2021 0444  ? ALT 22 09/01/2021 0444  ? ALKPHOS 64 09/01/2021 0444  ? BILITOT 0.7 09/01/2021 0444  ? GFRNONAA >60 09/01/2021 0444  ? GFRAA >60 02/16/2017 1046  ? ? Latest Reference Range & Units 08/31/21 20:50 08/31/21 20:51  ?Appearance, CSF CLEAR  ?CLEAR  CLEAR ! ?CLEAR !   ?Glucose, CSF 40 - 70 mg/dL 65 66  ?RBC Count, CSF 0 /cu mm ?0 /cu mm 20 (H) ?820 (H) (C)   ?WBC, CSF 0 - 5 /cu mm ?0 - 5 /cu mm 0 ?0   ?Other Cells, CSF  TOO FEW TO COUNT, SMEAR AVAILABLE FOR REVIEW ?TOO FEW TO COUNT, SMEAR AVAILABLE FOR REVIEW   ?Color, CSF COLORLESS  ?COLORLESS  COLORLESS ?PINK !   ?Supernatant  CLEAR ?CLEAR   ?Total  Protein, CSF 15 - 45 mg/dL 27 26  ?Tube #  4 ?1   ? ? Latest Reference Range & Units 08/31/21 19:42  ?Alcohol, Ethyl (B) <10 mg/dL <10  ? ? Latest Reference Range & Units 08/31/21 22:23  ?CK Total 38 - 234 U/L 33 (L)  ? ?Imaging Reviewed: ? ?MRI brain wwo contrast 3/17: ?1. Mildly motion degraded exam with no acute intracranial abnormality identified. ?2. 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 ? ?Assessment: 76 year old female with history as above who presented to ED for confusion, amnesia, disorientation. CT head no acute abnormality. WBC 16.6, febrile overnight. One month ago patient had a PCP  visit for headache and cough with congestion, concerning for sinusitis, put on Cefdinir.  But patient stopped the medication by herself.  ?  ?Patient not a tPA candidate given no focal deficit, examination more consistent with encephalopathy and barely OSW.  Patient symptoms concerning for encephalopathy with improvement on exam 3/17 especially given recent suspect infection and WBC 16.6. Recommend continued encephalopathy and infectious work-up. Routine EEG pending.  ? ? ?Recommendations: ?- Continue to evaluate and treat for metabolic / infectious derangements per primary team as you are ?- Patient remains on empiric abx with improvement in mental status and some improvement in leukocytosis overnight. ?- EEG pending  ? ?Anibal Henderson, AGACNP-BC ?[facility] ?[phone] ? ?I have seen the patient and reviewed the above note.  She presented with confusion in the setting of signs and symptoms of an infection of unclear source.  CSF is extremely unremarkable, very low suspicion for CNS infection, but there is a gram-positive cocci report on the Gram stain.  Strongly suspect this is contaminant.  HSV can rarely cause infection without pleocytosis, but I think this is also unlikely though it would not be completely unreasonable given her continued confusion to await HSV PCR.  Appreciate ID assistance. ? ?I think that her confusion represents delirium in the setting of her underlying inflammatory response, source of infection is currently unclear. ? ?Roland Rack, MD ?[facility] ?[phone] ? ?If 7pm- 7am, please page neurology on call as listed in [place]. ? ?

## 2021-09-01 NOTE — Progress Notes (Signed)
Echocardiogram ?2D Echocardiogram has been performed. ? ?Arlyss Gandy ?09/01/2021, 12:29 PM ?

## 2021-09-01 NOTE — Progress Notes (Signed)
Initial Nutrition Assessment ? ?DOCUMENTATION CODES:  ? ?Obesity unspecified ? ?INTERVENTION:  ?- will order Ensure Plus High Protein BID, each supplement provides 350 kcal and 20 grams of protein. ?- will order 1 tablet multivitamin with minerals daily ?- complete NFPE when feasible. ?- will liberalize diet from Heart Healthy to Regular as patient does not follow a restrictive diet at home and has lost weight per report.  ? ? ?NUTRITION DIAGNOSIS:  ? ?Increased nutrient needs related to acute illness as evidenced by estimated needs. ? ?GOAL:  ? ?Patient will meet greater than or equal to 90% of their needs ? ?MONITOR:  ? ?PO intake, Supplement acceptance, Labs, Weight trends ? ?REASON FOR ASSESSMENT:  ? ?Malnutrition Screening Tool ? ?ASSESSMENT:  ? ?76 y.o. female with medical history of HTN, HLD, CAD, macular degeneration, cholecystitis, esophageal ulcer, GERD, fibromyalgia, vitamin B12 deficiency, depression, insomnia, osteoarthritis, vitamin D deficiency, anemia, severe constipation, and skin cancer. She presented to the ED due to confusion and delayed speech. She was admitted with dx of essential hypertension and noted to meet criteria for severe sepsis on admission. ? ?Patient laying in bed and flow sheet documentation indicates that she is a/o to self and place. ? ?Her daughter was at bedside and all communication occurred with her. Patient being prepared to be taken to MRI.  ? ?Daughter is unsure of any changes in appetite recently. Patient does not weigh herself at home but when she went to the doctor's about 2 weeks ago she reports being down 5-7 lb from the previous doctor's appointment. ? ?Patient does not follow any diet restrictions at home. Daughter shares that she suspects may could possibly be lactose intolerant and have a gluten sensitivity as these things cause GI distress for her.  ? ?Talked with daughter about the menu and ordering meals.  ? ?No additional nutrition-related questions or  concerns at this time.  ? ?Weight yesterday was 176 lb and weight on 08/04/21 was 175 lb. Weight on 03/14/21 was 177 lb. Weight on 07/18/20 was 201 lb. This indicates 25 lb weight loss (12.4% body weight) in the past 14 months; not significant for time frame.  ? ? ?Labs reviewed; K: 3.2 mmol/l, Ca: 8 mg/dl. ?Medications reviewed; 17 g miralax/day, 40 mEq Klor-Con/day. ?IVF; NS @ 125 ml/hr.  ?  ? ?NUTRITION - FOCUSED PHYSICAL EXAM: ? ?Patient being prepared to go to MRI.  ? ?Diet Order:   ?Diet Order   ? ?       ?  Diet regular Room service appropriate? Yes; Fluid consistency: Thin  Diet effective now       ?  ? ?  ?  ? ?  ? ? ?EDUCATION NEEDS:  ? ?No education needs have been identified at this time ? ?Skin:  Skin Assessment: Reviewed RN Assessment ? ?Last BM:  PTA/unknown ? ?Height:  ? ?Ht Readings from Last 1 Encounters:  ?08/31/21 '5\' 1"'$  (1.549 m)  ? ? ?Weight:  ? ?Wt Readings from Last 1 Encounters:  ?08/31/21 81.6 kg  ? ? ?BMI:  Body mass index is 34.01 kg/m?. ? ?Estimated Nutritional Needs:  ?Kcal:  1700-1900 kcal ?Protein:  80-95 grams ?Fluid:  >/= 1.8 L/day ? ? ? ? ? ?Jarome Matin, MS, RD, LDN ?Registered Dietitian II ?Inpatient Clinical Nutrition ?RD pager # and on-call/weekend pager # available in [place]  ? ?

## 2021-09-01 NOTE — Progress Notes (Signed)
Patient admitted with fever of 102 in the ED. F/U with plan of care. Will continue vitals Q4hrs. ? ? 09/01/21 0337  ?Assess: MEWS Score  ?Temp (!) 101.3 ?F (38.5 ?C)  ?BP (!) 146/52  ?Pulse Rate (!) 111  ?Resp (!) 21  ?Level of Consciousness Alert  ?SpO2 92 %  ?O2 Device Room Air  ?Assess: MEWS Score  ?MEWS Temp 1  ?MEWS Systolic 0  ?MEWS Pulse 2  ?MEWS RR 1  ?MEWS LOC 0  ?MEWS Score 4  ?MEWS Score Color Red  ?Assess: if the MEWS score is Yellow or Red  ?Were vital signs taken at a resting state? Yes  ?Focused Assessment No change from prior assessment ?(Fever on admission, in ED)  ?Does the patient meet 2 or more of the SIRS criteria? Yes  ?Does the patient have a confirmed or suspected source of infection? Yes  ?Provider and Rapid Response Notified? Yes  ?MEWS guidelines implemented *See Row Information* Yes  ?Treat  ?MEWS Interventions Administered prn meds/treatments  ?Pain Scale 0-10  ?Pain Score 0  ?Take Vital Signs  ?Increase Vital Sign Frequency  Yellow: Q 2hr X 2 then Q 4hr X 2, if remains yellow, continue Q 4hrs;Red: Q 1hr X 4 then Q 4hr X 4, if remains red, continue Q 4hrs  ?Escalate  ?MEWS: Escalate Red: discuss with charge nurse/RN and provider, consider discussing with RRT  ?Notify: Charge Nurse/RN  ?Name of Charge Nurse/RN Notified Trenda Moots, RN  ?Date Charge Nurse/RN Notified 09/01/21  ?Time Charge Nurse/RN Notified 0330  ?Assess: SIRS CRITERIA  ?SIRS Temperature  1  ?SIRS Pulse 1  ?SIRS Respirations  1  ?SIRS WBC 1  ?SIRS Score Sum  4  ? ? ?

## 2021-09-01 NOTE — Progress Notes (Signed)
Pharmacy Antibiotic Note ? ?[name] is a 76 y.o. female admitted on 08/31/2021 with sepsis.  Pharmacy has been consulted for   vancomycin dosing. ? ?Pt is also on  acyclovir CTX and ampicillin  ? ? ? ?Plan: ?Ceftriaxone 2 gr IV q12h  ?Vancomycin 750 mg IV every 12 hours for target trough of 15-20 ?Ampicillin 2 gr IV q4h  ?Acyclovir Acyclovir '10mg'$ /kg every 8 hrs. ? - NS back to 125 ml/hr to minimize renal toxicity ? ? ?Height: '5\' 1"'$  (154.9 cm) ?Weight: 81.6 kg (180 lb) ?IBW/kg (Calculated) : 47.8 ? ?Temp (24hrs), Avg:100.1 ?F (37.8 ?C), Min:98.7 ?F (37.1 ?C), Max:102.8 ?F (39.3 ?C) ? ?Recent Labs  ?Lab 08/31/21 ?1715 08/31/21 ?1721 08/31/21 ?1812 08/31/21 ?1942 09/01/21 ?0444  ?WBC 16.6*  --   --   --  14.8*  ?CREATININE 0.81 0.70  --   --  0.70  ?LATICACIDVEN  --   --  0.8 0.7  --   ? ?  ?Estimated Creatinine Clearance: 58.8 mL/min (by C-G formula based on SCr of 0.7 mg/dL).   ? ?Allergies  ?Allergen Reactions  ? Levaquin [Levofloxacin] Nausea Only  ? Omnicef [Cefdinir] Nausea And Vomiting  ?  diarrhea  ? ? ?Antimicrobials this admission: ?3/16 Flagyl x 1 ?3/16 Cefepime >> 3/17  ?3/16 vancomycin >>  ?3/16 acyclovir >> ?3/17 ceftriaxone >>  ?3/17 ampicillin >>  ? ?Microbiology results: ?3/16 BCx: sent ?3/16 UCx: sent  ?3/16 CSF: pending ? ?Thank you for allowing pharmacy to be a part of this patient?s care. ? ?.ngs ? ? ?

## 2021-09-01 NOTE — Procedures (Signed)
Patient Name: [name]  ?MRN: [id number]  ?Epilepsy Attending: Lora Havens  ?Referring Physician/Provider: Domenic Moras, PA-C ?Date: 09/02/2019 ?Duration: 22.10 mins ? ?Patient history: 76yo F with ams. EEG to evaluate for seizure ? ?Level of alertness: Awake ? ?AEDs during EEG study: None ? ?Technical aspects: This EEG study was done with scalp electrodes positioned according to the 10-20 International system of electrode placement. Electrical activity was acquired at a sampling rate of '500Hz'$  and reviewed with a high frequency filter of '70Hz'$  and a low frequency filter of '1Hz'$ . EEG data were recorded continuously and digitally stored.  ? ?Description: No clear posterior dominant rhythm was seen. EEG showed continuous generalized polymorphic 6-'9hz'$  Hz theta-alpha activity admixed with intermittent generalized 2-'3Hz'$  delta slowing, at times with triphasic morphology. Hyperventilation and photic stimulation were not performed.    ? ?ABNORMALITY ?- Continuous slow, generalized ? ?IMPRESSION: ?This study is suggestive of moderate diffuse encephalopathy, nonspecific etiology. No seizures or epileptiform discharges were seen throughout the recording. ? ?Lora Havens  ? ?

## 2021-09-01 NOTE — Progress Notes (Signed)
?PROGRESS NOTE ? ? ?[name]  INO:[id number] DOB: 11-03-1945 DOA: 08/31/2021 ?PCP: Plotnikov, Evie Lacks, MD  ?Brief Narrative:  ? 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 Henrene Pastor for severe constipation ?HTN ?migraine ?B12 deficiency prior diverticulosis prior knee surgeries ?She has sustained prior falls in January 2023 ? ?Presented to [facility] ED 08/31/2021 with confusion inability to talk ?ED work-up showed no abnormality on head CT, WBC was 16 ?Lumbar puncture performed in the ED showed rare gram-positive cocci no WBC glucose 65 total protein 27 ? ?Both neurology and ID were consulted secondary to concerns for either stroke versus cryptogenic meningitis (patient had sinusitis in January 2023 and did not complete Rx) ? ?Hospital-Problem based course ? ??  Meningitis ?CSF shows only few cells with no white cells-if anything this may be aseptic meningitis/viral meningitis but for now until ID sees the patient will continue all meds including acyclovir with fluids NS 125 cc/H ?Elevated blood pressure sinus tachycardia ?She is on Cardizem 240 at home which has not been resumed yet-we will start these back and this should offer better control ?Toxic metabolic encephalopathy ?Etiology likely to be infectious related however rare causes possible-if no source found on blood culture/CSF culture would broaden search and obtain B12-TSH was normal, ammonia was normal  ?CT, MR brain showed no stroke although age-related changes ?EEG is pending and I appreciate Dr. 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  Henrene Pastor ?Mild hypokalemia ?Continue K. Dur 40 daily ?Recent multiple falls in the outpatient setting in January ?She is still too lethargic to get up but I will place PT orders expectantly for a.m. ? ?DVT prophylaxis: Lovenox ?Code Status: Full ?Family Communication: Discussed with daughter at the bedside ?Disposition:  ?Status is: Inpatient ?Remains inpatient appropriate because: Still confused ?  ?Consultants:  ?Infectious disease ?Neurology ? ?Procedures:  ? ?Antimicrobials: Multiple ? ? ?Subjective: ?Somewhat lethargic and tries to speak but closes eyes ?No cp no fever ? ? ?Objective: ?Vitals:  ? 09/01/21 0337 09/01/21 0530 09/01/21 0703 09/01/21 1334  ?BP: (!) 146/52 (!) 135/56 (!) 124/58 (!) 156/75  ?Pulse: (!) 111 (!) 102 100 (!) 56  ?Resp: (!) 21 (!) 23  18  ?Temp: (!) 101.3 ?F (38.5 ?C) (!) 101 ?F (38.3 ?C) 100 ?F (37.8 ?C) 99.4 ?F (37.4 ?C)  ?TempSrc: Oral Oral Oral Oral  ?SpO2: 92% 92% 93% 100%  ?Weight:      ?Height:      ? ? ?Intake/Output Summary (Last 24 hours) at 09/01/2021 1547 ?Last data filed at 09/01/2021 0429 ?Gross per 24 hour  ?Intake 1450 ml  ?Output 400 ml  ?Net 1050 ml  ? ?Filed Weights  ? 08/31/21 1711 08/31/21 1953  ?Weight: 79.8 kg 81.6 kg  ? ? ?Examination: ? ?Eomi ncat pupils ~ 4 mm ?No ict no pallor--responsive but quite sleepy ?Cta b no added sound ?Abd soft nt nd ?No flap--some diffculty comprehending commabnd for dorsiflexion ?No clonus ?Power 5/5 to upper and lower ext ?Reflexes equivocal ?Sensory and coordination cannot be assesed well ? ? ? ?Data Reviewed: personally reviewed  ? ?CBC ?   ?Component Value Date/Time  ? WBC 14.8 (H) 09/01/2021  0444  ? RBC 3.79 (L) 09/01/2021 0444  ? HGB 11.3 (L) 09/01/2021 0444  ? HCT 34.7 (L) 09/01/2021 0444  ? PLT 183 09/01/2021 0444  ? MCV 91.6 09/01/2021 0444  ? MCH 29.8 09/01/2021 0444  ? MCHC 32.6 09/01/2021 0444  ? RDW 14.4 09/01/2021 0444  ? LYMPHSABS 1.1 09/01/2021 0444  ? MONOABS 0.8 09/01/2021 0444  ? EOSABS 0.0 09/01/2021 0444  ? BASOSABS  0.1 09/01/2021 0444  ? ?CMP Latest Ref Rng & Units 09/01/2021 08/31/2021 08/31/2021  ?Glucose 70 - 99 mg/dL 112(H) - 107(H)  ?BUN 8 - 23 mg/dL 11 - 11  ?Creatinine 0.44 - 1.00 mg/dL 0.70 - 0.70  ?Sodium 135 - 145 mmol/L 137 - 138  ?Potassium 3.5 - 5.1 mmol/L 3.2(L) - 3.6  ?Chloride 98 - 111 mmol/L 107 - 102  ?CO2 22 - 32 mmol/L 23 - -  ?Calcium 8.9 - 10.3 mg/dL 8.0(L) - -  ?Total Protein 6.5 - 8.1 g/dL 5.6(L) 5.7(L) -  ?Total Bilirubin 0.3 - 1.2 mg/dL 0.7 0.7 -  ?Alkaline Phos 38 - 126 U/L 64 59 -  ?AST 15 - 41 U/L 19 11(L) -  ?ALT 0 - 44 U/L 22 9 -  ? ? ? ?Radiology Studies: ?MR Brain W and Wo Contrast ? ?Result Date: 09/01/2021 ?CLINICAL DATA:  76 year old female code stroke presentation yesterday. Abnormal speech. EXAM: MRI HEAD WITHOUT AND WITH CONTRAST TECHNIQUE: Multiplanar, multiecho pulse sequences of the brain and surrounding structures were obtained without and with intravenous contrast. CONTRAST:  19m GADAVIST GADOBUTROL 1 MMOL/ML IV SOLN COMPARISON:  Head CT 08/31/2021.  Brain MRI 05/06/2018. FINDINGS: Study is mildly degraded by motion artifact despite repeated imaging attempts. Brain: No restricted diffusion to suggest acute infarction. No midline shift, mass effect, evidence of mass lesion, ventriculomegaly, extra-axial collection or acute intracranial hemorrhage. Cervicomedullary junction and pituitary are within normal limits. Progressed since 2019 and now moderately advanced patchy T2 and FLAIR hyperintensity scattered in the bilateral cerebral white matter, especially the corona radiata, and involving some of the anterior deep white matter capsules on the left. Chronic T2 heterogeneity in the left caudate. No cortical encephalomalacia or chronic cerebral blood products identified. Possible chronic lacunar infarct dorsal left pons. No abnormal enhancement identified.  No dural thickening. Vascular: Major intracranial vascular flow voids are grossly stable. Skull and upper cervical spine: Grossly stable  visible cervical spine. Visualized bone marrow signal is within normal limits. Sinuses/Orbits: Stable allowing for motion. Other: Mild left greater than right mastoid effusions are new or increased since 2019. And there is small volume retained secretions in the nasopharynx. IMPRESSION: 1. Mildly motion degraded exam with no acute intracranial abnormality identified. 2. 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 Electronically Signed   By: HGenevie AnnM.D.   On: 09/01/2021 11:41  ? ?CT ABDOMEN PELVIS W CONTRAST ? ?Result Date: 08/31/2021 ?CLINICAL DATA:  Abdominal pain. EXAM: CT ABDOMEN AND PELVIS WITH CONTRAST TECHNIQUE: Multidetector CT imaging of the abdomen and pelvis was performed using the standard protocol following bolus administration of intravenous contrast. RADIATION DOSE REDUCTION: This exam was performed according to the departmental dose-optimization program which includes automated exposure control, adjustment of the mA and/or kV according to patient size and/or use of iterative reconstruction technique. CONTRAST:  1024mOMNIPAQUE IOHEXOL 300 MG/ML  SOLN COMPARISON:  CT abdomen pelvis dated 02/07/2017. FINDINGS: Lower chest: The visualized lung bases are clear. There is coronary  vascular calcification. No intra-abdominal free air or free fluid. Hepatobiliary: Multiple small liver cysts similar to prior CT. No intrahepatic biliary dilatation. Cholecystectomy. No retained calcified stone noted in the central CBD. Pancreas: Unremarkable. No pancreatic ductal dilatation or surrounding inflammatory changes. Spleen: Normal in size without focal abnormality. Adrenals/Urinary Tract: The adrenal glands unremarkable the kidneys, visualized ureters, and urinary bladder appear unremarkable. Stomach/Bowel: Postsurgical changes of the stomach. There is moderate stool throughout the colon. No bowel obstruction or active  inflammation. The appendix is not visualized with certainty. No inflammatory changes identified in the right lower quadrant. Vascular/Lymphatic: Advanced aortoiliac atherosclerotic disease. The IVC is unre

## 2021-09-01 NOTE — Progress Notes (Signed)
EEG complete - results pending 

## 2021-09-02 DIAGNOSIS — G9341 Metabolic encephalopathy: Secondary | ICD-10-CM | POA: Diagnosis not present

## 2021-09-02 DIAGNOSIS — R6510 Systemic inflammatory response syndrome (SIRS) of non-infectious origin without acute organ dysfunction: Secondary | ICD-10-CM | POA: Diagnosis not present

## 2021-09-02 DIAGNOSIS — G030 Nonpyogenic meningitis: Secondary | ICD-10-CM

## 2021-09-02 DIAGNOSIS — R410 Disorientation, unspecified: Secondary | ICD-10-CM | POA: Diagnosis not present

## 2021-09-02 DIAGNOSIS — G039 Meningitis, unspecified: Secondary | ICD-10-CM | POA: Diagnosis not present

## 2021-09-02 DIAGNOSIS — R509 Fever, unspecified: Secondary | ICD-10-CM | POA: Diagnosis not present

## 2021-09-02 DIAGNOSIS — G9340 Encephalopathy, unspecified: Secondary | ICD-10-CM

## 2021-09-02 LAB — CBC WITH DIFFERENTIAL/PLATELET
Abs Immature Granulocytes: 0.11 10*3/uL — ABNORMAL HIGH (ref 0.00–0.07)
Basophils Absolute: 0.1 10*3/uL (ref 0.0–0.1)
Basophils Relative: 0 %
Eosinophils Absolute: 0 10*3/uL (ref 0.0–0.5)
Eosinophils Relative: 0 %
HCT: 36.1 % (ref 36.0–46.0)
Hemoglobin: 11.4 g/dL — ABNORMAL LOW (ref 12.0–15.0)
Immature Granulocytes: 1 %
Lymphocytes Relative: 10 %
Lymphs Abs: 1.8 10*3/uL (ref 0.7–4.0)
MCH: 29.8 pg (ref 26.0–34.0)
MCHC: 31.6 g/dL (ref 30.0–36.0)
MCV: 94.5 fL (ref 80.0–100.0)
Monocytes Absolute: 1.3 10*3/uL — ABNORMAL HIGH (ref 0.1–1.0)
Monocytes Relative: 7 %
Neutro Abs: 14.5 10*3/uL — ABNORMAL HIGH (ref 1.7–7.7)
Neutrophils Relative %: 82 %
Platelets: 161 10*3/uL (ref 150–400)
RBC: 3.82 MIL/uL — ABNORMAL LOW (ref 3.87–5.11)
RDW: 14.6 % (ref 11.5–15.5)
WBC: 17.8 10*3/uL — ABNORMAL HIGH (ref 4.0–10.5)
nRBC: 0 % (ref 0.0–0.2)

## 2021-09-02 LAB — URINE CULTURE: Culture: NO GROWTH

## 2021-09-02 LAB — BASIC METABOLIC PANEL
Anion gap: 9 (ref 5–15)
BUN: 9 mg/dL (ref 8–23)
CO2: 21 mmol/L — ABNORMAL LOW (ref 22–32)
Calcium: 7.8 mg/dL — ABNORMAL LOW (ref 8.9–10.3)
Chloride: 106 mmol/L (ref 98–111)
Creatinine, Ser: 0.66 mg/dL (ref 0.44–1.00)
GFR, Estimated: >60 mL/min (ref >60)
Glucose, Bld: 96 mg/dL (ref 70–99)
Potassium: 2.9 mmol/L — ABNORMAL LOW (ref 3.5–5.1)
Sodium: 136 mmol/L (ref 135–145)

## 2021-09-02 LAB — MAGNESIUM: Magnesium: 2 mg/dL (ref 1.7–2.4)

## 2021-09-02 LAB — HEMOGLOBIN A1C
Hgb A1c MFr Bld: 5.6 % (ref 4.8–5.6)
Mean Plasma Glucose: 114 mg/dL

## 2021-09-02 MED ORDER — VALACYCLOVIR HCL 500 MG PO TABS
1000.0000 mg | ORAL_TABLET | Freq: Three times a day (TID) | ORAL | Status: DC
  Administered 2021-09-02 – 2021-09-05 (×8): 1000 mg via ORAL
  Filled 2021-09-02 (×8): qty 2

## 2021-09-02 MED ORDER — POTASSIUM CHLORIDE 2 MEQ/ML IV SOLN
INTRAVENOUS | Status: DC
  Filled 2021-09-02 (×4): qty 1000

## 2021-09-02 MED ORDER — POTASSIUM CHLORIDE CRYS ER 20 MEQ PO TBCR
40.0000 meq | EXTENDED_RELEASE_TABLET | Freq: Every day | ORAL | Status: DC
  Administered 2021-09-02 – 2021-09-03 (×2): 40 meq via ORAL
  Filled 2021-09-02 (×3): qty 2

## 2021-09-02 NOTE — Progress Notes (Signed)
Neurology Progress Note ? ?Brief HPI: 76 y.o. female with PMHx of HTN, HLD, CAD, B12 deficiency, and depression who presented to the ED 3/16 for evaluation of confusion, disorientation, and delay of speech (family reports inability of patient to complete sentences). Initial work up included negative CTH, creatinine of 0.70, leukocytosis with a WBC of 16.6, UA without evidence of infection, febrile with a TMAX of 102.8 degrees Farenheit, CXR negative, initial CSF without concern for infectious process. ? ?Subjective: ?Patient with significant improvement in mental status on examination this morning. Endorses feeling at her baseline.  ?MRI brain w+wo contrast without acute intracranial abnormality.  ?EEG 3/17 suggestive of moderate diffuse encephalopathy without seizures or epileptiform discharges.  ? ?Exam: ?Vitals:  ? 09/02/21 0423 09/02/21 0918  ?BP: (!) 140/53 (!) 124/46  ?Pulse: (!) 52 93  ?Resp: 20   ?Temp: 98.2 ?F (36.8 ?C) 97.7 ?F (36.5 ?C)  ?SpO2: 97% 96%  ? ?Gen: Sitting up comfortably in bed, in no acute distress.  ?Resp: non-labored breathing, no respiratory distress ?Abd: soft, non-tender, non-distended ? ?Neuro: ?Mental Status: Awake, alert, oriented throughout.  ?Follows commands without difficulty.  ?Speech is fluent without evidence of aphasia or neglect.  ?Cranial Nerves: PERRL, EOMI without ptosis, gaze preference, or nystagmus, facial sensation is intact and symmetric to light touch, face is symmetric resting and smiling, hearing is intact to voice, tongue is midline.  ?Motor: 5/5 strength throughout without asymmetry or noted weakness.  ?Tone and bulk are normal.  ?Sensory: Intact and symmetric to light touch throughout ?Gait: Deferred ? ?Pertinent Labs: ?CBC ?   ?Component Value Date/Time  ? WBC 17.8 (H) 09/02/2021 0743  ? RBC 3.82 (L) 09/02/2021 0743  ? HGB 11.4 (L) 09/02/2021 0743  ? HCT 36.1 09/02/2021 0743  ? PLT 161 09/02/2021 0743  ? MCV 94.5 09/02/2021 0743  ? MCH 29.8 09/02/2021 0743  ?  MCHC 31.6 09/02/2021 0743  ? RDW 14.6 09/02/2021 0743  ? LYMPHSABS 1.8 09/02/2021 0743  ? MONOABS 1.3 (H) 09/02/2021 0743  ? EOSABS 0.0 09/02/2021 0743  ? BASOSABS 0.1 09/02/2021 0743  ? ?CMP  ?   ?Component Value Date/Time  ? NA 136 09/02/2021 0419  ? K 2.9 (L) 09/02/2021 0419  ? CL 106 09/02/2021 0419  ? CO2 21 (L) 09/02/2021 0419  ? GLUCOSE 96 09/02/2021 0419  ? BUN 9 09/02/2021 0419  ? CREATININE 0.66 09/02/2021 0419  ? CALCIUM 7.8 (L) 09/02/2021 0419  ? PROT 5.6 (L) 09/01/2021 0444  ? ALBUMIN 3.0 (L) 09/01/2021 0444  ? AST 19 09/01/2021 0444  ? ALT 22 09/01/2021 0444  ? ALKPHOS 64 09/01/2021 0444  ? BILITOT 0.7 09/01/2021 0444  ? GFRNONAA >60 09/02/2021 0419  ? GFRAA >60 02/16/2017 1046  ? ? Latest Reference Range & Units 08/31/21 20:50 08/31/21 20:51  ?Appearance, CSF CLEAR  ?CLEAR  CLEAR ! ?CLEAR !   ?Glucose, CSF 40 - 70 mg/dL 65 66  ?RBC Count, CSF 0 /cu mm ?0 /cu mm 20 (H) ?820 (H) (C)   ?WBC, CSF 0 - 5 /cu mm ?0 - 5 /cu mm 0 ?0   ?Other Cells, CSF  TOO FEW TO COUNT, SMEAR AVAILABLE FOR REVIEW ?TOO FEW TO COUNT, SMEAR AVAILABLE FOR REVIEW   ?Color, CSF COLORLESS  ?COLORLESS  COLORLESS ?PINK !   ?Supernatant  CLEAR ?CLEAR   ?Total  Protein, CSF 15 - 45 mg/dL 27 26  ?Tube #  4 ?1   ? ? Latest Reference Range & Units 08/31/21  19:42  ?Alcohol, Ethyl (B) <10 mg/dL <10  ? ? Latest Reference Range & Units 08/31/21 22:23  ?CK Total 38 - 234 U/L 33 (L)  ? ?Imaging Reviewed: ? ?MRI brain wwo contrast 3/17: ?1. Mildly motion degraded exam with no acute intracranial abnormality identified. ?2. 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 ? ?Routine EEG 3/17: ?"This study is suggestive of moderate diffuse encephalopathy, nonspecific etiology. No seizures or epileptiform discharges were seen throughout the recording." ? ?Assessment: 76 year old female with history as above who presented to ED for confusion, amnesia,  disorientation. CT head no acute abnormality. WBC 16.6, febrile the night of arrival. One month ago patient had a PCP visit for headache and cough with congestion, concerning for sinusitis, put on Cefdinir but patient stopped the medication by herself. WBC 16.6 > 14.8 > 17.8.  ?  ?Patient not a tPA candidate given no focal deficit, examination more consistent with encephalopathy and barely OSW. Patient symptoms concerning for encephalopathy in the setting of signs and symptoms of an infection of an unclear source. She did have improvement on exam 3/17 with some confusion that afternoon on attending neurologist's examination. Repeat exam 3/18 with significant improvement in mental status, currently at baseline mental status. I think that her confusion represents delirium in the setting of her underlying inflammatory response, source of infection is currently unclear. ? ?CSF is extremely unremarkable, very low suspicion for CNS infection, but there is a gram-positive cocci report on the Gram stain. Strongly suspect this is contaminant. HSV can rarely cause infection without pleocytosis, but I think this is also unlikely though it would not be completely unreasonable if she has continued confusion to await HSV PCR.  Appreciate ID assistance. ? ?Recommendations: ?- Continue to evaluate and treat for metabolic / infectious derangements per primary team / ID as you are ?- Patient remains on empiric abx with improvement in mental status and some improvement in leukocytosis overnight. ?- No further neurology recommendations at this time, please contact neurology on-call for further questions or concerns.  ? ?- Plan discussed with attending neurologist, Dr. Leonel Ramsay, who is in agreement.  ? ?Anibal Henderson, AGACNP-BC ?[facility] ?[phone] ?

## 2021-09-02 NOTE — Evaluation (Addendum)
Physical Therapy Evaluation ?Patient Details ?Name: [name] ?MRN: [id number] ?DOB: [birth year] ?Today's Date: 09/02/2021 ? ?History of Present Illness ? 76 y.o. female with PMHx of HTN, HLD, CAD, B12 deficiency, and depression who presented to the ED 3/16 for evaluation of confusion, disorientation, and delay of speech (family reports inability of patient to complete sentences). Initial work up included negative, without evidence of infection,  CXR negative, initial CSF without concern for infectious process.MRI brain w+wo contrast without acute intracranial abnormality.   EEG 3/17 suggestive of moderate diffuse encephalopathy without seizures or epileptiform discharges  ?Clinical Impression ? Pt admitted with above diagnosis.  ?Pt reports feeling much better today. Ox3, dtr present. Pt able to amb in room distance with min assist. SpO2= 96-98% on RA. ?Anticipate she will progress well now that mental status has cleared. ? Pt currently with functional limitations due to the deficits listed below (see PT Problem List). Pt will benefit from skilled PT to increase their independence and safety with mobility to allow discharge to the venue listed below.   ?   ?   ? ?Recommendations for follow up therapy are one component of a multi-disciplinary discharge planning process, led by the attending physician.  Recommendations may be updated based on patient status, additional functional criteria and insurance authorization. ? ?Follow Up Recommendations Home health PT ? ?  ?Assistance Recommended at Discharge    ?Patient can return home with the following ? Help with stairs or ramp for entrance;Assistance with cooking/housework;Assist for transportation ? ?  ?Equipment Recommendations Other (comment) (TBD)  ?Recommendations for Other Services ?    ?  ?Functional Status Assessment Patient has had a recent decline in their functional status and demonstrates the ability to make significant improvements in function in a  reasonable and predictable amount of time.  ? ?  ?Precautions / Restrictions Precautions ?Precautions: Fall ?Restrictions ?Weight Bearing Restrictions: No  ? ?  ? ?Mobility ? Bed Mobility ?Overal bed mobility: Needs Assistance ?Bed Mobility: Supine to Sit ?  ?  ?Supine to sit: Min guard, Min assist ?  ?  ?General bed mobility comments: for safety to elevate trunk and manage lines ?  ? ?Transfers ?Overall transfer level: Needs assistance ?Equipment used: 1 person hand held assist, 2 person hand held assist ?Transfers: Sit to/from Stand ?Sit to Stand: Min assist ?  ?  ?  ?  ?  ?General transfer comment: assist to rise and steady from bed and toilet, wide BOS upon initial standing ?  ? ?Ambulation/Gait ?Ambulation/Gait assistance: Min assist ?Gait Distance (Feet): 15 Feet (20' more) ?Assistive device: 1 person hand held assist, 2 person hand held assist ?Gait Pattern/deviations: Step-through pattern, Decreased stride length, Trunk flexed, Wide base of support ?  ?  ?  ?General Gait Details: unsteady gait with attempts to furniture walk without HHA. min assist for balance and safety. RW not utilized as pt with bathroom urgency/needs on PT arrival ? ?Stairs ?  ?  ?  ?  ?  ? ?Wheelchair Mobility ?  ? ?Modified Rankin (Stroke Patients Only) ?  ? ?  ? ?Balance Overall balance assessment: Needs assistance ?Sitting-balance support: Feet supported, No upper extremity supported ?Sitting balance-Leahy Scale: Good ?  ?  ?Standing balance support: Reliant on assistive device for balance, During functional activity ?Standing balance-Leahy Scale: Fair ?Standing balance comment: reliant on UE support for dynamic tasks ?  ?  ?  ?  ?  ?  ?  ?  ?  ?  ?  ?   ? ? ? ?  Pertinent Vitals/Pain Pain Assessment ?Pain Assessment: No/denies pain  ? ? ?Home Living Family/patient expects to be discharged to:: Private residence ?Living Arrangements: Alone ?Available Help at Discharge: Family;Available 24 hours/day ?Type of Home: House ?Home Access:  Stairs to enter ?  ?Entrance Stairs-Number of Steps: 3 ?  ?Home Layout: One level ?Home Equipment: None ?Additional Comments: dtr reports she and her dtr are  able to assist as needed.  ?  ?Prior Function Prior Level of Function : Independent/Modified Independent ?  ?  ?  ?  ?  ?  ?  ?  ?  ? ? ?Hand Dominance  ?   ? ?  ?Extremity/Trunk Assessment  ? Upper Extremity Assessment ?Upper Extremity Assessment: Overall WFL for tasks assessed ?  ? ?Lower Extremity Assessment ?Lower Extremity Assessment: Overall WFL for tasks assessed ?  ? ?   ?Communication  ? Communication: No difficulties  ?Cognition Arousal/Alertness: Awake/alert ?Behavior During Therapy: [facility] for tasks assessed/performed ?Overall Cognitive Status: Within Functional Limits for tasks assessed ?  ?  ?  ?  ?  ?  ?  ?  ?  ?  ?  ?  ?  ?  ?  ?  ?General Comments: pt is alert, oriented, following commands, appropriate throughout PT eval; dtr present and reports pt cognition seems to be  returning to  baseline ?  ?  ? ?  ?General Comments   ? ?  ?Exercises    ? ?Assessment/Plan  ?  ?PT Assessment Patient needs continued PT services  ?PT Problem List Decreased mobility;Decreased activity tolerance;Decreased balance;Decreased knowledge of use of DME ? ?   ?  ?PT Treatment Interventions DME instruction;Therapeutic exercise;Gait training;Functional mobility training;Therapeutic activities;Patient/family education;Balance training   ? ?PT Goals (Current goals can be found in the Care Plan section)  ?Acute Rehab PT Goals ?Patient Stated Goal: home soon ?PT Goal Formulation: With patient ?Time For Goal Achievement: 09/22/21 ?Potential to Achieve Goals: Good ? ?  ?Frequency Min 3X/week ?  ? ? ?Co-evaluation   ?  ?  ?  ?  ? ? ?  ?AM-PAC PT "6 Clicks" Mobility  ?Outcome Measure Help needed turning from your back to your side while in a flat bed without using bedrails?: A Little ?Help needed moving from lying on your back to sitting on the side of a flat bed without using  bedrails?: A Little ?Help needed moving to and from a bed to a chair (including a wheelchair)?: A Little ?Help needed standing up from a chair using your arms (e.g., wheelchair or bedside chair)?: A Little ?Help needed to walk in hospital room?: A Little ?Help needed climbing 3-5 steps with a railing? : A Lot ?6 Click Score: 17 ? ?  ?End of Session   ?Activity Tolerance: Patient tolerated treatment well ?Patient left: in chair;with call bell/phone within reach;with chair alarm set;with family/visitor present ?Nurse Communication: Mobility status ?PT Visit Diagnosis: Unsteadiness on feet (R26.81);Other abnormalities of gait and mobility (R26.89) ?  ? ?Time: 4098-1191 ?PT Time Calculation (min) (ACUTE ONLY): 12 min ? ? ?Charges:   PT Evaluation ?$PT Eval Low Complexity: 1 Low ?  ?  ?   ? ? ?Delice Bison, PT ? ?Acute Rehab Dept [facility]) [phone] ?Pager [phone] ? ?09/02/2021 ? ? ?Noami Bove ?09/02/2021, 1:10 PM ? ?

## 2021-09-02 NOTE — Progress Notes (Signed)
?   ? ? ? ? ?[facility] ? ?Date of Admission:  08/31/2021    ? ? ?Abx: ?Acyclovir ?Vanc currently off as of now ?Ceftriaxone ?ampicillin ? ?ASSESSMENT: ?Sepsis ?Encephalopathy ? ?Csf no leukocytosis, normal protein/glucose, but with gpc on stain. Can't r/o contaminant.  ?Fever resolved on empiric abx ?Blood cx and csf cx ngtd ? ?Mentation improving. Feeling at baseline ?Mri brain without acute intracranial abnormality ?Eeg no seizure ? ? ?PLAN: ?Keep current IV antibiotics. Ok without vancomycin further ?Ok if primary team want to transition acyclovir to oral high dose valtrex 1 gram tid ?Await csf culture and pcr to help deescalate ?Discussed with primary team ? ?Principal Problem: ?  SIRS (systemic inflammatory response syndrome) (HCC) ?Active Problems: ?  Dyslipidemia ?  Morbid obesity ([place]) ?  Essential hypertension ?  Sinusitis, chronic ?  Constipation ?  Severe sepsis ([place]) ?  Acute metabolic encephalopathy ?  Abnormal microbiological findings in CSF ?  Frequent PVCs ?  Fever of unknown origin (FUO) ?  Delirium ? ? ?Allergies  ?Allergen Reactions  ? Levaquin [Levofloxacin] Nausea Only  ? Omnicef [Cefdinir] Nausea And Vomiting  ?  diarrhea  ? ? ?Scheduled Meds: ? darifenacin  7.5 mg Oral Daily  ? feeding supplement  237 mL Oral BID BM  ? fluticasone  2 spray Each Nare Daily  ? multivitamin with minerals  1 tablet Oral Daily  ? pantoprazole  80 mg Oral Daily  ? polyethylene glycol  17 g Oral Daily  ? verapamil  240 mg Oral QHS  ? vitamin B-12  500 mcg Oral Daily  ? ?Continuous Infusions: ? acyclovir 615 mg (09/02/21 1520)  ? ampicillin (OMNIPEN) IV 2 g (09/02/21 1200)  ? cefTRIAXone (ROCEPHIN)  IV 2 g (09/02/21 0424)  ? dextrose 5% lactated ringers 1,000 mL with potassium chloride 40 mEq infusion 125 mL/hr at 09/02/21 0945  ? ?PRN Meds:.acetaminophen **OR** acetaminophen, benzonatate, ibuprofen, LORazepam ? ? ?SUBJECTIVE: ?Fever resolved ?Feeling better ?Mentating better ?Cx and pcr  in process ? ?Review of Systems: ?ROS ?All other ROS was negative, except mentioned above ? ? ? ? ?OBJECTIVE: ?Vitals:  ? 09/01/21 2133 09/02/21 0423 09/02/21 0918 09/02/21 1230  ?BP: (!) 153/53 (!) 140/53 (!) 124/46 136/64  ?Pulse: (!) 55 (!) 52 93 93  ?Resp:  20  18  ?Temp: 99.9 ?F (37.7 ?C) 98.2 ?F (36.8 ?C) 97.7 ?F (36.5 ?C) 97.6 ?F (36.4 ?C)  ?TempSrc: Oral Oral Oral Oral  ?SpO2: 99% 97% 96% 100%  ?Weight:      ?Height:      ? ?Body mass index is 34.01 kg/m?. ? ?Physical Exam ?General/constitutional: no distress, pleasant ?HEENT: Normocephalic, PER, Conj Clear, EOMI, Oropharynx clear ?Neck supple ?CV: rrr no mrg ?Lungs: clear to auscultation, normal respiratory effort ?Abd: Soft, Nontender ?Ext: no edema ?Skin: No Rash ?Neuro: nonfocal ? ? ?Lab Results ?Lab Results  ?Component Value Date  ? WBC 17.8 (H) 09/02/2021  ? HGB 11.4 (L) 09/02/2021  ? HCT 36.1 09/02/2021  ? MCV 94.5 09/02/2021  ? PLT 161 09/02/2021  ?  ?Lab Results  ?Component Value Date  ? CREATININE 0.66 09/02/2021  ? BUN 9 09/02/2021  ? NA 136 09/02/2021  ? K 2.9 (L) 09/02/2021  ? CL 106 09/02/2021  ? CO2 21 (L) 09/02/2021  ?  ?Lab Results  ?Component Value Date  ? ALT 22 09/01/2021  ? AST 19 09/01/2021  ? ALKPHOS 64 09/01/2021  ? BILITOT 0.7 09/01/2021  ?  ? ? ?  Microbiology: ?Recent Results (from the past 240 hour(s))  ?Resp Panel by RT-PCR (Flu A&B, Covid) Nasopharyngeal Swab     Status: None  ? Collection Time: 08/31/21  5:15 PM  ? Specimen: Nasopharyngeal Swab; Nasopharyngeal(NP) swabs in vial transport medium  ?Result Value Ref Range Status  ? SARS Coronavirus 2 by RT PCR NEGATIVE NEGATIVE Final  ?  Comment: (NOTE) ?SARS-CoV-2 target nucleic acids are NOT DETECTED. ? ?The SARS-CoV-2 RNA is generally detectable in upper respiratory ?specimens during the acute phase of infection. The lowest ?concentration of SARS-CoV-2 viral copies this assay can detect is ?138 copies/mL. A negative result does not preclude SARS-Cov-2 ?infection and should not be  used as the sole basis for treatment or ?other patient management decisions. A negative result may occur with  ?improper specimen collection/handling, submission of specimen other ?than nasopharyngeal swab, presence of viral mutation(s) within the ?areas targeted by this assay, and inadequate number of viral ?copies(<138 copies/mL). A negative result must be combined with ?clinical observations, patient history, and epidemiological ?information. The expected result is Negative. ? ?Fact Sheet for Patients:  ?EntrepreneurPulse.com.au ? ?Fact Sheet for Healthcare Providers:  ?IncredibleEmployment.be ? ?This test is no t yet approved or cleared by the [place] FDA and  ?has been authorized for detection and/or diagnosis of SARS-CoV-2 by ?FDA under an Emergency Use Authorization (EUA). This EUA will remain  ?in effect (meaning this test can be used) for the duration of the ?COVID-19 declaration under Section 564(b)(1) of the Act, 21 ?U.S.C.section 360bbb-3(b)(1), unless the authorization is terminated  ?or revoked sooner.  ? ? ?  ? Influenza A by PCR NEGATIVE NEGATIVE Final  ? Influenza B by PCR NEGATIVE NEGATIVE Final  ?  Comment: (NOTE) ?The Xpert Xpress SARS-CoV-2/FLU/RSV plus assay is intended as an aid ?in the diagnosis of influenza from Nasopharyngeal swab specimens and ?should not be used as a sole basis for treatment. Nasal washings and ?aspirates are unacceptable for Xpert Xpress SARS-CoV-2/FLU/RSV ?testing. ? ?Fact Sheet for Patients: ?EntrepreneurPulse.com.au ? ?Fact Sheet for Healthcare Providers: ?IncredibleEmployment.be ? ?This test is not yet approved or cleared by the [place] FDA and ?has been authorized for detection and/or diagnosis of SARS-CoV-2 by ?FDA under an Emergency Use Authorization (EUA). This EUA will remain ?in effect (meaning this test can be used) for the duration of the ?COVID-19 declaration under Section  564(b)(1) of the Act, 21 U.S.C. ?section 360bbb-3(b)(1), unless the authorization is terminated or ?revoked. ? ?Performed at [facility], [place] [address] ?[place], [place] [address] ?  ?Respiratory (~20 pathogens) panel by PCR     Status: None  ? Collection Time: 08/31/21  5:18 PM  ? Specimen: Nasopharyngeal Swab; Respiratory  ?Result Value Ref Range Status  ? Adenovirus NOT DETECTED NOT DETECTED Final  ? Coronavirus 229E NOT DETECTED NOT DETECTED Final  ?  Comment: (NOTE) ?The Coronavirus on the Respiratory Panel, DOES NOT test for the novel  ?Coronavirus (2019 nCoV) ?  ? Coronavirus HKU1 NOT DETECTED NOT DETECTED Final  ? Coronavirus NL63 NOT DETECTED NOT DETECTED Final  ? Coronavirus OC43 NOT DETECTED NOT DETECTED Final  ? Metapneumovirus NOT DETECTED NOT DETECTED Final  ? Rhinovirus / Enterovirus NOT DETECTED NOT DETECTED Final  ? Influenza A NOT DETECTED NOT DETECTED Final  ? Influenza B NOT DETECTED NOT DETECTED Final  ? Parainfluenza Virus 1 NOT DETECTED NOT DETECTED Final  ? Parainfluenza Virus 2 NOT DETECTED NOT DETECTED Final  ? Parainfluenza Virus 3 NOT DETECTED NOT DETECTED Final  ?  Parainfluenza Virus 4 NOT DETECTED NOT DETECTED Final  ? Respiratory Syncytial Virus NOT DETECTED NOT DETECTED Final  ? Bordetella pertussis NOT DETECTED NOT DETECTED Final  ? Bordetella Parapertussis NOT DETECTED NOT DETECTED Final  ? Chlamydophila pneumoniae NOT DETECTED NOT DETECTED Final  ? Mycoplasma pneumoniae NOT DETECTED NOT DETECTED Final  ?  Comment: Performed at [facility], [place] [address], [place], [place] [address]  ?Urine Culture     Status: None  ? Collection Time: 08/31/21  6:01 PM  ? Specimen: Urine, Catheterized  ?Result Value Ref Range Status  ? Specimen Description   Final  ?  URINE, CATHETERIZED ?Performed at [facility], [place] [address], [place], [place] [address] ?  ? Special Requests   Final  ?  NONE ?Performed at [facility], [place]  [address] [place], [place] [address] ?  ? Culture   Final  ?  NO GROWTH ?Performed at [facility], [place] [address] [place], [place] [address] ?  ? Report Status 09/02/2021 FINAL  Final  ?Culture, blood

## 2021-09-02 NOTE — Progress Notes (Signed)
Handoff received from [employer], Therapist, sports. Patient is resting at this time. ?

## 2021-09-02 NOTE — Progress Notes (Addendum)
?PROGRESS NOTE ? ? ?[name]  JGO:[id number] DOB: 06-16-1946 DOA: 08/31/2021 ?PCP: Plotnikov, Evie Lacks, MD  ?Brief Narrative:  ? ?[address] white female community dwelling (lives alone) ?Prior SBO's 2/2 jejunojejunostomy intussusception back in 2018--patient underwent ex lap with LOA 01/30/2017 ?Barrett's esophagus and sees Dr. Henrene Pastor for severe constipation ?HTN ?migraine ?B12 deficiency prior diverticulosis prior knee surgeries ?She has sustained prior falls in January 2023 ? ?Antecedent new h/o takes care of great grandchildren--5 y old had mild URI sympt--patient herself had sore throat cough ~ 4 days prior  to 3/26 ? ?Presented to [facility] ED 08/31/2021 with confusion inability to talk ? ?ED work-up showed no abnormality on head CT, WBC was 16 ?Lumbar puncture performed in the ED showed rare gram-positive cocci no WBC glucose 65 total protein 27 ? ?Both neurology and ID were consulted secondary to concerns for either stroke versus cryptogenic meningitis (patient had sinusitis in January 2023 and did not complete Rx) ? ?Hospital-Problem based course ? ??  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 Henrene Pastor ?Mild hypokalemia ?We will give D5 with 40 of K infusion at 100 cc/h and I cut this back to 50 cc/H in the afternoon of 3/18 ?Add K. Dur 40x1 now ?Recent multiple falls in the outpatient  setting in January ?Appreciate PT input --needs HH pt ? ?DVT prophylaxis: Lovenox ?Code Status: Full ?Family Communication: Discussed with daughter at the bedside ?Disposition:  ?Status is: Inpatient ?Remains inpatient appropriate because: Still confused ?  ?Consultants:  ?Infectious disease ?Neurology ? ?Procedures:  ? ?Antimicrobials: Multiple ? ? ?Subjective: ? ?Much more cogent coherent and seems close to baseline ? ? ?Objective: ?Vitals:  ? 09/01/21 2133 09/02/21 0423 09/02/21 0918 09/02/21 1230  ?BP: (!) 153/53 (!) 140/53 (!) 124/46 136/64  ?Pulse: (!) 55 (!) 52 93 93  ?Resp:  20  18  ?Temp: 99.9 ?F (37.7 ?C) 98.2 ?F (36.8 ?C) 97.7 ?F (36.5 ?C) 97.6 ?F (36.4 ?C)  ?TempSrc: Oral Oral Oral Oral  ?SpO2: 99% 97% 96% 100%  ?Weight:      ?Height:      ? ? ?Intake/Output Summary (Last 24 hours) at 09/02/2021 1328 ?Last data filed at 09/02/2021 1018 ?Gross per 24 hour  ?Intake 3109.85 ml  ?Output 1690 ml  ?Net 1419.85 ml  ? ? ?Filed Weights  ? 08/31/21 1711 08/31/21 1953  ?Weight: 79.8 kg 81.6 kg  ? ? ?Examination: ? ?Eomi ncat pupils ~ 4 mm ?Gcs 15 ?R submandibular LAN noted--throat not ertyhematous ?Cta b no added sound ?Power 5/5 ?Finger-nose-finger intact ?Power 5/5 ? ? ?Data Reviewed: personally reviewed  ? ?CBC ?   ?Component Value Date/Time  ? WBC 17.8 (H) 09/02/2021 0743  ? RBC 3.82 (L) 09/02/2021 0743  ? HGB 11.4 (L) 09/02/2021 0743  ? HCT 36.1 09/02/2021 0743  ? PLT 161 09/02/2021 0743  ? MCV 94.5 09/02/2021 0743  ? MCH 29.8 09/02/2021 0743  ? MCHC 31.6 09/02/2021 0743  ?  RDW 14.6 09/02/2021 0743  ? LYMPHSABS 1.8 09/02/2021 0743  ? MONOABS 1.3 (H) 09/02/2021 0743  ? EOSABS 0.0 09/02/2021 0743  ? BASOSABS 0.1 09/02/2021 0743  ? ?CMP Latest Ref Rng & Units 09/02/2021 09/01/2021 08/31/2021  ?Glucose 70 - 99 mg/dL 96 112(H) -  ?BUN 8 - 23 mg/dL 9 11 -  ?Creatinine 0.44 - 1.00 mg/dL 0.66 0.70 -  ?Sodium 135 - 145 mmol/L 136 137 -  ?Potassium 3.5 - 5.1 mmol/L 2.9(L) 3.2(L) -  ?Chloride 98 - 111 mmol/L 106 107 -  ?CO2  22 - 32 mmol/L 21(L) 23 -  ?Calcium 8.9 - 10.3 mg/dL 7.8(L) 8.0(L) -  ?Total Protein 6.5 - 8.1 g/dL - 5.6(L) 5.7(L)  ?Total Bilirubin 0.3 - 1.2 mg/dL - 0.7 0.7  ?Alkaline Phos 38 - 126 U/L - 64 59  ?AST 15 - 41 U/L - 19 11(L)  ?ALT 0 - 44 U/L - 22 9  ? ? ? ?Radiology Studies: ?MR Brain W and Wo Contrast ? ?Result Date: 09/01/2021 ?CLINICAL DATA:  76 year old female code stroke presentation yesterday. Abnormal speech. EXAM: MRI HEAD WITHOUT AND WITH CONTRAST TECHNIQUE: Multiplanar, multiecho pulse sequences of the brain and surrounding structures were obtained without and with intravenous contrast. CONTRAST:  11m GADAVIST GADOBUTROL 1 MMOL/ML IV SOLN COMPARISON:  Head CT 08/31/2021.  Brain MRI 05/06/2018. FINDINGS: Study is mildly degraded by motion artifact despite repeated imaging attempts. Brain: No restricted diffusion to suggest acute infarction. No midline shift, mass effect, evidence of mass lesion, ventriculomegaly, extra-axial collection or acute intracranial hemorrhage. Cervicomedullary junction and pituitary are within normal limits. Progressed since 2019 and now moderately advanced patchy T2 and FLAIR hyperintensity scattered in the bilateral cerebral white matter, especially the corona radiata, and involving some of the anterior deep white matter capsules on the left. Chronic T2 heterogeneity in the left caudate. No cortical encephalomalacia or chronic cerebral blood products identified. Possible chronic lacunar infarct dorsal left pons. No abnormal enhancement identified.  No dural thickening. Vascular: Major intracranial vascular flow voids are grossly stable. Skull and upper cervical spine: Grossly stable visible cervical spine. Visualized bone marrow signal is within normal limits. Sinuses/Orbits: Stable allowing for motion. Other: Mild left greater than right mastoid effusions are new or increased since 2019. And there is small volume retained secretions in the nasopharynx. IMPRESSION: 1. Mildly  motion degraded exam with no acute intracranial abnormality identified. 2. 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 Electronically Signed   By: HGenevie AnnM.D.   On: 09/01/2021 11:41  ? ?CT ABDOMEN PELVIS W CONTRAST ? ?Result Date: 08/31/2021 ?CLINICAL DATA:  Abdominal pain. EXAM: CT ABDOMEN AND PELVIS WITH CONTRAST TECHNIQUE: Multidetector CT imaging of the abdomen and pelvis was performed using the standard protocol following bolus administration of intravenous contrast. RADIATION DOSE REDUCTION: This exam was performed according to the departmental dose-optimization program which includes automated exposure control, adjustment of the mA and/or kV according to patient size and/or use of iterative reconstruction technique. CONTRAST:  1089mOMNIPAQUE IOHEXOL 300 MG/ML  SOLN COMPARISON:  CT abdomen pelvis dated 02/07/2017. FINDINGS: Lower chest: The visualized lung bases are clear. There is coronary vascular calcification. No intra-abdominal free air or free fluid. Hepatobiliary: Multiple small liver cysts similar to prior CT. No intrahepatic biliary dilatation. Cholecystectomy. No retained calcified stone noted in the central CBD. Pancreas: Unremarkable. No pancreatic ductal dilatation or surrounding inflammatory changes. Spleen: Normal in size without  focal abnormality. Adrenals/Urinary Tract: The adrenal glands unremarkable the kidneys, visualized ureters, and urinary bladder appear unremarkable. Stomach/Bowel: Postsurgical changes of the stomach. There is moderate stool throughout the colon. No bowel obstruction or active inflammation. The appendix is not visualized with certainty. No inflammatory changes identified in the right lower quadrant. Vascular/Lymphatic: Advanced aortoiliac atherosclerotic disease. The IVC is unremarkable. No portal venous gas. There is no adenopathy. Reproductive: Hysterectomy.  No adnexal  masses. Other: Midline vertical anterior abdominal wall incisional scar. Musculoskeletal: Osteopenia with degenerative changes of the spine. No acute osseous pathology. IMPRESSION: 1. No acute intra-abdomi

## 2021-09-02 NOTE — Progress Notes (Signed)
Pt unable to tolerate PO medications due to drowsiness. Provider notified. No new orders at this time. ?

## 2021-09-03 DIAGNOSIS — B349 Viral infection, unspecified: Secondary | ICD-10-CM

## 2021-09-03 DIAGNOSIS — J029 Acute pharyngitis, unspecified: Secondary | ICD-10-CM

## 2021-09-03 DIAGNOSIS — F418 Other specified anxiety disorders: Secondary | ICD-10-CM

## 2021-09-03 DIAGNOSIS — R6510 Systemic inflammatory response syndrome (SIRS) of non-infectious origin without acute organ dysfunction: Secondary | ICD-10-CM | POA: Diagnosis not present

## 2021-09-03 LAB — BASIC METABOLIC PANEL
Anion gap: 8 (ref 5–15)
BUN: 7 mg/dL — ABNORMAL LOW (ref 8–23)
CO2: 24 mmol/L (ref 22–32)
Calcium: 8 mg/dL — ABNORMAL LOW (ref 8.9–10.3)
Chloride: 108 mmol/L (ref 98–111)
Creatinine, Ser: 0.56 mg/dL (ref 0.44–1.00)
GFR, Estimated: >60 mL/min (ref >60)
Glucose, Bld: 95 mg/dL (ref 70–99)
Potassium: 3.9 mmol/L (ref 3.5–5.1)
Sodium: 140 mmol/L (ref 135–145)

## 2021-09-03 LAB — CBC WITH DIFFERENTIAL/PLATELET
Abs Immature Granulocytes: 0.06 10*3/uL (ref 0.00–0.07)
Basophils Absolute: 0.1 10*3/uL (ref 0.0–0.1)
Basophils Relative: 1 %
Eosinophils Absolute: 0.3 10*3/uL (ref 0.0–0.5)
Eosinophils Relative: 4 %
HCT: 32.6 % — ABNORMAL LOW (ref 36.0–46.0)
Hemoglobin: 10.1 g/dL — ABNORMAL LOW (ref 12.0–15.0)
Immature Granulocytes: 1 %
Lymphocytes Relative: 19 %
Lymphs Abs: 1.7 10*3/uL (ref 0.7–4.0)
MCH: 29.4 pg (ref 26.0–34.0)
MCHC: 31 g/dL (ref 30.0–36.0)
MCV: 95 fL (ref 80.0–100.0)
Monocytes Absolute: 0.7 10*3/uL (ref 0.1–1.0)
Monocytes Relative: 8 %
Neutro Abs: 5.8 10*3/uL (ref 1.7–7.7)
Neutrophils Relative %: 67 %
Platelets: 154 10*3/uL (ref 150–400)
RBC: 3.43 MIL/uL — ABNORMAL LOW (ref 3.87–5.11)
RDW: 14.5 % (ref 11.5–15.5)
WBC: 8.6 10*3/uL (ref 4.0–10.5)
nRBC: 0 % (ref 0.0–0.2)

## 2021-09-03 MED ORDER — HYDROCODONE BIT-HOMATROP MBR 5-1.5 MG/5ML PO SOLN
5.0000 mL | Freq: Four times a day (QID) | ORAL | Status: DC | PRN
  Administered 2021-09-03 – 2021-09-04 (×4): 5 mL via ORAL
  Filled 2021-09-03 (×5): qty 5

## 2021-09-03 MED ORDER — ALPRAZOLAM 0.25 MG PO TABS
0.2500 mg | ORAL_TABLET | Freq: Two times a day (BID) | ORAL | Status: DC | PRN
  Administered 2021-09-03: 0.25 mg via ORAL
  Filled 2021-09-03: qty 1

## 2021-09-03 MED ORDER — GABAPENTIN 100 MG PO CAPS
100.0000 mg | ORAL_CAPSULE | Freq: Every day | ORAL | Status: DC
  Administered 2021-09-03 – 2021-09-04 (×2): 100 mg via ORAL
  Filled 2021-09-03 (×2): qty 1

## 2021-09-03 MED ORDER — BUPROPION HCL ER (XL) 150 MG PO TB24
150.0000 mg | ORAL_TABLET | Freq: Every day | ORAL | Status: DC
  Administered 2021-09-03 – 2021-09-05 (×3): 150 mg via ORAL
  Filled 2021-09-03 (×3): qty 1

## 2021-09-03 NOTE — Progress Notes (Signed)
?   ? ? ? ? ?[facility] ? ?Date of Admission:  08/31/2021    ? ? ?Abx: ?Acyclovir ?Vanc currently off as of now ?Ceftriaxone ?ampicillin ? ?ASSESSMENT: ?Sepsis ?Encephalopathy ? ?Csf no leukocytosis, normal protein/glucose, but with gpc on stain. Can't r/o contaminant.  ?Fever resolved on empiric abx ?Blood cx and csf cx ngtd ? ?3/19 assessment ?Mentation continues to improve.  ?Csf cx ngtd ?Mri brain no acute abnormality ? ?While I do not think there is any bacterial or serious viral cns infection, we can wait until tomorrow for culture to finalize and d/c abx ? ? ?PLAN: ?Continue valtrex ?Continue amp/ceftriaxone ?If csf cx tomorrow negative can dc amp/ceftriaxone; I think it is reasonable to dc valtrex tomorrow as well but not sure if hsv pcr will be back by then (low suspicion -- will defer to Dr Baxter Flattery regarding the valtrex) ?Discussed with primary team ? ? ?Dr Baxter Flattery to round tomorrow ? ?Principal Problem: ?  SIRS (systemic inflammatory response syndrome) (HCC) ?Active Problems: ?  Dyslipidemia ?  Morbid obesity ([place]) ?  Essential hypertension ?  Sinusitis, chronic ?  Constipation ?  Severe sepsis ([place]) ?  Acute metabolic encephalopathy ?  Abnormal microbiological findings in CSF ?  Frequent PVCs ?  Fever of unknown origin (FUO) ?  Delirium ?  Meningitis ? ? ?Allergies  ?Allergen Reactions  ? Levaquin [Levofloxacin] Nausea Only  ? Omnicef [Cefdinir] Nausea And Vomiting  ?  diarrhea  ? ? ?Scheduled Meds: ? buPROPion  150 mg Oral Daily  ? darifenacin  7.5 mg Oral Daily  ? feeding supplement  237 mL Oral BID BM  ? fluticasone  2 spray Each Nare Daily  ? gabapentin  100 mg Oral QHS  ? multivitamin with minerals  1 tablet Oral Daily  ? pantoprazole  80 mg Oral Daily  ? polyethylene glycol  17 g Oral Daily  ? potassium chloride  40 mEq Oral Daily  ? valACYclovir  1,000 mg Oral TID  ? verapamil  240 mg Oral QHS  ? vitamin B-12  500 mcg Oral Daily  ? ?Continuous Infusions: ? ampicillin  (OMNIPEN) IV 2 g (09/03/21 2841)  ? cefTRIAXone (ROCEPHIN)  IV 2 g (09/03/21 0414)  ? ?PRN Meds:.acetaminophen **OR** acetaminophen, ALPRAZolam, benzonatate, HYDROcodone bit-homatropine, ibuprofen, LORazepam ? ? ?SUBJECTIVE: ?Csf cx ngtd ?No fever, chill ?No headache ?No n/v ?No rash ?No diarrhea ?Eating well ?Ambulating within room ? ?Review of Systems: ?ROS ?All other ROS was negative, except mentioned above ? ? ? ? ?OBJECTIVE: ?Vitals:  ? 09/02/21 1230 09/02/21 1634 09/02/21 2001 09/03/21 0359  ?BP: 136/64 110/60 (!) 147/67 127/77  ?Pulse: 93 92 87 83  ?Resp: '18 18 18 18  '$ ?Temp: 97.6 ?F (36.4 ?C) 97.6 ?F (36.4 ?C) 98.1 ?F (36.7 ?C) 97.9 ?F (36.6 ?C)  ?TempSrc: Oral Oral Oral Oral  ?SpO2: 100% 98% 97% 96%  ?Weight:      ?Height:      ? ?Body mass index is 34.01 kg/m?. ? ?Physical Exam ?General/constitutional: no distress, pleasant; sitting in chair; fully conversant ?HEENT: Normocephalic, PER, Conj Clear, EOMI, Oropharynx clear ?Neck supple ?CV: rrr no mrg ?Lungs: clear to auscultation, normal respiratory effort ?Abd: Soft, Nontender ?Ext: no edema ?Skin: No Rash ?Neuro: nonfocal ? ? ? ?Lab Results ?Lab Results  ?Component Value Date  ? WBC 8.6 09/03/2021  ? HGB 10.1 (L) 09/03/2021  ? HCT 32.6 (L) 09/03/2021  ? MCV 95.0 09/03/2021  ? PLT 154 09/03/2021  ?  ?  Lab Results  ?Component Value Date  ? CREATININE 0.56 09/03/2021  ? BUN 7 (L) 09/03/2021  ? NA 140 09/03/2021  ? K 3.9 09/03/2021  ? CL 108 09/03/2021  ? CO2 24 09/03/2021  ?  ?Lab Results  ?Component Value Date  ? ALT 22 09/01/2021  ? AST 19 09/01/2021  ? ALKPHOS 64 09/01/2021  ? BILITOT 0.7 09/01/2021  ?  ? ? ?Microbiology: ?Recent Results (from the past 240 hour(s))  ?Resp Panel by RT-PCR (Flu A&B, Covid) Nasopharyngeal Swab     Status: None  ? Collection Time: 08/31/21  5:15 PM  ? Specimen: Nasopharyngeal Swab; Nasopharyngeal(NP) swabs in vial transport medium  ?Result Value Ref Range Status  ? SARS Coronavirus 2 by RT PCR NEGATIVE NEGATIVE Final  ?   Comment: (NOTE) ?SARS-CoV-2 target nucleic acids are NOT DETECTED. ? ?The SARS-CoV-2 RNA is generally detectable in upper respiratory ?specimens during the acute phase of infection. The lowest ?concentration of SARS-CoV-2 viral copies this assay can detect is ?138 copies/mL. A negative result does not preclude SARS-Cov-2 ?infection and should not be used as the sole basis for treatment or ?other patient management decisions. A negative result may occur with  ?improper specimen collection/handling, submission of specimen other ?than nasopharyngeal swab, presence of viral mutation(s) within the ?areas targeted by this assay, and inadequate number of viral ?copies(<138 copies/mL). A negative result must be combined with ?clinical observations, patient history, and epidemiological ?information. The expected result is Negative. ? ?Fact Sheet for Patients:  ?EntrepreneurPulse.com.au ? ?Fact Sheet for Healthcare Providers:  ?IncredibleEmployment.be ? ?This test is no t yet approved or cleared by the [place] FDA and  ?has been authorized for detection and/or diagnosis of SARS-CoV-2 by ?FDA under an Emergency Use Authorization (EUA). This EUA will remain  ?in effect (meaning this test can be used) for the duration of the ?COVID-19 declaration under Section 564(b)(1) of the Act, 21 ?U.S.C.section 360bbb-3(b)(1), unless the authorization is terminated  ?or revoked sooner.  ? ? ?  ? Influenza A by PCR NEGATIVE NEGATIVE Final  ? Influenza B by PCR NEGATIVE NEGATIVE Final  ?  Comment: (NOTE) ?The Xpert Xpress SARS-CoV-2/FLU/RSV plus assay is intended as an aid ?in the diagnosis of influenza from Nasopharyngeal swab specimens and ?should not be used as a sole basis for treatment. Nasal washings and ?aspirates are unacceptable for Xpert Xpress SARS-CoV-2/FLU/RSV ?testing. ? ?Fact Sheet for Patients: ?EntrepreneurPulse.com.au ? ?Fact Sheet for Healthcare  Providers: ?IncredibleEmployment.be ? ?This test is not yet approved or cleared by the [place] FDA and ?has been authorized for detection and/or diagnosis of SARS-CoV-2 by ?FDA under an Emergency Use Authorization (EUA). This EUA will remain ?in effect (meaning this test can be used) for the duration of the ?COVID-19 declaration under Section 564(b)(1) of the Act, 21 U.S.C. ?section 360bbb-3(b)(1), unless the authorization is terminated or ?revoked. ? ?Performed at [facility], [address] ?[place], [place] [address] ?  ?Respiratory (~20 pathogens) panel by PCR     Status: None  ? Collection Time: 08/31/21  5:18 PM  ? Specimen: Nasopharyngeal Swab; Respiratory  ?Result Value Ref Range Status  ? Adenovirus NOT DETECTED NOT DETECTED Final  ? Coronavirus 229E NOT DETECTED NOT DETECTED Final  ?  Comment: (NOTE) ?The Coronavirus on the Respiratory Panel, DOES NOT test for the novel  ?Coronavirus (2019 nCoV) ?  ? Coronavirus HKU1 NOT DETECTED NOT DETECTED Final  ? Coronavirus NL63 NOT DETECTED NOT DETECTED Final  ? Coronavirus OC43  NOT DETECTED NOT DETECTED Final  ? Metapneumovirus NOT DETECTED NOT DETECTED Final  ? Rhinovirus / Enterovirus NOT DETECTED NOT DETECTED Final  ? Influenza A NOT DETECTED NOT DETECTED Final  ? Influenza B NOT DETECTED NOT DETECTED Final  ? Parainfluenza Virus 1 NOT DETECTED NOT DETECTED Final  ? Parainfluenza Virus 2 NOT DETECTED NOT DETECTED Final  ? Parainfluenza Virus 3 NOT DETECTED NOT DETECTED Final  ? Parainfluenza Virus 4 NOT DETECTED NOT DETECTED Final  ? Respiratory Syncytial Virus NOT DETECTED NOT DETECTED Final  ? Bordetella pertussis NOT DETECTED NOT DETECTED Final  ? Bordetella Parapertussis NOT DETECTED NOT DETECTED Final  ? Chlamydophila pneumoniae NOT DETECTED NOT DETECTED Final  ? Mycoplasma pneumoniae NOT DETECTED NOT DETECTED Final  ?  Comment: Performed at [facility], [place] [address] [place], [place] [address]  ?Urine  Culture     Status: None  ? Collection Time: 08/31/21  6:01 PM  ? Specimen: Urine, Catheterized  ?Result Value Ref Range Status  ? Specimen Description   Final  ?  URINE, CATHETERIZED ?Performed at [facility], 24

## 2021-09-03 NOTE — Progress Notes (Signed)
Physical Therapy Treatment ?Patient Details ?Name: [name] ?MRN: [id number] ?DOB: 04-09-1946 ?Today's Date: 09/03/2021 ? ? ?History of Present Illness 76 y.o. female with PMHx of HTN, HLD, CAD, B12 deficiency, and depression who presented to the ED 3/16 for evaluation of confusion, disorientation, and delay of speech (family reports inability of patient to complete sentences). Initial work up included negative, without evidence of infection,  CXR negative, initial CSF without concern for infectious process.MRI brain w+wo contrast without acute intracranial abnormality.   EEG 3/17 suggestive of moderate diffuse encephalopathy without seizures or epileptiform discharges ? ?  ?PT Comments  ? ? Pt making excellent progress.  Incr gait distance and dynamic balance improved. HHPT vs no f/u pending continued progress. Cognition back to baseline, pt A&O x3 at time of PT session  ?Recommendations for follow up therapy are one component of a multi-disciplinary discharge planning process, led by the attending physician.  Recommendations may be updated based on patient status, additional functional criteria and insurance authorization. ? ?Follow Up Recommendations ? Other (comment) (HHPT vs no f/u pending progress) ?  ?  ?Assistance Recommended at Discharge Intermittent Supervision/Assistance  ?Patient can return home with the following Help with stairs or ramp for entrance;Assistance with cooking/housework;Assist for transportation ?  ?Equipment Recommendations ? None recommended by PT  ?  ?Recommendations for Other Services   ? ? ?  ?Precautions / Restrictions Precautions ?Precautions: Fall ?Restrictions ?Weight Bearing Restrictions: No  ?  ? ?Mobility ? Bed Mobility ?  ?  ?  ?  ?  ?  ?  ?General bed mobility comments: in recliner ?  ? ?Transfers ?Overall transfer level: Needs assistance ?Equipment used: 1 person hand held assist, None ?Transfers: Sit to/from Stand ?Sit to Stand: Min guard, Supervision ?  ?  ?  ?  ?   ?General transfer comment: cues for safety, control of descent ?  ? ?Ambulation/Gait ?Ambulation/Gait assistance: Min guard, Supervision ?Gait Distance (Feet): 340 Feet (15' x 2 with IV pole) ?Assistive device: 1 person hand held assist, None, IV Pole ?Gait Pattern/deviations: Step-through pattern, Decreased stride length, Trunk flexed, Wide base of support ?  ?  ?  ?General Gait Details: improved gait stability, no overt LOB-  initial unsteadiness which improved with distance. ? ? ?Stairs ?  ?  ?  ?  ?  ? ? ?Wheelchair Mobility ?  ? ?Modified Rankin (Stroke Patients Only) ?  ? ? ?  ?Balance Overall balance assessment: Needs assistance ?Sitting-balance support: Feet supported, No upper extremity supported ?Sitting balance-Leahy Scale: Good ?  ?  ?Standing balance support: No upper extremity supported, During functional activity ?Standing balance-Leahy Scale: Good ?Standing balance comment: Fair to good not tested to mod gait challenges ?  ?  ?  ?  ?  ?  ?High level balance activites: Side stepping, Backward walking, Direction changes, Turns, Head turns ?High Level Balance Comments: no LOB,min/guard for safety ?  ?  ?  ?  ? ?  ?Cognition Arousal/Alertness: Awake/alert ?Behavior During Therapy: [name] for tasks assessed/performed ?Overall Cognitive Status: Within Functional Limits for tasks assessed ?  ?  ?  ?  ?  ?  ?  ?  ?  ?  ?  ?  ?  ?  ?  ?  ?General Comments: Ox4 ?  ?  ? ?  ?Exercises   ? ?  ?General Comments   ?  ?  ? ?Pertinent Vitals/Pain Pain Assessment ?Pain Assessment: No/denies pain  ? ? ?Home Living   ?  ?  ?  ?  ?  ?  ?  ?  ?  ?   ?  ?  Prior Function    ?  ?  ?   ? ?PT Goals (current goals can now be found in the care plan section) Acute Rehab PT Goals ?Patient Stated Goal: home soon ?PT Goal Formulation: With patient ?Time For Goal Achievement: 09/22/21 ?Potential to Achieve Goals: Good ?Progress towards PT goals: Progressing toward goals ? ?  ?Frequency ? ? ? Min 3X/week ? ? ? ?  ?PT Plan Current plan  remains appropriate  ? ? ?Co-evaluation   ?  ?  ?  ?  ? ?  ?AM-PAC PT "6 Clicks" Mobility   ?Outcome Measure ? Help needed turning from your back to your side while in a flat bed without using bedrails?: None ?Help needed moving from lying on your back to sitting on the side of a flat bed without using bedrails?: None ?Help needed moving to and from a bed to a chair (including a wheelchair)?: None ?Help needed standing up from a chair using your arms (e.g., wheelchair or bedside chair)?: A Little ?Help needed to walk in hospital room?: A Little ?Help needed climbing 3-5 steps with a railing? : A Little ?6 Click Score: 21 ? ?  ?End of Session   ?Activity Tolerance: Patient tolerated treatment well ?Patient left: in chair;with call bell/phone within reach;with chair alarm set ?Nurse Communication: Mobility status ?PT Visit Diagnosis: Unsteadiness on feet (R26.81);Other abnormalities of gait and mobility (R26.89) ?  ? ? ?Time: 1610-9604 ?PT Time Calculation (min) (ACUTE ONLY): 13 min ? ?Charges:  $Gait Training: 8-22 mins          ?          ? [name], PT ? ?Acute Rehab Dept [name]) [phone] ?Pager [phone] ? ?09/03/2021 ? ? ? ?[name] ?09/03/2021, 1:13 PM ? ?

## 2021-09-03 NOTE — Progress Notes (Signed)
?PROGRESS NOTE ? ? ?[name]  MWU:[id number] DOB: 1945-07-08 DOA: 08/31/2021 ?PCP: Plotnikov, Evie Lacks, MD  ?Brief Narrative:  ? ?68 wall white female community dwelling (lives alone) ?Prior SBO's 2/2 jejunojejunostomy intussusception back in 2018--patient underwent ex lap with LOA 01/30/2017 ?Barrett's esophagus and sees Dr. Henrene Pastor for severe constipation ?HTN ?migraine ?B12 deficiency prior diverticulosis prior knee surgeries ?She has sustained prior falls in January 2023 ? ?Antecedent new h/o takes care of great grandchildren--5 y old had mild URI sympt--patient herself had sore throat cough ~ 4 days prior  to 3/26 ? ?Presented to [facility] ED 08/31/2021 with confusion inability to talk ? ?ED work-up showed no abnormality on head CT, WBC was 16 ?Lumbar puncture performed in the ED showed rare gram-positive cocci no WBC glucose 65 total protein 27 ? ?Both neurology and ID were consulted secondary to concerns for either stroke versus cryptogenic meningitis (patient had sinusitis in January 2023 and did not complete Rx) ? ?Hospital-Problem based course ? ??  Meningitis ?CSF shows only few cells with no white cells with NGTD since 3/16  ?Switched to Valtrex 1 g 3 times daily, continuing ampicillin ceftriaxone additionally until cultures finalize ?Recent sore throat ?Await strep throat testing morning-is on empiric coverage regardless-she has a headache with cough so I will get a chest x-ray in the morning and I have started Hycodan syrup at low-dose0 ?Anxiety/depression ?Resume Xanax 0.5 twice daily gabapentin at bedtime ?Elevated blood pressure sinus tachycardia ?Resumed Cardizem 240 ?Having some trigeminy on monitors--keep tele today ?Toxic metabolic encephalopathy ?Etiology infectious related however rare causes possible- ?Imaging, b12,tsh EEG is neg  ?Hold  trazodone 50 for now--as an outpatient attempt should be made at minimizing polypharmacy given her age, beers criteria meds ?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 Henrene Pastor ?Mild hypokalemia ?Saline lock IV with K-repeat labs periodically ?Recent multiple falls in the outpatient setting in January ?Appreciate PT input --needs HH pt ? ?DVT prophylaxis: Lovenox ?Code Status: Full ?Family Communication: Discussed with daughter at the bedside ?Disposition:  ?Status is: Inpatient ?Remains inpatient appropriate because: Still confused ?  ?Consultants:  ?Infectious disease ?Neurology ? ?Procedures:  ? ?Antimicrobials: Multiple ? ? ?Subjective: ? ?Coughing some today-also states has headache-was taking Hycodan syrup at home which we resumed ?Does not have chest pain or fever ?No chills no rigors ?Overall is improved from prior but we are not sure if she has a meningitis still ? ? ?Objective: ?Vitals:  ? 09/02/21 1230 09/02/21 1634 09/02/21 2001 09/03/21 0359  ?BP: 136/64 110/60 (!) 147/67 127/77  ?Pulse: 93 92 87 83  ?Resp: '18 18 18 18  '$ ?Temp: 97.6 ?F (36.4 ?C) 97.6 ?F (36.4 ?C) 98.1 ?F (36.7 ?C) 97.9 ?F (36.6 ?C)  ?TempSrc: Oral Oral Oral Oral  ?SpO2: 100% 98% 97% 96%  ?Weight:      ?Height:      ? ? ?Intake/Output Summary (Last 24 hours) at 09/03/2021 0914 ?Last data filed at 09/03/2021 7253 ?Gross per 24 hour  ?Intake 1623.85 ml  ?Output 502 ml  ?Net 1121.85 ml  ? ? ?Filed Weights  ? 08/31/21 1711 08/31/21 1953  ?Weight: 79.8 kg 81.6 kg  ? ? ?Examination: ?No icterus no pallor neck soft supple  ?Gcs 15 ?R submandibular LAN noted--throat not ertyhematous ?Cta b no added sound with good air entry ?Power 5/5 ?Finger-nose-finger intact ? ? ? ?Data Reviewed: personally reviewed  ? ?CBC ?   ?  Component Value Date/Time  ? WBC 8.6 09/03/2021 0509  ? RBC 3.43 (L) 09/03/2021 0509  ? HGB 10.1 (L) 09/03/2021 0509  ? HCT 32.6 (L) 09/03/2021 0509  ? PLT 154 09/03/2021 0509  ? MCV 95.0 09/03/2021 0509  ? MCH 29.4 09/03/2021 0509  ? MCHC 31.0 09/03/2021  0509  ? RDW 14.5 09/03/2021 0509  ? LYMPHSABS 1.7 09/03/2021 0509  ? MONOABS 0.7 09/03/2021 0509  ? EOSABS 0.3 09/03/2021 0509  ? BASOSABS 0.1 09/03/2021 0509  ? ?CMP Latest Ref Rng & Units 09/03/2021 09/02/2021 09/01/2021  ?Glucose 70 - 99 mg/dL 95 96 112(H)  ?BUN 8 - 23 mg/dL 7(L) 9 11  ?Creatinine 0.44 - 1.00 mg/dL 0.56 0.66 0.70  ?Sodium 135 - 145 mmol/L 140 136 137  ?Potassium 3.5 - 5.1 mmol/L 3.9 2.9(L) 3.2(L)  ?Chloride 98 - 111 mmol/L 108 106 107  ?CO2 22 - 32 mmol/L 24 21(L) 23  ?Calcium 8.9 - 10.3 mg/dL 8.0(L) 7.8(L) 8.0(L)  ?Total Protein 6.5 - 8.1 g/dL - - 5.6(L)  ?Total Bilirubin 0.3 - 1.2 mg/dL - - 0.7  ?Alkaline Phos 38 - 126 U/L - - 64  ?AST 15 - 41 U/L - - 19  ?ALT 0 - 44 U/L - - 22  ? ? ? ?Radiology Studies: ?MR Brain W and Wo Contrast ? ?Result Date: 09/01/2021 ?CLINICAL DATA:  76 year old female code stroke presentation yesterday. Abnormal speech. EXAM: MRI HEAD WITHOUT AND WITH CONTRAST TECHNIQUE: Multiplanar, multiecho pulse sequences of the brain and surrounding structures were obtained without and with intravenous contrast. CONTRAST:  1m GADAVIST GADOBUTROL 1 MMOL/ML IV SOLN COMPARISON:  Head CT 08/31/2021.  Brain MRI 05/06/2018. FINDINGS: Study is mildly degraded by motion artifact despite repeated imaging attempts. Brain: No restricted diffusion to suggest acute infarction. No midline shift, mass effect, evidence of mass lesion, ventriculomegaly, extra-axial collection or acute intracranial hemorrhage. Cervicomedullary junction and pituitary are within normal limits. Progressed since 2019 and now moderately advanced patchy T2 and FLAIR hyperintensity scattered in the bilateral cerebral white matter, especially the corona radiata, and involving some of the anterior deep white matter capsules on the left. Chronic T2 heterogeneity in the left caudate. No cortical encephalomalacia or chronic cerebral blood products identified. Possible chronic lacunar infarct dorsal left pons. No abnormal  enhancement identified.  No dural thickening. Vascular: Major intracranial vascular flow voids are grossly stable. Skull and upper cervical spine: Grossly stable visible cervical spine. Visualized bone marrow signal is within normal limits. Sinuses/Orbits: Stable allowing for motion. Other: Mild left greater than right mastoid effusions are new or increased since 2019. And there is small volume retained secretions in the nasopharynx. IMPRESSION: 1. Mildly motion degraded exam with no acute intracranial abnormality identified. 2. 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 Electronically Signed   By: HGenevie AnnM.D.   On: 09/01/2021 11:41  ? ?EEG adult ? ?Result Date: 09/01/2021 ?YLora Havens MD     09/01/2021  6:27 PM Patient Name: LNESHA COUNIHANMRN: [id number] Attending: PLora HavensReferring Physician/Provider: TDomenic Moras PA-C Date: 09/02/2019 Duration: 22.10 mins Patient history: 89yoF with ams. EEG to evaluate for seizure Level of alertness: Awake AEDs during EEG study: None Technical aspects: This EEG study was done with scalp electrodes positioned according to the 10-20 International system of electrode placement. Electrical activity was acquired at a sampling rate of '500Hz'$  and reviewed with a high frequency  filter of '70Hz'$  and a low frequency filter of '1Hz'$ . EEG data were recorded continuously and digitally stored. Description: No clear posterior dominant rhythm was seen. EEG showed continuous generalized polymorphic 6-'9hz'$  Hz theta-alpha activity admixed with intermittent generalized 2-'3Hz'$  delta slowing, at times with triphasic morphology. Hyperventilation and photic stimulation were not performed.   ABNORMALITY - Continuous slow, generalized IMPRESSION: This study is suggestive of moderate diffuse encephalopathy, nonspecific etiology. No seizures or epileptiform discharges were seen throughout the  recording. Priyanka Barbra Sarks  ? ?ECHOCARDIOGRAM COMPLETE ? ?Result Date: 09/01/2021 ?   ECHOCARDIOGRAM REPORT   Patient Name:   LEEYAH HEATHER Date of Exam: 09/01/2021 Medical Rec #:  [id number]     Height:       61.0 in Ac

## 2021-09-04 DIAGNOSIS — R6510 Systemic inflammatory response syndrome (SIRS) of non-infectious origin without acute organ dysfunction: Secondary | ICD-10-CM | POA: Diagnosis not present

## 2021-09-04 LAB — COMPREHENSIVE METABOLIC PANEL
ALT: 13 U/L (ref 0–44)
AST: 8 U/L — ABNORMAL LOW (ref 15–41)
Albumin: 2.6 g/dL — ABNORMAL LOW (ref 3.5–5.0)
Alkaline Phosphatase: 60 U/L (ref 38–126)
Anion gap: 4 — ABNORMAL LOW (ref 5–15)
BUN: 7 mg/dL — ABNORMAL LOW (ref 8–23)
CO2: 25 mmol/L (ref 22–32)
Calcium: 8.1 mg/dL — ABNORMAL LOW (ref 8.9–10.3)
Chloride: 110 mmol/L (ref 98–111)
Creatinine, Ser: 0.59 mg/dL (ref 0.44–1.00)
GFR, Estimated: >60 mL/min (ref >60)
Glucose, Bld: 91 mg/dL (ref 70–99)
Potassium: 3.9 mmol/L (ref 3.5–5.1)
Sodium: 139 mmol/L (ref 135–145)
Total Bilirubin: 0.3 mg/dL (ref 0.3–1.2)
Total Protein: 5.6 g/dL — ABNORMAL LOW (ref 6.5–8.1)

## 2021-09-04 LAB — CSF CULTURE W GRAM STAIN
Culture: NO GROWTH
Gram Stain: NONE SEEN

## 2021-09-04 LAB — MAGNESIUM: Magnesium: 2 mg/dL (ref 1.7–2.4)

## 2021-09-04 MED ORDER — KETOROLAC TROMETHAMINE 15 MG/ML IJ SOLN: 15.0000 mg | Freq: Once | INTRAMUSCULAR | Status: DC

## 2021-09-04 NOTE — Progress Notes (Signed)
The patient expressed concerns regarding the potassium 40 meq. dose . She stated that she has previously vomited with each dose. She requests to hold the dose until speaking with provider. Will update provider and continue to monitor.  ?

## 2021-09-04 NOTE — Care Management Important Message (Signed)
Important Message ? ?Patient Details IM Letter given to the Patient. ?Name: [name] ?MRN: [id number] ?Date of Birth: 01/31/1946 ? ? ?Medicare Important Message Given:  Yes ? ? ? ? ?Kerin Salen ?09/04/2021, 2:32 PM ?

## 2021-09-04 NOTE — Progress Notes (Signed)
?PROGRESS NOTE ? ? ?[name]  XFG:[id number] DOB: 1946/04/13 DOA: 08/31/2021 ?PCP: Plotnikov, Evie Lacks, MD  ?Brief Narrative:  ? ?27 wall white female community dwelling (lives alone) ?Prior SBO's 2/2 jejunojejunostomy intussusception back in 2018--patient underwent ex lap with LOA 01/30/2017 ?Barrett's esophagus and sees Dr. Henrene Pastor for severe constipation ?HTN ?migraine ?B12 deficiency prior diverticulosis prior knee surgeries ?She has sustained prior falls in January 2023 ? ?Antecedent new h/o takes care of great grandchildren--5 y old had mild URI sympt--patient herself had sore throat cough ~ 4 days prior  to 3/26 ? ?Presented to [facility] ED 08/31/2021 with confusion inability to talk ? ?ED work-up showed no abnormality on head CT, WBC was 16 ?Lumbar puncture performed in the ED showed rare gram-positive cocci no WBC glucose 65 total protein 27 ? ?Both neurology and ID were consulted secondary to concerns for either stroke versus cryptogenic meningitis (patient had sinusitis in January 2023 and did not complete Rx) ? ?Hospital-Problem based course ? ??  Meningitis ?CSF shows only few cells with no white cells with NGTD since 3/16  ?Switched to Valtrex 1 g 3 times daily,-ID discontinued other meds 3/20 ?Recent sore throat ?Await strep throat testing morning-is on empiric coverage regardless- ?We decided to forego chest x-ray as she is feeling so much better ?Continue Hycodan syrup and dextromethorphan and will give a prescription on discharge ?Anxiety/depression ?Resume Xanax 0.5 twice daily gabapentin at bedtime ?Elevated blood pressure  ?sinus tachycardia with trigeminy earlier in hospitalization ?Resumed Cardizem 240-resolved with ?Toxic metabolic encephalopathy ?Etiology infectious related however rare causes possible- ?Imaging, b12,tsh EEG is neg  ?Hold trazodone 50 for now--as an outpatient attempt should be made at minimizing polypharmacy given her age, beers criteria meds ?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 Henrene Pastor ?Mild hypokalemia ?Saline lock IV with K-repeat labs periodically ?Recent multiple falls in the outpatient setting in January ?Appreciate PT input --needs HH pt as an outpatient because of recent falls ? ?DVT prophylaxis: Lovenox ?Code Status: Full ?Family Communication: Called daughter Venida Jarvis [phone] ?Disposition:  ?Status is: Inpatient ?Remains inpatient appropriate because: Still confused ?  ?Consultants:  ?Infectious disease ?Neurology ? ?Procedures:  ? ?Antimicrobials: Multiple ? ? ?Subjective: ? ?Overall much better-headache is better ?We did not do an x-ray she is eating and drinking she does not recall some of our discussions for some reason so I told her I would talk to her daughter ? ? ?Objective: ?Vitals:  ? 09/03/21 1343 09/03/21 2121 09/04/21 0430 09/04/21 1342  ?BP: (!) 145/63 (!) 147/63 (!) 143/65 (!) 122/49  ?Pulse: 78 72 73 79  ?Resp: '14 18 20 14  '$ ?Temp: 97.9 ?F (36.6 ?C) 98 ?F (36.7 ?C) 98.4 ?F (36.9 ?C) 98 ?F (36.7 ?C)  ?TempSrc: Oral Oral Oral Oral  ?SpO2: 98% 96% 95% 96%  ?Weight:      ?Height:      ? ? ?Intake/Output Summary (Last 24 hours) at 09/04/2021 1634 ?Last data filed at 09/04/2021 1300 ?Gross per 24 hour  ?Intake 1553.43 ml  ?Output 4 ml  ?Net 1549.43 ml  ? ? ?Filed Weights  ? 08/31/21 1711 08/31/21 1953  ?Weight: 79.8 kg 81.6 kg  ? ? ?Examination: ? ?Pleasant coherent no distress awake alert ?Tolerating diet ?Chest clear no added sound no rales no rhonchi ?ROM intact ?No focal deficit no deficit power ?Some mild submandibular lymphadenopathy ?Throat culture ? ? ? ?Data Reviewed:  personally reviewed  ? ?CBC ?   ?Component Value Date/Time  ? WBC 8.6 09/03/2021 0509  ? RBC 3.43 (L) 09/03/2021 0509  ? HGB 10.1 (L) 09/03/2021 0509  ? HCT 32.6 (L) 09/03/2021 0509  ? PLT 154 09/03/2021 0509  ? MCV 95.0 09/03/2021 0509  ? MCH 29.4  09/03/2021 0509  ? MCHC 31.0 09/03/2021 0509  ? RDW 14.5 09/03/2021 0509  ? LYMPHSABS 1.7 09/03/2021 0509  ? MONOABS 0.7 09/03/2021 0509  ? EOSABS 0.3 09/03/2021 0509  ? BASOSABS 0.1 09/03/2021 0509  ? ?CMP Latest Ref Rng & Units 09/04/2021 09/03/2021 09/02/2021  ?Glucose 70 - 99 mg/dL 91 95 96  ?BUN 8 - 23 mg/dL 7(L) 7(L) 9  ?Creatinine 0.44 - 1.00 mg/dL 0.59 0.56 0.66  ?Sodium 135 - 145 mmol/L 139 140 136  ?Potassium 3.5 - 5.1 mmol/L 3.9 3.9 2.9(L)  ?Chloride 98 - 111 mmol/L 110 108 106  ?CO2 22 - 32 mmol/L 25 24 21(L)  ?Calcium 8.9 - 10.3 mg/dL 8.1(L) 8.0(L) 7.8(L)  ?Total Protein 6.5 - 8.1 g/dL 5.6(L) - -  ?Total Bilirubin 0.3 - 1.2 mg/dL 0.3 - -  ?Alkaline Phos 38 - 126 U/L 60 - -  ?AST 15 - 41 U/L 8(L) - -  ?ALT 0 - 44 U/L 13 - -  ? ? ? ?Radiology Studies: ?No results found. ? ? ?Scheduled Meds: ? buPROPion  150 mg Oral Daily  ? darifenacin  7.5 mg Oral Daily  ? feeding supplement  237 mL Oral BID BM  ? fluticasone  2 spray Each Nare Daily  ? gabapentin  100 mg Oral QHS  ? multivitamin with minerals  1 tablet Oral Daily  ? pantoprazole  80 mg Oral Daily  ? polyethylene glycol  17 g Oral Daily  ? valACYclovir  1,000 mg Oral TID  ? verapamil  240 mg Oral QHS  ? vitamin B-12  500 mcg Oral Daily  ? ?Continuous Infusions: ? ? ? LOS: 3 days  ? ?Time spent: 24 ? ?Nita Sells, MD ?[facility] ?To contact the attending provider between 7A-7P or the covering provider during after hours 7P-7A, please log into the web site [email or web address] and access using universal [place] password for that web site. If you do not have the password, please call the hospital operator. ? ?09/04/2021, 4:34 PM  ? ? ?

## 2021-09-04 NOTE — Progress Notes (Signed)
Mobility Specialist - Progress Note ? ? ? 09/04/21 1255  ?Mobility  ?Activity Ambulated with assistance in hallway;Ambulated with assistance to bathroom  ?Level of Assistance Standby assist, set-up cues, supervision of patient - no hands on  ?Assistive Device Other (Comment) ?(IV Pole)  ?Distance Ambulated (ft) 320 ft  ?Activity Response Tolerated well  ?$Mobility charge 1 Mobility  ? ?Pt in bed upon entry and was agreeable to mobilize. Requested to use restroom and pushed IV pole for stability. After voiding, pt ambulated ~320 ft in hallway. No reports of pain made. Pt returned to room and left sitting up in recliner with lunch on tray. Call bell and all other necessities in reach.  ? ?Kinslea Frances S. ?Mobility Specialist ?[facility] ?Phone: [phone] ?09/04/21, 12:58 PM ? ? ? ?

## 2021-09-04 NOTE — Progress Notes (Signed)
?   ? ? ? ? ?[place] [facility] ? ?Date of Admission:  08/31/2021    ? ? ?Total days of antibiotics 3  ? Valtrex PO  ?      ? ?ASSESSMENT: ?[name] is a 76 y.o. female admitted from home with acute encephalopathy that remains unexplained. She has fortunately improved "nearly to 100%" by her account. Fall back in October 2022 resulting in large hematoma that on imaging here has resolved. We spent time reviewing studies that have resulted including a fairly pristine LP that yielded no WBC. CSF cultures are no growth, final read. HSV pcr on CSF is pending, hopes for this to be back tomorrow. Lower suspicion, but OK to continue valtrex until tomorrow. She had 4d history of sore throat / cough prior to admission and h/o 5yo granddaughter who had mild URI symptoms. Possible she had early viral meningitis vs polypharmacy consideration, though latter would not explain fever.  ? ?I think D/C home tomorrow is a reasonable goal for her - she has family around that is helpful and neighbors that can check in frequently on her.  ? ? ? ?PLAN: ?Continue valtrex for today ?FU HSV PCR tomorrow ?Stop any remaining antibiotics ? ? ?Principal Problem: ?  SIRS (systemic inflammatory response syndrome) (HCC) ?Active Problems: ?  Dyslipidemia ?  Morbid obesity ([place]) ?  Essential hypertension ?  Sinusitis, chronic ?  Constipation ?  Severe sepsis ([place]) ?  Acute metabolic encephalopathy ?  Abnormal microbiological findings in CSF ?  Frequent PVCs ?  Fever of unknown origin (FUO) ?  Delirium ?  Meningitis ?  Viral syndrome ? ? ? buPROPion  150 mg Oral Daily  ? darifenacin  7.5 mg Oral Daily  ? feeding supplement  237 mL Oral BID BM  ? fluticasone  2 spray Each Nare Daily  ? gabapentin  100 mg Oral QHS  ? multivitamin with minerals  1 tablet Oral Daily  ? pantoprazole  80 mg Oral Daily  ? polyethylene glycol  17 g Oral Daily  ? potassium chloride  40 mEq Oral Daily  ? valACYclovir  1,000 mg Oral TID  ? verapamil  240  mg Oral QHS  ? vitamin B-12  500 mcg Oral Daily  ? ? ?SUBJECTIVE: ?[name] feels much better today. Nearly back 100% to baseline. She is hopeful we can figure out why she was so confused. We spent time recapping PTA events and early admission history. She recalls very little, if anything at all until more recently.  ? ? ? ?Review of Systems: ?Review of Systems  ?Constitutional:  Negative for chills and fever.  ?HENT:  Negative for tinnitus.   ?Eyes:  Negative for blurred vision and photophobia.  ?Respiratory:  Negative for cough and sputum production.   ?Cardiovascular:  Negative for chest pain.  ?Gastrointestinal:  Negative for diarrhea, nausea and vomiting.  ?Genitourinary:  Negative for dysuria.  ?Musculoskeletal:  Negative for back pain and neck pain.  ?Skin:  Negative for rash.  ?Neurological:  Negative for dizziness, focal weakness, weakness and headaches.  ? ?Allergies  ?Allergen Reactions  ? Levaquin [Levofloxacin] Nausea Only  ? Omnicef [Cefdinir] Nausea And Vomiting  ?  diarrhea  ? ? ?OBJECTIVE: ?Vitals:  ? 09/03/21 0359 09/03/21 1343 09/03/21 2121 09/04/21 0430  ?BP: 127/77 (!) 145/63 (!) 147/63 (!) 143/65  ?Pulse: 83 78 72 73  ?Resp: '18 14 18 20  '$ ?Temp: 97.9 ?F (36.6 ?C) 97.9 ?F (36.6 ?C) 98 ?  F (36.7 ?C) 98.4 ?F (36.9 ?C)  ?TempSrc: Oral Oral Oral Oral  ?SpO2: 96% 98% 96% 95%  ?Weight:      ?Height:      ? ?Body mass index is 34.01 kg/m?. ? ? ?Physical Exam ?Vitals and nursing note reviewed.  ?Constitutional:   ?   Appearance: Normal appearance. She is not ill-appearing.  ?HENT:  ?   Mouth/Throat:  ?   Mouth: Mucous membranes are moist.  ?   Pharynx: Oropharynx is clear.  ?Eyes:  ?   General: No scleral icterus. ?Neck:  ?   Comments: Moving head/neck around freely from side to side during conversation. No restricted ROM.  ?Cardiovascular:  ?   Rate and Rhythm: Normal rate and regular rhythm.  ?Pulmonary:  ?   Effort: Pulmonary effort is normal.  ?Abdominal:  ?   General: Bowel sounds are normal.  ?    Palpations: Abdomen is soft.  ?Musculoskeletal:  ?   Cervical back: No rigidity or tenderness.  ?Skin: ?   General: Skin is warm and dry.  ?   Capillary Refill: Capillary refill takes less than 2 seconds.  ?   Findings: No rash.  ?Neurological:  ?   Mental Status: She is oriented to person, place, and time.  ?Psychiatric:     ?   Mood and Affect: Mood normal.     ?   Thought Content: Thought content normal.  ? ? ? ?Lab Results ?Lab Results  ?Component Value Date  ? WBC 8.6 09/03/2021  ? HGB 10.1 (L) 09/03/2021  ? HCT 32.6 (L) 09/03/2021  ? MCV 95.0 09/03/2021  ? PLT 154 09/03/2021  ?  ?Lab Results  ?Component Value Date  ? CREATININE 0.59 09/04/2021  ? BUN 7 (L) 09/04/2021  ? NA 139 09/04/2021  ? K 3.9 09/04/2021  ? CL 110 09/04/2021  ? CO2 25 09/04/2021  ?  ?Lab Results  ?Component Value Date  ? ALT 13 09/04/2021  ? AST 8 (L) 09/04/2021  ? ALKPHOS 60 09/04/2021  ? BILITOT 0.3 09/04/2021  ?  ? ?Microbiology: ?Recent Results (from the past 240 hour(s))  ?Resp Panel by RT-PCR (Flu A&B, Covid) Nasopharyngeal Swab     Status: None  ? Collection Time: 08/31/21  5:15 PM  ? Specimen: Nasopharyngeal Swab; Nasopharyngeal(NP) swabs in vial transport medium  ?Result Value Ref Range Status  ? SARS Coronavirus 2 by RT PCR NEGATIVE NEGATIVE Final  ?  Comment: (NOTE) ?SARS-CoV-2 target nucleic acids are NOT DETECTED. ? ?The SARS-CoV-2 RNA is generally detectable in upper respiratory ?specimens during the acute phase of infection. The lowest ?concentration of SARS-CoV-2 viral copies this assay can detect is ?138 copies/mL. A negative result does not preclude SARS-Cov-2 ?infection and should not be used as the sole basis for treatment or ?other patient management decisions. A negative result may occur with  ?improper specimen collection/handling, submission of specimen other ?than nasopharyngeal swab, presence of viral mutation(s) within the ?areas targeted by this assay, and inadequate number of viral ?copies(<138 copies/mL). A  negative result must be combined with ?clinical observations, patient history, and epidemiological ?information. The expected result is Negative. ? ?Fact Sheet for Patients:  ?EntrepreneurPulse.com.au ? ?Fact Sheet for Healthcare Providers:  ?IncredibleEmployment.be ? ?This test is no t yet approved or cleared by the [place] FDA and  ?has been authorized for detection and/or diagnosis of SARS-CoV-2 by ?FDA under an Emergency Use Authorization (EUA). This EUA will remain  ?in effect (meaning this test can  be used) for the duration of the ?COVID-19 declaration under Section 564(b)(1) of the Act, 21 ?U.S.C.section 360bbb-3(b)(1), unless the authorization is terminated  ?or revoked sooner.  ? ? ?  ? Influenza A by PCR NEGATIVE NEGATIVE Final  ? Influenza B by PCR NEGATIVE NEGATIVE Final  ?  Comment: (NOTE) ?The Xpert Xpress SARS-CoV-2/FLU/RSV plus assay is intended as an aid ?in the diagnosis of influenza from Nasopharyngeal swab specimens and ?should not be used as a sole basis for treatment. Nasal washings and ?aspirates are unacceptable for Xpert Xpress SARS-CoV-2/FLU/RSV ?testing. ? ?Fact Sheet for Patients: ?EntrepreneurPulse.com.au ? ?Fact Sheet for Healthcare Providers: ?IncredibleEmployment.be ? ?This test is not yet approved or cleared by the [place] FDA and ?has been authorized for detection and/or diagnosis of SARS-CoV-2 by ?FDA under an Emergency Use Authorization (EUA). This EUA will remain ?in effect (meaning this test can be used) for the duration of the ?COVID-19 declaration under Section 564(b)(1) of the Act, 21 U.S.C. ?section 360bbb-3(b)(1), unless the authorization is terminated or ?revoked. ? ?Performed at [facility], [address] ?[place], [place] [address] ?  ?Respiratory (~20 pathogens) panel by PCR     Status: None  ? Collection Time: 08/31/21  5:18 PM  ? Specimen: Nasopharyngeal Swab;  Respiratory  ?Result Value Ref Range Status  ? Adenovirus NOT DETECTED NOT DETECTED Final  ? Coronavirus 229E NOT DETECTED NOT DETECTED Final  ?  Comment: (NOTE) ?The Coronavirus on the Respiratory Panel, DOES

## 2021-09-05 DIAGNOSIS — G030 Nonpyogenic meningitis: Principal | ICD-10-CM

## 2021-09-05 DIAGNOSIS — R6510 Systemic inflammatory response syndrome (SIRS) of non-infectious origin without acute organ dysfunction: Secondary | ICD-10-CM | POA: Diagnosis not present

## 2021-09-05 LAB — CULTURE, GROUP A STREP (THRC)

## 2021-09-05 LAB — CULTURE, BLOOD (ROUTINE X 2)
Culture: NO GROWTH
Culture: NO GROWTH
Special Requests: ADEQUATE
Special Requests: ADEQUATE

## 2021-09-05 LAB — HSV 1/2 PCR, CSF
HSV-1 DNA: NEGATIVE
HSV-2 DNA: NEGATIVE

## 2021-09-05 LAB — BASIC METABOLIC PANEL
Anion gap: 6 (ref 5–15)
BUN: 9 mg/dL (ref 8–23)
CO2: 28 mmol/L (ref 22–32)
Calcium: 8.5 mg/dL — ABNORMAL LOW (ref 8.9–10.3)
Chloride: 105 mmol/L (ref 98–111)
Creatinine, Ser: 0.5 mg/dL (ref 0.44–1.00)
GFR, Estimated: >60 mL/min (ref >60)
Glucose, Bld: 95 mg/dL (ref 70–99)
Potassium: 4.1 mmol/L (ref 3.5–5.1)
Sodium: 139 mmol/L (ref 135–145)

## 2021-09-05 NOTE — Discharge Summary (Signed)
Physician Discharge Summary  ?[name] [id number]:[id number] DOB: 07/17/1945 DOA: 08/31/2021 ? ?PCP: Plotnikov, Evie Lacks, MD ? ?Admit date: 08/31/2021 ?Discharge date: 09/05/2021 ? ?Time spent: 30 minutes ? ?Recommendations for Outpatient Follow-up:  ?Needs Chem-12 CBC in about 1 week ?Recommend outpatient de-escalation off of some of her psychotropic meds-Vesicare in conjunction can probably also cause confusion ?Please de-escalate as able ? ?Discharge Diagnoses:  ?MAIN problem for hospitalization  ? ?Aseptic meningitis ? ?Please see below for itemized issues addressed in HOpsital- ?refer to other progress notes for clarity if needed ? ?Discharge Condition: Improved ? ?Diet recommendation: Regular ? ?Filed Weights  ? 08/31/21 1711 08/31/21 1953  ?Weight: 79.8 kg 81.6 kg  ? ? ?History of present illness:  ?76 wall white female community dwelling (lives alone) ?Prior SBO's 2/2 jejunojejunostomy intussusception back in 2018--patient underwent ex lap with LOA 01/30/2017 ?Barrett's esophagus and sees Dr. Henrene Pastor for severe constipation ?HTN ?migraine ?B12 deficiency prior diverticulosis prior knee surgeries ?She has sustained prior falls in January 2023 ?  ?Antecedent new h/o takes care of great grandchildren--5 y old had mild URI sympt--patient herself had sore throat cough ~ 4 days prior  to 3/26 ?  ?Presented to [facility] ED 08/31/2021 with confusion inability to talk ? ?ED work-up showed no abnormality on head CT, WBC was 16 ?Lumbar puncture performed in the ED showed rare gram-positive cocci no WBC glucose 65 total protein 27 ?  ?Both neurology and ID were consulted secondary to concerns for either stroke versus cryptogenic meningitis (patient had sinusitis in January 2023 and did not complete Rx) ? ?patient completed therapy as below ? ?Hospital Course:  ?Aseptic meningitis meningitis ?CSF shows only few cells with no white cells with NGTD since 3/16  ?Switched to Valtrex 1 g 3 times dailyand this was discontinued on day of  discharge  ?HSV PCR was negative and this was felt to either have been an aseptic meningitis on admission versus other \ ?Appreciate ID input and follow-up ?Recent sore throat ?Await strep throat testing morning-is on empiric coverage regardless- ?Patient felt better-patient should cut back on polypharmacy and we did not recommend Hycodan on discharge ?Anxiety/depression ?Resume Xanax 0.5 twice daily gabapentin at bedtime ?Elevated blood pressure  ?sinus tachycardia with trigeminy earlier in hospitalization ?Resumed Cardizem 240-resolved with the same ?Toxic metabolic encephalopathy ?Etiology infectious related however rare causes possible- ?Imaging, b12,tsh EEG is neg  ?Hold trazodone 50 for now-- she should follow-up with her regular psychiatrist or physician in the outpatient setting ?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 Henrene Pastor ?Mild hypokalemia ?Saline lock IV with K-repeat labs periodically ?Recent multiple falls in the outpatient setting in January ?Appreciate PT input --needs HH pt as an outpatient because of recent falls ? ?Consultations: ?Infectious disease ?Neurology ? ?Discharge Exam: ?Vitals:  ? 09/04/21 2002 09/05/21 0543  ?BP: (!) 155/66 137/64  ?Pulse: 76 81  ?Resp: 20 18  ?Temp: 97.9 ?F (36.6 ?C) 98 ?F (36.7 ?C)  ?SpO2: 98% 96%  ?Awake coherent brushing teeth when I saw her feels fine ?No distress ? ? ?Subj on day of d/c ?  ?I had a good discussion with the daughter and explained the plan of care yesterday ?She is ready to go home ? ?General Exam on discharge ? ?EOMI NCAT no focal deficit CTA B no rales rhonchi ?Abdomen soft no rebound no guarding ?ROM intact ?Moving all 4 limbs equally and ambulatory ? ?  Discharge Instructions ? ? ?Discharge Instructions   ? ? Diet - low sodium heart healthy   Complete by: As directed ?  ? Discharge instructions   Complete by:  As directed ?  ? This hospital visit you were diagnosed with possible meningitis versus effect of your meds-it sounds like you have completed several days of meningitis coverage and you do not need any further treatment for it-you have completed the standard duration ?Look in meds carefully somehow changed and we would prefer that you do not take to new meds that can cause you to be confused ?I would recommend that you follow-up in the outpatient setting with your regular physician for labs in about a week ?If you have high fevers chills nausea vomiting please let your physician know  ? Increase activity slowly   Complete by: As directed ?  ? ?  ? ?Allergies as of 09/05/2021   ? ?   Reactions  ? Levaquin [levofloxacin] Nausea Only  ? Omnicef [cefdinir] Nausea And Vomiting  ? diarrhea  ? ?  ? ?  ?Medication List  ?  ? ?STOP taking these medications   ? ?Align 4 MG Caps ?  ?cefdinir 300 MG capsule ?Commonly known as: OMNICEF ?  ?flurbiprofen 100 MG tablet ?Commonly known as: ANSAID ?  ?HYDROcodone bit-homatropine 5-1.5 MG/5ML syrup ?Commonly known as: HYCODAN ?  ?traZODone 50 MG tablet ?Commonly known as: DESYREL ?  ? ?  ? ?TAKE these medications   ? ?ALPRAZolam 0.5 MG tablet ?Commonly known as: Duanne Moron ?Take 1 tablet (0.5 mg total) by mouth 2 (two) times daily as needed. ?What changed:  ?how much to take ?reasons to take this ?  ?aspirin 81 MG tablet ?Take 81 mg by mouth at bedtime. ?  ?benzonatate 200 MG capsule ?Commonly known as: TESSALON ?Take 1 capsule (200 mg total) by mouth 3 (three) times daily as needed for cough. ?  ?buPROPion 150 MG 24 hr tablet ?Commonly known as: Wellbutrin XL ?Take 1 tablet (150 mg total) by mouth daily. ?  ?fluticasone 50 MCG/ACT nasal spray ?Commonly known as: FLONASE ?Place 2 sprays into both nostrils daily. ?  ?gabapentin 100 MG capsule ?Commonly known as: NEURONTIN ?Take 1 capsule (100 mg total) by mouth at bedtime. ?What changed: additional instructions ?  ?ibuprofen 600 MG  tablet ?Commonly known as: ADVIL ?TAKE 1 TAB BY MOUTH EVERY 8 HRS FOR HEADACHE OR MODERATE PAIN. TAKE 2X DAILY X 2WKS THEN AS NEEDED ?What changed: See the new instructions. ?  ?linaclotide 290 MCG Caps capsule ?Commonly known as: Linzess ?TAKE 1 CAPSULE BY MOUTH  DAILY BEFORE BREAKFAST ?What changed:  ?how much to take ?how to take this ?when to take this ?additional instructions ?  ?lovastatin 40 MG tablet ?Commonly known as: MEVACOR ?Take 1 tablet (40 mg total) by mouth at bedtime. Annual appt due W/LABS must see provider for future refills ?  ?OCUVITE PO ?Take 2 tablets by mouth daily. ?  ?omeprazole 40 MG capsule ?Commonly known as: PRILOSEC ?Take 1 capsule (40 mg total) by mouth in the morning and at bedtime. ?  ?ondansetron 4 MG tablet ?Commonly known as: Zofran ?Take 1 tablet (4 mg total) by mouth every 8 (eight) hours as needed for nausea or vomiting. ?  ?solifenacin 10 MG tablet ?Commonly known as: VESICARE ?TAKE 1 TABLET (10 MG TOTAL) BY MOUTH DAILY. ANNUAL APPT IS DUE MUST SEE PROVIDER FOR FUTURE REFILLS ?What changed:  ?how much to take ?when to take this ?reasons to  take this ?  ?verapamil 240 MG CR tablet ?Commonly known as: CALAN-SR ?Take 1 tablet (240 mg total) by mouth at bedtime. ?  ?vitamin B-12 1000 MCG tablet ?Commonly known as: CYANOCOBALAMIN ?Take 500 mcg by mouth daily. ?  ?Vitamin D 50 MCG (2000 UT) Caps ?Take 1 capsule by mouth daily. ?  ? ?  ? ?Allergies  ?Allergen Reactions  ? Levaquin [Levofloxacin] Nausea Only  ? Omnicef [Cefdinir] Nausea And Vomiting  ?  diarrhea  ? ? ? ? ?The results of significant diagnostics from this hospitalization (including imaging, microbiology, ancillary and laboratory) are listed below for reference.   ? ?Significant Diagnostic Studies: ?MR Brain W and Wo Contrast ? ?Result Date: 09/01/2021 ?CLINICAL DATA:  76 year old female code stroke presentation yesterday. Abnormal speech. EXAM: MRI HEAD WITHOUT AND WITH CONTRAST TECHNIQUE: Multiplanar, multiecho pulse  sequences of the brain and surrounding structures were obtained without and with intravenous contrast. CONTRAST:  42m GADAVIST GADOBUTROL 1 MMOL/ML IV SOLN COMPARISON:  Head CT 08/31/2021.  Brain MRI 05/06/2018. FIND

## 2021-09-06 ENCOUNTER — Other Ambulatory Visit: Payer: Self-pay | Admitting: Internal Medicine

## 2021-09-19 ENCOUNTER — Ambulatory Visit: Payer: Medicare PPO | Admitting: Internal Medicine

## 2021-09-19 ENCOUNTER — Encounter: Payer: Self-pay | Admitting: Internal Medicine

## 2021-09-19 DIAGNOSIS — N951 Menopausal and female climacteric states: Secondary | ICD-10-CM | POA: Insufficient documentation

## 2021-09-19 DIAGNOSIS — E559 Vitamin D deficiency, unspecified: Secondary | ICD-10-CM

## 2021-09-19 DIAGNOSIS — F4323 Adjustment disorder with mixed anxiety and depressed mood: Secondary | ICD-10-CM | POA: Diagnosis not present

## 2021-09-19 DIAGNOSIS — G030 Nonpyogenic meningitis: Secondary | ICD-10-CM

## 2021-09-19 DIAGNOSIS — R053 Chronic cough: Secondary | ICD-10-CM

## 2021-09-19 DIAGNOSIS — M797 Fibromyalgia: Secondary | ICD-10-CM | POA: Insufficient documentation

## 2021-09-19 DIAGNOSIS — E538 Deficiency of other specified B group vitamins: Secondary | ICD-10-CM | POA: Diagnosis not present

## 2021-09-19 DIAGNOSIS — B349 Viral infection, unspecified: Secondary | ICD-10-CM

## 2021-09-19 MED ORDER — HYDROCODONE BIT-HOMATROP MBR 5-1.5 MG/5ML PO SOLN: 5.0000 mL | Freq: Three times a day (TID) | ORAL | 0 refills | Status: DC | PRN

## 2021-09-19 NOTE — Assessment & Plan Note (Signed)
On Vit D 

## 2021-09-19 NOTE — Assessment & Plan Note (Signed)
Recurrent ?Worse ?Hycodan prn ? ?

## 2021-09-19 NOTE — Progress Notes (Signed)
Total total ? ?Subjective:  ?Patient ID: [name], female    DOB: 11/10/1945  Age: 77 y.o. MRN: [id number] ? ?CC: No chief complaint on file. ? ? ?HPI ?[name] presents for HAs, fatigue - better, confusion resolved ?F/u on aseptic meningitis ? ? ? ?"Admit date: 08/31/2021 ?Discharge date: 09/05/2021 ?  ?Time spent: 30 minutes ?  ?Recommendations for Outpatient Follow-up:  ?Needs Chem-12 CBC in about 1 week ?Recommend outpatient de-escalation off of some of her psychotropic meds-Vesicare in conjunction can probably also cause confusion ?Please de-escalate as able ?  ?Discharge Diagnoses:  ?MAIN problem for hospitalization  ?  ?Aseptic meningitis ?  ?Please see below for itemized issues addressed in HOpsital- ?refer to other progress notes for clarity if needed ?  ?Discharge Condition: Improved ?  ?Diet recommendation: Regular ?  ?    ?Filed Weights  ?  08/31/21 1711 08/31/21 1953  ?Weight: 79.8 kg 81.6 kg  ?  ?  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 Henrene Pastor for severe constipation ?HTN ?migraine ?B12 deficiency prior diverticulosis prior knee surgeries ?She has sustained prior falls in January 2023 ?  ?Antecedent new h/o takes care of great grandchildren--5 y old had mild URI sympt--patient herself had sore throat cough ~ 4 days prior  to 3/26 ?  ?Presented to [facility] ED 08/31/2021 with confusion inability to talk ? ?ED work-up showed no abnormality on head CT, WBC was 16 ?Lumbar puncture performed in the ED showed rare gram-positive cocci no WBC glucose 65 total protein 27 ?  ?Both neurology and ID were consulted secondary to concerns for either stroke versus cryptogenic meningitis (patient had sinusitis in January 2023 and did not complete Rx) ?  ?patient completed therapy as below ?  ?Hospital Course:  ?Aseptic meningitis  meningitis ?CSF shows only few cells with no white cells with NGTD since 3/16  ?Switched to Valtrex 1 g 3 times dailyand this was discontinued on day of discharge  ?HSV PCR was negative and this was felt to either have been an aseptic meningitis on admission versus other \ ?Appreciate ID input and follow-up ?Recent sore throat ?Await strep throat testing morning-is on empiric coverage regardless- ?Patient felt better-patient should cut back on polypharmacy and we did not recommend Hycodan on discharge ?Anxiety/depression ?Resume Xanax 0.5 twice daily gabapentin at bedtime ?Elevated blood pressure  ?sinus tachycardia with trigeminy earlier in hospitalization ?Resumed Cardizem 240-resolved with the same ?Toxic metabolic encephalopathy ?Etiology infectious related however rare causes possible- ?Imaging, b12,tsh EEG is neg  ?Hold trazodone 50 for now-- she should follow-up with her regular psychiatrist or physician in the outpatient setting ?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 Henrene Pastor ?Mild hypokalemia ?Saline lock IV with K-repeat labs periodically ?Recent multiple falls in the outpatient setting in January ?Appreciate PT input --needs HH pt as an outpatient because of recent falls ?  ?Consultations: ?Infectious disease ?Neurology" ?  ? ? ?Outpatient Medications Prior to Visit  ?Medication Sig Dispense Refill  ? ALPRAZolam (XANAX) 0.5 MG tablet Take 1 tablet (0.5 mg total) by mouth 2 (two) times daily as needed. (Patient taking differently: Take 0.25 mg by mouth 2 (two) times daily as needed for anxiety or sleep.) 60 tablet 3  ? aspirin 81 MG tablet  Take 81 mg by mouth at bedtime.    ? benzonatate (TESSALON) 200 MG capsule Take 1 capsule (200 mg total) by mouth 3 (three) times daily as needed for cough. 60 capsule 1  ? buPROPion (WELLBUTRIN XL) 150 MG 24 hr tablet TAKE 1 TABLET  BY MOUTH EVERY DAY 90 tablet 0  ? Cholecalciferol (VITAMIN D) 2000 UNITS CAPS Take 1 capsule by mouth daily.    ? fluticasone (FLONASE) 50 MCG/ACT nasal spray Place 2 sprays into both nostrils daily. 48 g 3  ? gabapentin (NEURONTIN) 100 MG capsule Take 1 capsule (100 mg total) by mouth at bedtime. (Patient taking differently: Take 100 mg by mouth at bedtime. for restless leg) 90 capsule 3  ? ibuprofen (ADVIL) 600 MG tablet TAKE 1 TAB BY MOUTH EVERY 8 HRS FOR HEADACHE OR MODERATE PAIN. TAKE 2X DAILY X 2WKS THEN AS NEEDED (Patient taking differently: Take 600 mg by mouth every 8 (eight) hours as needed for moderate pain.) 60 tablet 0  ? linaclotide (LINZESS) 290 MCG CAPS capsule TAKE 1 CAPSULE BY MOUTH  DAILY BEFORE BREAKFAST (Patient taking differently: Take 290 mcg by mouth every other day. before breakfast) 90 capsule 3  ? lovastatin (MEVACOR) 40 MG tablet Take 1 tablet (40 mg total) by mouth at bedtime. Annual appt due W/LABS must see provider for future refills 90 tablet 3  ? Multiple Vitamins-Minerals (OCUVITE PO) Take 2 tablets by mouth daily.    ? omeprazole (PRILOSEC) 40 MG capsule Take 1 capsule (40 mg total) by mouth in the morning and at bedtime. 180 capsule 3  ? ondansetron (ZOFRAN) 4 MG tablet Take 1 tablet (4 mg total) by mouth every 8 (eight) hours as needed for nausea or vomiting. 20 tablet 0  ? solifenacin (VESICARE) 10 MG tablet TAKE 1 TABLET (10 MG TOTAL) BY MOUTH DAILY. ANNUAL APPT IS DUE MUST SEE PROVIDER FOR FUTURE REFILLS (Patient taking differently: Take 5 mg by mouth daily as needed (for overactive bladder). Annual appt is due must see provider for future refills) 90 tablet 3  ? verapamil (CALAN-SR) 240 MG CR tablet Take 1 tablet (240 mg total) by mouth at bedtime. 90 tablet 3  ? vitamin B-12 (CYANOCOBALAMIN) 1000 MCG tablet Take 500 mcg by mouth daily.    ? ?No facility-administered medications prior to visit.  ? ? ?ROS: ?Review of Systems ? ?Objective:  ?BP 110/70 (BP Location: Left Arm,  Patient Position: Sitting, Cuff Size: Large)   Pulse 64   Temp 98.3 ?F (36.8 ?C) (Oral)   Ht '5\' 1"'$  (1.549 m)   Wt 171 lb (77.6 kg)   SpO2 97%   BMI 32.31 kg/m?  ? ?BP Readings from Last 3 Encounters:  ?09/19/21 110/70  ?09/05/21 137/64  ?08/04/21 130/78  ? ? ?Wt Readings from Last 3 Encounters:  ?09/19/21 171 lb (77.6 kg)  ?08/31/21 180 lb (81.6 kg)  ?08/04/21 176 lb (79.8 kg)  ? ? ?Physical Exam ? ?Lab Results  ?Component Value Date  ? WBC 7.7 10/05/2021  ? HGB 11.6 (L) 10/05/2021  ? HCT 35.3 (L) 10/05/2021  ? PLT 246.0 10/05/2021  ? GLUCOSE 89 10/05/2021  ? CHOL 141 08/31/2021  ? TRIG 54 08/31/2021  ? HDL 48 08/31/2021  ? LDLDIRECT 111.0 02/04/2019  ? [place] 82 08/31/2021  ? ALT 7 10/05/2021  ? AST 10 10/05/2021  ? NA 139 10/05/2021  ? K 3.9 10/05/2021  ? CL 105 10/05/2021  ? CREATININE 0.81 10/05/2021  ? BUN 13 10/05/2021  ?  CO2 28 10/05/2021  ? TSH 0.504 08/31/2021  ? INR 1.1 08/31/2021  ? HGBA1C 5.6 08/31/2021  ? ? ?MR Brain W and Wo Contrast ? ?Result Date: 09/01/2021 ?CLINICAL DATA:  76 year old female code stroke presentation yesterday. Abnormal speech. EXAM: MRI HEAD WITHOUT AND WITH CONTRAST TECHNIQUE: Multiplanar, multiecho pulse sequences of the brain and surrounding structures were obtained without and with intravenous contrast. CONTRAST:  5m GADAVIST GADOBUTROL 1 MMOL/ML IV SOLN COMPARISON:  Head CT 08/31/2021.  Brain MRI 05/06/2018. FINDINGS: Study is mildly degraded by motion artifact despite repeated imaging attempts. Brain: No restricted diffusion to suggest acute infarction. No midline shift, mass effect, evidence of mass lesion, ventriculomegaly, extra-axial collection or acute intracranial hemorrhage. Cervicomedullary junction and pituitary are within normal limits. Progressed since 2019 and now moderately advanced patchy T2 and FLAIR hyperintensity scattered in the bilateral cerebral white matter, especially the corona radiata, and involving some of the anterior deep white matter  capsules on the left. Chronic T2 heterogeneity in the left caudate. No cortical encephalomalacia or chronic cerebral blood products identified. Possible chronic lacunar infarct dorsal left pons. No abnormal enhancement ide

## 2021-09-19 NOTE — Assessment & Plan Note (Signed)
Resolved

## 2021-09-19 NOTE — Assessment & Plan Note (Signed)
On Wellbutrin. 11/19 Hold Wellbutrin due to HAs - re-started ?Xanax prn ? Potential benefits of a long term benzodiazepines  use as well as potential risks  and complications were explained to the patient and were aknowledged. ?

## 2021-09-19 NOTE — Assessment & Plan Note (Signed)
Recent aseptic meningitis ?Sx's resolved ?

## 2021-10-05 ENCOUNTER — Other Ambulatory Visit (INDEPENDENT_AMBULATORY_CARE_PROVIDER_SITE_OTHER): Payer: Medicare PPO

## 2021-10-05 DIAGNOSIS — E559 Vitamin D deficiency, unspecified: Secondary | ICD-10-CM | POA: Diagnosis not present

## 2021-10-05 DIAGNOSIS — G030 Nonpyogenic meningitis: Secondary | ICD-10-CM

## 2021-10-05 LAB — CBC WITH DIFFERENTIAL/PLATELET
Basophils Absolute: 0.1 10*3/uL (ref 0.0–0.1)
Basophils Relative: 0.9 % (ref 0.0–3.0)
Eosinophils Absolute: 0.2 10*3/uL (ref 0.0–0.7)
Eosinophils Relative: 2.6 % (ref 0.0–5.0)
HCT: 35.3 % — ABNORMAL LOW (ref 36.0–46.0)
Hemoglobin: 11.6 g/dL — ABNORMAL LOW (ref 12.0–15.0)
Lymphocytes Relative: 28.4 % (ref 12.0–46.0)
Lymphs Abs: 2.2 10*3/uL (ref 0.7–4.0)
MCHC: 32.8 g/dL (ref 30.0–36.0)
MCV: 89.9 fl (ref 78.0–100.0)
Monocytes Absolute: 0.4 10*3/uL (ref 0.1–1.0)
Monocytes Relative: 5.6 % (ref 3.0–12.0)
Neutro Abs: 4.8 10*3/uL (ref 1.4–7.7)
Neutrophils Relative %: 62.5 % (ref 43.0–77.0)
Platelets: 246 10*3/uL (ref 150.0–400.0)
RBC: 3.92 Mil/uL (ref 3.87–5.11)
RDW: 15.5 % (ref 11.5–15.5)
WBC: 7.7 10*3/uL (ref 4.0–10.5)

## 2021-10-05 LAB — COMPREHENSIVE METABOLIC PANEL
ALT: 7 U/L (ref 0–35)
AST: 10 U/L (ref 0–37)
Albumin: 3.9 g/dL (ref 3.5–5.2)
Alkaline Phosphatase: 66 U/L (ref 39–117)
BUN: 13 mg/dL (ref 6–23)
CO2: 28 mEq/L (ref 19–32)
Calcium: 8.8 mg/dL (ref 8.4–10.5)
Chloride: 105 mEq/L (ref 96–112)
Creatinine, Ser: 0.81 mg/dL (ref 0.40–1.20)
GFR: 70.74 mL/min (ref >60.00)
Glucose, Bld: 89 mg/dL (ref 70–99)
Potassium: 3.9 mEq/L (ref 3.5–5.1)
Sodium: 139 mEq/L (ref 135–145)
Total Bilirubin: 0.6 mg/dL (ref 0.2–1.2)
Total Protein: 6.3 g/dL (ref 6.0–8.3)

## 2021-10-15 ENCOUNTER — Encounter: Payer: Self-pay | Admitting: Internal Medicine

## 2021-10-17 DIAGNOSIS — Z96651 Presence of right artificial knee joint: Secondary | ICD-10-CM | POA: Diagnosis not present

## 2021-10-17 DIAGNOSIS — T8484XA Pain due to internal orthopedic prosthetic devices, implants and grafts, initial encounter: Secondary | ICD-10-CM | POA: Diagnosis not present

## 2021-10-23 ENCOUNTER — Other Ambulatory Visit: Payer: Self-pay | Admitting: *Deleted

## 2021-10-23 NOTE — Telephone Encounter (Signed)
Called pt no answer LMOM MD completed handicapped form and surgery form for DR Lucey. Will leave both forms upfront for p/u..lmb ?

## 2021-11-02 ENCOUNTER — Ambulatory Visit: Payer: Medicare PPO | Admitting: Internal Medicine

## 2021-11-07 ENCOUNTER — Encounter: Payer: Self-pay | Admitting: Internal Medicine

## 2021-11-07 ENCOUNTER — Ambulatory Visit: Payer: Medicare PPO | Admitting: Internal Medicine

## 2021-11-07 DIAGNOSIS — E538 Deficiency of other specified B group vitamins: Secondary | ICD-10-CM | POA: Diagnosis not present

## 2021-11-07 DIAGNOSIS — F4323 Adjustment disorder with mixed anxiety and depressed mood: Secondary | ICD-10-CM | POA: Diagnosis not present

## 2021-11-07 DIAGNOSIS — G8929 Other chronic pain: Secondary | ICD-10-CM | POA: Diagnosis not present

## 2021-11-07 DIAGNOSIS — G4489 Other headache syndrome: Secondary | ICD-10-CM | POA: Diagnosis not present

## 2021-11-07 DIAGNOSIS — F09 Unspecified mental disorder due to known physiological condition: Secondary | ICD-10-CM | POA: Diagnosis not present

## 2021-11-07 DIAGNOSIS — M25561 Pain in right knee: Secondary | ICD-10-CM | POA: Diagnosis not present

## 2021-11-07 DIAGNOSIS — I10 Essential (primary) hypertension: Secondary | ICD-10-CM

## 2021-11-07 MED ORDER — HYDROCODONE-ACETAMINOPHEN 5-325 MG PO TABS: 1.0000 | ORAL_TABLET | Freq: Four times a day (QID) | ORAL | 0 refills | Status: DC | PRN

## 2021-11-07 NOTE — Assessment & Plan Note (Signed)
Cont on Xanax prn °Potential benefits of a long term benzodiazepines  use as well as potential risks  and complications were explained to the patient and were aknowledged. °

## 2021-11-07 NOTE — Assessment & Plan Note (Signed)
Doing well 

## 2021-11-07 NOTE — Assessment & Plan Note (Signed)
Cont on Verapamil

## 2021-11-07 NOTE — Assessment & Plan Note (Signed)
Resolving. Occ HAs - mild. Tylenol prn

## 2021-11-07 NOTE — Assessment & Plan Note (Addendum)
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

## 2021-11-07 NOTE — Assessment & Plan Note (Signed)
Wt Readings from Last 3 Encounters:  11/07/21 171 lb (77.6 kg)  09/19/21 171 lb (77.6 kg)  08/31/21 180 lb (81.6 kg)

## 2021-11-07 NOTE — Assessment & Plan Note (Signed)
On B12 

## 2021-11-07 NOTE — Progress Notes (Signed)
Subjective:  Patient ID: [name], female    DOB: [birth year]  Age: 76 y.o. MRN: [id number]  CC: Follow-up   HPI [name] presents for anxiety, dyslipidemia, HTN C/o R knee OA and pain. Grieving...  Outpatient Medications Prior to Visit  Medication Sig Dispense Refill   ALPRAZolam (XANAX) 0.5 MG tablet Take 1 tablet (0.5 mg total) by mouth 2 (two) times daily as needed. (Patient taking differently: Take 0.25 mg by mouth 2 (two) times daily as needed for anxiety or sleep.) 60 tablet 3   aspirin 81 MG tablet Take 81 mg by mouth at bedtime.     buPROPion (WELLBUTRIN XL) 150 MG 24 hr tablet TAKE 1 TABLET BY MOUTH EVERY DAY 90 tablet 0   Cholecalciferol (VITAMIN D) 2000 UNITS CAPS Take 1 capsule by mouth daily.     fluticasone (FLONASE) 50 MCG/ACT nasal spray Place 2 sprays into both nostrils daily. 48 g 3   gabapentin (NEURONTIN) 100 MG capsule Take 1 capsule (100 mg total) by mouth at bedtime. (Patient taking differently: Take 100 mg by mouth at bedtime. for restless leg) 90 capsule 3   ibuprofen (ADVIL) 600 MG tablet TAKE 1 TAB BY MOUTH EVERY 8 HRS FOR HEADACHE OR MODERATE PAIN. TAKE 2X DAILY X 2WKS THEN AS NEEDED (Patient taking differently: Take 600 mg by mouth every 8 (eight) hours as needed for moderate pain.) 60 tablet 0   linaclotide (LINZESS) 290 MCG CAPS capsule TAKE 1 CAPSULE BY MOUTH  DAILY BEFORE BREAKFAST (Patient taking differently: Take 290 mcg by mouth every other day. before breakfast) 90 capsule 3   lovastatin (MEVACOR) 40 MG tablet Take 1 tablet (40 mg total) by mouth at bedtime. Annual appt due W/LABS must see provider for future refills 90 tablet 3   Multiple Vitamins-Minerals (OCUVITE PO) Take 2 tablets by mouth daily.     omeprazole (PRILOSEC) 40 MG capsule Take 1 capsule (40 mg total) by mouth in the morning and at bedtime. 180 capsule 3   ondansetron (ZOFRAN) 4 MG tablet Take 1 tablet (4 mg total) by mouth every 8 (eight) hours as needed for nausea or vomiting.  20 tablet 0   solifenacin (VESICARE) 10 MG tablet TAKE 1 TABLET (10 MG TOTAL) BY MOUTH DAILY. ANNUAL APPT IS DUE MUST SEE PROVIDER FOR FUTURE REFILLS (Patient taking differently: Take 5 mg by mouth daily as needed (for overactive bladder). Annual appt is due must see provider for future refills) 90 tablet 3   verapamil (CALAN-SR) 240 MG CR tablet Take 1 tablet (240 mg total) by mouth at bedtime. 90 tablet 3   vitamin B-12 (CYANOCOBALAMIN) 1000 MCG tablet Take 500 mcg by mouth daily.     HYDROcodone bit-homatropine (HYCODAN) 5-1.5 MG/5ML syrup Take 5 mLs by mouth every 8 (eight) hours as needed for cough. 240 mL 0   benzonatate (TESSALON) 200 MG capsule Take 1 capsule (200 mg total) by mouth 3 (three) times daily as needed for cough. (Patient not taking: Reported on 11/07/2021) 60 capsule 1   No facility-administered medications prior to visit.    ROS: Review of Systems  Constitutional:  Negative for activity change, appetite change, chills, fatigue and unexpected weight change.  HENT:  Negative for congestion, mouth sores and sinus pressure.   Eyes:  Negative for visual disturbance.  Respiratory:  Positive for cough. Negative for chest tightness.   Gastrointestinal:  Negative for abdominal pain and nausea.  Genitourinary:  Negative for difficulty urinating, frequency and vaginal pain.  Musculoskeletal:  Negative for back pain and gait problem.  Skin:  Negative for pallor and rash.  Neurological:  Positive for headaches. Negative for dizziness, tremors, weakness and numbness.  Psychiatric/Behavioral:  Negative for confusion and sleep disturbance.    Objective:  BP 112/74 (BP Location: Left Arm, Patient Position: Sitting, Cuff Size: Normal)   Pulse 74   Temp 98.3 F (36.8 C) (Oral)   Ht '5\' 1"'$  (1.549 m)   Wt 171 lb (77.6 kg)   SpO2 95%   BMI 32.31 kg/m   BP Readings from Last 3 Encounters:  11/07/21 112/74  09/19/21 110/70  09/05/21 137/64    Wt Readings from Last 3 Encounters:   11/07/21 171 lb (77.6 kg)  09/19/21 171 lb (77.6 kg)  08/31/21 180 lb (81.6 kg)    Physical Exam Constitutional:      General: She is not in acute distress.    Appearance: She is well-developed. She is obese.  HENT:     Head: Normocephalic.     Right Ear: External ear normal.     Left Ear: External ear normal.     Nose: Nose normal.  Eyes:     General:        Right eye: No discharge.        Left eye: No discharge.     Conjunctiva/sclera: Conjunctivae normal.     Pupils: Pupils are equal, round, and reactive to light.  Neck:     Thyroid: No thyromegaly.     Vascular: No JVD.     Trachea: No tracheal deviation.  Cardiovascular:     Rate and Rhythm: Normal rate and regular rhythm.     Heart sounds: Normal heart sounds.  Pulmonary:     Effort: No respiratory distress.     Breath sounds: No stridor. No wheezing.  Abdominal:     General: Bowel sounds are normal. There is no distension.     Palpations: Abdomen is soft. There is no mass.     Tenderness: There is no abdominal tenderness. There is no guarding or rebound.  Musculoskeletal:        General: No tenderness.     Cervical back: Normal range of motion and neck supple. No rigidity.  Lymphadenopathy:     Cervical: No cervical adenopathy.  Skin:    Findings: No erythema or rash.  Neurological:     Cranial Nerves: No cranial nerve deficit.     Motor: No abnormal muscle tone.     Coordination: Coordination normal.     Deep Tendon Reflexes: Reflexes normal.  Psychiatric:        Behavior: Behavior normal.        Thought Content: Thought content normal.        Judgment: Judgment normal.  R knee w/pain  Lab Results  Component Value Date   WBC 7.7 10/05/2021   HGB 11.6 (L) 10/05/2021   HCT 35.3 (L) 10/05/2021   PLT 246.0 10/05/2021   GLUCOSE 89 10/05/2021   CHOL 141 08/31/2021   TRIG 54 08/31/2021   HDL 48 08/31/2021   LDLDIRECT 111.0 02/04/2019   LDLCALC 82 08/31/2021   ALT 7 10/05/2021   AST 10 10/05/2021    NA 139 10/05/2021   K 3.9 10/05/2021   CL 105 10/05/2021   CREATININE 0.81 10/05/2021   BUN 13 10/05/2021   CO2 28 10/05/2021   TSH 0.504 08/31/2021   INR 1.1 08/31/2021   HGBA1C 5.6 08/31/2021    MR Brain W and  Wo Contrast  Result Date: 09/01/2021 CLINICAL DATA:  76 year old female code stroke presentation yesterday. Abnormal speech. EXAM: MRI HEAD WITHOUT AND WITH CONTRAST TECHNIQUE: Multiplanar, multiecho pulse sequences of the brain and surrounding structures were obtained without and with intravenous contrast. CONTRAST:  5m GADAVIST GADOBUTROL 1 MMOL/ML IV SOLN COMPARISON:  Head CT 08/31/2021.  Brain MRI 05/06/2018. FINDINGS: Study is mildly degraded by motion artifact despite repeated imaging attempts. Brain: No restricted diffusion to suggest acute infarction. No midline shift, mass effect, evidence of mass lesion, ventriculomegaly, extra-axial collection or acute intracranial hemorrhage. Cervicomedullary junction and pituitary are within normal limits. Progressed since 2019 and now moderately advanced patchy T2 and FLAIR hyperintensity scattered in the bilateral cerebral white matter, especially the corona radiata, and involving some of the anterior deep white matter capsules on the left. Chronic T2 heterogeneity in the left caudate. No cortical encephalomalacia or chronic cerebral blood products identified. Possible chronic lacunar infarct dorsal left pons. No abnormal enhancement identified.  No dural thickening. Vascular: Major intracranial vascular flow voids are grossly stable. Skull and upper cervical spine: Grossly stable visible cervical spine. Visualized bone marrow signal is within normal limits. Sinuses/Orbits: Stable allowing for motion. Other: Mild left greater than right mastoid effusions are new or increased since 2019. And there is small volume retained secretions in the nasopharynx. IMPRESSION: 1. Mildly motion degraded exam with no acute intracranial abnormality identified. 2.  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 Electronically Signed   By: HGenevie AnnM.D.   On: 09/01/2021 11:41   EEG adult  Result Date: 09/01/2021 YLora Havens MD     09/01/2021  6:27 PM Patient Name: LKRISHA BEEGLEMRN: [id number] Attending: PLora HavensReferring Physician/Provider: TDomenic Moras PA-C Date: 09/02/2019 Duration: 22.10 mins Patient history: 89yoF with ams. EEG to evaluate for seizure Level of alertness: Awake AEDs during EEG study: None Technical aspects: This EEG study was done with scalp electrodes positioned according to the 10-20 International system of electrode placement. Electrical activity was acquired at a sampling rate of '500Hz'$  and reviewed with a high frequency filter of '70Hz'$  and a low frequency filter of '1Hz'$ . EEG data were recorded continuously and digitally stored. Description: No clear posterior dominant rhythm was seen. EEG showed continuous generalized polymorphic 6-'9hz'$  Hz theta-alpha activity admixed with intermittent generalized 2-'3Hz'$  delta slowing, at times with triphasic morphology. Hyperventilation and photic stimulation were not performed.   ABNORMALITY - Continuous slow, generalized IMPRESSION: This study is suggestive of moderate diffuse encephalopathy, nonspecific etiology. No seizures or epileptiform discharges were seen throughout the recording. PLora Havens  ECHOCARDIOGRAM COMPLETE  Result Date: 09/01/2021    ECHOCARDIOGRAM REPORT   Patient Name:   LSUNDEEP CARYDate of Exam: 09/01/2021 Medical Rec #:  [id number]    Height:       61.0 in Accession #:    [id number]   Weight:       180.0 lb Date of Birth:  5Nov 20, 1947     BSA:          1.806 m Patient Age:    89years      BP:           124/58 mmHg Patient Gender: F             HR:           100 bpm. Exam Location:  Inpatient  Procedure: 2D Echo Indications:    Fever  History:        Patient has prior  history of Echocardiogram examinations, most                 recent 10/05/2011. Risk Factors:Dyslipidemia and Hypertension.  Sonographer:    Arlyss Gandy Referring Phys: [place]  Sonographer Comments: Image acquisition challenging due to respiratory motion and supine. IMPRESSIONS  1. Left ventricular ejection fraction, by estimation, is 60 to 65%. The left ventricle has normal function. The left ventricle has no regional wall motion abnormalities. Left ventricular diastolic parameters are consistent with Grade I diastolic dysfunction (impaired relaxation).  2. Right ventricular systolic function is normal. The right ventricular size is normal. There is mildly elevated pulmonary artery systolic pressure. The estimated right ventricular systolic pressure is 51.8 mmHg.  3. The mitral valve is normal in structure. Trivial mitral valve regurgitation. No evidence of mitral stenosis.  4. The aortic valve is tricuspid. Aortic valve regurgitation is not visualized. No aortic stenosis is present.  5. The inferior vena cava is normal in size with greater than 50% respiratory variability, suggesting right atrial pressure of 3 mmHg.  6. The patient was in ventricular bigeminy. FINDINGS  Left Ventricle: Left ventricular ejection fraction, by estimation, is 60 to 65%. The left ventricle has normal function. The left ventricle has no regional wall motion abnormalities. The left ventricular internal cavity size was normal in size. There is  no left ventricular hypertrophy. Left ventricular diastolic parameters are consistent with Grade I diastolic dysfunction (impaired relaxation). Right Ventricle: The right ventricular size is normal. No increase in right ventricular wall thickness. Right ventricular systolic function is normal. There is mildly elevated pulmonary artery systolic pressure. The tricuspid regurgitant velocity is 2.94  m/s, and with an assumed right atrial pressure of 3 mmHg, the estimated right  ventricular systolic pressure is 84.1 mmHg. Left Atrium: Left atrial size was normal in size. Right Atrium: Right atrial size was normal in size. Pericardium: There is no evidence of pericardial effusion. Mitral Valve: The mitral valve is normal in structure. Trivial mitral valve regurgitation. No evidence of mitral valve stenosis. Tricuspid Valve: The tricuspid valve is normal in structure. Tricuspid valve regurgitation is trivial. Aortic Valve: The aortic valve is tricuspid. Aortic valve regurgitation is not visualized. No aortic stenosis is present. Aortic valve mean gradient measures 7.0 mmHg. Aortic valve peak gradient measures 12.2 mmHg. Aortic valve area, by VTI measures 2.63  cm. Pulmonic Valve: The pulmonic valve was normal in structure. Pulmonic valve regurgitation is not visualized. Aorta: The aortic root is normal in size and structure. Venous: The inferior vena cava is normal in size with greater than 50% respiratory variability, suggesting right atrial pressure of 3 mmHg. IAS/Shunts: No atrial level shunt detected by color flow Doppler.  LEFT VENTRICLE PLAX 2D LVIDd:         4.50 cm   Diastology LVIDs:         3.10 cm   LV e' medial:    8.05 cm/s LV PW:         1.00 cm   LV E/e' medial:  10.8 LV IVS:        1.00 cm   LV e' lateral:   11.00 cm/s LVOT diam:     2.10 cm   LV E/e' lateral: 7.9 LV SV:         86 LV SV Index:   48 LVOT Area:  3.46 cm  RIGHT VENTRICLE             IVC RV Basal diam:  3.00 cm     IVC diam: 1.60 cm RV Mid diam:    3.20 cm RV S prime:     15.00 cm/s TAPSE (M-mode): 2.0 cm LEFT ATRIUM           Index        RIGHT ATRIUM           Index LA diam:      3.60 cm 1.99 cm/m   RA Area:     12.90 cm LA Vol (A4C): 44.6 ml 24.69 ml/m  RA Volume:   28.00 ml  15.50 ml/m  AORTIC VALVE AV Area (Vmax):    2.45 cm AV Area (Vmean):   2.20 cm AV Area (VTI):     2.63 cm AV Vmax:           175.00 cm/s AV Vmean:          126.000 cm/s AV VTI:            0.326 m AV Peak Grad:      12.2 mmHg  AV Mean Grad:      7.0 mmHg LVOT Vmax:         124.00 cm/s LVOT Vmean:        80.200 cm/s LVOT VTI:          0.248 m LVOT/AV VTI ratio: 0.76  AORTA Ao Root diam: 3.00 cm Ao Asc diam:  2.80 cm MITRAL VALVE                TRICUSPID VALVE MV Area (PHT): 4.21 cm     TR Peak grad:   34.6 mmHg MV Decel Time: 180 msec     TR Vmax:        294.00 cm/s MV E velocity: 87.00 cm/s MV A velocity: 107.00 cm/s  SHUNTS MV E/A ratio:  0.81         Systemic VTI:  0.25 m                             Systemic Diam: 2.10 cm Dalton McleanMD Electronically signed by Franki Monte Signature Date/Time: 09/01/2021/4:46:16 PM    Final     Assessment & Plan:   Problem List Items Addressed This Visit     B12 deficiency    On B12       Morbid obesity ([place])    Wt Readings from Last 3 Encounters:  11/07/21 171 lb (77.6 kg)  09/19/21 171 lb (77.6 kg)  08/31/21 180 lb (81.6 kg)        Adjustment disorder with mixed anxiety and depressed mood    Cont on Xanax prn  Potential benefits of a long term benzodiazepines  use as well as potential risks  and complications were explained to the patient and were aknowledged.       Essential hypertension    Cont on Verapamil      Mild cognitive disorder    Doing well       Headache    Resolving. Occ HAs - mild. Tylenol prn       Relevant Medications   HYDROcodone-acetaminophen (NORCO/VICODIN) 5-325 MG tablet   Knee pain, right    OA R TKR is pending this summer Blue-Emu cream was recommended to use 2-3 times a day  Meds ordered this encounter  Medications   HYDROcodone-acetaminophen (NORCO/VICODIN) 5-325 MG tablet    Sig: Take 1 tablet by mouth every 6 (six) hours as needed for severe pain.    Dispense:  20 tablet    Refill:  0      Follow-up: Return in about 3 months (around 02/07/2022) for a follow-up visit.  Walker Kehr, MD

## 2021-12-10 IMAGING — DX DG CHEST 2V
2 series · 2 of 2 positions shown · non-contrast
Comparison: CT Abdomen and Pelvis dated 02/07/2017. Radiography of
the Chest dated 07/13/2016.

CLINICAL DATA: Patient complains of a productive cough x 3 weeks,
but has been worse for the past 4-5 days. She does have a history of
hypertension and COVID.

EXAM:
CHEST - 2 VIEW

[chest pa]
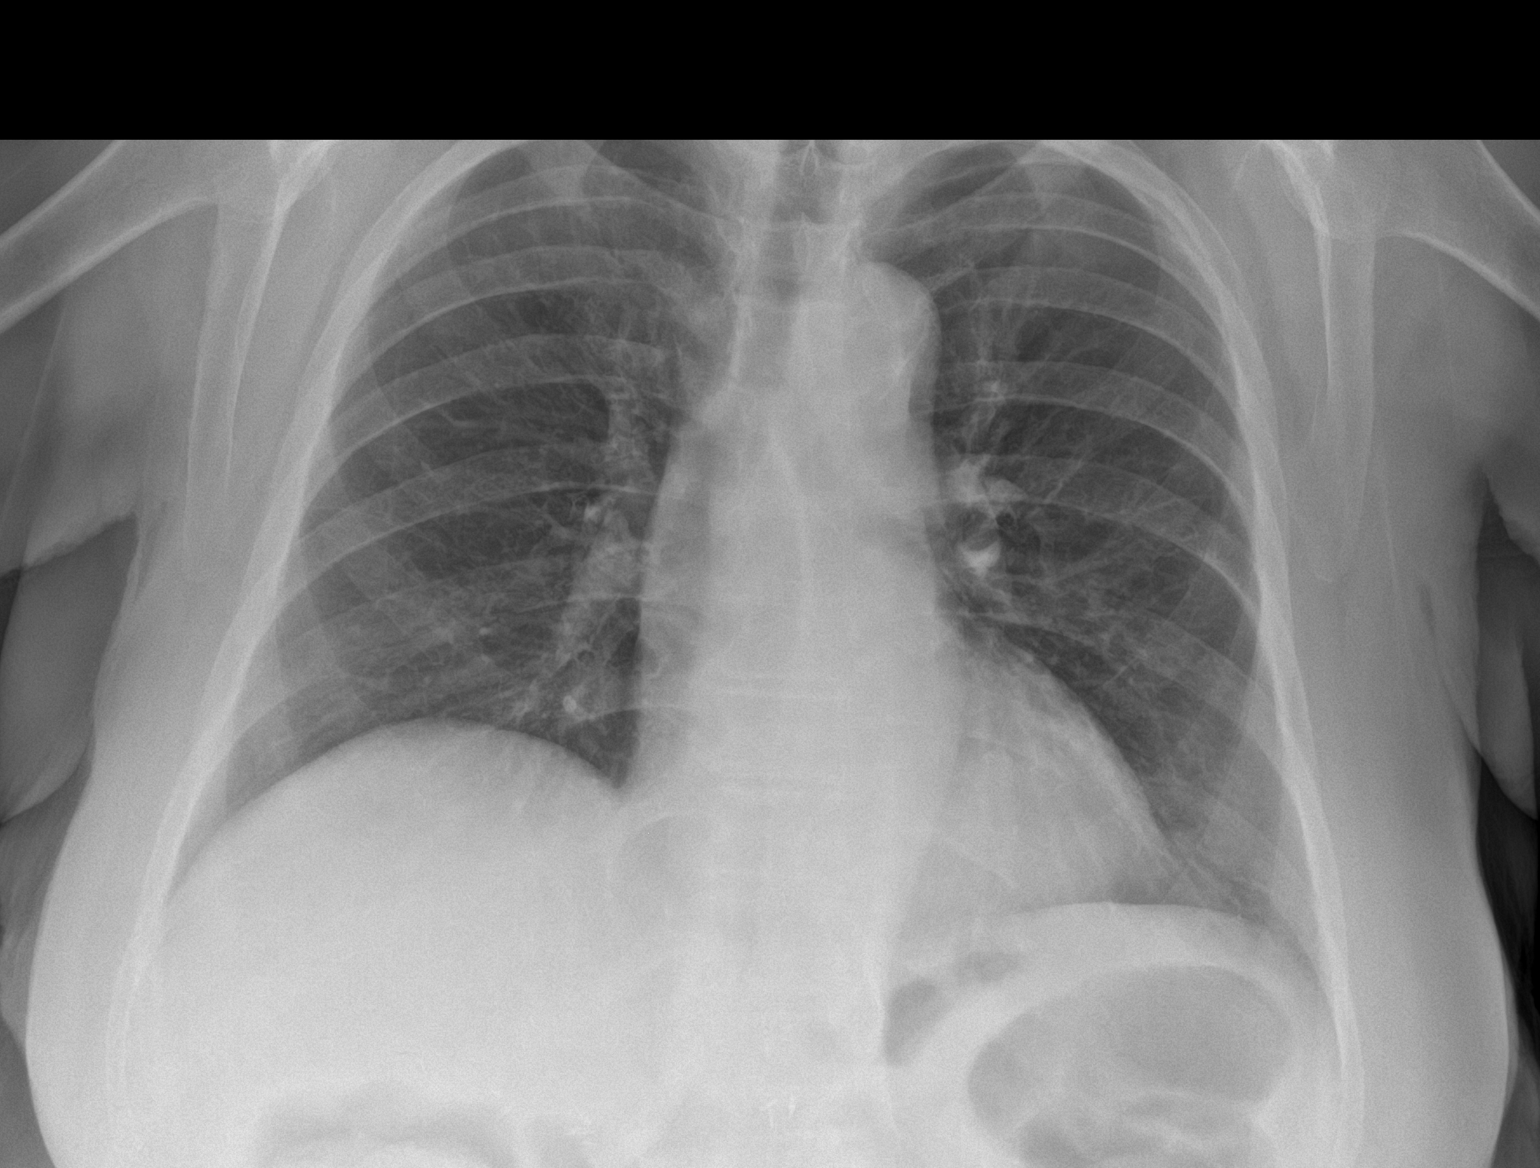

[chest lat]
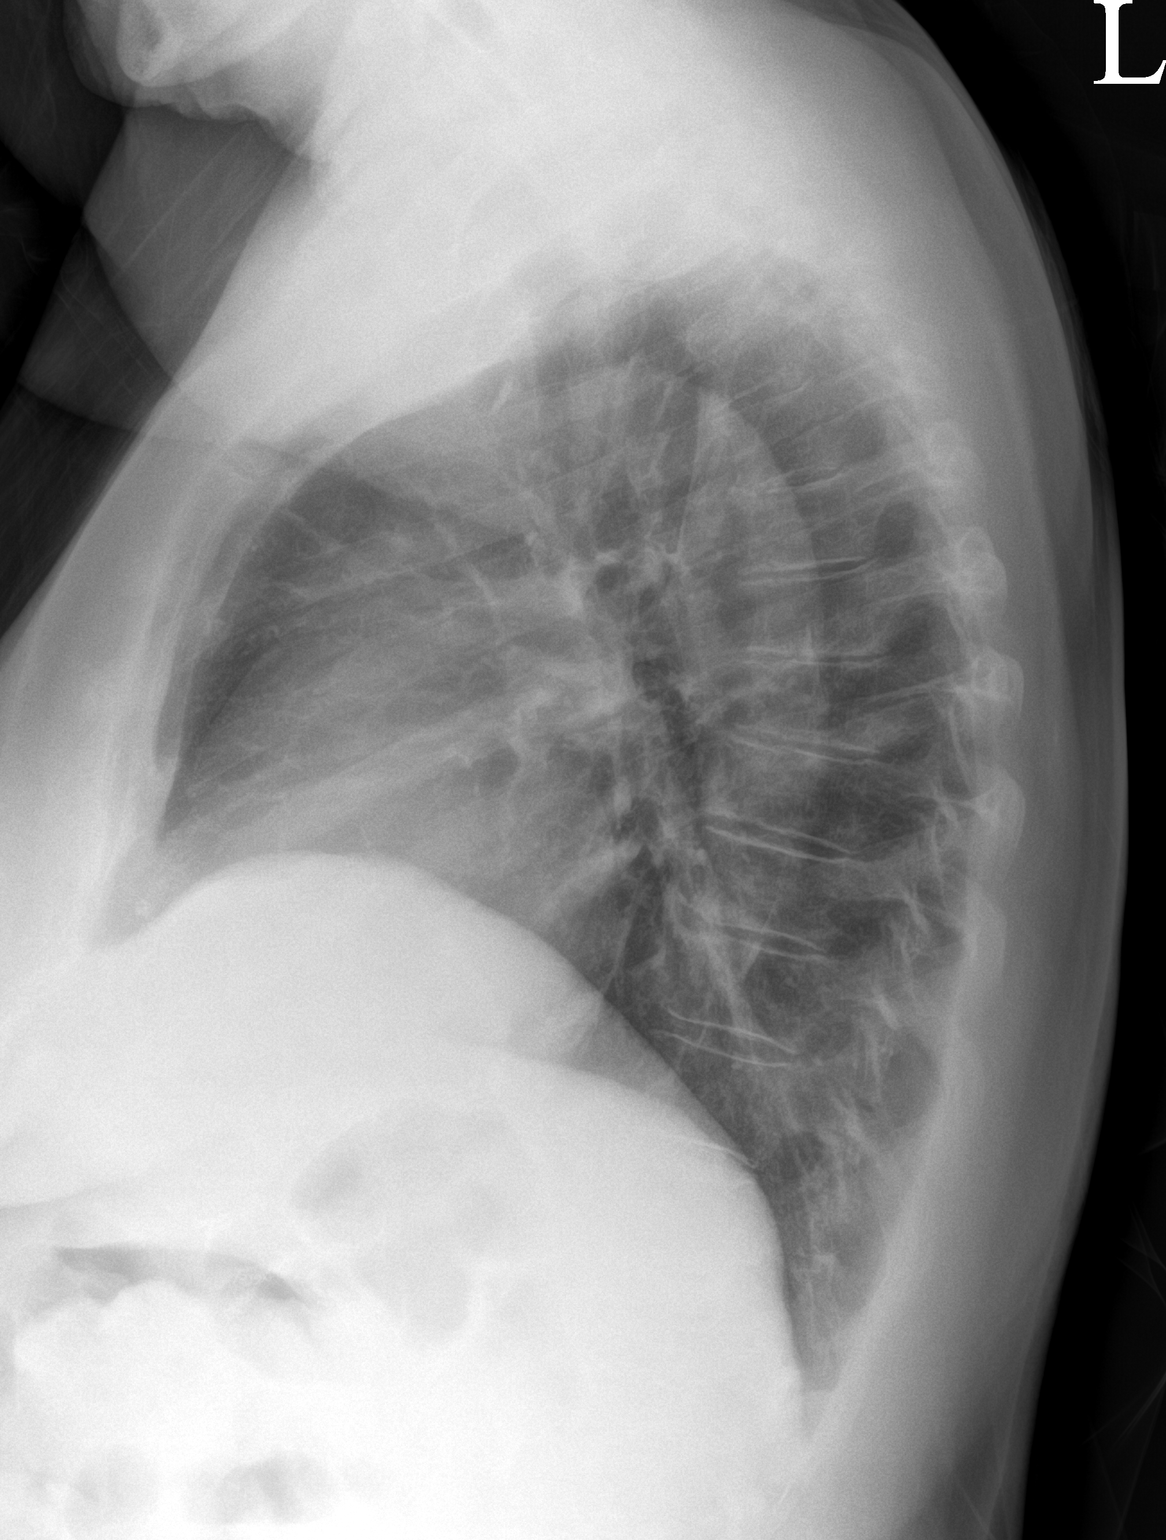

[2 of 2 positions shown; findings below may reference images not displayed]

FINDINGS: The heart size and mediastinal contours are within normal limits.
Both lungs are clear. The visualized skeletal structures are
unremarkable.
IMPRESSION: No active cardiopulmonary disease.

## 2021-12-15 ENCOUNTER — Other Ambulatory Visit: Payer: Self-pay | Admitting: Internal Medicine

## 2021-12-16 ENCOUNTER — Telehealth: Payer: Medicare PPO | Admitting: Emergency Medicine

## 2021-12-16 DIAGNOSIS — J029 Acute pharyngitis, unspecified: Secondary | ICD-10-CM | POA: Diagnosis not present

## 2021-12-16 NOTE — Progress Notes (Signed)
E-Visit for Sore Throat  We are sorry that you are not feeling well.  Here is how we plan to help!  Your symptoms indicate a likely viral infection (Pharyngitis).   Pharyngitis is inflammation in the back of the throat which can cause a sore throat, scratchiness and sometimes difficulty swallowing.   Pharyngitis is typically caused by a respiratory virus and will just run its course.  Please keep in mind that your symptoms could last up to 10 days.  For throat pain, we recommend over the counter oral pain relief medications such as acetaminophen or aspirin, or anti-inflammatory medications such as ibuprofen or naproxen sodium.  Topical treatments such as oral throat lozenges or sprays may be used as needed.  Avoid close contact with loved ones, especially the very young and elderly.  Remember to wash your hands thoroughly throughout the day as this is the number one way to prevent the spread of infection and wipe down door knobs and counters with disinfectant.  For nasal congestion, you may use plain Mucinex.    Saline nasal spray or nasal drops can help and can safely be used as often as needed for congestion.  Try using saline irrigation, such as with a neti pot, several times a day while you are sick. Many neti pots come with salt packets premeasured to use to make saline. If you use your own salt, make sure it is kosher salt or sea salt (don't use table salt as it has iodine in it and you don't need that in your nose). Use distilled water to make saline. If you mix your own saline using your own salt, the recipe is 1/4 teaspoon salt in 1 cup warm water. Using saline irrigation can help prevent and treat sinus infections and cold infections.   After careful review of your answers, I would not recommend an antibiotic for your condition.  Antibiotics should not be used to treat conditions that we suspect are caused by viruses like the virus that causes the common cold or flu. However, some people can  have Strep with atypical symptoms. You may need formal testing in clinic or office to confirm if your symptoms continue or worsen.  Providers prescribe antibiotics to treat infections caused by bacteria. Antibiotics are very powerful in treating bacterial infections when they are used properly.  To maintain their effectiveness, they should be used only when necessary.  Overuse of antibiotics has resulted in the development of super bugs that are resistant to treatment!    Home Care: Only take medications as instructed by your medical team. Do not drink alcohol while taking these medications. A steam or ultrasonic humidifier can help congestion.  You can place a towel over your head and breathe in the steam from hot water coming from a faucet. Avoid close contacts especially the very young and the elderly. Cover your mouth when you cough or sneeze. Always remember to wash your hands.  Get Help Right Away If: You develop worsening fever or throat pain. You develop a severe head ache or visual changes. Your symptoms persist after you have completed your treatment plan.  Make sure you Understand these instructions. Will watch your condition. Will get help right away if you are not doing well or get worse.   Thank you for choosing an e-visit.  Your e-visit answers were reviewed by a board certified advanced clinical practitioner to complete your personal care plan. Depending upon the condition, your plan could have included both over the counter  or prescription medications.  Please review your pharmacy choice. Make sure the pharmacy is open so you can pick up prescription now. If there is a problem, you may contact your provider through [employer] and have the prescription routed to another pharmacy.  Your safety is important to [place]. If you have drug allergies check your prescription carefully.   For the next 24 hours you can use MyChart to ask questions about today's visit, request a  non-urgent call back, or ask for a work or school excuse. You will get an email in the next two days asking about your experience. I hope that your e-visit has been valuable and will speed your recovery.  I have spent 5 minutes in review of e-visit questionnaire, review and updating patient chart, medical decision making and response to patient.   Willeen Cass, PhD, FNP-BC

## 2021-12-22 ENCOUNTER — Ambulatory Visit: Payer: Medicare PPO

## 2021-12-28 ENCOUNTER — Ambulatory Visit: Payer: Medicare PPO

## 2022-01-01 ENCOUNTER — Other Ambulatory Visit: Payer: Self-pay | Admitting: Orthopedic Surgery

## 2022-01-01 DIAGNOSIS — Z96659 Presence of unspecified artificial knee joint: Secondary | ICD-10-CM

## 2022-01-03 NOTE — Progress Notes (Signed)
DUE TO COVID-19 ONLY ONE VISITOR IS ALLOWED TO COME WITH YOU AND STAY IN THE WAITING ROOM ONLY DURING PRE OP AND PROCEDURE DAY OF SURGERY.  2 VISITOR  MAY VISIT WITH YOU AFTER SURGERY IN YOUR PRIVATE ROOM DURING VISITING HOURS ONLY! YOU MAY HAVE ONE PERSON SPEND THE NITE WITH YOU IN YOUR ROOM AFTER SURGERY.     Your procedure is scheduled on:        01/22/2022   Report to [facility] Main  Entrance   Report to admitting at      0900am            AM DO NOT BRING INSURANCE CARD, PICTURE ID OR WALLET DAY OF SURGERY.      Call this number if you have problems the morning of surgery [phone]    REMEMBER: NO  SOLID FOODS , CANDY, GUM OR MINTS AFTER [place] .       Marland Kitchen CLEAR LIQUIDS UNTIL       0830am          DAY OF SURGERY.      PLEASE FINISH ENSURE DRINK PER SURGEON ORDER  WHICH NEEDS TO BE COMPLETED AT     0830am      MORNING OF SURGERY.       CLEAR LIQUID DIET   Foods Allowed      WATER BLACK COFFEE ( SUGAR OK, NO MILK, CREAM OR CREAMER) REGULAR AND DECAF  TEA ( SUGAR OK NO MILK, CREAM, OR CREAMER) REGULAR AND DECAF  PLAIN JELLO ( NO RED)  FRUIT ICES ( NO RED, NO FRUIT PULP)  POPSICLES ( NO RED)  JUICE- APPLE, WHITE GRAPE AND WHITE CRANBERRY  SPORT DRINK LIKE GATORADE ( NO RED)  CLEAR BROTH ( VEGETABLE , CHICKEN OR BEEF)                                                                     BRUSH YOUR TEETH MORNING OF SURGERY AND RINSE YOUR MOUTH OUT, NO CHEWING GUM CANDY OR MINTS.     Take these medicines the morning of surgery with A SIP OF WATER:  wellbutrin, omeprazole    DO NOT TAKE ANY DIABETIC MEDICATIONS DAY OF YOUR SURGERY                               You may not have any metal on your body including hair pins and              piercings  Do not wear jewelry, make-up, lotions, powders or perfumes, deodorant             Do not wear nail polish on your fingernails.              IF YOU ARE A FEMALE AND WANT TO SHAVE UNDER ARMS OR LEGS  PRIOR TO SURGERY YOU MUST DO SO AT LEAST 48 HOURS PRIOR TO SURGERY.              Men may shave face and neck.   Do not bring valuables to the hospital. [facility] IS NOT  RESPONSIBLE   FOR VALUABLES.  Contacts, dentures or bridgework may not be worn into surgery.  Leave suitcase in the car. After surgery it may be brought to your room.     Patients discharged the day of surgery will not be allowed to drive home. IF YOU ARE HAVING SURGERY AND GOING HOME THE SAME DAY, YOU MUST HAVE AN ADULT TO DRIVE YOU HOME AND BE WITH YOU FOR 24 HOURS. YOU MAY GO HOME BY TAXI OR UBER OR ORTHERWISE, BUT AN ADULT MUST ACCOMPANY YOU HOME AND STAY WITH YOU FOR 24 HOURS.                Please read over the following fact sheets you were given: _____________________________________________________________________  [facility] - Preparing for Surgery Before surgery, you can play an important role.  Because skin is not sterile, your skin needs to be as free of germs as possible.  You can reduce the number of germs on your skin by washing with CHG (chlorahexidine gluconate) soap before surgery.  CHG is an antiseptic cleaner which kills germs and bonds with the skin to continue killing germs even after washing. Please DO NOT use if you have an allergy to CHG or antibacterial soaps.  If your skin becomes reddened/irritated stop using the CHG and inform your nurse when you arrive at Short Stay. Do not shave (including legs and underarms) for at least 48 hours prior to the first CHG shower.  You may shave your face/neck. Please follow these instructions carefully:  1.  Shower with CHG Soap the night before surgery and the  morning of Surgery.  2.  If you choose to wash your hair, wash your hair first as usual with your  normal  shampoo.  3.  After you shampoo, rinse your hair and body thoroughly to remove the  shampoo.                           4.  Use CHG as you would any other liquid soap.  You can apply chg  directly  to the skin and wash                       Gently with a scrungie or clean washcloth.  5.  Apply the CHG Soap to your body ONLY FROM THE NECK DOWN.   Do not use on face/ open                           Wound or open sores. Avoid contact with eyes, ears mouth and genitals (private parts).                       Wash face,  Genitals (private parts) with your normal soap.             6.  Wash thoroughly, paying special attention to the area where your surgery  will be performed.  7.  Thoroughly rinse your body with warm water from the neck down.  8.  DO NOT shower/wash with your normal soap after using and rinsing off  the CHG Soap.                9.  Pat yourself dry with a clean towel.            10.  Wear clean pajamas.  11.  Place clean sheets on your bed the night of your first shower and do not  sleep with pets. Day of Surgery : Do not apply any lotions/deodorants the morning of surgery.  Please wear clean clothes to the hospital/surgery center.  FAILURE TO FOLLOW THESE INSTRUCTIONS MAY RESULT IN THE CANCELLATION OF YOUR SURGERY PATIENT SIGNATURE_________________________________  NURSE SIGNATURE__________________________________  ________________________________________________________________________

## 2022-01-03 NOTE — Progress Notes (Addendum)
Anesthesia Review:  PCP: DR alex Plotnikov  LOV 11/07/21  Cardiologist : none  Chest x-ray : EKG : 08/22/21  Echo : Stress test: Cardiac Cath :  Activity level: can do a flight of stairs without difficutly  Sleep Study/ CPAP : none  Fasting Blood Sugar :      / Checks Blood Sugar -- times a day:   Blood Thinner/ Instructions /Last Dose: ASA / Instructions/ Last Dose :   81 mg aspirin

## 2022-01-05 ENCOUNTER — Ambulatory Visit (INDEPENDENT_AMBULATORY_CARE_PROVIDER_SITE_OTHER): Payer: Medicare PPO

## 2022-01-05 DIAGNOSIS — Z0000 Encounter for general adult medical examination without abnormal findings: Secondary | ICD-10-CM | POA: Diagnosis not present

## 2022-01-05 DIAGNOSIS — Z13820 Encounter for screening for osteoporosis: Secondary | ICD-10-CM

## 2022-01-05 NOTE — Progress Notes (Signed)
**Note Karen-Identified via Obfuscation** I connected with [name] today by telephone and verified that I am speaking with the correct person using two identifiers. Location patient: home Location provider: work Persons participating in the virtual visit: patient, provider.   I discussed the limitations, risks, security and privacy concerns of performing an evaluation and management service by telephone and the availability of in person appointments. I also discussed with the patient that there may be a patient responsible charge related to this service. The patient expressed understanding and verbally consented to this telephonic visit.    Interactive audio and video telecommunications were attempted between this provider and patient, however failed, due to patient having technical difficulties OR patient did not have access to video capability.  We continued and completed visit with audio only.  Some vital signs may be absent or patient reported.   Time Spent with patient on telephone encounter: 30 minutes  Subjective:   [name] is a 76 y.o. female who presents for Medicare Annual (Subsequent) preventive examination.  Review of Systems     Cardiac Risk Factors include: advanced age (>24mn, >>90women);dyslipidemia;family history of premature cardiovascular disease;hypertension     Objective:    There were no vitals filed for this visit. There is no height or weight on file to calculate BMI.     01/05/2022    4:19 PM 09/04/2021    8:45 AM 08/31/2021    5:15 PM 07/15/2021    5:04 PM 04/16/2021   10:40 AM 12/21/2020    8:33 AM 07/30/2018   10:33 AM  Advanced Directives  Does Patient Have a Medical Advance Directive? No  No No No No No  Would patient like information on creating a medical advance directive? No - Patient declined No - Guardian declined   No - Patient declined No - Patient declined No - Patient declined    Current Medications (verified) Outpatient Encounter Medications as of 01/05/2022  Medication  Sig   ALPRAZolam (XANAX) 0.5 MG tablet Take 1 tablet (0.5 mg total) by mouth 2 (two) times daily as needed. (Patient taking differently: Take 0.25-0.5 mg by mouth 2 (two) times daily as needed for anxiety or sleep.)   aspirin 81 MG tablet Take 81 mg by mouth at bedtime.   bisacodyl (DULCOLAX) 5 MG EC tablet Take 5 mg by mouth daily as needed for moderate constipation.   buPROPion (WELLBUTRIN XL) 150 MG 24 hr tablet TAKE 1 TABLET BY MOUTH EVERY DAY   Cholecalciferol (VITAMIN D) 2000 UNITS CAPS Take 2,000 Units by mouth daily.   diclofenac Sodium (VOLTAREN) 1 % GEL Apply 1 Application topically 4 (four) times daily as needed (pain).   fluticasone (FLONASE) 50 MCG/ACT nasal spray Place 2 sprays into both nostrils daily. (Patient taking differently: Place 2 sprays into both nostrils daily as needed for allergies.)   gabapentin (NEURONTIN) 100 MG capsule Take 1 capsule (100 mg total) by mouth at bedtime.   HYDROcodone bit-homatropine (HYCODAN) 5-1.5 MG/5ML syrup Take 5 mLs by mouth every 6 (six) hours as needed for cough.   HYDROcodone-acetaminophen (NORCO/VICODIN) 5-325 MG tablet Take 1 tablet by mouth every 6 (six) hours as needed for severe pain. (Patient not taking: Reported on 01/03/2022)   ibuprofen (ADVIL) 200 MG tablet Take 200 mg by mouth every 6 (six) hours as needed for moderate pain.   lovastatin (MEVACOR) 40 MG tablet Take 1 tablet (40 mg total) by mouth at bedtime. Annual appt due W/LABS must see provider for future refills   Melatonin 5  MG CAPS Take 5 mg by mouth at bedtime as needed (sleep).   Multiple Vitamins-Minerals (PRESERVISION AREDS 2) CAPS Take 1 capsule by mouth daily.   omeprazole (PRILOSEC) 40 MG capsule Take 1 capsule (40 mg total) by mouth in the morning and at bedtime.   ondansetron (ZOFRAN) 4 MG tablet Take 1 tablet (4 mg total) by mouth every 8 (eight) hours as needed for nausea or vomiting.   solifenacin (VESICARE) 10 MG tablet TAKE 1 TABLET (10 MG TOTAL) BY MOUTH DAILY.  ANNUAL APPT IS DUE MUST SEE PROVIDER FOR FUTURE REFILLS   verapamil (CALAN-SR) 240 MG CR tablet Take 1 tablet (240 mg total) by mouth at bedtime.   vitamin B-12 (CYANOCOBALAMIN) 500 MCG tablet Take 500 mcg by mouth daily.   No facility-administered encounter medications on file as of 01/05/2022.    Allergies (verified) Levaquin [levofloxacin], Other, and Omnicef [cefdinir]   History: Past Medical History:  Diagnosis Date   Anemia    Anxiety    B12 DEFICIENCY    Blood transfusion without reported diagnosis    Cataract    Cholecystitis, unspecified    Chronic sinusitis    DEPRESSION    Diastolic dysfunction    DIVERTICULOSIS, COLON    DYSLIPIDEMIA    Dysrhythmia    PVC's   Fibromyalgia    GERD    GERD (gastroesophageal reflux disease)    Hyperplastic colon polyp    HYPERTENSION    INSOMNIA, PERSISTENT    Morbid obesity ([place])    OSTEOARTHRITIS    Osteoporosis    PONV (postoperative nausea and vomiting)    Skin cancer 2018   upper lip    SYNCOPE    Ulcer of esophagus    VITAMIN D DEFICIENCY    Past Surgical History:  Procedure Laterality Date   ABDOMINAL HYSTERECTOMY     APPENDECTOMY     BREAST BIOPSY Left 2010   Stero biopsy   BREAST EXCISIONAL BIOPSY Left 1985   Benign    CESAREAN SECTION     CHOLECYSTECTOMY     COLONOSCOPY     GASTROPLASTY     gatric stapling     LAPAROSCOPY N/A 01/30/2017   Procedure: LAPAROSCOPY DIAGNOSTIC WITH LAPAROSCOPIC LYSIS OF ADHESIONS;  Surgeon: Excell Seltzer, MD;  Location: [facility];  Service: General;  Laterality: N/A;   MIDDLE EAR SURGERY Right    NASAL SINUS SURGERY Left 08/06/2018   Procedure: Endoscopic SPHENOIDECTOMY;  Surgeon: Izora Gala, MD;  Location: [facility];  Service: ENT;  Laterality: Left;   revearsal of gastric stapling     TONSILLECTOMY     TOTAL KNEE ARTHROPLASTY     rt.   TUBAL LIGATION     TYMPANOPLASTY Right 10/22/2016   Procedure: RIGHT TYMPANOPLASTY;  Surgeon: Izora Gala, MD;   Location: [place];  Service: ENT;  Laterality: Right;   TYMPANOPLASTY Right 09/02/2017   Procedure: REVISION RIGHT TYMPANOPLASTY;  Surgeon: Izora Gala, MD;  Location: [facility] [place];  Service: ENT;  Laterality: Right;   Family History  Problem Relation Age of Onset   Emphysema Father    Heart disease Father    Arthritis Mother    Hypertension Mother        pulmonary   Heart disease Mother    Colon cancer Maternal Aunt    Diabetes Sister    Lung cancer Sister    Colon cancer Maternal Uncle    Colon cancer Other  nephew   Stomach cancer Other    Diabetes Maternal Aunt        x 4   Diabetes Maternal Uncle        x 3   Diabetes Paternal Uncle         1   Kidney disease Other        niece   Diabetes Sister    COPD Sister    Heart Problems Daughter    Diabetes Daughter    Breast cancer Paternal Aunt 91   Esophageal cancer Neg Hx    Liver disease Neg Hx    Rectal cancer Neg Hx    Social History   Socioeconomic History   Marital status: Widowed    Spouse name: Not on file   Number of children: 2   Years of education: Not on file   Highest education level: Some college, no degree  Occupational History   Occupation: office work  Tobacco Use   Smoking status: Never   Smokeless tobacco: Never  Vaping Use   Vaping Use: Never used  Substance and Sexual Activity   Alcohol use: No   Drug use: No   Sexual activity: Not on file  Other Topics Concern   Not on file  Social History Narrative   Patient is right-handed. She lives with her husband in a one level home. She drinks 1-2 diet Cokes a day and rarely drinks coffee. She does not exercise.   Social Determinants of Health   Financial Resource Strain: Low Risk  (01/05/2022)   Overall Financial Resource Strain (CARDIA)    Difficulty of Paying Living Expenses: Not hard at all  Food Insecurity: No Food Insecurity (01/05/2022)   Hunger Vital Sign    Worried About Running Out of Food in  the Last Year: Never true    Ran Out of Food in the Last Year: Never true  Transportation Needs: No Transportation Needs (01/05/2022)   PRAPARE - Hydrologist (Medical): No    Lack of Transportation (Non-Medical): No  Physical Activity: Sufficiently Active (01/05/2022)   Exercise Vital Sign    Days of Exercise per Week: 5 days    Minutes of Exercise per Session: 30 min  Stress: No Stress Concern Present (01/05/2022)   [place]    Feeling of Stress : Not at all  Social Connections: Moderately Integrated (01/05/2022)   Social Connection and Isolation Panel [NHANES]    Frequency of Communication with Friends and Family: More than three times a week    Frequency of Social Gatherings with Friends and Family: Once a week    Attends Religious Services: More than 4 times per year    Active Member of [employer] or Organizations: Yes    Attends Archivist Meetings: More than 4 times per year    Marital Status: Widowed    Tobacco Counseling Counseling given: Not Answered   Clinical Intake:  Pre-visit preparation completed: Yes  Pain : No/denies pain     BMI - recorded: 32.33 (11/07/2021) Nutritional Status: BMI > 30  Obese Nutritional Risks: None Diabetes: No  How often do you need to have someone help you when you read instructions, pamphlets, or other written materials from your doctor or pharmacy?: 1 - Never What is the last grade level you completed in school?: HSG; some college  Diabetic? no  Interpreter Needed?: No  Information entered by :: Lisette Abu, LPN.  Activities of Daily Living    01/05/2022    4:19 PM 08/31/2021    9:11 PM  In your present state of health, do you have any difficulty performing the following activities:  Hearing? 0   Vision? 0   Difficulty concentrating or making decisions? 0   Walking or climbing stairs? 0   Dressing or bathing? 0    Doing errands, shopping? 0 1  Preparing Food and eating ? N   Using the Toilet? N   In the past six months, have you accidently leaked urine? N   Do you have problems with loss of bowel control? N   Managing your Medications? N   Managing your Finances? N   Housekeeping or managing your Housekeeping? N     Patient Care Team: Plotnikov, Evie Lacks, MD as PCP - General Henrene Pastor Docia Chuck, MD (Gastroenterology) Bo Merino, MD (Rheumatology) Lonia Skinner, MD as Consulting Physician (Ophthalmology)  Indicate any recent Medical Services you may have received from other than [facility] providers in the past year (date may be approximate).     Assessment:   This is a routine wellness examination for [place].  Hearing/Vision screen Hearing Screening - Comments:: Patient denied any hearing difficulty.   No hearing aids.  Vision Screening - Comments:: Patient does wear readers.  Eye exam done by: Gala Romney, [name]   Dietary issues and exercise activities discussed: Current Exercise Habits: Home exercise routine, Type of exercise: walking, Time (Minutes): 30, Frequency (Times/Week): 5, Weekly Exercise (Minutes/Week): 150, Intensity: Moderate, Exercise limited by: orthopedic condition(s)   Goals Addressed             This Visit's Progress    My goal is to get my knee from hurting and stay healthy.        Depression Screen    01/05/2022    4:18 PM 11/07/2021    4:04 PM 12/21/2020    8:42 AM 01/16/2017    9:16 AM 12/28/2015   10:11 AM 12/24/2014    3:03 PM  PHQ 2/9 Scores  PHQ - 2 Score 0 1 0 3 1 0  PHQ- 9 Score    11      Fall Risk    01/05/2022    4:06 PM 11/07/2021    4:04 PM 08/04/2021   10:18 AM 12/21/2020    8:35 AM 04/08/2020   10:09 AM  Fall Risk   Falls in the past year? '1 1 1 '$ 0 0  Number falls in past yr: 1 1 0 0 0  Injury with Fall? '1 1 1 '$ 0 0  Risk for fall due to :   History of fall(s) No Fall Risks   Follow up   Falls evaluation completed Falls evaluation completed      [place]:  Any stairs in or around the home? No  If so, are there any without handrails? No  Home free of loose throw rugs in walkways, pet beds, electrical cords, etc? Yes  Adequate lighting in your home to reduce risk of falls? Yes   ASSISTIVE DEVICES UTILIZED TO PREVENT FALLS:  Life alert? No  Use of a cane, walker or w/c? No  Grab bars in the bathroom? Yes  Shower chair or bench in shower? Yes  Elevated toilet seat or a handicapped toilet? Yes   TIMED UP AND GO:  Was the test performed? No .  Length of time to ambulate 10 feet: n/a sec.   Appearance  of gait: Gait not evaluated during this visit.  Cognitive Function:        01/05/2022    4:21 PM  6CIT Screen  What Year? 0 points  What month? 0 points  What time? 0 points  Count back from 20 0 points  Months in reverse 0 points  Repeat phrase 0 points  Total Score 0 points    Immunizations Immunization History  Administered Date(s) Administered   Fluad Quad(high Dose 65+) 02/04/2019, 04/08/2020, 03/14/2021   Influenza Split 05/21/2011   Influenza Whole 03/23/2008, 03/23/2009, 03/01/2010   Influenza, High Dose Seasonal PF 04/21/2013, 02/23/2016, 02/27/2017, 02/26/2018   Influenza,inj,Quad PF,6+ Mos 02/26/2014   Influenza-Unspecified 05/25/2015   PFIZER(Purple Top)SARS-COV-2 Vaccination 08/02/2019, 08/24/2019   Pneumococcal Conjugate-13 01/16/2017   Pneumococcal Polysaccharide-23 06/16/2013   Td 10/14/2009   Tdap 07/15/2021   Zoster, Live 07/28/2010    TDAP status: Up to date  Flu Vaccine status: Up to date  Pneumococcal vaccine status: Up to date  Covid-19 vaccine status: Completed vaccines  Qualifies for Shingles Vaccine? Yes   Zostavax completed Yes   Shingrix Completed?: No.    Education has been provided regarding the importance of this vaccine. Patient has been advised to call insurance company to determine out of pocket expense if they have not yet  received this vaccine. Advised may also receive vaccine at local pharmacy or Health Dept. Verbalized acceptance and understanding.  Screening Tests Health Maintenance  Topic Date Due   Zoster Vaccines- Shingrix (1 of 2) Never done   COVID-19 Vaccine (3 - Pfizer risk series) 09/21/2019   INFLUENZA VACCINE  01/16/2022   COLONOSCOPY (Pts 45-31yr Insurance coverage will need to be confirmed)  02/10/2024   TETANUS/TDAP  07/16/2031   Pneumonia Vaccine 76 Years old  Completed   DEXA SCAN  Completed   Hepatitis C Screening  Completed   HPV VACCINES  Aged Out    Health Maintenance  Health Maintenance Due  Topic Date Due   Zoster Vaccines- Shingrix (1 of 2) Never done   COVID-19 Vaccine (3 - Pfizer risk series) 09/21/2019    Colorectal cancer screening: Type of screening: Colonoscopy. Completed 02/10/2019. Repeat every 5 years  Mammogram status: Completed 03/27/2021. Repeat every year  Bone Density status: Ordered 01/05/2022. Pt provided with contact info and advised to call to schedule appt.  Lung Cancer Screening: (Low Dose CT Chest recommended if Age 705-80years, 30 pack-year currently smoking OR have quit w/in 15years.) does not qualify.   Lung Cancer Screening Referral: no  Additional Screening:  Hepatitis C Screening: does qualify; Completed 12/28/2015  Vision Screening: Recommended annual ophthalmology exams for early detection of glaucoma and other disorders of the eye. Is the patient up to date with their annual eye exam?  Yes  Who is the provider or what is the name of the office in which the patient attends annual eye exams? SGala Romney MD. If pt is not established with a provider, would they like to be referred to a provider to establish care? No .   Dental Screening: Recommended annual dental exams for proper oral hygiene  Community Resource Referral / Chronic Care Management: CRR required this visit?  No   CCM required this visit?  No      Plan:     I have  personally reviewed and noted the following in the patient's chart:   Medical and social history Use of alcohol, tobacco or illicit drugs  Current medications and supplements including opioid prescriptions.  Functional  ability and status Nutritional status Physical activity Advanced directives List of other physicians Hospitalizations, surgeries, and ER visits in previous 12 months Vitals Screenings to include cognitive, depression, and falls Referrals and appointments  In addition, I have reviewed and discussed with patient certain preventive protocols, quality metrics, and best practice recommendations. A written personalized care plan for preventive services as well as general preventive health recommendations were provided to patient.     Sheral Flow, LPN   [id number]   Nurse Notes:  There were no vitals filed for this visit. There is no height or weight on file to calculate BMI. Medications reviewed with patient; yes opioid use noted.

## 2022-01-05 NOTE — Patient Instructions (Signed)
[name] , Thank you for taking time to come for your Medicare Wellness Visit. I appreciate your ongoing commitment to your health goals. Please review the following plan we discussed and let me know if I can assist you in the future.   Screening recommendations/referrals: Colonoscopy: 02/10/2019; due every 5 years Mammogram: 03/27/2021; due every year Bone Density: ordered 01/05/2022; [facility] will call patient to schedule Recommended yearly ophthalmology/optometry visit for glaucoma screening and checkup Recommended yearly dental visit for hygiene and checkup  Vaccinations: Influenza vaccine: 03/14/2021 Pneumococcal vaccine: 06/16/2013, 01/16/2017 Tdap vaccine: 07/15/2021; due every 10 years Shingles vaccine: no record   Covid-19: 08/02/2019, 08/24/2019  Advanced directives: No  Conditions/risks identified: Yes  Next appointment: Please schedule your next Medicare Wellness Visit with your Nurse Health Advisor in 1 year by calling [phone].   Preventive Care 76 Years and Older, Female Preventive care refers to lifestyle choices and visits with your health care provider that can promote health and wellness. What does preventive care include? A yearly physical exam. This is also called an annual well check. Dental exams once or twice a year. Routine eye exams. Ask your health care provider how often you should have your eyes checked. Personal lifestyle choices, including: Daily care of your teeth and gums. Regular physical activity. Eating a healthy diet. Avoiding tobacco and drug use. Limiting alcohol use. Practicing safe sex. Taking low-dose aspirin every day. Taking vitamin and mineral supplements as recommended by your health care provider. What happens during an annual well check? The services and screenings done by your health care provider during your annual well check will depend on your age, overall health, lifestyle risk factors, and family history of disease. Counseling   Your health care provider may ask you questions about your: Alcohol use. Tobacco use. Drug use. Emotional well-being. Home and relationship well-being. Sexual activity. Eating habits. History of falls. Memory and ability to understand (cognition). Work and work Statistician. Reproductive health. Screening  You may have the following tests or measurements: Height, weight, and BMI. Blood pressure. Lipid and cholesterol levels. These may be checked every 5 years, or more frequently if you are over 76 years old. Skin check. Lung cancer screening. You may have this screening every year starting at age 76 if you have a 30-pack-year history of smoking and currently smoke or have quit within the past 15 years. Fecal occult blood test (FOBT) of the stool. You may have this test every year starting at age 76. Flexible sigmoidoscopy or colonoscopy. You may have a sigmoidoscopy every 5 years or a colonoscopy every 10 years starting at age 76. Hepatitis C blood test. Hepatitis B blood test. Sexually transmitted disease (STD) testing. Diabetes screening. This is done by checking your blood sugar (glucose) after you have not eaten for a while (fasting). You may have this done every 1-3 years. Bone density scan. This is done to screen for osteoporosis. You may have this done starting at age 76. Mammogram. This may be done every 1-2 years 76. Talk to your health care provider about how often you should have regular mammograms. Talk with your health care provider about your test results, treatment options, and if necessary, the need for more tests. Vaccines  Your health care provider may recommend certain vaccines, such as: Influenza vaccine. This is recommended every year. Tetanus, diphtheria, and acellular pertussis (Tdap, Td) vaccine. You may need a Td booster every 10 years. Zoster vaccine. You may need this after age 87. Pneumococcal 13-valent conjugate (PCV13) vaccine. One  dose is recommended  after age 76. Pneumococcal polysaccharide (PPSV23) vaccine. One dose is recommended after age 76. Talk to your health care provider about which screenings and vaccines you need and how often you need them. This information is not intended to replace advice given to you by your health care provider. Make sure you discuss any questions you have with your health care provider. Document Released: 07/01/2015 Document Revised: 02/22/2016 Document Reviewed: 04/05/2015 Elsevier Interactive Patient Education  2017 [employer] Prevention in the Home Falls can cause injuries. They can happen to people of all ages. There are many things you can do to make your home safe and to help prevent falls. What can I do on the outside of my home? Regularly fix the edges of walkways and driveways and fix any cracks. Remove anything that might make you trip as you walk through a door, such as a raised step or threshold. Trim any bushes or trees on the path to your home. Use bright outdoor lighting. Clear any walking paths of anything that might make someone trip, such as rocks or tools. Regularly check to see if handrails are loose or broken. Make sure that both sides of any steps have handrails. Any raised decks and porches should have guardrails on the edges. Have any leaves, snow, or ice cleared regularly. Use sand or salt on walking paths during winter. Clean up any spills in your garage right away. This includes oil or grease spills. What can I do in the bathroom? Use night lights. Install grab bars by the toilet and in the tub and shower. Do not use towel bars as grab bars. Use non-skid mats or decals in the tub or shower. If you need to sit down in the shower, use a plastic, non-slip stool. Keep the floor dry. Clean up any water that spills on the floor as soon as it happens. Remove soap buildup in the tub or shower regularly. Attach bath mats securely with double-sided non-slip rug tape. Do not  have throw rugs and other things on the floor that can make you trip. What can I do in the bedroom? Use night lights. Make sure that you have a light by your bed that is easy to reach. Do not use any sheets or blankets that are too big for your bed. They should not hang down onto the floor. Have a firm chair that has side arms. You can use this for support while you get dressed. Do not have throw rugs and other things on the floor that can make you trip. What can I do in the kitchen? Clean up any spills right away. Avoid walking on wet floors. Keep items that you use a lot in easy-to-reach places. If you need to reach something above you, use a strong step stool that has a grab bar. Keep electrical cords out of the way. Do not use floor polish or wax that makes floors slippery. If you must use wax, use non-skid floor wax. Do not have throw rugs and other things on the floor that can make you trip. What can I do with my stairs? Do not leave any items on the stairs. Make sure that there are handrails on both sides of the stairs and use them. Fix handrails that are broken or loose. Make sure that handrails are as long as the stairways. Check any carpeting to make sure that it is firmly attached to the stairs. Fix any carpet that is loose or worn.  Avoid having throw rugs at the top or bottom of the stairs. If you do have throw rugs, attach them to the floor with carpet tape. Make sure that you have a light switch at the top of the stairs and the bottom of the stairs. If you do not have them, ask someone to add them for you. What else can I do to help prevent falls? Wear shoes that: Do not have high heels. Have rubber bottoms. Are comfortable and fit you well. Are closed at the toe. Do not wear sandals. If you use a stepladder: Make sure that it is fully opened. Do not climb a closed stepladder. Make sure that both sides of the stepladder are locked into place. Ask someone to hold it for  you, if possible. Clearly mark and make sure that you can see: Any grab bars or handrails. First and last steps. Where the edge of each step is. Use tools that help you move around (mobility aids) if they are needed. These include: Canes. Walkers. Scooters. Crutches. Turn on the lights when you go into a dark area. Replace any light bulbs as soon as they burn out. Set up your furniture so you have a clear path. Avoid moving your furniture around. If any of your floors are uneven, fix them. If there are any pets around you, be aware of where they are. Review your medicines with your doctor. Some medicines can make you feel dizzy. This can increase your chance of falling. Ask your doctor what other things that you can do to help prevent falls. This information is not intended to replace advice given to you by your health care provider. Make sure you discuss any questions you have with your health care provider. Document Released: 03/31/2009 Document Revised: 11/10/2015 Document Reviewed: 07/09/2014 Elsevier Interactive Patient Education  2017 [employer].

## 2022-01-08 ENCOUNTER — Other Ambulatory Visit: Payer: Self-pay

## 2022-01-08 ENCOUNTER — Encounter (HOSPITAL_COMMUNITY): Payer: Self-pay

## 2022-01-08 ENCOUNTER — Encounter (HOSPITAL_COMMUNITY)
Admission: RE | Admit: 2022-01-08 | Discharge: 2022-01-08 | Disposition: A | Discharge status: Home / Self Care | Payer: Medicare PPO | Source: Ambulatory Visit | Attending: Orthopedic Surgery | Admitting: Orthopedic Surgery

## 2022-01-08 VITALS — BP 148/68 | HR 75 | Temp 98.1°F | Resp 16 | Ht 63.5 in | Wt 169.0 lb

## 2022-01-08 DIAGNOSIS — Z01812 Encounter for preprocedural laboratory examination: Secondary | ICD-10-CM | POA: Diagnosis not present

## 2022-01-08 DIAGNOSIS — E559 Vitamin D deficiency, unspecified: Secondary | ICD-10-CM | POA: Insufficient documentation

## 2022-01-08 DIAGNOSIS — Z96659 Presence of unspecified artificial knee joint: Secondary | ICD-10-CM

## 2022-01-08 DIAGNOSIS — Z01818 Encounter for other preprocedural examination: Secondary | ICD-10-CM

## 2022-01-08 DIAGNOSIS — G030 Nonpyogenic meningitis: Secondary | ICD-10-CM | POA: Insufficient documentation

## 2022-01-08 LAB — COMPREHENSIVE METABOLIC PANEL
ALT: 10 U/L (ref 0–44)
AST: 10 U/L — ABNORMAL LOW (ref 15–41)
Albumin: 3.7 g/dL (ref 3.5–5.0)
Alkaline Phosphatase: 64 U/L (ref 38–126)
Anion gap: 5 (ref 5–15)
BUN: 11 mg/dL (ref 8–23)
CO2: 25 mmol/L (ref 22–32)
Calcium: 8.8 mg/dL — ABNORMAL LOW (ref 8.9–10.3)
Chloride: 108 mmol/L (ref 98–111)
Creatinine, Ser: 0.76 mg/dL (ref 0.44–1.00)
GFR, Estimated: >60 mL/min (ref >60)
Glucose, Bld: 90 mg/dL (ref 70–99)
Potassium: 3.9 mmol/L (ref 3.5–5.1)
Sodium: 138 mmol/L (ref 135–145)
Total Bilirubin: 0.7 mg/dL (ref 0.3–1.2)
Total Protein: 6.7 g/dL (ref 6.5–8.1)

## 2022-01-08 LAB — CBC WITH DIFFERENTIAL/PLATELET
Abs Immature Granulocytes: 0.03 10*3/uL (ref 0.00–0.07)
Basophils Absolute: 0.1 10*3/uL (ref 0.0–0.1)
Basophils Relative: 1 %
Eosinophils Absolute: 0.3 10*3/uL (ref 0.0–0.5)
Eosinophils Relative: 4 %
HCT: 37.1 % (ref 36.0–46.0)
Hemoglobin: 11.8 g/dL — ABNORMAL LOW (ref 12.0–15.0)
Immature Granulocytes: 0 %
Lymphocytes Relative: 34 %
Lymphs Abs: 2.4 10*3/uL (ref 0.7–4.0)
MCH: 29.5 pg (ref 26.0–34.0)
MCHC: 31.8 g/dL (ref 30.0–36.0)
MCV: 92.8 fL (ref 80.0–100.0)
Monocytes Absolute: 0.6 10*3/uL (ref 0.1–1.0)
Monocytes Relative: 8 %
Neutro Abs: 3.7 10*3/uL (ref 1.7–7.7)
Neutrophils Relative %: 53 %
Platelets: 257 10*3/uL (ref 150–400)
RBC: 4 MIL/uL (ref 3.87–5.11)
RDW: 13.9 % (ref 11.5–15.5)
WBC: 7.1 10*3/uL (ref 4.0–10.5)
nRBC: 0 % (ref 0.0–0.2)

## 2022-01-08 LAB — SURGICAL PCR SCREEN
MRSA, PCR: NEGATIVE
Staphylococcus aureus: NEGATIVE

## 2022-01-10 ENCOUNTER — Other Ambulatory Visit (HOSPITAL_COMMUNITY): Payer: Self-pay

## 2022-01-21 NOTE — Anesthesia Preprocedure Evaluation (Addendum)
Anesthesia Evaluation  Patient identified by MRN, date of birth, ID band Patient awake    Reviewed: Allergy & Precautions, NPO status , Patient's Chart, lab work & pertinent test results  Airway Mallampati: II  TM Distance: >3 FB Neck ROM: Full    Dental no notable dental hx. (+) Caps, Implants, Teeth Intact   Pulmonary    Pulmonary exam normal breath sounds clear to auscultation       Cardiovascular hypertension, Normal cardiovascular exam Rhythm:Regular Rate:Normal  08/2021 echo 1. Left ventricular ejection fraction, by estimation, is 60 to 65%. The  left ventricle has normal function. The left ventricle has no regional  wall motion abnormalities. Left ventricular diastolic parameters are  consistent with Grade I diastolic  dysfunction (impaired relaxation).  2. Right ventricular systolic function is normal. The right ventricular  size is normal. There is mildly elevated pulmonary artery systolic  pressure. The estimated right ventricular systolic pressure is 37.1 mmHg.  3. The mitral valve is normal in structure. Trivial mitral valve  regurgitation. No evidence of mitral stenosis.  4. The aortic valve is tricuspid. Aortic valve regurgitation is not  visualized. No aortic stenosis is present.  5. The inferior vena cava is normal in size with greater than 50%  respiratory variability, suggesting right atrial pressure of 3 mmHg.  6. The patient was in ventricular bigeminy   Neuro/Psych PSYCHIATRIC DISORDERS Anxiety Depression    GI/Hepatic Neg liver ROS, PUD, GERD  Medicated and Controlled,  Endo/Other    Renal/GU Lab Results      Component                Value               Date                      CREATININE               0.76                01/08/2022                K                        3.9                 01/08/2022                    Musculoskeletal  (+) Arthritis , Osteoarthritis,  Fibromyalgia -   Abdominal   Peds  Hematology Lab Results      Component                Value               Date                      WBC                      7.1                 01/08/2022                HGB                      11.8 (L)            01/08/2022  HCT                      37.1                01/08/2022                     PLT                      257                 01/08/2022              Anesthesia Other Findings All: Levofloxacin, Cefdinir  Reproductive/Obstetrics                            Anesthesia Physical Anesthesia Plan  ASA: 2  Anesthesia Plan: Spinal   Post-op Pain Management: Regional block* and Minimal or no pain anticipated   Induction:   PONV Risk Score and Plan: 3 and Treatment may vary due to age or medical condition  Airway Management Planned: Natural Airway, Nasal Cannula and Simple Face Mask  Additional Equipment: None  Intra-op Plan:   Post-operative Plan:   Informed Consent: I have reviewed the patients History and Physical, chart, labs and discussed the procedure including the risks, benefits and alternatives for the proposed anesthesia with the patient or authorized representative who has indicated his/her understanding and acceptance.     Dental advisory given  Plan Discussed with: CRNA  Anesthesia Plan Comments: (Spinal w R adductor)       Anesthesia Quick Evaluation

## 2022-01-22 ENCOUNTER — Inpatient Hospital Stay (HOSPITAL_COMMUNITY): Payer: Medicare PPO | Admitting: Physician Assistant

## 2022-01-22 ENCOUNTER — Inpatient Hospital Stay (HOSPITAL_COMMUNITY): Payer: Medicare PPO | Admitting: Anesthesiology

## 2022-01-22 ENCOUNTER — Inpatient Hospital Stay (HOSPITAL_COMMUNITY)
Admission: RE | Admit: 2022-01-22 | Discharge: 2022-01-26 | DRG: 468 | Disposition: A | Discharge status: Home / Self Care | Payer: Medicare PPO | Attending: Orthopedic Surgery | Admitting: Orthopedic Surgery

## 2022-01-22 ENCOUNTER — Other Ambulatory Visit: Payer: Self-pay

## 2022-01-22 ENCOUNTER — Encounter (HOSPITAL_COMMUNITY)
Admission: RE | Disposition: A | Discharge status: Home / Self Care | Payer: Self-pay | Source: Home / Self Care | Attending: Orthopedic Surgery

## 2022-01-22 ENCOUNTER — Encounter (HOSPITAL_COMMUNITY): Payer: Self-pay | Admitting: Orthopedic Surgery

## 2022-01-22 DIAGNOSIS — M810 Age-related osteoporosis without current pathological fracture: Secondary | ICD-10-CM | POA: Diagnosis present

## 2022-01-22 DIAGNOSIS — Z79899 Other long term (current) drug therapy: Secondary | ICD-10-CM | POA: Diagnosis not present

## 2022-01-22 DIAGNOSIS — E669 Obesity, unspecified: Secondary | ICD-10-CM | POA: Diagnosis present

## 2022-01-22 DIAGNOSIS — H9191 Unspecified hearing loss, right ear: Secondary | ICD-10-CM | POA: Diagnosis present

## 2022-01-22 DIAGNOSIS — M797 Fibromyalgia: Secondary | ICD-10-CM | POA: Diagnosis not present

## 2022-01-22 DIAGNOSIS — T84018A Broken internal joint prosthesis, other site, initial encounter: Secondary | ICD-10-CM | POA: Diagnosis not present

## 2022-01-22 DIAGNOSIS — K5900 Constipation, unspecified: Secondary | ICD-10-CM | POA: Diagnosis present

## 2022-01-22 DIAGNOSIS — Z833 Family history of diabetes mellitus: Secondary | ICD-10-CM

## 2022-01-22 DIAGNOSIS — Z801 Family history of malignant neoplasm of trachea, bronchus and lung: Secondary | ICD-10-CM

## 2022-01-22 DIAGNOSIS — M25561 Pain in right knee: Secondary | ICD-10-CM | POA: Diagnosis present

## 2022-01-22 DIAGNOSIS — Z888 Allergy status to other drugs, medicaments and biological substances status: Secondary | ICD-10-CM | POA: Diagnosis not present

## 2022-01-22 DIAGNOSIS — Z8249 Family history of ischemic heart disease and other diseases of the circulatory system: Secondary | ICD-10-CM

## 2022-01-22 DIAGNOSIS — T84032A Mechanical loosening of internal right knee prosthetic joint, initial encounter: Principal | ICD-10-CM | POA: Diagnosis present

## 2022-01-22 DIAGNOSIS — E785 Hyperlipidemia, unspecified: Secondary | ICD-10-CM | POA: Diagnosis present

## 2022-01-22 DIAGNOSIS — Z86010 Personal history of colonic polyps: Secondary | ICD-10-CM

## 2022-01-22 DIAGNOSIS — I10 Essential (primary) hypertension: Secondary | ICD-10-CM | POA: Diagnosis not present

## 2022-01-22 DIAGNOSIS — Z800 Family history of malignant neoplasm of digestive organs: Secondary | ICD-10-CM | POA: Diagnosis not present

## 2022-01-22 DIAGNOSIS — Z7982 Long term (current) use of aspirin: Secondary | ICD-10-CM

## 2022-01-22 DIAGNOSIS — Z825 Family history of asthma and other chronic lower respiratory diseases: Secondary | ICD-10-CM | POA: Diagnosis not present

## 2022-01-22 DIAGNOSIS — Z8261 Family history of arthritis: Secondary | ICD-10-CM

## 2022-01-22 DIAGNOSIS — Z96651 Presence of right artificial knee joint: Secondary | ICD-10-CM | POA: Diagnosis not present

## 2022-01-22 DIAGNOSIS — Z96653 Presence of artificial knee joint, bilateral: Secondary | ICD-10-CM | POA: Diagnosis not present

## 2022-01-22 DIAGNOSIS — Z96659 Presence of unspecified artificial knee joint: Principal | ICD-10-CM

## 2022-01-22 DIAGNOSIS — Y792 Prosthetic and other implants, materials and accessory orthopedic devices associated with adverse incidents: Secondary | ICD-10-CM | POA: Diagnosis present

## 2022-01-22 DIAGNOSIS — Z8661 Personal history of infections of the central nervous system: Secondary | ICD-10-CM

## 2022-01-22 DIAGNOSIS — Z01818 Encounter for other preprocedural examination: Secondary | ICD-10-CM

## 2022-01-22 DIAGNOSIS — Z803 Family history of malignant neoplasm of breast: Secondary | ICD-10-CM

## 2022-01-22 DIAGNOSIS — Z85828 Personal history of other malignant neoplasm of skin: Secondary | ICD-10-CM

## 2022-01-22 DIAGNOSIS — M1990 Unspecified osteoarthritis, unspecified site: Secondary | ICD-10-CM | POA: Diagnosis present

## 2022-01-22 DIAGNOSIS — Z6831 Body mass index (BMI) 31.0-31.9, adult: Secondary | ICD-10-CM

## 2022-01-22 DIAGNOSIS — K219 Gastro-esophageal reflux disease without esophagitis: Secondary | ICD-10-CM | POA: Diagnosis present

## 2022-01-22 DIAGNOSIS — G8918 Other acute postprocedural pain: Secondary | ICD-10-CM | POA: Diagnosis not present

## 2022-01-22 HISTORY — PX: TOTAL KNEE REVISION: SHX996

## 2022-01-22 LAB — TYPE AND SCREEN
ABO/RH(D): O POS
Antibody Screen: NEGATIVE

## 2022-01-22 SURGERY — TOTAL KNEE REVISION
Anesthesia: Spinal | Site: Knee | Laterality: Right

## 2022-01-22 MED ORDER — FLEET ENEMA 7-19 GM/118ML RE ENEM: 1.0000 | ENEMA | Freq: Once | RECTAL | Status: DC | PRN

## 2022-01-22 MED ORDER — BUPIVACAINE-EPINEPHRINE (PF) 0.25% -1:200000 IJ SOLN
INTRAMUSCULAR | Status: AC
  Filled 2022-01-22: qty 30

## 2022-01-22 MED ORDER — BUPIVACAINE LIPOSOME 1.3 % IJ SUSP
INTRAMUSCULAR | Status: DC | PRN
  Administered 2022-01-22: 20 mL

## 2022-01-22 MED ORDER — ROPIVACAINE HCL 2 MG/ML IJ SOLN
INTRAMUSCULAR | Status: DC | PRN
  Administered 2022-01-22: 30 mL via PERINEURAL

## 2022-01-22 MED ORDER — OXYCODONE HCL 5 MG/5ML PO SOLN: 5.0000 mg | Freq: Once | ORAL | Status: DC | PRN

## 2022-01-22 MED ORDER — ALUM & MAG HYDROXIDE-SIMETH 200-200-20 MG/5ML PO SUSP
30.0000 mL | ORAL | Status: DC | PRN
  Administered 2022-01-23 – 2022-01-24 (×2): 30 mL via ORAL
  Filled 2022-01-22 (×2): qty 30

## 2022-01-22 MED ORDER — SODIUM CHLORIDE 0.9% FLUSH
INTRAVENOUS | Status: DC | PRN
  Administered 2022-01-22: 20 mL via INTRAVENOUS

## 2022-01-22 MED ORDER — ACETAMINOPHEN 10 MG/ML IV SOLN: 1000.0000 mg | Freq: Once | INTRAVENOUS | Status: DC | PRN

## 2022-01-22 MED ORDER — LACTATED RINGERS IV SOLN: INTRAVENOUS | Status: DC

## 2022-01-22 MED ORDER — ONDANSETRON HCL 4 MG/2ML IJ SOLN: 4.0000 mg | Freq: Once | INTRAMUSCULAR | Status: DC | PRN

## 2022-01-22 MED ORDER — PANTOPRAZOLE SODIUM 40 MG PO TBEC
40.0000 mg | DELAYED_RELEASE_TABLET | Freq: Every day | ORAL | Status: DC
  Administered 2022-01-23 – 2022-01-26 (×4): 40 mg via ORAL
  Filled 2022-01-22 (×4): qty 1

## 2022-01-22 MED ORDER — AMISULPRIDE (ANTIEMETIC) 5 MG/2ML IV SOLN: 10.0000 mg | Freq: Once | INTRAVENOUS | Status: DC | PRN

## 2022-01-22 MED ORDER — ACETAMINOPHEN 500 MG PO TABS
1000.0000 mg | ORAL_TABLET | Freq: Once | ORAL | Status: AC
  Administered 2022-01-22: 1000 mg via ORAL
  Filled 2022-01-22: qty 2

## 2022-01-22 MED ORDER — VERAPAMIL HCL ER 240 MG PO TBCR
240.0000 mg | EXTENDED_RELEASE_TABLET | Freq: Every day | ORAL | Status: DC
Start: 2022-01-22 — End: 2022-01-26
  Administered 2022-01-22 – 2022-01-25 (×4): 240 mg via ORAL
  Filled 2022-01-22 (×4): qty 1

## 2022-01-22 MED ORDER — PHENYLEPHRINE 80 MCG/ML (10ML) SYRINGE FOR IV PUSH (FOR BLOOD PRESSURE SUPPORT)
PREFILLED_SYRINGE | INTRAVENOUS | Status: AC
  Filled 2022-01-22: qty 40

## 2022-01-22 MED ORDER — MIDAZOLAM HCL 2 MG/2ML IJ SOLN
1.0000 mg | INTRAMUSCULAR | Status: DC
  Filled 2022-01-22: qty 2

## 2022-01-22 MED ORDER — CLONIDINE HCL (ANALGESIA) 100 MCG/ML EP SOLN
EPIDURAL | Status: DC | PRN
  Administered 2022-01-22: 100 ug

## 2022-01-22 MED ORDER — DOCUSATE SODIUM 100 MG PO CAPS
100.0000 mg | ORAL_CAPSULE | Freq: Two times a day (BID) | ORAL | Status: DC
  Administered 2022-01-22 – 2022-01-26 (×8): 100 mg via ORAL
  Filled 2022-01-22 (×8): qty 1

## 2022-01-22 MED ORDER — SODIUM CHLORIDE 0.9 % IV SOLN: INTRAVENOUS | Status: DC

## 2022-01-22 MED ORDER — GABAPENTIN 100 MG PO CAPS
100.0000 mg | ORAL_CAPSULE | Freq: Every day | ORAL | Status: DC
  Administered 2022-01-22 – 2022-01-25 (×4): 100 mg via ORAL
  Filled 2022-01-22 (×4): qty 1

## 2022-01-22 MED ORDER — STERILE WATER FOR IRRIGATION IR SOLN
Status: DC | PRN
  Administered 2022-01-22: 2000 mL

## 2022-01-22 MED ORDER — ZOLPIDEM TARTRATE 5 MG PO TABS: 5.0000 mg | ORAL_TABLET | Freq: Every evening | ORAL | Status: DC | PRN

## 2022-01-22 MED ORDER — BISACODYL 5 MG PO TBEC: 5.0000 mg | DELAYED_RELEASE_TABLET | Freq: Every day | ORAL | Status: DC | PRN

## 2022-01-22 MED ORDER — CEFAZOLIN SODIUM-DEXTROSE 2-4 GM/100ML-% IV SOLN
2.0000 g | INTRAVENOUS | Status: AC
  Administered 2022-01-22: 2 g via INTRAVENOUS
  Filled 2022-01-22: qty 100

## 2022-01-22 MED ORDER — HYDROMORPHONE HCL 1 MG/ML IJ SOLN
INTRAMUSCULAR | Status: AC
  Filled 2022-01-22: qty 1

## 2022-01-22 MED ORDER — PANTOPRAZOLE SODIUM 40 MG PO TBEC: 40.0000 mg | DELAYED_RELEASE_TABLET | Freq: Every day | ORAL | Status: DC

## 2022-01-22 MED ORDER — BUPIVACAINE LIPOSOME 1.3 % IJ SUSP: 20.0000 mL | Freq: Once | INTRAMUSCULAR | Status: DC

## 2022-01-22 MED ORDER — PROPOFOL 500 MG/50ML IV EMUL
INTRAVENOUS | Status: DC | PRN
  Administered 2022-01-22: 75 ug/kg/min via INTRAVENOUS

## 2022-01-22 MED ORDER — FENTANYL CITRATE PF 50 MCG/ML IJ SOSY
50.0000 ug | PREFILLED_SYRINGE | INTRAMUSCULAR | Status: AC
  Administered 2022-01-22: 50 ug via INTRAVENOUS
  Filled 2022-01-22: qty 2

## 2022-01-22 MED ORDER — BUPIVACAINE LIPOSOME 1.3 % IJ SUSP
INTRAMUSCULAR | Status: AC
  Filled 2022-01-22: qty 20

## 2022-01-22 MED ORDER — DEXAMETHASONE SODIUM PHOSPHATE 10 MG/ML IJ SOLN
10.0000 mg | Freq: Once | INTRAMUSCULAR | Status: AC
  Administered 2022-01-23: 10 mg via INTRAVENOUS
  Filled 2022-01-22: qty 1

## 2022-01-22 MED ORDER — BUPIVACAINE-EPINEPHRINE 0.5% -1:200000 IJ SOLN
INTRAMUSCULAR | Status: DC | PRN
  Administered 2022-01-22: 30 mL

## 2022-01-22 MED ORDER — SODIUM CHLORIDE 0.9 % IR SOLN
Status: DC | PRN
  Administered 2022-01-22: 1000 mL

## 2022-01-22 MED ORDER — PRAVASTATIN SODIUM 20 MG PO TABS
40.0000 mg | ORAL_TABLET | Freq: Every day | ORAL | Status: DC
  Administered 2022-01-22 – 2022-01-25 (×4): 40 mg via ORAL
  Filled 2022-01-22 (×4): qty 2

## 2022-01-22 MED ORDER — ONDANSETRON HCL 4 MG PO TABS
4.0000 mg | ORAL_TABLET | Freq: Four times a day (QID) | ORAL | Status: DC | PRN
  Administered 2022-01-24: 4 mg via ORAL
  Filled 2022-01-22: qty 1

## 2022-01-22 MED ORDER — PHENYLEPHRINE 80 MCG/ML (10ML) SYRINGE FOR IV PUSH (FOR BLOOD PRESSURE SUPPORT)
PREFILLED_SYRINGE | INTRAVENOUS | Status: DC | PRN
  Administered 2022-01-22 (×3): 80 ug via INTRAVENOUS

## 2022-01-22 MED ORDER — FERROUS SULFATE 325 (65 FE) MG PO TABS
325.0000 mg | ORAL_TABLET | Freq: Three times a day (TID) | ORAL | Status: DC
  Administered 2022-01-23 – 2022-01-26 (×10): 325 mg via ORAL
  Filled 2022-01-22 (×10): qty 1

## 2022-01-22 MED ORDER — METOCLOPRAMIDE HCL 5 MG PO TABS: 5.0000 mg | ORAL_TABLET | Freq: Three times a day (TID) | ORAL | Status: DC | PRN

## 2022-01-22 MED ORDER — ONDANSETRON HCL 4 MG/2ML IJ SOLN
4.0000 mg | Freq: Four times a day (QID) | INTRAMUSCULAR | Status: DC | PRN
  Administered 2022-01-22 – 2022-01-24 (×3): 4 mg via INTRAVENOUS
  Filled 2022-01-22 (×3): qty 2

## 2022-01-22 MED ORDER — POVIDONE-IODINE 10 % EX SWAB
2.0000 | Freq: Once | CUTANEOUS | Status: AC
  Administered 2022-01-22: 2 via TOPICAL

## 2022-01-22 MED ORDER — ONDANSETRON HCL 4 MG/2ML IJ SOLN
INTRAMUSCULAR | Status: DC | PRN
  Administered 2022-01-22: 4 mg via INTRAVENOUS

## 2022-01-22 MED ORDER — OXYCODONE HCL 5 MG PO TABS
5.0000 mg | ORAL_TABLET | ORAL | Status: DC | PRN
  Administered 2022-01-22 – 2022-01-23 (×3): 10 mg via ORAL
  Administered 2022-01-23 (×2): 5 mg via ORAL
  Administered 2022-01-23: 10 mg via ORAL
  Administered 2022-01-24: 5 mg via ORAL
  Administered 2022-01-24: 10 mg via ORAL
  Administered 2022-01-24: 5 mg via ORAL
  Administered 2022-01-25 (×2): 10 mg via ORAL
  Filled 2022-01-22: qty 1
  Filled 2022-01-22: qty 2
  Filled 2022-01-22: qty 1
  Filled 2022-01-22: qty 2
  Filled 2022-01-22 (×2): qty 1
  Filled 2022-01-22 (×2): qty 2
  Filled 2022-01-22: qty 1
  Filled 2022-01-22 (×2): qty 2
  Filled 2022-01-22: qty 1
  Filled 2022-01-22: qty 2

## 2022-01-22 MED ORDER — METHOCARBAMOL 500 MG PO TABS
500.0000 mg | ORAL_TABLET | Freq: Four times a day (QID) | ORAL | Status: DC | PRN
  Administered 2022-01-22 – 2022-01-25 (×4): 500 mg via ORAL
  Filled 2022-01-22 (×4): qty 1

## 2022-01-22 MED ORDER — TRANEXAMIC ACID-NACL 1000-0.7 MG/100ML-% IV SOLN
1000.0000 mg | INTRAVENOUS | Status: AC
  Administered 2022-01-22: 1000 mg via INTRAVENOUS
  Filled 2022-01-22: qty 100

## 2022-01-22 MED ORDER — FESOTERODINE FUMARATE ER 4 MG PO TB24
4.0000 mg | ORAL_TABLET | Freq: Every day | ORAL | Status: DC
  Administered 2022-01-23 – 2022-01-26 (×4): 4 mg via ORAL
  Filled 2022-01-22 (×4): qty 1

## 2022-01-22 MED ORDER — METOCLOPRAMIDE HCL 5 MG/ML IJ SOLN
5.0000 mg | Freq: Three times a day (TID) | INTRAMUSCULAR | Status: DC | PRN
  Administered 2022-01-22: 10 mg via INTRAVENOUS
  Filled 2022-01-22: qty 2

## 2022-01-22 MED ORDER — DEXAMETHASONE SODIUM PHOSPHATE 10 MG/ML IJ SOLN: 8.0000 mg | Freq: Once | INTRAMUSCULAR | Status: DC

## 2022-01-22 MED ORDER — ASPIRIN 81 MG PO CHEW
81.0000 mg | CHEWABLE_TABLET | Freq: Two times a day (BID) | ORAL | Status: DC
  Administered 2022-01-23 – 2022-01-26 (×7): 81 mg via ORAL
  Filled 2022-01-22 (×7): qty 1

## 2022-01-22 MED ORDER — ACETAMINOPHEN 500 MG PO TABS
1000.0000 mg | ORAL_TABLET | Freq: Four times a day (QID) | ORAL | Status: AC
  Administered 2022-01-22 – 2022-01-23 (×4): 1000 mg via ORAL
  Filled 2022-01-22 (×4): qty 2

## 2022-01-22 MED ORDER — SODIUM CHLORIDE (PF) 0.9 % IJ SOLN
INTRAMUSCULAR | Status: AC
  Filled 2022-01-22: qty 20

## 2022-01-22 MED ORDER — OXYCODONE HCL 5 MG PO TABS: 5.0000 mg | ORAL_TABLET | Freq: Once | ORAL | Status: DC | PRN

## 2022-01-22 MED ORDER — ORAL CARE MOUTH RINSE: 15.0000 mL | Freq: Once | OROMUCOSAL | Status: AC

## 2022-01-22 MED ORDER — BUPROPION HCL ER (XL) 150 MG PO TB24
150.0000 mg | ORAL_TABLET | Freq: Every day | ORAL | Status: DC
  Administered 2022-01-23 – 2022-01-26 (×4): 150 mg via ORAL
  Filled 2022-01-22 (×4): qty 1

## 2022-01-22 MED ORDER — HYDROMORPHONE HCL 1 MG/ML IJ SOLN: 0.5000 mg | INTRAMUSCULAR | Status: DC | PRN

## 2022-01-22 MED ORDER — 0.9 % SODIUM CHLORIDE (POUR BTL) OPTIME
TOPICAL | Status: DC | PRN
  Administered 2022-01-22: 1000 mL

## 2022-01-22 MED ORDER — SENNOSIDES-DOCUSATE SODIUM 8.6-50 MG PO TABS
1.0000 | ORAL_TABLET | Freq: Every evening | ORAL | Status: DC | PRN
  Administered 2022-01-22: 1 via ORAL
  Filled 2022-01-22: qty 1

## 2022-01-22 MED ORDER — CHLORHEXIDINE GLUCONATE 0.12 % MT SOLN
15.0000 mL | Freq: Once | OROMUCOSAL | Status: AC
  Administered 2022-01-22: 15 mL via OROMUCOSAL

## 2022-01-22 MED ORDER — DIPHENHYDRAMINE HCL 12.5 MG/5ML PO ELIX: 12.5000 mg | ORAL_SOLUTION | ORAL | Status: DC | PRN

## 2022-01-22 MED ORDER — GABAPENTIN 300 MG PO CAPS
300.0000 mg | ORAL_CAPSULE | Freq: Once | ORAL | Status: AC
  Administered 2022-01-22: 300 mg via ORAL
  Filled 2022-01-22: qty 1

## 2022-01-22 MED ORDER — MENTHOL 3 MG MT LOZG: 1.0000 | LOZENGE | OROMUCOSAL | Status: DC | PRN

## 2022-01-22 MED ORDER — METHOCARBAMOL 500 MG IVPB - SIMPLE MED: 500.0000 mg | Freq: Four times a day (QID) | INTRAVENOUS | Status: DC | PRN

## 2022-01-22 MED ORDER — FENTANYL CITRATE (PF) 250 MCG/5ML IJ SOLN
INTRAMUSCULAR | Status: DC | PRN
  Administered 2022-01-22: 50 ug via INTRAVENOUS

## 2022-01-22 MED ORDER — FENTANYL CITRATE (PF) 100 MCG/2ML IJ SOLN
INTRAMUSCULAR | Status: AC
  Filled 2022-01-22: qty 2

## 2022-01-22 MED ORDER — HYDROMORPHONE HCL 1 MG/ML IJ SOLN
0.2500 mg | INTRAMUSCULAR | Status: DC | PRN
  Administered 2022-01-22 (×2): 0.5 mg via INTRAVENOUS

## 2022-01-22 MED ORDER — PHENOL 1.4 % MT LIQD
1.0000 | OROMUCOSAL | Status: DC | PRN
Start: 2022-01-22 — End: 2022-01-26

## 2022-01-22 SURGICAL SUPPLY — 66 items
AUG FEM 5 5+ 5 STRL KN POST (Post) ×2 IMPLANT
BAG COUNTER SPONGE SURGICOUNT (BAG) ×1 IMPLANT
BAG SPEC THK2 15X12 ZIP CLS (MISCELLANEOUS) ×1
BAG SPNG CNTER NS LX DISP (BAG) ×1
BAG ZIPLOCK 12X15 (MISCELLANEOUS) ×3 IMPLANT
BLADE SAGITTAL 13X1.27X60 (BLADE) ×4 IMPLANT
BLADE SAW SGTL 81X20 HD (BLADE) ×3 IMPLANT
BLADE SAW SGTL 83.5X18.5 (BLADE) ×3 IMPLANT
BLADE SURG 15 STRL LF DISP TIS (BLADE) ×2 IMPLANT
BLADE SURG 15 STRL SS (BLADE) ×2
BLADE SURG SZ10 CARB STEEL (BLADE) ×12 IMPLANT
BNDG CMPR MED 15X6 ELC VLCR LF (GAUZE/BANDAGES/DRESSINGS) ×1
BNDG ELASTIC 6X15 VLCR STRL LF (GAUZE/BANDAGES/DRESSINGS) ×3 IMPLANT
BNDG ELASTIC 6X5.8 VLCR STR LF (GAUZE/BANDAGES/DRESSINGS) ×1 IMPLANT
BONE CEMENT SIMPLEX TOBRAMYCIN (Cement) ×6 IMPLANT
BOWL SMART MIX CTS (DISPOSABLE) ×3 IMPLANT
CEMENT BONE SIMPLEX TOBRAMYCIN (Cement) ×6 IMPLANT
CLSR STERI-STRIP ANTIMIC 1/2X4 (GAUZE/BANDAGES/DRESSINGS) ×1 IMPLANT
COMP FEM STD SZ5 (Joint) ×2 IMPLANT
COMP TIB PERS KNEE SZ D RT (Joint) ×2 IMPLANT
COMPONENT FEM STD SZ5 (Joint) IMPLANT
COMPONENT TIB PERS KN SZD RT (Joint) IMPLANT
COVER SURGICAL LIGHT HANDLE (MISCELLANEOUS) ×3 IMPLANT
CUFF TOURN SGL QUICK 34 (TOURNIQUET CUFF) ×2
CUFF TRNQT CYL 34X4.125X (TOURNIQUET CUFF) IMPLANT
DRAPE INCISE IOBAN 66X45 STRL (DRAPES) ×15 IMPLANT
DRAPE U-SHAPE 47X51 STRL (DRAPES) ×3 IMPLANT
DRSG AQUACEL AG ADV 3.5X10 (GAUZE/BANDAGES/DRESSINGS) ×3 IMPLANT
DURAPREP 26ML APPLICATOR (WOUND CARE) ×3 IMPLANT
ELECT REM PT RETURN 15FT ADLT (MISCELLANEOUS) ×3 IMPLANT
GLOVE BIOGEL PI IND STRL 7.5 (GLOVE) IMPLANT
GLOVE BIOGEL PI IND STRL 8.5 (GLOVE) ×4 IMPLANT
GLOVE BIOGEL PI INDICATOR 7.5 (GLOVE)
GLOVE BIOGEL PI INDICATOR 8.5 (GLOVE) ×2
GLOVE SURG LX 7.5 STRW (GLOVE)
GLOVE SURG LX STRL 7.5 STRW (GLOVE) IMPLANT
GLOVE SURG ORTHO 8.0 STRL STRW (GLOVE) ×9 IMPLANT
GOWN STRL REUS W/ TWL XL LVL3 (GOWN DISPOSABLE) ×4 IMPLANT
GOWN STRL REUS W/TWL XL LVL3 (GOWN DISPOSABLE) ×4
HANDPIECE INTERPULSE COAX TIP (DISPOSABLE) ×2
HOLDER FOLEY CATH W/STRAP (MISCELLANEOUS) ×1 IMPLANT
HOOD PEEL AWAY FLYTE STAYCOOL (MISCELLANEOUS) ×9 IMPLANT
KIT TURNOVER KIT A (KITS) ×1 IMPLANT
LINER TIB PS CD/3-5 18 RT (Liner) ×1 IMPLANT
MANIFOLD NEPTUNE II (INSTRUMENTS) ×3 IMPLANT
NEEDLE HYPO 22GX1.5 SAFETY (NEEDLE) ×3 IMPLANT
NS IRRIG 1000ML POUR BTL (IV SOLUTION) ×3 IMPLANT
PACK TOTAL KNEE CUSTOM (KITS) ×3 IMPLANT
POST KNEE AUG PERS 5X5+ 5 (Post) ×2 IMPLANT
PROTECTOR NERVE ULNAR (MISCELLANEOUS) ×3 IMPLANT
SET HNDPC FAN SPRY TIP SCT (DISPOSABLE) ×2 IMPLANT
SPIKE FLUID TRANSFER (MISCELLANEOUS) IMPLANT
STAPLER VISISTAT 35W (STAPLE) IMPLANT
STEM EXT FEM STRAIGHT 13X135 (Miscellaneous) ×1 IMPLANT
STEM FEM PROS 14 +135 (Stem) ×1 IMPLANT
STRIP CLOSURE SKIN 1/2X4 (GAUZE/BANDAGES/DRESSINGS) ×3 IMPLANT
SUT BONE WAX W31G (SUTURE) ×3 IMPLANT
SUT MNCRL AB 3-0 PS2 18 (SUTURE) ×3 IMPLANT
SUT STRATAFIX 0 PDS 27 VIOLET (SUTURE) ×2
SUT STRATAFIX PDS+ 0 24IN (SUTURE) ×3 IMPLANT
SUT VIC AB 1 CT1 36 (SUTURE) ×3 IMPLANT
SUTURE STRATFX 0 PDS 27 VIOLET (SUTURE) ×2 IMPLANT
SYR CONTROL 10ML LL (SYRINGE) ×6 IMPLANT
TRAY FOLEY MTR SLVR 16FR STAT (SET/KITS/TRAYS/PACK) ×3 IMPLANT
WATER STERILE IRR 1000ML POUR (IV SOLUTION) ×3 IMPLANT
WRAP KNEE MAXI GEL POST OP (GAUZE/BANDAGES/DRESSINGS) ×1 IMPLANT

## 2022-01-22 NOTE — Op Note (Signed)
TOTAL KNEE REPLACEMENT OPERATIVE NOTE:  01/22/2022  2:15 PM  PATIENT:  [name]  76 y.o. 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  Informed consent was obtained prior to anesthesia. The risks versus benefits of the operation were explain and in a way the patient can, and did, understand.    Description of procedure:     The patient was taken to the operating room and placed under anesthesia.  The patient was positioned in the usual fashion taking care that all body parts were adequately padded and/or protected.  A tourniquet was applied and the leg prepped and draped in the usual sterile fashion.  The extremity was exsanguinated with the esmarch and tourniquet inflated to 300 mmHg.  Pre-operative range of motion was normal.  The knee was in 5 degree of mild varus.   A midline incision approximately 6-7 inches long was made with a #10 blade.  A new blade was used to make a parapatellar arthrotomy going 2-3 cm into the quadriceps tendon, over the patella, and alongside the medial aspect of the patellar tendon.  A synovectomy was then performed with the #10 blade and forceps. I then elevated the deep MCL off the medial tibial metaphysis subperiosteally around to the semimembranosus attachment.     I everted the patella and debrided it and there were no issues with the  existing component so it was left alone.     A homan retractor was place to retract and protect the patella and lateral structures.  A Z-retractor was place medially to protect the medial structures.  A micro e saw was used to loosen the femoral component and revision osteotomes were used to aide in removal. Cement was debrided and removed using a rongeur and there was minimal bone loss noted on the posterior femoral condyles.   I then did the same technique on the tibia and removed the component with a micro e saw and revision osteotomes. Cement was removed with a curette and large rongeur. There was minimal bone loss.      I then marked out the epicondylar axis on the distal femur.   I then used the anterior referencing sizer and measured the femur to be a size 5.  The 4-In-1 cutting block was screwed into place in external rotation matching the posterior condylar angle, making our cuts perpendicular to the epicondylar axis.  Anterior, posterior  chamfer cuts were made with the sagittal saw.  The cutting block and cut pieces were removed. I used a starter reamer on power and then reamed up by hand to a 57m by 1356mwith afforded good fixation. We used a 66m32mosterior augment on both condyles which allowed for good bone contact throughout.    The Tibia was delivered forward and a size D revision tibia was selected. It was prepped normally and then I used a starter reamer on power and then reamed up by hand  to a 59m by 1373mwhich afforded good fixation.       I then trialed with the size 5 femur with a 1472my 135m2mem with 5mm 55mterior augments, size D tibia with a 13mm 49m35mm s37m and a  18  mm insert.  I had excellent flexion/extension gap balance, excellent patella tracking.  Flexion was full and beyond 120 degrees; extension was zero.  These components were chosen and the staff opened them to me on the back table while the knee was lavaged copiously and the cement mixed.      The soft  tissue was infiltrated with 60cc of exparel 1.3% through a 21 gauge needle.  I cemented in the components and removed all excess cement.  The polyethylene tibial component was snapped into place and the knee placed in extension while cement was hardening.  Once the cement was hard, the tourniquet was let down.  Hemostasis was obtained.  The arthrotomy was closed using a #1 stratofix running suture.  The deep soft tissues were closed with #0 vicryls and the subcuticular layer closed with #2-0 vicryl.  The skin was reapproximated and closed with 3.0 Monocryl.  The wound was covered with steristrips, aquacel dressing, and a TED stocking.   The patient was then awakened, extubated, and taken to the recovery room in stable condition.  BLOOD LOSS:  300cc C284XLCATIONS:  None.  PLAN OF CARE: Admit to inpatient   PATIENT DISPOSITION:  PACU - hemodynamically stable.   Delay start of Pharmacological VTE agent (>24hrs) due to surgical blood loss or risk of bleeding:  yes  Please fax a copy of this op note to my office at [phone] only include page 1 and 2 of the Case Information op note)

## 2022-01-22 NOTE — Progress Notes (Signed)
Orthopedic Tech Progress Note Patient Details:  [name] 1946/01/26 [id number]  Patient ID: [name], female   DOB: 03-04-1946, 76 y.o.   MRN: [id number]  [name] 01/22/2022, 2:36 PM Bone foam given to patient in pacu. Cpm placed on right leg in pacu

## 2022-01-22 NOTE — Evaluation (Signed)
Physical Therapy Evaluation Patient Details Name: [name] MRN: [id number] DOB: 1945/06/29 Today's Date: 01/22/2022  History of Present Illness  Pt is a 76yo female presenting s/p R-TKR on 8.7.23 PMH: Dsyrhythmia, GERD, fibromyalgia, HTN, R-TKA.  Clinical Impression  [name] is a 76 y.o. female POD 0 s/p R-total knee revision. Patient reports modified independence using Spc for mobility at baseline. Patient is now limited by functional impairments (see PT problem list below) and requires min guard for bed mobility and min guard for transfers, further mobility deferred as pt was nauseated and lightheaded, RN notified. Patient instructed in exercise to facilitate ROM and circulation to manage edema. Patient will benefit from continued skilled PT interventions to address impairments and progress towards PLOF. Acute PT will follow to progress mobility and stair training in preparation for safe discharge home.       Recommendations for follow up therapy are one component of a multi-disciplinary discharge planning process, led by the attending physician.  Recommendations may be updated based on patient status, additional functional criteria and insurance authorization.  Follow Up Recommendations Follow physician's recommendations for discharge plan and follow up therapies      Assistance Recommended at Discharge Intermittent Supervision/Assistance  Patient can return home with the following  A little help with walking and/or transfers;Two people to help with walking and/or transfers;Assistance with cooking/housework;Assist for transportation;Help with stairs or ramp for entrance    Equipment Recommendations None recommended by PT  Recommendations for Other Services       Functional Status Assessment Patient has had a recent decline in their functional status and demonstrates the ability to make significant improvements in function in a reasonable and predictable amount of time.      Precautions / Restrictions Precautions Precautions: Fall Restrictions Weight Bearing Restrictions: No Other Position/Activity Restrictions: WBAT      Mobility  Bed Mobility Overal bed mobility: Needs Assistance Bed Mobility: Supine to Sit     Supine to sit: Min guard     General bed mobility comments: For safety only, no physical assist required    Transfers Overall transfer level: Needs assistance Equipment used: Rolling walker (2 wheels) Transfers: Sit to/from Stand, Bed to chair/wheelchair/BSC Sit to Stand: Min guard, From elevated surface   Step pivot transfers: Min guard       General transfer comment: Pt min guard for transfers for safety only, VCs for sequencing. Once in recliner, pt reporting nausea and lightheadedness. Pt continued to be alert and oriented, able to name daughter and granddaughter, location, symmetrical facial movements.  Provided emesis bag, RN notified.    Ambulation/Gait               General Gait Details: deferred  Stairs            Wheelchair Mobility    Modified Rankin (Stroke Patients Only)       Balance Overall balance assessment: Needs assistance Sitting-balance support: Feet supported, No upper extremity supported Sitting balance-Leahy Scale: Good     Standing balance support: Reliant on assistive device for balance, During functional activity, Bilateral upper extremity supported Standing balance-Leahy Scale: Poor                               Pertinent Vitals/Pain Pain Assessment Pain Assessment: 0-10 Pain Score: 6  Pain Location: right knee Pain Descriptors / Indicators: Operative site guarding Pain Intervention(s): Limited activity within patient's tolerance, Monitored during session, Repositioned,  Ice applied    Home Living Family/patient expects to be discharged to:: Private residence Living Arrangements: Other relatives (Grand-daughter and daughter) Available Help at Discharge:  Family;Available 24 hours/day;Friend(s) (Daughter will help until 8/20 Loss adjuster, chartered), Friend at night) Type of Home: House Home Access: Stairs to enter (level entry through patio at the back of the house) Entrance Stairs-Rails: Right;Left;Can reach both [employer] steps) Technical brewer of Steps: 5   Home Layout: One level Home Equipment: Conservation officer, nature (2 wheels);BSC/3in1      Prior Function Prior Level of Function : Independent/Modified Independent             Mobility Comments: SPC for household mobility ADLs Comments: ind     Hand Dominance        Extremity/Trunk Assessment   Upper Extremity Assessment Upper Extremity Assessment: Overall WFL for tasks assessed    Lower Extremity Assessment Lower Extremity Assessment: RLE deficits/detail;LLE deficits/detail RLE Deficits / Details: MMT ank DF/PF 4/5, no extensor lag noted RLE Sensation: decreased light touch (slightly reduced) LLE Deficits / Details: MMT ank DF/PF 4/5 LLE Sensation: WNL    Cervical / Trunk Assessment Cervical / Trunk Assessment: Kyphotic  Communication   Communication: No difficulties  Cognition Arousal/Alertness: Awake/alert Behavior During Therapy: WFL for tasks assessed/performed Overall Cognitive Status: Within Functional Limits for tasks assessed                                          General Comments      Exercises Total Joint Exercises Ankle Circles/Pumps: AROM, Both, 10 reps   Assessment/Plan    PT Assessment Patient needs continued PT services  PT Problem List Decreased strength;Decreased range of motion;Decreased activity tolerance;Decreased balance;Decreased mobility;Decreased coordination;Pain       PT Treatment Interventions DME instruction;Gait training;Stair training;Functional mobility training;Therapeutic activities;Therapeutic exercise;Balance training;Neuromuscular re-education;Patient/family education    PT Goals (Current goals can be found  in the Care Plan section)  Acute Rehab PT Goals Patient Stated Goal: Move without pain PT Goal Formulation: With patient Time For Goal Achievement: 01/29/22 Potential to Achieve Goals: Good    Frequency 7X/week     Co-evaluation               AM-PAC PT "6 Clicks" Mobility  Outcome Measure Help needed turning from your back to your side while in a flat bed without using bedrails?: None Help needed moving from lying on your back to sitting on the side of a flat bed without using bedrails?: A Little Help needed moving to and from a bed to a chair (including a wheelchair)?: A Little Help needed standing up from a chair using your arms (e.g., wheelchair or bedside chair)?: A Little Help needed to walk in hospital room?: A Little Help needed climbing 3-5 steps with a railing? : A Little 6 Click Score: 19    End of Session Equipment Utilized During Treatment: Gait belt Activity Tolerance: Patient tolerated treatment well;No increased pain Patient left: in chair;with call bell/phone within reach;with chair alarm set Nurse Communication: Mobility status PT Visit Diagnosis: Pain;Difficulty in walking, not elsewhere classified (R26.2) Pain - Right/Left: Right Pain - part of body: Knee    Time: 4098-1191 PT Time Calculation (min) (ACUTE ONLY): 25 min   Charges:   PT Evaluation $PT Eval Low Complexity: [place], PT, DPT [facility] Department Office:  [phone] Pager: [phone]  Coolidge Breeze 01/22/2022, 6:40 PM

## 2022-01-22 NOTE — Anesthesia Procedure Notes (Signed)
Anesthesia Regional Block: Adductor canal block   Pre-Anesthetic Checklist: , timeout performed,  Correct Patient, Correct Site, Correct Laterality,  Correct Procedure, Correct Position, site marked,  Risks and benefits discussed,  Surgical consent,  Pre-op evaluation,  At surgeon's request and post-op pain management  Laterality: Lower and Right  Prep: chloraprep       Needles:  Injection technique: Single-shot  Needle Type: Echogenic Needle     Needle Length: 9cm  Needle Gauge: 22     Additional Needles:   Procedures:,,,, ultrasound used (permanent image in chart),,    Narrative:  Start time: 01/22/2022 11:10 AM End time: 01/22/2022 11:15 AM Injection made incrementally with aspirations every 5 mL.  Performed by: Personally  Anesthesiologist: Barnet Glasgow, MD  Additional Notes: Block assessed prior to surgery. Pt tolerated procedure well.

## 2022-01-22 NOTE — Transfer of Care (Signed)
Immediate Anesthesia Transfer of Care Note  Patient: [name]  Procedure(s) Performed: TOTAL KNEE REVISION (Right: Knee)  Patient Location: PACU  Anesthesia Type:Spinal  Level of Consciousness: sedated, patient cooperative and responds to stimulation  Airway & Oxygen Therapy: Patient Spontanous Breathing and Patient connected to face mask oxygen  Post-op Assessment: Report given to RN and Post -op Vital signs reviewed and stable  Post vital signs: Reviewed and stable  Last Vitals:  Vitals Value Taken Time  BP 112/53 01/22/22 1358  Temp    Pulse 82 01/22/22 1400  Resp 16 01/22/22 1400  SpO2 100 % 01/22/22 1400  Vitals shown include unvalidated device data.  Last Pain:  Vitals:   01/22/22 1110  TempSrc:   PainSc: 0-No pain         Complications: No notable events documented.

## 2022-01-22 NOTE — Anesthesia Postprocedure Evaluation (Signed)
Anesthesia Post Note  Patient: [name]  Procedure(s) Performed: TOTAL KNEE REVISION (Right: Knee)     Patient location during evaluation: Nursing Unit Anesthesia Type: Spinal Level of consciousness: oriented and awake and alert Pain management: pain level controlled Vital Signs Assessment: post-procedure vital signs reviewed and stable Respiratory status: spontaneous breathing and respiratory function stable Cardiovascular status: blood pressure returned to baseline and stable Postop Assessment: no headache, no backache, no apparent nausea or vomiting and patient able to bend at knees Anesthetic complications: no   No notable events documented.  Last Vitals:  Vitals:   01/22/22 1515 01/22/22 1527  BP: (!) 102/54 103/60  Pulse: 66 76  Resp: 10 14  Temp: (!) 36.3 C 36.4 C  SpO2: 95% 100%    Last Pain:  Vitals:   01/22/22 1548  TempSrc:   PainSc: [place]

## 2022-01-22 NOTE — H&P (Signed)
[name] MRN:  [id number] DOB/SEX:  1945-12-30/female  CHIEF COMPLAINT:  Painful right Knee  HISTORY: Patient is a 76 y.o. female presented with a history of pain in the right knee. Onset of symptoms was gradual starting a few months ago with gradually worsening course since that time. Patient has been treated conservatively with over-the-counter NSAIDs and activity modification. Patient currently rates pain in the knee at 10 out of 10 with activity. There is pain at night.  PAST MEDICAL HISTORY: Patient Active Problem List   Diagnosis Date Noted   Menopausal syndrome 09/19/2021   Primary fibromyalgia syndrome 09/19/2021   Viral syndrome    Fever of unknown origin (FUO)    Delirium    Aseptic meningitis    Severe sepsis ([place]) 08/31/2021   SIRS (systemic inflammatory response syndrome) ([place]) 51/88/4166   Acute metabolic encephalopathy 12/16/1599   Abnormal microbiological findings in CSF 08/31/2021   Frequent PVCs 08/31/2021   Concussion 08/04/2021   Knee pain, right 10/17/2020   Family history of lung cancer 04/08/2020   Coronary atherosclerosis 04/08/2020   Shoulder pain, left 04/08/2020   Laryngitis 11/19/2019   Wart of face 06/08/2019   Earache on right 05/04/2019   Chronic paroxysmal hemicrania, not intractable 05/30/2018   Blurred vision, bilateral 02/19/2018   Constipation 03/22/2017   Ileus ([place])    Dehydration    SBO (small bowel obstruction) ([place]) 02/07/2017   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   Abdominal pain, LLQ 01/26/2017   Intussusception ([place]) 01/26/2017   Conductive hearing loss of right ear 09/18/2016   Hearing loss 07/13/2016   Status post total bilateral knee replacement 01/05/2016   Influenza 09/16/2015   Headache 12/24/2014   Neoplasm of uncertain behavior of skin 12/24/2014   Mild cognitive disorder 09/22/2014   Dysuria 04/07/2014   Cough 08/25/2013   Well adult exam 06/16/2013   Sinusitis, chronic 06/09/2013   URI, acute 06/09/2012    Restless legs syndrome (RLS) 01/26/2011   FINGER PAIN 07/28/2010   PALPITATIONS 10/18/2008   Vitamin D deficiency 10/09/2008   Dyslipidemia 10/09/2008   Morbid obesity ([place]) 10/09/2008   Essential hypertension 10/09/2008   SYNCOPE 09/27/2008   Eustachian tube disorder 04/09/2008   INSOMNIA, PERSISTENT 07/08/2007   Adjustment disorder with mixed anxiety and depressed mood 07/08/2007   RHINITIS 07/08/2007   B12 deficiency 04/07/2007   ANEMIA, DUE TO BLOOD LOSS 04/07/2007   OSTEOARTHRITIS 04/07/2007   GERD 04/04/2007   Diverticulosis of colon 04/04/2007   Past Medical History:  Diagnosis Date   Anemia    Anxiety    B12 DEFICIENCY    Blood transfusion without reported diagnosis    Cataract    Cholecystitis, unspecified    Chronic sinusitis    DEPRESSION    Diastolic dysfunction    DIVERTICULOSIS, COLON    DYSLIPIDEMIA    Dysrhythmia    PVC's   Fibromyalgia    GERD    GERD (gastroesophageal reflux disease)    Hyperplastic colon polyp    HYPERTENSION    INSOMNIA, PERSISTENT    Morbid obesity ([place])    OSTEOARTHRITIS    Osteoporosis    PONV (postoperative nausea and vomiting)    Skin cancer 2018   upper lip    SYNCOPE    Ulcer of esophagus    VITAMIN D DEFICIENCY    Past Surgical History:  Procedure Laterality Date   ABDOMINAL HYSTERECTOMY     APPENDECTOMY     BREAST BIOPSY Left 2010   Stero biopsy  BREAST EXCISIONAL BIOPSY Left 1985   Benign    CESAREAN SECTION     CHOLECYSTECTOMY     COLONOSCOPY     GASTROPLASTY     gatric stapling     LAPAROSCOPY N/A 01/30/2017   Procedure: LAPAROSCOPY DIAGNOSTIC WITH LAPAROSCOPIC LYSIS OF ADHESIONS;  Surgeon: Excell Seltzer, MD;  Location: [facility];  Service: General;  Laterality: N/A;   MIDDLE EAR SURGERY Right    NASAL SINUS SURGERY Left 08/06/2018   Procedure: Endoscopic SPHENOIDECTOMY;  Surgeon: Izora Gala, MD;  Location: [facility];  Service: ENT;  Laterality: Left;   revearsal of gastric stapling      TONSILLECTOMY     TOTAL KNEE ARTHROPLASTY     rt.   TUBAL LIGATION     TYMPANOPLASTY Right 10/22/2016   Procedure: RIGHT TYMPANOPLASTY;  Surgeon: Izora Gala, MD;  Location: [facility];  Service: ENT;  Laterality: Right;   TYMPANOPLASTY Right 09/02/2017   Procedure: REVISION RIGHT TYMPANOPLASTY;  Surgeon: Izora Gala, MD;  Location: [facility];  Service: ENT;  Laterality: Right;     MEDICATIONS:   Medications Prior to Admission  Medication Sig Dispense Refill Last Dose   ALPRAZolam (XANAX) 0.5 MG tablet Take 1 tablet (0.5 mg total) by mouth 2 (two) times daily as needed. (Patient taking differently: Take 0.25-0.5 mg by mouth 2 (two) times daily as needed for anxiety or sleep.) 60 tablet 3    aspirin 81 MG tablet Take 81 mg by mouth at bedtime.      bisacodyl (DULCOLAX) 5 MG EC tablet Take 5 mg by mouth daily as needed for moderate constipation.      buPROPion (WELLBUTRIN XL) 150 MG 24 hr tablet TAKE 1 TABLET BY MOUTH EVERY DAY 90 tablet 1    Cholecalciferol (VITAMIN D) 2000 UNITS CAPS Take 2,000 Units by mouth daily.      diclofenac Sodium (VOLTAREN) 1 % GEL Apply 1 Application topically 4 (four) times daily as needed (pain).      fluticasone (FLONASE) 50 MCG/ACT nasal spray Place 2 sprays into both nostrils daily. (Patient taking differently: Place 2 sprays into both nostrils daily as needed for allergies.) 48 g 3    gabapentin (NEURONTIN) 100 MG capsule Take 1 capsule (100 mg total) by mouth at bedtime. 90 capsule 3    HYDROcodone bit-homatropine (HYCODAN) 5-1.5 MG/5ML syrup Take 5 mLs by mouth every 6 (six) hours as needed for cough.      ibuprofen (ADVIL) 200 MG tablet Take 200 mg by mouth every 6 (six) hours as needed for moderate pain.      lovastatin (MEVACOR) 40 MG tablet Take 1 tablet (40 mg total) by mouth at bedtime. Annual appt due W/LABS must see provider for future refills 90 tablet 3    Melatonin 5 MG CAPS Take 5 mg by mouth at bedtime as needed  (sleep).      Multiple Vitamins-Minerals (PRESERVISION AREDS 2) CAPS Take 1 capsule by mouth daily.      omeprazole (PRILOSEC) 40 MG capsule Take 1 capsule (40 mg total) by mouth in the morning and at bedtime. 180 capsule 3    ondansetron (ZOFRAN) 4 MG tablet Take 1 tablet (4 mg total) by mouth every 8 (eight) hours as needed for nausea or vomiting. 20 tablet 0    solifenacin (VESICARE) 10 MG tablet TAKE 1 TABLET (10 MG TOTAL) BY MOUTH DAILY. ANNUAL APPT IS DUE MUST SEE PROVIDER FOR FUTURE REFILLS 90 tablet 3  verapamil (CALAN-SR) 240 MG CR tablet Take 1 tablet (240 mg total) by mouth at bedtime. 90 tablet 3    vitamin B-12 (CYANOCOBALAMIN) 500 MCG tablet Take 500 mcg by mouth daily.      HYDROcodone-acetaminophen (NORCO/VICODIN) 5-325 MG tablet Take 1 tablet by mouth every 6 (six) hours as needed for severe pain. (Patient not taking: Reported on 01/03/2022) 20 tablet 0 Completed Course    ALLERGIES:   Allergies  Allergen Reactions   Levaquin [Levofloxacin] Nausea Only   Other Nausea And Vomiting    General anesthesia    Omnicef [Cefdinir] Diarrhea and Nausea And Vomiting    REVIEW OF SYSTEMS:  A comprehensive review of systems was negative except for: Musculoskeletal: positive for bone pain and myalgias   FAMILY HISTORY:   Family History  Problem Relation Age of Onset   Emphysema Father    Heart disease Father    Arthritis Mother    Hypertension Mother        pulmonary   Heart disease Mother    Colon cancer Maternal Aunt    Diabetes Sister    Lung cancer Sister    Colon cancer Maternal Uncle    Colon cancer Other        nephew   Stomach cancer Other    Diabetes Maternal Aunt        x 4   Diabetes Maternal Uncle        x 3   Diabetes Paternal Uncle         1   Kidney disease Other        niece   Diabetes Sister    COPD Sister    Heart Problems Daughter    Diabetes Daughter    Breast cancer Paternal Aunt 45   Esophageal cancer Neg Hx    Liver disease Neg Hx     Rectal cancer Neg Hx     SOCIAL HISTORY:   Social History   Tobacco Use   Smoking status: Never   Smokeless tobacco: Never  Substance Use Topics   Alcohol use: No     EXAMINATION:  Vital signs in last 24 hours:    There were no vitals taken for this visit.  General Appearance:    Alert, cooperative, no distress, appears stated age  Head:    Normocephalic, without obvious abnormality, atraumatic  Eyes:    PERRL, conjunctiva/corneas clear, EOM's intact, fundi    benign, both eyes  Ears:    Normal TM's and external ear canals, both ears  Nose:   Nares normal, septum midline, mucosa normal, no drainage    or sinus tenderness  Throat:   Lips, mucosa, and tongue normal; teeth and gums normal  Neck:   Supple, symmetrical, trachea midline, no adenopathy;    thyroid:  no enlargement/tenderness/nodules; no carotid   bruit or JVD  Back:     Symmetric, no curvature, ROM normal, no CVA tenderness  Lungs:     Clear to auscultation bilaterally, respirations unlabored  Chest Wall:    No tenderness or deformity   Heart:    Regular rate and rhythm, S1 and S2 normal, no murmur, rub   or gallop  Breast Exam:    No tenderness, masses, or nipple abnormality  Abdomen:     Soft, non-tender, bowel sounds active all four quadrants,    no masses, no organomegaly  Genitalia:    Normal female without lesion, discharge or tenderness  Rectal:    Normal tone, no masses  or tenderness;   guaiac negative stool  Extremities:   Extremities normal, atraumatic, no cyanosis or edema  Pulses:   2+ and symmetric all extremities  Skin:   Skin color, texture, turgor normal, no rashes or lesions  Lymph nodes:   Cervical, supraclavicular, and axillary nodes normal  Neurologic:   CNII-XII intact, normal strength, sensation and reflexes    throughout    Musculoskeletal:  ROM 0-100, Ligaments intact,  Imaging Review Plain radiographs demonstrate loosening/failed tka of the right knee. The overall alignment is mild  varus. The bone quality appears to be good for age and reported activity level.  Assessment/Plan: Failed TKA right knee   The patient history, physical examination and imaging studies are consistent with failed TKA of the right knee. The patient has failed conservative treatment.  The clearance notes were reviewed.  After discussion with the patient it was felt that Total Knee Revision was indicated. The procedure,  risks, and benefits  were presented and reviewed. The risks including but not limited to aseptic loosening, infection, blood clots, vascular injury, stiffness, patella tracking problems complications among others were discussed. The patient acknowledged the explanation, agreed to proceed with the plan.  Preoperative templating of the joint replacement has been completed, documented, and submitted to the Operating Room personnel in order to optimize intra-operative equipment management.    Patient's anticipated LOS is less than 2 midnights, meeting these requirements: - Lives within 1 hour of care - Has a competent adult at home to recover with post-op recover - NO history of  - Chronic pain requiring opiods  - Diabetes  - Coronary Artery Disease  - Heart failure  - Heart attack  - Stroke  - DVT/VTE  - Cardiac arrhythmia  - Respiratory Failure/COPD  - Renal failure  - Anemia  - Advanced Liver disease     Donia Ast 01/22/2022, 9:05 AM

## 2022-01-23 ENCOUNTER — Encounter (HOSPITAL_COMMUNITY): Payer: Self-pay | Admitting: Orthopedic Surgery

## 2022-01-23 ENCOUNTER — Other Ambulatory Visit: Payer: Self-pay

## 2022-01-23 NOTE — Progress Notes (Signed)
Orthopedic Tech Progress Note Patient Details:  [name] 05-13-1946 [id number]  Patient ID: [name], female   DOB: 01-Nov-1945, 76 y.o.   MRN: [id number]  [name] 01/23/2022, 5:05 PM Cpm removed

## 2022-01-23 NOTE — Progress Notes (Signed)
Physical Therapy Treatment Patient Details Name: [name] MRN: [id number] DOB: 10/07/1945 Today's Date: 01/23/2022   History of Present Illness Pt is a 76yo female presenting s/p R-TKR on 8.7.23 PMH: Dsyrhythmia, GERD, fibromyalgia, HTN, R-TKA.    PT Comments    POD # 1 am session very limited General Comments: AxO x 3 very sweet lady but slightly sleepy/groggy and required 50% repeat VC's to complete/stay on task. General bed mobility comments: demonstarted and instructed on how to use belt to self asisst LE off bed, required increased time as well with mod c/o dizziness as well as nausea with activity. General transfer comment: pt was only able to tolerate OOB to [facility] then to recliner due to increased c/o nausea as well as dizziness with activity. General Gait Details: transfers only this session due to increased c/o nausea/dizziness.  BP soft at 115/49.  Will allow pt to sit in recliner and return to attempt more activity.   Recommendations for follow up therapy are one component of a multi-disciplinary discharge planning process, led by the attending physician.  Recommendations may be updated based on patient status, additional functional criteria and insurance authorization.  Follow Up Recommendations  Follow physician's recommendations for discharge plan and follow up therapies     Assistance Recommended at Discharge Intermittent Supervision/Assistance  Patient can return home with the following A little help with walking and/or transfers;Two people to help with walking and/or transfers;Assistance with cooking/housework;Assist for transportation;Help with stairs or ramp for entrance   Equipment Recommendations  None recommended by PT    Recommendations for Other Services       Precautions / Restrictions Precautions Precautions: Fall Restrictions Weight Bearing Restrictions: No Other Position/Activity Restrictions: WBAT     Mobility  Bed Mobility Overal bed mobility:  Needs Assistance Bed Mobility: Supine to Sit     Supine to sit: Min assist     General bed mobility comments: demonstarted and instructed on how to use belt to self asisst LE off bed, required increased time as well with mod c/o dizziness as well as nausea with activity.    Transfers Overall transfer level: Needs assistance Equipment used: Rolling walker (2 wheels) Transfers: Bed to chair/wheelchair/BSC   Stand pivot transfers: Mod assist         General transfer comment: pt was only able to tolerate OOB to [facility] then to recliner due to increased c/o nausea as well as dizziness with activity.    Ambulation/Gait               General Gait Details: transfers only this session due to increased c/o nausea/dizziness.  BP sodt at 115/49.   Stairs             Wheelchair Mobility    Modified Rankin (Stroke Patients Only)       Balance                                            Cognition Arousal/Alertness: Awake/alert Behavior During Therapy: WFL for tasks assessed/performed Overall Cognitive Status: Within Functional Limits for tasks assessed                                 General Comments: AxO x 3 very sweet lady but slightly sleepy/groggy and required 50% repeat VC's to complete/stay on task.  Exercises      General Comments        Pertinent Vitals/Pain Pain Assessment Pain Assessment: 0-10 Pain Score: 9  Pain Location: right knee Pain Descriptors / Indicators: Operative site guarding, Guarding, Tender, Throbbing Pain Intervention(s): Monitored during session, Premedicated before session, Repositioned, Ice applied    Home Living                          Prior Function            PT Goals (current goals can now be found in the care plan section) Progress towards PT goals: Progressing toward goals    Frequency    7X/week      PT Plan Current plan remains appropriate     Co-evaluation              AM-PAC PT "6 Clicks" Mobility   Outcome Measure  Help needed turning from your back to your side while in a flat bed without using bedrails?: A Lot Help needed moving from lying on your back to sitting on the side of a flat bed without using bedrails?: A Lot Help needed moving to and from a bed to a chair (including a wheelchair)?: A Lot Help needed standing up from a chair using your arms (e.g., wheelchair or bedside chair)?: A Lot Help needed to walk in hospital room?: A Lot Help needed climbing 3-5 steps with a railing? : A Lot 6 Click Score: 12    End of Session Equipment Utilized During Treatment: Gait belt Activity Tolerance: Patient limited by fatigue;Other (comment) (nausea/dizziness) Patient left: in chair;with call bell/phone within reach;with chair alarm set Nurse Communication: Mobility status (reported to NT pt voided in [facility]) PT Visit Diagnosis: Pain;Difficulty in walking, not elsewhere classified (R26.2) Pain - Right/Left: Right Pain - part of body: Knee     Time: 0370-4888 PT Time Calculation (min) (ACUTE ONLY): 18 min  Charges:  $Therapeutic Activity: 8-22 mins                     Rica Koyanagi  PTA [place] Office M-F          [phone] Weekend pager [phone]

## 2022-01-23 NOTE — Plan of Care (Signed)
  Problem: Activity: Goal: Risk for activity intolerance will decrease Outcome: Progressing   Problem: Pain Managment: Goal: General experience of comfort will improve Outcome: Progressing   Problem: Safety: Goal: Ability to remain free from injury will improve Outcome: Progressing   

## 2022-01-23 NOTE — TOC Transition Note (Signed)
Transition of Care [facility]) - CM/SW Discharge Note  Patient Details  Name: [name] MRN: [id number] Date of Birth: [birth year]  Transition of Care [facility]) CM/SW Contact:  Sherie Don, LCSW Phone Number: 01/23/2022, 10:45 AM  Clinical Narrative: Patient is expected to discharge home after working with PT. CSW met with patient to confirm discharge plan. Patient will go home with OPPT, but patient cannot remember the name of the location. Patient was set up with a rolling walker and CPM prior to surgery, so there are no DME needs at this time. TOC signing off.  Final next level of care: OP Rehab Barriers to Discharge: No Barriers Identified  Patient Goals and CMS Choice Patient states their goals for this hospitalization and ongoing recovery are:: Go home with OPPT Choice offered to / list presented to : NA  Discharge Plan and Services        DME Arranged: N/A DME Agency: NA  Readmission Risk Interventions     No data to display

## 2022-01-23 NOTE — Progress Notes (Signed)
SPORTS MEDICINE AND JOINT REPLACEMENT  Lara Mulch, MD    Carlyon Shadow, PA-C [address], [place], [place]  [address]                             [phone]   PROGRESS NOTE  Subjective:  negative for Chest Pain  negative for Shortness of Breath  negative for Nausea/Vomiting   negative for Calf Pain  negative for Bowel Movement   Tolerating Diet: yes         Patient reports pain as 5 on 0-10 scale.    Objective: Vital signs in last 24 hours:   Patient Vitals for the past 24 hrs:  BP Temp Temp src Pulse Resp SpO2 Height Weight  01/23/22 1049 (!) 123/46 97.8 F (36.6 C) Oral 78 18 97 % -- --  01/23/22 0623 (!) 102/43 97.6 F (36.4 C) Oral 74 17 91 % -- --  01/23/22 0141 (!) 99/45 97.6 F (36.4 C) Oral 72 17 96 % -- --  01/22/22 2158 116/62 97.9 F (36.6 C) -- 67 17 100 % -- --  01/22/22 1905 -- -- -- -- -- -- 5' 3.5" (1.613 m) 81.3 kg  01/22/22 1824 (!) 94/52 (P) 97.6 F (36.4 C) -- 80 -- 91 % -- --  01/22/22 1527 103/60 97.6 F (36.4 C) -- 76 14 100 % -- --  01/22/22 1515 (!) 102/54 (!) 97.3 F (36.3 C) -- 66 10 95 % -- --  01/22/22 1500 (!) 104/59 -- -- 68 14 94 % -- --  01/22/22 1445 (!) 107/51 -- -- 71 20 93 % -- --  01/22/22 1430 (!) 106/57 -- -- 72 10 96 % -- --  01/22/22 1415 109/60 -- -- 80 15 100 % -- --  01/22/22 1400 110/62 -- -- 82 14 100 % -- --  01/22/22 1358 (!) 112/53 (!) 97.4 F (36.3 C) -- 82 14 100 % -- --    '@flow'$ {1959:LAST@   Intake/Output from previous day:   08/07 0701 - 08/08 0700 In: 2725.9 [P.O.:720; I.V.:2005.9] Out: 975 [Urine:950]   Intake/Output this shift:   08/08 0701 - 08/08 1900 In: -  Out: 1 [Urine:1]   Intake/Output      08/07 0701 08/08 0700 08/08 0701 08/09 0700   P.O. 720    I.V. (mL/kg) 2005.9 (24.7)    IV Piggyback 0    Total Intake(mL/kg) 2725.9 (33.5)    Urine (mL/kg/hr) 950 1 (0)   Blood 25    Total Output 975 1   Net +1750.9 -1           LABORATORY DATA: No results for input(s): "WBC",  "HGB", "HCT", "PLT" in the last 168 hours. No results for input(s): "NA", "K", "CL", "CO2", "BUN", "CREATININE", "GLUCOSE", "CALCIUM" in the last 168 hours. Lab Results  Component Value Date   INR 1.1 08/31/2021   INR 0.99 01/26/2017   INR 0.8 03/13/2007    Examination:  General appearance: alert, cooperative, and no distress Extremities: extremities normal, atraumatic, no cyanosis or edema  Wound Exam: clean, dry, intact   Drainage:  None: wound tissue dry  Motor Exam: Quadriceps and Hamstrings Intact  Sensory Exam: Superficial Peroneal, Deep Peroneal, and Tibial normal   Assessment:    1 Day Post-Op  Procedure(s) (LRB): TOTAL KNEE REVISION (Right)  ADDITIONAL DIAGNOSIS:  Principal Problem:   S/P revision of total knee     Plan: Physical Therapy as  ordered Weight Bearing as Tolerated (WBAT)  DVT Prophylaxis:  Aspirin  DISCHARGE PLAN: Home  Patient will need 1 more night to work on pain control and PT      Anticipated LOS equal to or greater than 2 midnights due to - Age 2 and older with one or more of the following:  - Obesity  - Expected need for hospital services (PT, OT, Nursing) required for safe  discharge  - Anticipated need for postoperative skilled nursing care or inpatient rehab    Donia Ast 01/23/2022, 12:48 PM

## 2022-01-23 NOTE — Progress Notes (Signed)
Physical Therapy Treatment Patient Details Name: [name] MRN: [id number] DOB: Oct 21, 1945 Today's Date: 01/23/2022   History of Present Illness Pt is a 76yo female presenting s/p R-TKR on 8.7.23 PMH: Dsyrhythmia, GERD, fibromyalgia, HTN, R-TKA.    PT Comments    POD # 1 pm session Pt still in recliner "tired".  General transfer comment: 50% VC's on proper hand placement and increased time to rise from recliner. Also asissted with a toilet transfer.  General Gait Details: pt tolerated amb a total 25 feet to and from bathroom.  Limited by fatigue and effort. Progressing slowly. General bed mobility comments: assisted back to bed at [place] to support LE .  Then returned to room to perform some TE's following HEP handout.  Instructed on proper tech, freq as well as use of ICE.    Pt did NOT meet her mobility goals to safely D/C to home today.    Recommendations for follow up therapy are one component of a multi-disciplinary discharge planning process, led by the attending physician.  Recommendations may be updated based on patient status, additional functional criteria and insurance authorization.  Follow Up Recommendations  Follow physician's recommendations for discharge plan and follow up therapies     Assistance Recommended at Discharge Intermittent Supervision/Assistance  Patient can return home with the following A little help with walking and/or transfers;Two people to help with walking and/or transfers;Assistance with cooking/housework;Assist for transportation;Help with stairs or ramp for entrance   Equipment Recommendations  None recommended by PT    Recommendations for Other Services       Precautions / Restrictions Precautions Precautions: Fall Restrictions Weight Bearing Restrictions: No Other Position/Activity Restrictions: WBAT     Mobility  Bed Mobility Overal bed mobility: Needs Assistance Bed Mobility: Sit to Supine     Supine to sit: Min assist Sit to  supine: Min assist   General bed mobility comments: assisted back to bed at [place] to support LE    Transfers Overall transfer level: Needs assistance Equipment used: Rolling walker (2 wheels) Transfers: Sit to/from Stand Sit to Stand: Min guard, Min assist Stand pivot transfers: Mod assist         General transfer comment: 50% VC's on proper hand placement and increased time to rise from recliner. Also asissted with a toilet transfer.    Ambulation/Gait Ambulation/Gait assistance: Min assist, Min guard Gait Distance (Feet): 25 Feet Assistive device: Rolling walker (2 wheels) Gait Pattern/deviations: Step-to pattern, Decreased stance time - right Gait velocity: decreased     General Gait Details: pt tolerated amb a total 25 feet to and from bathroom.  Limited by fatigue and effort. Progressing slowly.   Stairs             Wheelchair Mobility    Modified Rankin (Stroke Patients Only)       Balance                                            Cognition Arousal/Alertness: Awake/alert Behavior During Therapy: WFL for tasks assessed/performed Overall Cognitive Status: Within Functional Limits for tasks assessed                                 General Comments: AxO x 3 very sweet lady but slightly sleepy/groggy and required 50% repeat VC's to  complete/stay on task.        Exercises  Total Knee Replacement TE's following HEP handout 10 reps B LE ankle pumps 05 reps towel squeezes 05 reps knee presses 05 reps heel slides  05 reps SAQ's 05 reps SLR's 05 reps ABD Educated on use of gait belt to assist with TE's Followed by ICE     General Comments        Pertinent Vitals/Pain Pain Assessment Pain Assessment: 0-10 Pain Score: 9  Pain Location: right knee Pain Descriptors / Indicators: Operative site guarding, Guarding, Tender, Throbbing Pain Intervention(s): Monitored during session, Premedicated before session,  Repositioned, Ice applied    Home Living                          Prior Function            PT Goals (current goals can now be found in the care plan section) Progress towards PT goals: Progressing toward goals    Frequency    7X/week      PT Plan Current plan remains appropriate    Co-evaluation              AM-PAC PT "6 Clicks" Mobility   Outcome Measure  Help needed turning from your back to your side while in a flat bed without using bedrails?: A Lot Help needed moving from lying on your back to sitting on the side of a flat bed without using bedrails?: A Lot Help needed moving to and from a bed to a chair (including a wheelchair)?: A Lot Help needed standing up from a chair using your arms (e.g., wheelchair or bedside chair)?: A Lot Help needed to walk in hospital room?: A Lot Help needed climbing 3-5 steps with a railing? : A Lot 6 Click Score: 12    End of Session Equipment Utilized During Treatment: Gait belt Activity Tolerance: Patient limited by fatigue;Other (comment) (nausea/dizziness) Patient left: in bed;with bed alarm set Nurse Communication: Mobility status (reported to NT pt voided in [facility]) PT Visit Diagnosis: Pain;Difficulty in walking, not elsewhere classified (R26.2) Pain - Right/Left: Right Pain - part of body: Knee     Time: 0093-8182 PT Time Calculation (min) (ACUTE ONLY): 26 min  Charges:  $Gait Training: 8-22 mins $Therapeutic Exercise: 8-22 mins                     Rica Koyanagi  PTA [place] Office M-F          [phone] Weekend pager [phone]

## 2022-01-23 NOTE — Progress Notes (Signed)
Orthopedic Tech Progress Note Patient Details:  [name] 18-Apr-1946 [id number]  Patient ID: [name], female   DOB: 08-05-1945, 76 y.o.   MRN: [id number]  [name] 01/23/2022, 3:08 PM Patient placed in cpm

## 2022-01-23 NOTE — Progress Notes (Signed)
Physical Therapy Treatment Patient Details Name: [name] MRN: [id number] DOB: January 19, 1946 Today's Date: 01/23/2022   History of Present Illness Pt is a 76yo female presenting s/p R-TKR on 8.7.23 PMH: Dsyrhythmia, GERD, fibromyalgia, HTN, R-TKA.    PT Comments    POD # 1 am session part 2 Pt tolerated amb 12 feet then performed a few TE's.   General transfer comment: 50% VC's on proper hand placement and increased time to rise from recliner.  decreased c/o nausea this session.  Slow.  General Gait Details: pt was able to progress her gait to 12 feet with much effort and 50% VC's on proper sequencing as well as upright posture.  Recliner following for safety and to decrease pt's anxiety. Then returned to room to perform some TE's following HEP handout.  Instructed on proper tech, freq as well as use of ICE.   Will see pt again this afternoon.  Do NOT expect pt will meet safe mobility goals to D/C to home today.  Will need another day.   Recommendations for follow up therapy are one component of a multi-disciplinary discharge planning process, led by the attending physician.  Recommendations may be updated based on patient status, additional functional criteria and insurance authorization.  Follow Up Recommendations  Follow physician's recommendations for discharge plan and follow up therapies     Assistance Recommended at Discharge Intermittent Supervision/Assistance  Patient can return home with the following A little help with walking and/or transfers;Two people to help with walking and/or transfers;Assistance with cooking/housework;Assist for transportation;Help with stairs or ramp for entrance   Equipment Recommendations  None recommended by PT    Recommendations for Other Services       Precautions / Restrictions Precautions Precautions: Fall Restrictions Weight Bearing Restrictions: No Other Position/Activity Restrictions: WBAT     Mobility  Bed Mobility Overal bed  mobility: Needs Assistance Bed Mobility: Supine to Sit     Supine to sit: Min assist     General bed mobility comments: OOB in recliner    Transfers Overall transfer level: Needs assistance Equipment used: Rolling walker (2 wheels) Transfers: Sit to/from Stand Sit to Stand: Min guard, From elevated surface, Min assist Stand pivot transfers: Mod assist         General transfer comment: 50% VC's on proper hand placement and increased time to rise from recliner.  decreased c/o nausea this session.  Slow.    Ambulation/Gait Ambulation/Gait assistance: Min assist, Min guard Gait Distance (Feet): 12 Feet Assistive device: Rolling walker (2 wheels) Gait Pattern/deviations: Step-to pattern, Decreased stance time - right Gait velocity: decreased     General Gait Details: pt was able to progress her gait to 12 feet with much effort and 50% VC's on proper sequencing as well as upright posture.  Recliner following for safety and to decrease pt's anxiety.   Stairs             Wheelchair Mobility    Modified Rankin (Stroke Patients Only)       Balance                                            Cognition Arousal/Alertness: Awake/alert Behavior During Therapy: WFL for tasks assessed/performed Overall Cognitive Status: Within Functional Limits for tasks assessed  General Comments: AxO x 3 very sweet lady but slightly sleepy/groggy and required 50% repeat VC's to complete/stay on task.        Exercises  Total Knee Replacement TE's following HEP handout 10 reps B LE ankle pumps 05 reps towel squeezes 05 reps knee presses 05 reps heel slides  05 reps SAQ's 05 reps SLR's 05 reps ABD Educated on use of gait belt to assist with TE's Followed by ICE     General Comments        Pertinent Vitals/Pain Pain Assessment Pain Assessment: 0-10 Pain Score: 9  Pain Location: right knee Pain Descriptors /  Indicators: Operative site guarding, Guarding, Tender, Throbbing Pain Intervention(s): Monitored during session, Premedicated before session, Repositioned, Ice applied    Home Living                          Prior Function            PT Goals (current goals can now be found in the care plan section) Progress towards PT goals: Progressing toward goals    Frequency    7X/week      PT Plan Current plan remains appropriate    Co-evaluation              AM-PAC PT "6 Clicks" Mobility   Outcome Measure  Help needed turning from your back to your side while in a flat bed without using bedrails?: A Lot Help needed moving from lying on your back to sitting on the side of a flat bed without using bedrails?: A Lot Help needed moving to and from a bed to a chair (including a wheelchair)?: A Lot Help needed standing up from a chair using your arms (e.g., wheelchair or bedside chair)?: A Lot Help needed to walk in hospital room?: A Lot Help needed climbing 3-5 steps with a railing? : A Lot 6 Click Score: 12    End of Session Equipment Utilized During Treatment: Gait belt Activity Tolerance: Patient limited by fatigue;Other (comment) (nausea/dizziness) Patient left: in chair;with call bell/phone within reach;with chair alarm set Nurse Communication: Mobility status (reported to NT pt voided in [facility]) PT Visit Diagnosis: Pain;Difficulty in walking, not elsewhere classified (R26.2) Pain - Right/Left: Right Pain - part of body: Knee     Time: 7482-7078 PT Time Calculation (min) (ACUTE ONLY): 25 min  Charges:  $Gait Training: 8-22 mins $Therapeutic Exercise: 8-22 mins                      Rica Koyanagi  PTA [place] [facility] M-F          [phone] Weekend pager [phone]

## 2022-01-24 NOTE — Progress Notes (Signed)
Physical Therapy Treatment Patient Details Name: [name] MRN: [id number] DOB: 04-Mar-1946 Today's Date: 01/24/2022   History of Present Illness Pt is a 76yo female presenting s/p R-TKR on 8.7.23 PMH: Dsyrhythmia, GERD, fibromyalgia, HTN, R-TKA.    PT Comments    POD # 2  Pt NOT progressing as she should with issues of PAIN control along with NAUSEA on exertion then triggers anxiety. Pt was unable to amb in hallway to attempt stair training.   General transfer comment: had Daughter "hands ona' assist with all transfer but pt was limited by pain and increased anxiety. General bed mobility comments: assisted back to bed and positioned to comfort. Called Ortho Tech to have pt put in CPM. Pt did not meet goals to safely D/C to home today.   Recommendations for follow up therapy are one component of a multi-disciplinary discharge planning process, led by the attending physician.  Recommendations may be updated based on patient status, additional functional criteria and insurance authorization.  Follow Up Recommendations  Follow physician's recommendations for discharge plan and follow up therapies     Assistance Recommended at Discharge Intermittent Supervision/Assistance  Patient can return home with the following A little help with walking and/or transfers;Two people to help with walking and/or transfers;Assistance with cooking/housework;Assist for transportation;Help with stairs or ramp for entrance   Equipment Recommendations  None recommended by PT    Recommendations for Other Services       Precautions / Restrictions Precautions Precautions: Fall Restrictions Weight Bearing Restrictions: No Other Position/Activity Restrictions: WBAT     Mobility  Bed Mobility Overal bed mobility: Needs Assistance Bed Mobility: Sit to Supine     Supine to sit: Min guard Sit to supine: Min assist   General bed mobility comments: assisted back to bed and positioned to comfort. Patient  Response: Anxious  Transfers Overall transfer level: Needs assistance Equipment used: Rolling walker (2 wheels) Transfers: Sit to/from Stand, Bed to chair/wheelchair/BSC Sit to Stand: Supervision, Min guard Stand pivot transfers: Supervision, Min guard         General transfer comment: had Daughter "hands ona' assist with all transfer but pt was limited by pain and increased anxiety.    Ambulation/Gait Ambulation/Gait assistance: Supervision, Min guard Gait Distance (Feet): 20 Feet Assistive device: Rolling walker (2 wheels) Gait Pattern/deviations: Step-to pattern, Decreased stance time - right Gait velocity: decreased     General Gait Details: unable to amb due to uncontrolled pain along with nausea then triggered increased anxiety.   Stairs             Wheelchair Mobility    Modified Rankin (Stroke Patients Only)       Balance                                            Cognition Arousal/Alertness: Awake/alert Behavior During Therapy: WFL for tasks assessed/performed Overall Cognitive Status: Within Functional Limits for tasks assessed                                 General Comments: AxO x 3 very sweet lady but still c/o nausea as well as increased pain.  With both of these has increased anxiety.        Exercises      General Comments  Pertinent Vitals/Pain Pain Assessment Pain Assessment: 0-10 Pain Score: 9  Pain Location: right knee Pain Descriptors / Indicators: Operative site guarding, Guarding, Tender, Throbbing, Grimacing Pain Intervention(s): Monitored during session, Repositioned, Patient requesting pain meds-RN notified, Ice applied    Home Living                          Prior Function            PT Goals (current goals can now be found in the care plan section) Progress towards PT goals: Progressing toward goals    Frequency    7X/week      PT Plan Current plan  remains appropriate    Co-evaluation              AM-PAC PT "6 Clicks" Mobility   Outcome Measure  Help needed turning from your back to your side while in a flat bed without using bedrails?: A Little Help needed moving from lying on your back to sitting on the side of a flat bed without using bedrails?: A Little Help needed moving to and from a bed to a chair (including a wheelchair)?: A Little Help needed standing up from a chair using your arms (e.g., wheelchair or bedside chair)?: A Little Help needed to walk in hospital room?: A Lot Help needed climbing 3-5 steps with a railing? : Total 6 Click Score: 15    End of Session Equipment Utilized During Treatment: Gait belt Activity Tolerance: Patient limited by fatigue;Patient limited by pain;Other (comment) (nausea/anxiety) Patient left: in bed;with bed alarm set;with call bell/phone within reach;with family/visitor present Nurse Communication: Mobility status PT Visit Diagnosis: Pain;Difficulty in walking, not elsewhere classified (R26.2) Pain - Right/Left: Right Pain - part of body: Knee     Time: 1416-1440 PT Time Calculation (min) (ACUTE ONLY): 24 min  Charges:  $Gait Training: 8-22 mins $Therapeutic Activity: 23-37 mins                     Rica Koyanagi  PTA [place] [facility] M-F          [phone] Weekend pager [phone]

## 2022-01-24 NOTE — Progress Notes (Signed)
Physical Therapy Treatment Patient Details Name: [name] MRN: [id number] DOB: 06-18-1946 Today's Date: 01/24/2022   History of Present Illness Pt is a 76yo female presenting s/p R-TKR on 8.7.23 PMH: Dsyrhythmia, GERD, fibromyalgia, HTN, R-TKA.    PT Comments    POD # 2 Pt NOT progressing with issues of nausea and now Orthostatic.   General transfer comment: Had Daughter "hands on" asisst pt out of recliner to bathroom and assist pt with toilet transfer.  Instructed to use safety belt as well as safe handling. General Gait Details: had Daughter "hands on" asisst pt to amb to bathroom 10 feet and out od bathroom another 10.  Distance limited by tolerance as well as pt becoming pale/weak.  Standing BP was 105/37 with HR 122.  RN in room at time.   Recommendations for follow up therapy are one component of a multi-disciplinary discharge planning process, led by the attending physician.  Recommendations may be updated based on patient status, additional functional criteria and insurance authorization.  Follow Up Recommendations  Follow physician's recommendations for discharge plan and follow up therapies     Assistance Recommended at Discharge Intermittent Supervision/Assistance  Patient can return home with the following A little help with walking and/or transfers;Two people to help with walking and/or transfers;Assistance with cooking/housework;Assist for transportation;Help with stairs or ramp for entrance   Equipment Recommendations  None recommended by PT    Recommendations for Other Services       Precautions / Restrictions Precautions Precautions: Fall Restrictions Weight Bearing Restrictions: No Other Position/Activity Restrictions: WBAT     Mobility  Bed Mobility   General bed mobility comments: OOB in recliner Patient Response: Anxious  Transfers Overall transfer level: Needs assistance Equipment used: Rolling walker (2 wheels) Transfers: Sit to/from  Stand Sit to Stand: Supervision, Min guard           General transfer comment: Had Daughter "hands on" asisst pt out of recliner to bathroom and assist pt with toilet transfer.  Instructed to use safety belt as well as safe handling.    Ambulation/Gait Ambulation/Gait assistance: Supervision, Min guard Gait Distance (Feet): 20 Feet Assistive device: Rolling walker (2 wheels) Gait Pattern/deviations: Step-to pattern, Decreased stance time - right Gait velocity: decreased     General Gait Details: had Daughter "hands on" asisst pt to amb to bathroom 10 feet and out od bathroom another 10.  Distance limited by tolerance as well as pt becoming pale/weak.  Standing BP was 105/37 with HR 122.  RN in room at time.   Stairs             Wheelchair Mobility    Modified Rankin (Stroke Patients Only)       Balance                                            Cognition Arousal/Alertness: Awake/alert Behavior During Therapy: WFL for tasks assessed/performed Overall Cognitive Status: Within Functional Limits for tasks assessed                                 General Comments: AxO x 3 very sweet lady but still c/o nausea as well as increased pain.        Exercises      General Comments  Pertinent Vitals/Pain Pain Assessment Pain Assessment: 0-10 Pain Score: 8  Pain Location: right knee Pain Descriptors / Indicators: Operative site guarding, Guarding, Tender, Throbbing, Grimacing Pain Intervention(s): Monitored during session, Premedicated before session, Repositioned, Ice applied    Home Living                          Prior Function            PT Goals (current goals can now be found in the care plan section) Progress towards PT goals: Progressing toward goals    Frequency    7X/week      PT Plan Current plan remains appropriate    Co-evaluation              AM-PAC PT "6 Clicks" Mobility    Outcome Measure  Help needed turning from your back to your side while in a flat bed without using bedrails?: A Little Help needed moving from lying on your back to sitting on the side of a flat bed without using bedrails?: A Little Help needed moving to and from a bed to a chair (including a wheelchair)?: A Little Help needed standing up from a chair using your arms (e.g., wheelchair or bedside chair)?: A Little Help needed to walk in hospital room?: A Little Help needed climbing 3-5 steps with a railing? : A Lot 6 Click Score: 17    End of Session Equipment Utilized During Treatment: Gait belt Activity Tolerance: Other (comment) (orthostatic) Patient left: in chair;with call bell/phone within reach;with chair alarm set Nurse Communication: Mobility status PT Visit Diagnosis: Pain;Difficulty in walking, not elsewhere classified (R26.2) Pain - Right/Left: Right Pain - part of body: Knee     Time: 1136-1200 PT Time Calculation (min) (ACUTE ONLY): 24 min  Charges:  $Gait Training: 8-22 mins $Therapeutic Activity: 8-22 mins                     Rica Koyanagi  PTA [place] [facility] M-F          [phone] Weekend pager [phone]

## 2022-01-24 NOTE — Progress Notes (Signed)
Physical Therapy Treatment Patient Details Name: [name] MRN: [id number] DOB: 1946-06-09 Today's Date: 01/24/2022   History of Present Illness Pt is a 76yo female presenting s/p R-TKR on 8.7.23 PMH: Dsyrhythmia, GERD, fibromyalgia, HTN, R-TKA.    PT Comments    POD # 2 am session Pt only tolerated OOB to [facility] then to recliner this session due to increased c/o pain plus nausea.  General bed mobility comments: using her belt to self assist LE and increased time to scoot to EOB at [facility].  General transfer comment: 50% VC's on proper hand placement pt was able to complete 1/4 turn from elevated bed to [facility] 1/4 turn to her LEFT.  Same fashion off BSC 1/4 turn to her RIGHT with good use of hands to support self.  NO AD was used for this pivot transfer.  Sit to stand, pt did uyse walker to take a few steps to recliner. General Gait Details: only tolerated a few pivot steps with walker from EOB to recliner with 25% VC's on safety with turn completion and back steps.  Pt unable to toolerate much activity due to increased c/o pain and nausea.  Pt will need another session or 2 to increase her mobility and has yet to practice stairs. Pt plans to return home with family.   Recommendations for follow up therapy are one component of a multi-disciplinary discharge planning process, led by the attending physician.  Recommendations may be updated based on patient status, additional functional criteria and insurance authorization.  Follow Up Recommendations  Follow physician's recommendations for discharge plan and follow up therapies     Assistance Recommended at Discharge Intermittent Supervision/Assistance  Patient can return home with the following A little help with walking and/or transfers;Two people to help with walking and/or transfers;Assistance with cooking/housework;Assist for transportation;Help with stairs or ramp for entrance   Equipment Recommendations  None recommended by PT     Recommendations for Other Services       Precautions / Restrictions Precautions Precautions: Fall Restrictions Weight Bearing Restrictions: No Other Position/Activity Restrictions: WBAT     Mobility  Bed Mobility Overal bed mobility: Needs Assistance Bed Mobility: Supine to Sit     Supine to sit: Min guard     General bed mobility comments: using her belt to self assist LE and increased time to scoot to EOB at [place] Patient Response: Anxious  Transfers Overall transfer level: Needs assistance Equipment used: Rolling walker (2 wheels) Transfers: Sit to/from Stand Sit to Stand: Supervision, Min guard           General transfer comment: 50% VC's on proper hand placement pt was able to complete 1/4 turn from elevated bed to [facility] 1/4 turn to her LEFT.  Same fashion off BSC 1/4 turn to her RIGHT with good use of hands to support self.  NO AD was used for this pivot transfer.  Sit to stand, pt did uyse walker to take a few steps to recliner.    Ambulation/Gait Ambulation/Gait assistance: Supervision, Min guard Gait Distance (Feet): 2 Feet Assistive device: Rolling walker (2 wheels) Gait Pattern/deviations: Step-to pattern, Decreased stance time - right Gait velocity: decreased     General Gait Details: only tolerated a few pivot steps with walker from EOB to recliner with 25% VC's on safety with turn completion and back steps.  Pt unable to toolerate much activity due to increased c/o pain and nausea.   Stairs  Wheelchair Mobility    Modified Rankin (Stroke Patients Only)       Balance                                            Cognition Arousal/Alertness: Awake/alert Behavior During Therapy: WFL for tasks assessed/performed Overall Cognitive Status: Within Functional Limits for tasks assessed                                 General Comments: AxO x 3 very sweet lady but still c/o nausea as well as  increased pain.        Exercises      General Comments        Pertinent Vitals/Pain Pain Assessment Pain Assessment: 0-10 Pain Score: 8  Pain Location: right knee Pain Descriptors / Indicators: Operative site guarding, Guarding, Tender, Throbbing, Grimacing Pain Intervention(s): Monitored during session, Premedicated before session, Repositioned, Ice applied    Home Living                          Prior Function            PT Goals (current goals can now be found in the care plan section) Progress towards PT goals: Progressing toward goals    Frequency    7X/week      PT Plan Current plan remains appropriate    Co-evaluation              AM-PAC PT "6 Clicks" Mobility   Outcome Measure  Help needed turning from your back to your side while in a flat bed without using bedrails?: A Little Help needed moving from lying on your back to sitting on the side of a flat bed without using bedrails?: A Little Help needed moving to and from a bed to a chair (including a wheelchair)?: A Little Help needed standing up from a chair using your arms (e.g., wheelchair or bedside chair)?: A Little Help needed to walk in hospital room?: A Lot Help needed climbing 3-5 steps with a railing? : Total 6 Click Score: 15    End of Session Equipment Utilized During Treatment: Gait belt Activity Tolerance: Patient limited by pain;Other (comment) (nausea) Patient left: in chair;with call bell/phone within reach;with chair alarm set Nurse Communication: Mobility status PT Visit Diagnosis: Pain;Difficulty in walking, not elsewhere classified (R26.2) Pain - Right/Left: Right Pain - part of body: Knee     Time: 0921-0947 PT Time Calculation (min) (ACUTE ONLY): 26 min  Charges:  $Gait Training: 8-22 mins $Therapeutic Activity: 8-22 mins                    Rica Koyanagi  PTA [place] [facility] M-F          [phone] Weekend pager  [phone]

## 2022-01-24 NOTE — Progress Notes (Signed)
SPORTS MEDICINE AND JOINT REPLACEMENT  Lara Mulch, MD    Carlyon Shadow, PA-C [address], [employer], [place]  [address]                             [phone]   PROGRESS NOTE  Subjective:  negative for Chest Pain  negative for Shortness of Breath  negative for Nausea/Vomiting   negative for Calf Pain  negative for Bowel Movement   Tolerating Diet: yes         Patient reports pain as 4 on 0-10 scale.    Objective: Vital signs in last 24 hours:   Patient Vitals for the past 24 hrs:  BP Temp Temp src Pulse Resp SpO2  01/24/22 0957 (!) 143/46 97.9 F (36.6 C) Oral 97 18 94 %  01/24/22 0523 (!) 148/60 98.4 F (36.9 C) Oral 98 17 91 %  01/23/22 2157 (!) 146/58 98.3 F (36.8 C) Oral 99 17 90 %  01/23/22 1536 (!) 126/57 98.2 F (36.8 C) Oral 93 18 97 %    '@flow'$ {1959:LAST@   Intake/Output from previous day:   08/08 0701 - 08/09 0700 In: 1432.4 [P.O.:420; I.V.:1012.4] Out: 250 [Urine:250]   Intake/Output this shift:   No intake/output data recorded.   Intake/Output      08/08 0701 08/09 0700 08/09 0701 08/10 0700   P.O. 420    I.V. (mL/kg) 1012.4 (12.5)    IV Piggyback 0    Total Intake(mL/kg) 1432.4 (17.6)    Urine (mL/kg/hr) 250 (0.1)    Blood     Total Output 250    Net +1182.4         Urine Occurrence 6 x 1 x   Stool Occurrence 0 x       LABORATORY DATA: No results for input(s): "WBC", "HGB", "HCT", "PLT" in the last 168 hours. No results for input(s): "NA", "K", "CL", "CO2", "BUN", "CREATININE", "GLUCOSE", "CALCIUM" in the last 168 hours. Lab Results  Component Value Date   INR 1.1 08/31/2021   INR 0.99 01/26/2017   INR 0.8 03/13/2007    Examination:  General appearance: alert, cooperative, and no distress Extremities: extremities normal, atraumatic, no cyanosis or edema  Wound Exam: clean, dry, intact   Drainage:  None: wound tissue dry  Motor Exam: Quadriceps and Hamstrings Intact  Sensory Exam: Superficial Peroneal, Deep  Peroneal, and Tibial normal   Assessment:    2 Days Post-Op  Procedure(s) (LRB): TOTAL KNEE REVISION (Right)  ADDITIONAL DIAGNOSIS:  Principal Problem:   S/P revision of total knee     Plan: Physical Therapy as ordered Weight Bearing as Tolerated (WBAT)  DVT Prophylaxis:  Aspirin  DISCHARGE PLAN: Home  Pain improved today but still hasnt passed PT, will stay 1 more night      Anticipated LOS equal to or greater than 2 midnights due to - Age 60 and older with one or more of the following:  - Obesity  - Expected need for hospital services (PT, OT, Nursing) required for safe  discharge  - Anticipated need for postoperative skilled nursing care or inpatient rehab   Donia Ast 01/24/2022, 12:56 PM

## 2022-01-24 NOTE — Plan of Care (Signed)
  Problem: Pain Managment: Goal: General experience of comfort will improve Outcome: Progressing   Problem: Safety: Goal: Ability to remain free from injury will improve Outcome: Progressing   

## 2022-01-25 ENCOUNTER — Inpatient Hospital Stay (HOSPITAL_COMMUNITY): Payer: Medicare PPO

## 2022-01-25 LAB — URINALYSIS, ROUTINE W REFLEX MICROSCOPIC
Bilirubin Urine: NEGATIVE
Glucose, UA: NEGATIVE mg/dL
Hgb urine dipstick: NEGATIVE
Ketones, ur: NEGATIVE mg/dL
Leukocytes,Ua: NEGATIVE
Nitrite: NEGATIVE
Protein, ur: NEGATIVE mg/dL
Specific Gravity, Urine: 1.009 (ref 1.005–1.030)
pH: 5 (ref 5.0–8.0)

## 2022-01-25 MED ORDER — ACETAMINOPHEN 500 MG PO TABS
1000.0000 mg | ORAL_TABLET | Freq: Four times a day (QID) | ORAL | Status: DC | PRN
  Administered 2022-01-25 – 2022-01-26 (×3): 1000 mg via ORAL
  Filled 2022-01-25 (×3): qty 2

## 2022-01-25 MED ORDER — TRAMADOL HCL 50 MG PO TABS: 50.0000 mg | ORAL_TABLET | Freq: Four times a day (QID) | ORAL | Status: DC | PRN

## 2022-01-25 NOTE — Progress Notes (Signed)
PHYSICAL THERAPY  Pt not seen this morning.  Was assisted OOB to [facility] with nursing.  Received a bath then when I went back to see her she needed pain meds.  Currently she is back in bed needing to rest.  Daughter reports pt is having increased confusion, difficulty with words and groggy. ? Pain meds OXY.   Pt and family agreed I return at [place]  PTA [place] [facility] M-F          [phone] Weekend pager [phone]

## 2022-01-25 NOTE — Progress Notes (Signed)
Physical Therapy Treatment Patient Details Name: [name] MRN: [id number] DOB: Oct 26, 1945 Today's Date: 01/25/2022   History of Present Illness Pt is a 76yo female presenting s/p R-TKR on 8.7.23 PMH: Dsyrhythmia, GERD, fibromyalgia, HTN, R-TKA.    PT Comments    POD # 3 pm session Pt in bed on 2 lts O2 at 97% so removed.  Assisted OOB to [facility] required increased time and much effort.  General transfer comment: first assisted from bed to [facility] then back to EOB with 50% VC's on hand placement and increased time. General Gait Details: tolerated amb a total of 18 feet with increased time to and from potable stairs parked just outside doorway. Pain was 7/10 and MAX c/o fatigue.General stair comments: 50% VC's on proper sequencing with Daughter "hands on" under Therapist Instructions on safe handling. Pt still groggy and slow with delay in word finding.   MD has ordered a chest X ray, UA and pain med change. Pt did NOT meet her mobility goals to safely D/C to home today.    Recommendations for follow up therapy are one component of a multi-disciplinary discharge planning process, led by the attending physician.  Recommendations may be updated based on patient status, additional functional criteria and insurance authorization.  Follow Up Recommendations  Follow physician's recommendations for discharge plan and follow up therapies     Assistance Recommended at Discharge Intermittent Supervision/Assistance  Patient can return home with the following A little help with walking and/or transfers;Two people to help with walking and/or transfers;Assistance with cooking/housework;Assist for transportation;Help with stairs or ramp for entrance   Equipment Recommendations  None recommended by PT    Recommendations for Other Services       Precautions / Restrictions Precautions Precautions: Fall Restrictions Weight Bearing Restrictions: No RLE Weight Bearing: Weight bearing as tolerated      Mobility  Bed Mobility Overal bed mobility: Needs Assistance Bed Mobility: Supine to Sit, Sit to Supine     Supine to sit: Min assist Sit to supine: Mod assist   General bed mobility comments: assisted back to bed and positioned to comfort.    Transfers Overall transfer level: Needs assistance Equipment used: Rolling walker (2 wheels) Transfers: Sit to/from Stand, Bed to chair/wheelchair/BSC Sit to Stand: Supervision, Min guard Stand pivot transfers: Min guard, Supervision         General transfer comment: first assisted from bed to [facility] then back to EOB with 50% VC's on hand placement and increased time.    Ambulation/Gait Ambulation/Gait assistance: Min guard, Min assist Gait Distance (Feet): 18 Feet Assistive device: Rolling walker (2 wheels) Gait Pattern/deviations: Step-to pattern, Decreased stance time - right Gait velocity: decreased     General Gait Details: tolerated amb a total of 18 feet with increased time to and from potable stairs parked just outside doorway. Pain was 7/10 and MAX c/o fatigue.   Stairs Stairs: Yes Stairs assistance: Min guard, Min assist Stair Management: Two rails, Step to pattern, Forwards Number of Stairs: 2 General stair comments: 50% VC's on proper sequencing with Daughter "hands on" under Therapist Instructions on safe handling.   Wheelchair Mobility    Modified Rankin (Stroke Patients Only)       Balance                                            Cognition Arousal/Alertness: Awake/alert Behavior  During Therapy: WFL for tasks assessed/performed                                   General Comments: AxO x 3 very sweet lady but still c/o nausea as well as increased pain.  With both of these has increased anxiety.        Exercises      General Comments        Pertinent Vitals/Pain Pain Assessment Pain Assessment: 0-10 Pain Score: 8  Pain Location: right knee Pain Descriptors  / Indicators: Operative site guarding, Guarding, Tender, Throbbing, Grimacing Pain Intervention(s): Monitored during session, Premedicated before session, Repositioned, Ice applied    Home Living                          Prior Function            PT Goals (current goals can now be found in the care plan section) Progress towards PT goals: Progressing toward goals    Frequency    7X/week      PT Plan Current plan remains appropriate    Co-evaluation              AM-PAC PT "6 Clicks" Mobility   Outcome Measure  Help needed turning from your back to your side while in a flat bed without using bedrails?: A Little Help needed moving from lying on your back to sitting on the side of a flat bed without using bedrails?: A Little Help needed moving to and from a bed to a chair (including a wheelchair)?: A Little Help needed standing up from a chair using your arms (e.g., wheelchair or bedside chair)?: A Little Help needed to walk in hospital room?: A Little Help needed climbing 3-5 steps with a railing? : A Lot 6 Click Score: 17    End of Session   Activity Tolerance: Patient limited by fatigue;Patient limited by pain;Other (comment) (nausea) Patient left: in bed;with bed alarm set;with call bell/phone within reach;with family/visitor present Nurse Communication: Mobility status PT Visit Diagnosis: Pain;Difficulty in walking, not elsewhere classified (R26.2) Pain - Right/Left: Right Pain - part of body: Knee     Time: 0017-4944 PT Time Calculation (min) (ACUTE ONLY): 27 min  Charges:  $Gait Training: 8-22 mins $Therapeutic Activity: 8-22 mins                     Rica Koyanagi  PTA [facility] M-F          [phone] Weekend pager [phone]

## 2022-01-25 NOTE — Plan of Care (Signed)

## 2022-01-25 NOTE — Plan of Care (Signed)
  Problem: Education: Goal: Knowledge of General Education information will improve Description: Including pain rating scale, medication(s)/side effects and non-pharmacologic comfort measures Outcome: Progressing   Problem: Activity: Goal: Risk for activity intolerance will decrease Outcome: Progressing   Problem: Pain Managment: Goal: General experience of comfort will improve Outcome: Progressing   Problem: Safety: Goal: Ability to remain free from injury will improve Outcome: Progressing   Problem: Education: Goal: Knowledge of the prescribed therapeutic regimen will improve Outcome: Progressing   Problem: Clinical Measurements: Goal: Postoperative complications will be avoided or minimized Outcome: Progressing   Problem: Pain Management: Goal: Pain level will decrease with appropriate interventions Outcome: Progressing

## 2022-01-26 MED ORDER — ASPIRIN 81 MG PO CHEW: 81.0000 mg | CHEWABLE_TABLET | Freq: Two times a day (BID) | ORAL | 0 refills | Status: DC

## 2022-01-26 MED ORDER — TRAMADOL HCL 50 MG PO TABS: 50.0000 mg | ORAL_TABLET | Freq: Four times a day (QID) | ORAL | 0 refills | Status: DC | PRN

## 2022-01-26 MED ORDER — ACETAMINOPHEN 500 MG PO TABS: 1000.0000 mg | ORAL_TABLET | Freq: Four times a day (QID) | ORAL | 0 refills | Status: DC | PRN

## 2022-01-26 MED ORDER — ONDANSETRON HCL 4 MG PO TABS: 4.0000 mg | ORAL_TABLET | Freq: Three times a day (TID) | ORAL | 0 refills | Status: AC | PRN

## 2022-01-26 NOTE — Progress Notes (Signed)
SPORTS MEDICINE AND JOINT REPLACEMENT  Lara Mulch, MD    Carlyon Shadow, PA-C [address], [place], [place]  [address]                             [phone]   PROGRESS NOTE  Subjective:  negative for Chest Pain  negative for Shortness of Breath  negative for Nausea/Vomiting   negative for Calf Pain  negative for Bowel Movement   Tolerating Diet: yes         Patient reports pain as 5 on 0-10 scale.    Objective: Vital signs in last 24 hours:   Patient Vitals for the past 24 hrs:  BP Temp Temp src Pulse Resp SpO2  01/26/22 0514 (!) 120/50 98.2 F (36.8 C) Oral 92 16 97 %  01/25/22 2110 (!) 141/58 98.4 F (36.9 C) Oral (!) 108 16 94 %  01/25/22 1400 (!) 126/47 98.2 F (36.8 C) Oral (!) 102 16 98 %  01/25/22 1200 -- -- -- -- -- 99 %  01/25/22 1110 -- -- -- -- -- (!) 88 %  01/25/22 1107 -- -- -- 96 -- 93 %  01/25/22 1100 -- -- -- -- -- 100 %  01/25/22 1035 (!) 128/57 99.1 F (37.3 C) Oral (!) 106 16 (!) 88 %  01/25/22 0706 -- -- -- (!) 103 16 94 %    '@flow'$ {1959:LAST@   Intake/Output from previous day:   08/10 0701 - 08/11 0700 In: 1868.7 [P.O.:800; I.V.:1068.7] Out: 600 [Urine:600]   Intake/Output this shift:   08/10 1901 - 08/11 0700 In: 814.5 [P.O.:360; I.V.:454.5] Out: 600 [Urine:600]   Intake/Output      08/10 0701 08/11 0700   P.O. 800   I.V. (mL/kg) 1068.7 (13.1)   Total Intake(mL/kg) 1868.7 (23)   Urine (mL/kg/hr) 600 (0.3)   Total Output 600   Net +1268.7       Urine Occurrence 1 x      LABORATORY DATA: No results for input(s): "WBC", "HGB", "HCT", "PLT" in the last 168 hours. No results for input(s): "NA", "K", "CL", "CO2", "BUN", "CREATININE", "GLUCOSE", "CALCIUM" in the last 168 hours. Lab Results  Component Value Date   INR 1.1 08/31/2021   INR 0.99 01/26/2017   INR 0.8 03/13/2007    Examination:  General appearance: alert, cooperative, and no distress Extremities: extremities normal, atraumatic, no cyanosis or  edema  Wound Exam: clean, dry, intact   Drainage:  None: wound tissue dry  Motor Exam: Quadriceps and Hamstrings Intact  Sensory Exam: Superficial Peroneal, Deep Peroneal, and Tibial normal   Assessment:    4 Days Post-Op  Procedure(s) (LRB): TOTAL KNEE REVISION (Right)  ADDITIONAL DIAGNOSIS:  Principal Problem:   S/P revision of total knee     Plan: Physical Therapy as ordered Weight Bearing as Tolerated (WBAT)  DVT Prophylaxis:  Aspirin  DISCHARGE PLAN: Home  Patient doing well, normal UA, normal CXR, ready for D/C home       Patient's anticipated LOS is less than 2 midnights, meeting these requirements: - Lives within 1 hour of care - Has a competent adult at home to recover with post-op recover - NO history of  - Chronic pain requiring opiods  - Diabetes  - Coronary Artery Disease  - Heart failure  - Heart attack  - Stroke  - DVT/VTE  - Cardiac arrhythmia  - Respiratory Failure/COPD  - Renal failure  - Anemia  -  Advanced Liver disease      Donia Ast 01/26/2022, 6:53 AM

## 2022-01-26 NOTE — Discharge Summary (Signed)
SPORTS MEDICINE & JOINT REPLACEMENT   Karen Mulch, MD   Karen Shadow, PA-C [place], [place], [place]  [address]                             [phone]  PATIENT ID: [name]        MRN:  [id number]          DOB/AGE: 20-Aug-1945 / 76 y.o.    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    ADDITIONAL DIAGNOSIS: Principal Problem:   S/P revision of total knee  Past Medical History:  Diagnosis Date   Anemia    Anxiety    B12 DEFICIENCY    Blood transfusion without reported diagnosis    Cataract    Cholecystitis, unspecified    Chronic sinusitis    DEPRESSION    Diastolic dysfunction    DIVERTICULOSIS, COLON    DYSLIPIDEMIA    Dysrhythmia    PVC's   Fibromyalgia    GERD    GERD (gastroesophageal reflux disease)    Hyperplastic colon polyp    HYPERTENSION    INSOMNIA, PERSISTENT    Morbid obesity (HCC)    OSTEOARTHRITIS    Osteoporosis    PONV (postoperative nausea and vomiting)    Skin cancer 2018   upper lip    SYNCOPE    Ulcer of esophagus    VITAMIN D DEFICIENCY     PROCEDURE: Procedure(s): TOTAL KNEE REVISION on 01/22/2022  CONSULTS:    HISTORY:  See H&P in chart  HOSPITAL COURSE:  [name] is a 76 y.o. 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  After appropriate laboratory studies were obtained  they were taken to the operating room on 01/22/2022 and underwent Procedure(s): TOTAL KNEE REVISION.   They were given perioperative antibiotics:  Anti-infectives (From admission, onward)    Start     Dose/Rate Route Frequency Ordered Stop   01/22/22 0930  ceFAZolin (ANCEF) IVPB 2g/100 mL premix        2 g 200 mL/hr over 30 Minutes Intravenous On call to O.R. 01/22/22 0919 01/22/22 1158      .  Patient given tranexamic acid IV or topical and exparel intra-operatively.  Tolerated the procedure well.    POD# 1: Vital signs were stable.  Patient denied Chest pain, shortness of breath, or calf pain.  Patient was started on Aspirin twice daily at 8am.  Consults to PT, OT, and care management were made.  The patient was weight bearing as tolerated.  CPM was placed on the operative leg 0-90 degrees for 6-8 hours a day. When out of the CPM, patient was placed in the foam block to achieve full extension. Incentive spirometry was taught.  Dressing was changed.       POD #2, Continued  PT for ambulation and exercise program.  IV saline locked.  O2 discontinued.    The remainder of the hospital course was dedicated to ambulation and strengthening.   The patient was discharged on 4 Days Post-Op in  Good condition.  Blood products given:none  DIAGNOSTIC STUDIES: Recent vital signs: Patient Vitals for the past 24 hrs:  BP Temp Temp src Pulse Resp SpO2  01/26/22 0514 (!) 120/50 98.2 F (36.8 C) Oral 92 16 97 %  01/25/22 2110 (!) 141/58 98.4 F (36.9 C) Oral (!) 108 16 94 %  01/25/22 1400 (!) 126/47 98.2 F (36.8 C) Oral (!) 102 16 98 %  01/25/22 1200 -- -- -- -- -- 99 %  01/25/22 1110 -- -- -- -- -- (!) 88 %  01/25/22 1107 -- -- -- 96 -- 93 %  01/25/22 1100 -- -- -- -- -- 100 %  01/25/22 1035 (!) 128/57 99.1 F (37.3 C) Oral (!) 106 16 (!) 88 %  01/25/22 0706 -- -- -- (!) 103 16 94 %       Recent laboratory studies: No results for input(s): "WBC", "HGB", "HCT", "PLT" in the last 168 hours. No results for input(s): "NA", "K", "CL", "CO2", "BUN", "CREATININE", "GLUCOSE", "CALCIUM" in the last 168 hours. Lab Results  Component Value Date   INR 1.1 08/31/2021   INR 0.99 01/26/2017   INR 0.8 03/13/2007     Recent Radiographic Studies :  DG Chest Port 1 View  Result Date: 01/25/2022 CLINICAL DATA:  Impaired mobility.  Hypertension EXAM: PORTABLE CHEST 1 VIEW COMPARISON:   08/31/2021 FINDINGS: The heart size and mediastinal contours are within normal limits. Both lungs are clear. The visualized skeletal structures are unremarkable. IMPRESSION: No active disease. Electronically Signed   By: Franchot Gallo [name]   On: 01/25/2022 17:18    DISCHARGE INSTRUCTIONS: Discharge Instructions     Call MD / Call 911   Complete by: As directed    If you experience chest pain or shortness of breath, CALL 911 and be transported to the hospital emergency room.  If you develope a fever above 101 F, pus (white drainage) or increased drainage or redness at the wound, or calf pain, call your surgeon's office.   Constipation Prevention   Complete by: As directed    Drink plenty of fluids.  Prune juice may be helpful.  You may use a stool softener, such as Colace (over the counter) 100 mg twice a day.  Use MiraLax (over the counter) for constipation as needed.   Diet - low sodium heart healthy   Complete by: As directed    Discharge instructions   Complete by: As directed    INSTRUCTIONS AFTER JOINT REPLACEMENT   Remove items at home which could result in a fall. This includes throw rugs or furniture in walking pathways ICE to the affected joint every three hours while awake for 30 minutes at a time, for at least the first 3-5 days, and then as needed for pain and swelling.  Continue to use ice for pain and swelling. You may notice swelling that will progress down to the foot and ankle.  This is normal after surgery.  Elevate your leg when you are not up walking on it.   Continue to use the breathing machine you got in the hospital (incentive spirometer) which will help keep your temperature down.  It is common for your temperature to cycle up and down following surgery, especially at night when you are not up moving around and exerting yourself.  The breathing machine keeps your lungs expanded and your temperature down.   DIET:  As you were doing prior to hospitalization, we recommend  a well-balanced diet.  DRESSING / WOUND CARE / SHOWERING  Keep the surgical dressing until follow up.  The dressing is water proof, so you can shower without any extra covering.  IF THE  DRESSING FALLS OFF or the wound gets wet inside, change the dressing with sterile gauze.  Please use good hand washing techniques before changing the dressing.  Do not use any lotions or creams on the incision until instructed by your surgeon.    ACTIVITY  Increase activity slowly as tolerated, but follow the weight bearing instructions below.   No driving for 6 weeks or until further direction given by your physician.  You cannot drive while taking narcotics.  No lifting or carrying greater than 10 lbs. until further directed by your surgeon. Avoid periods of inactivity such as sitting longer than an hour when not asleep. This helps prevent blood clots.  You may return to work once you are authorized by your doctor.     WEIGHT BEARING   Weight bearing as tolerated with assist device (walker, cane, etc) as directed, use it as long as suggested by your surgeon or therapist, typically at least 4-6 weeks.   EXERCISES  Results after joint replacement surgery are often greatly improved when you follow the exercise, range of motion and muscle strengthening exercises prescribed by your doctor. Safety measures are also important to protect the joint from further injury. Any time any of these exercises cause you to have increased pain or swelling, decrease what you are doing until you are comfortable again and then slowly increase them. If you have problems or questions, call your caregiver or physical therapist for advice.   Rehabilitation is important following a joint replacement. After just a few days of immobilization, the muscles of the leg can become weakened and shrink (atrophy).  These exercises are designed to build up the tone and strength of the thigh and leg muscles and to improve motion. Often times heat  used for twenty to thirty minutes before working out will loosen up your tissues and help with improving the range of motion but do not use heat for the first two weeks following surgery (sometimes heat can increase post-operative swelling).   These exercises can be done on a training (exercise) mat, on the floor, on a table or on a bed. Use whatever works the best and is most comfortable for you.    Use music or television while you are exercising so that the exercises are a pleasant break in your day. This will make your life better with the exercises acting as a break in your routine that you can look forward to.   Perform all exercises about fifteen times, three times per day or as directed.  You should exercise both the operative leg and the other leg as well.  Exercises include:   Quad Sets - Tighten up the muscle on the front of the thigh (Quad) and hold for 5-10 seconds.   Straight Leg Raises - With your knee straight (if you were given a brace, keep it on), lift the leg to 60 degrees, hold for 3 seconds, and slowly lower the leg.  Perform this exercise against resistance later as your leg gets stronger.  Leg Slides: Lying on your back, slowly slide your foot toward your buttocks, bending your knee up off the floor (only go as far as is comfortable). Then slowly slide your foot back down until your leg is flat on the floor again.  Angel Wings: Lying on your back spread your legs to the side as far apart as you can without causing discomfort.  Hamstring Strength:  Lying on your back, push your heel against the floor with your leg  straight by tightening up the muscles of your buttocks.  Repeat, but this time bend your knee to a comfortable angle, and push your heel against the floor.  You may put a pillow under the heel to make it more comfortable if necessary.   A rehabilitation program following joint replacement surgery can speed recovery and prevent re-injury in the future due to weakened  muscles. Contact your doctor or a physical therapist for more information on knee rehabilitation.    CONSTIPATION  Constipation is defined medically as fewer than three stools per week and severe constipation as less than one stool per week.  Even if you have a regular bowel pattern at home, your normal regimen is likely to be disrupted due to multiple reasons following surgery.  Combination of anesthesia, postoperative narcotics, change in appetite and fluid intake all can affect your bowels.   YOU MUST use at least one of the following options; they are listed in order of increasing strength to get the job done.  They are all available over the counter, and you may need to use some, POSSIBLY even all of these options:    Drink plenty of fluids (prune juice may be helpful) and high fiber foods Colace 100 mg by mouth twice a day  Senokot for constipation as directed and as needed Dulcolax (bisacodyl), take with full glass of water  Miralax (polyethylene glycol) once or twice a day as needed.  If you have tried all these things and are unable to have a bowel movement in the first 3-4 days after surgery call either your surgeon or your primary doctor.    If you experience loose stools or diarrhea, hold the medications until you stool forms back up.  If your symptoms do not get better within 1 week or if they get worse, check with your doctor.  If you experience "the worst abdominal pain ever" or develop nausea or vomiting, please contact the office immediately for further recommendations for treatment.   ITCHING:  If you experience itching with your medications, try taking only a single pain pill, or even half a pain pill at a time.  You can also use Benadryl over the counter for itching or also to help with sleep.   TED HOSE STOCKINGS:  Use stockings on both legs until for at least 2 weeks or as directed by physician office. They may be removed at night for sleeping.  MEDICATIONS:  See your  medication summary on the "After Visit Summary" that nursing will review with you.  You may have some home medications which will be placed on hold until you complete the course of blood thinner medication.  It is important for you to complete the blood thinner medication as prescribed.  PRECAUTIONS:  If you experience chest pain or shortness of breath - call 911 immediately for transfer to the hospital emergency department.   If you develop a fever greater that 101 F, purulent drainage from wound, increased redness or drainage from wound, foul odor from the wound/dressing, or calf pain - CONTACT YOUR SURGEON.                                                   FOLLOW-UP APPOINTMENTS:  If you do not already have a post-op appointment, please call the office for an appointment to be seen by your  Psychologist, sport and exercise.  Guidelines for how soon to be seen are listed in your "After Visit Summary", but are typically between 1-4 weeks after surgery.  OTHER INSTRUCTIONS:   Knee Replacement:  Do not place pillow under knee, focus on keeping the knee straight while resting. CPM instructions: 0-90 degrees, 2 hours in the morning, 2 hours in the afternoon, and 2 hours in the evening. Place foam block, curve side up under heel at all times except when in CPM or when walking.  DO NOT modify, tear, cut, or change the foam block in any way.  POST-OPERATIVE OPIOID TAPER INSTRUCTIONS: It is important to wean off of your opioid medication as soon as possible. If you do not need pain medication after your surgery it is ok to stop day one. Opioids include: Codeine, Hydrocodone(Norco, Vicodin), Oxycodone(Percocet, oxycontin) and hydromorphone amongst others.  Long term and even short term use of opiods can cause: Increased pain response Dependence Constipation Depression Respiratory depression And more.  Withdrawal symptoms can include Flu like symptoms Nausea, vomiting And more Techniques to manage these symptoms Hydrate  well Eat regular healthy meals Stay active Use relaxation techniques(deep breathing, meditating, yoga) Do Not substitute Alcohol to help with tapering If you have been on opioids for less than two weeks and do not have pain than it is ok to stop all together.  Plan to wean off of opioids This plan should start within one week post op of your joint replacement. Maintain the same interval or time between taking each dose and first decrease the dose.  Cut the total daily intake of opioids by one tablet each day Next start to increase the time between doses. The last dose that should be eliminated is the evening dose.     MAKE SURE YOU:  Understand these instructions.  Get help right away if you are not doing well or get worse.    Thank you for letting us be a part of your medical care team.  It is a privilege we respect greatly.  We hope these instructions will help you stay on track for a fast and full recovery!   Increase activity slowly as tolerated   Complete by: As directed    Post-operative opioid taper instructions:   Complete by: As directed    POST-OPERATIVE OPIOID TAPER INSTRUCTIONS: It is important to wean off of your opioid medication as soon as possible. If you do not need pain medication after your surgery it is ok to stop day one. Opioids include: Codeine, Hydrocodone(Norco, Vicodin), Oxycodone(Percocet, oxycontin) and hydromorphone amongst others.  Long term and even short term use of opiods can cause: Increased pain response Dependence Constipation Depression Respiratory depression And more.  Withdrawal symptoms can include Flu like symptoms Nausea, vomiting And more Techniques to manage these symptoms Hydrate well Eat regular healthy meals Stay active Use relaxation techniques(deep breathing, meditating, yoga) Do Not substitute Alcohol to help with tapering If you have been on opioids for less than two weeks and do not have pain than it is ok to stop all  together.  Plan to wean off of opioids This plan should start within one week post op of your joint replacement. Maintain the same interval or time between taking each dose and first decrease the dose.  Cut the total daily intake of opioids by one tablet each day Next start to increase the time between doses. The last dose that should be eliminated is the evening dose.  DISCHARGE MEDICATIONS:   Allergies as of 01/26/2022       Reactions   Levaquin [levofloxacin] Nausea Only   Other Nausea And Vomiting   General anesthesia    Omnicef [cefdinir] Diarrhea, Nausea And Vomiting        Medication List     STOP taking these medications    aspirin 81 MG tablet Replaced by: aspirin 81 MG chewable tablet   HYDROcodone-acetaminophen 5-325 MG tablet Commonly known as: NORCO/VICODIN       TAKE these medications    acetaminophen 500 MG tablet Commonly known as: TYLENOL Take 2 tablets (1,000 mg total) by mouth every 6 (six) hours as needed for mild pain or moderate pain.   ALPRAZolam 0.5 MG tablet Commonly known as: XANAX Take 1 tablet (0.5 mg total) by mouth 2 (two) times daily as needed. What changed:  how much to take reasons to take this   aspirin 81 MG chewable tablet Chew 1 tablet (81 mg total) by mouth 2 (two) times daily. Replaces: aspirin 81 MG tablet   bisacodyl 5 MG EC tablet Commonly known as: DULCOLAX Take 5 mg by mouth daily as needed for moderate constipation.   buPROPion 150 MG 24 hr tablet Commonly known as: WELLBUTRIN XL TAKE 1 TABLET BY MOUTH EVERY DAY   diclofenac Sodium 1 % Gel Commonly known as: VOLTAREN Apply 1 Application topically 4 (four) times daily as needed (pain).   fluticasone 50 MCG/ACT nasal spray Commonly known as: FLONASE Place 2 sprays into both nostrils daily. What changed:  when to take this reasons to take this   gabapentin 100 MG capsule Commonly known as: NEURONTIN Take 1 capsule (100 mg total) by mouth at  bedtime.   HYDROcodone bit-homatropine 5-1.5 MG/5ML syrup Commonly known as: HYCODAN Take 5 mLs by mouth every 6 (six) hours as needed for cough.   ibuprofen 200 MG tablet Commonly known as: ADVIL Take 200 mg by mouth every 6 (six) hours as needed for moderate pain.   lovastatin 40 MG tablet Commonly known as: MEVACOR Take 1 tablet (40 mg total) by mouth at bedtime. Annual appt due W/LABS must see provider for future refills   Melatonin 5 MG Caps Take 5 mg by mouth at bedtime as needed (sleep).   omeprazole 40 MG capsule Commonly known as: PRILOSEC Take 1 capsule (40 mg total) by mouth in the morning and at bedtime.   ondansetron 4 MG tablet Commonly known as: ZOFRAN Take 1 tablet (4 mg total) by mouth every 8 (eight) hours as needed for nausea. What changed: reasons to take this   PreserVision AREDS 2 Caps Take 1 capsule by mouth daily.   solifenacin 10 MG tablet Commonly known as: VESICARE TAKE 1 TABLET (10 MG TOTAL) BY MOUTH DAILY. ANNUAL APPT IS DUE MUST SEE PROVIDER FOR FUTURE REFILLS   traMADol 50 MG tablet Commonly known as: ULTRAM Take 1-2 tablets (50-100 mg total) by mouth every 6 (six) hours as needed for severe pain or moderate pain.   verapamil 240 MG CR tablet Commonly known as: CALAN-SR Take 1 tablet (240 mg total) by mouth at bedtime.   vitamin B-12 500 MCG tablet Commonly known as: CYANOCOBALAMIN Take 500 mcg by mouth daily.   Vitamin D 50 MCG (2000 UT) Caps Take 2,000 Units by mouth daily.               Durable Medical Equipment  (From admission, onward)           Start  Ordered   01/22/22 1524  DME Walker rolling  Once       Question:  Patient needs a walker to treat with the following condition  Answer:  S/P revision of total knee   01/22/22 1523   01/22/22 1524  DME 3 n 1  Once        01/22/22 1523   01/22/22 1524  DME Bedside commode  Once       Question:  Patient needs a bedside commode to treat with the following  condition  Answer:  S/P revision of total knee   01/22/22 1523            FOLLOW UP VISIT:    DISPOSITION: HOME VS. SNF  Dental Antibiotics:  In most cases prophylactic antibiotics for Dental procdeures after total joint surgery are not necessary.  Exceptions are as follows:  1. History of prior total joint infection  2. Severely immunocompromised (Organ Transplant, cancer chemotherapy, Rheumatoid biologic meds such as [place])  3. Poorly controlled diabetes (A1C &gt; 8.0, blood glucose over 200)  If you have one of these conditions, contact your surgeon for an antibiotic prescription, prior to your dental procedure.   CONDITION:  Good   Donia Ast 01/26/2022, 7:03 AM

## 2022-01-26 NOTE — Progress Notes (Signed)
Patient discharged to home w/ dtr. Given all belongings, instructions. Verbalized understanding of all instructions. Escorted to pov via w/c. 

## 2022-01-26 NOTE — Progress Notes (Signed)
Physical Therapy Treatment Patient Details Name: [name] MRN: [id number] DOB: 12/19/1945 Today's Date: 01/26/2022   History of Present Illness Pt is a 76yo female presenting s/p R-TKR on 8.7.23 PMH: Dsyrhythmia, GERD, fibromyalgia, HTN, R-TKA.    PT Comments    POD # 4 am session with Friend present "she is taking me home my daughter is at the house getting everything together. Removed ACE wrap.  Noted old dark drainage.  Reported to RN to change dressing before pt D/C'd.  Instructed pt/friend on use of TEDS.  Had Friend assist pt out of recliner to amb to stairs.  General transfer comment: self able to safely rise and lower with Friend "hands on" assisted using safety belt. General stair comments: pt was able to correctly recall proper sequencing.  Performed well with Friend "hands on" .  assisting with safety belt.General Gait Details: tolerated amb a total of 1820 feet with increased time to and from potable stairs parked just outside doorway. Pt feeling "much better" with NO issues of nausea and pain well controlled. Then returned to room to perform some TE's following HEP handout.  Instructed on proper tech, freq as well as use of ICE.   Addressed all mobility questions, discussed appropriate activity, educated on use of ICE.  Pt ready for D/C to home.   Recommendations for follow up therapy are one component of a multi-disciplinary discharge planning process, led by the attending physician.  Recommendations may be updated based on patient status, additional functional criteria and insurance authorization.  Follow Up Recommendations  Follow physician's recommendations for discharge plan and follow up therapies     Assistance Recommended at Discharge Intermittent Supervision/Assistance  Patient can return home with the following A little help with walking and/or transfers;Two people to help with walking and/or transfers;Assistance with cooking/housework;Assist for transportation;Help  with stairs or ramp for entrance   Equipment Recommendations  None recommended by PT    Recommendations for Other Services       Precautions / Restrictions Precautions Precautions: Fall Precaution Comments: instructed no pillow under knee Restrictions Weight Bearing Restrictions: No RLE Weight Bearing: Weight bearing as tolerated     Mobility  Bed Mobility               General bed mobility comments: OOB in recliner    Transfers Overall transfer level: Needs assistance Equipment used: Rolling walker (2 wheels) Transfers: Sit to/from Stand   Stand pivot transfers: Supervision         General transfer comment: self able to safely rise and lower with Friend "hands on" assisted using safety belt.    Ambulation/Gait Ambulation/Gait assistance: Supervision Gait Distance (Feet): 20 Feet Assistive device: Rolling walker (2 wheels) Gait Pattern/deviations: Step-to pattern, Decreased stance time - right Gait velocity: decreased     General Gait Details: tolerated amb a total of 1820 feet with increased time to and from potable stairs parked just outside doorway. Pt feeling "much better" with NO issues of nausea and pain well controlled.   Stairs     Stair Management: Two rails, Step to pattern, Forwards Number of Stairs: 2 General stair comments: pt was able to correctly recall proper sequencing.  Performed well with Friend "hands on" assisting with safety belt.   Wheelchair Mobility    Modified Rankin (Stroke Patients Only)       Balance  Cognition Arousal/Alertness: Awake/alert Behavior During Therapy: WFL for tasks assessed/performed Overall Cognitive Status: Within Functional Limits for tasks assessed                                 General Comments: AxO x 3 sweet Lady feeling sooooo much better now that she is not taking OXY/Robax.        Exercises  Total Knee  Replacement TE's following HEP handout 10 reps B LE ankle pumps 05 reps towel squeezes 05 reps knee presses 05 reps heel slides  05 reps SAQ's 05 reps SLR's 05 reps ABD Educated on use of gait belt to assist with TE's Followed by ICE     General Comments        Pertinent Vitals/Pain Pain Assessment Pain Assessment: 0-10 Pain Score: 5  Pain Location: right knee "tolerable" Pain Descriptors / Indicators: Operative site guarding, Guarding, Tender, Grimacing, Constant Pain Intervention(s): Monitored during session, Premedicated before session, Repositioned, Ice applied    Home Living                          Prior Function            PT Goals (current goals can now be found in the care plan section) Progress towards PT goals: Progressing toward goals    Frequency    7X/week      PT Plan Current plan remains appropriate    Co-evaluation              AM-PAC PT "6 Clicks" Mobility   Outcome Measure  Help needed turning from your back to your side while in a flat bed without using bedrails?: A Little Help needed moving from lying on your back to sitting on the side of a flat bed without using bedrails?: A Little Help needed moving to and from a bed to a chair (including a wheelchair)?: A Little Help needed standing up from a chair using your arms (e.g., wheelchair or bedside chair)?: A Little Help needed to walk in hospital room?: A Little Help needed climbing 3-5 steps with a railing? : A Little 6 Click Score: 18    End of Session Equipment Utilized During Treatment: Gait belt Activity Tolerance: Patient tolerated treatment well Patient left: in chair;with call bell/phone within reach;with family/visitor present Nurse Communication: Mobility status (pt ready for D/C to home after ONE PT session) PT Visit Diagnosis: Pain;Difficulty in walking, not elsewhere classified (R26.2) Pain - Right/Left: Right Pain - part of body: Knee     Time:  5379-4327 PT Time Calculation (min) (ACUTE ONLY): 28 min  Charges:  $Gait Training: 8-22 mins $Therapeutic Exercise: 8-22 mins                    Rica Koyanagi  PTA [place] [facility] M-F          [phone] Weekend pager [phone]

## 2022-01-29 ENCOUNTER — Telehealth: Payer: Self-pay | Admitting: *Deleted

## 2022-01-29 ENCOUNTER — Encounter: Payer: Self-pay | Admitting: *Deleted

## 2022-01-29 NOTE — Patient Outreach (Signed)
  Care Coordination [facility] Note Transition Care Management Follow-up Telephone Call Date of discharge and from where: 01/26/22 [facility] How have you been since you were released from the hospital? "Everything is coming along real good; I am not having any problems and am feeling pretty good" Any questions or concerns? No  Items Reviewed: Did the pt receive and understand the discharge instructions provided? Yes  Medications obtained and verified? Yes  Other? No  Any new allergies since your discharge? No  Dietary orders reviewed? Yes Do you have support at home? Yes  daughter and friend alternating staying with patient in her home  [place] and Equipment/Supplies: Were home health services ordered? no If so, what is the name of the agency? N/A  Has the agency set up a time to come to the patient's home? not applicable Were any new equipment or medical supplies ordered?  Yes: walker, 3-in1; BSC What is the name of the medical supply agency? "I can't remember" Were you able to get the supplies/equipment? yes Do you have any questions related to the use of the equipment or supplies? No  Functional Questionnaire: (I = Independent and D = Dependent) ADLs: I  Daughter and friend assisting as needed/ indicated  Bathing/Dressing- I  Daughter and friend assisting as needed/ indicated  Meal Prep- D  Daughter and friend handling meal prep  Eating- I  Maintaining continence- I  Transferring/Ambulation- I  Daughter and friend assisting as needed/ indicated  Managing Meds- I  Daughter and friend assisting as needed/ indicated  Follow up appointments reviewed:  PCP Hospital f/u appt confirmed? Yes  Scheduled to see PCP Dr. Alain Marion on 03/08/22 @ 2 pm [facility] f/u appt confirmed? Yes  Scheduled to see Dr. Ronnie Derby, surgeon on Thursday, 02/01/22 @ 9:30 am Are transportation arrangements needed? No  If their condition worsens, is the pt aware to call PCP or go to the Emergency Dept.?  Yes Was the patient provided with contact information for the PCP's office or ED? Yes Was to pt encouraged to call back with questions or concerns? Yes  SDOH assessments and interventions completed:   Yes  Care Coordination Interventions Activated:  Yes   Care Coordination Interventions:   Clarified change in ASA/ ACT therapy post- surgery     Encounter Outcome:  Pt. Visit Completed    Oneta Rack, RN, BSN, Roxana RN [place] [facility] [phone]: direct office

## 2022-02-01 DIAGNOSIS — R29898 Other symptoms and signs involving the musculoskeletal system: Secondary | ICD-10-CM | POA: Diagnosis not present

## 2022-02-01 DIAGNOSIS — M25661 Stiffness of right knee, not elsewhere classified: Secondary | ICD-10-CM | POA: Diagnosis not present

## 2022-02-01 DIAGNOSIS — Z7409 Other reduced mobility: Secondary | ICD-10-CM | POA: Diagnosis not present

## 2022-02-01 DIAGNOSIS — Z96651 Presence of right artificial knee joint: Secondary | ICD-10-CM | POA: Diagnosis not present

## 2022-02-06 DIAGNOSIS — R29898 Other symptoms and signs involving the musculoskeletal system: Secondary | ICD-10-CM | POA: Diagnosis not present

## 2022-02-06 DIAGNOSIS — Z7409 Other reduced mobility: Secondary | ICD-10-CM | POA: Diagnosis not present

## 2022-02-06 DIAGNOSIS — M25661 Stiffness of right knee, not elsewhere classified: Secondary | ICD-10-CM | POA: Diagnosis not present

## 2022-02-06 DIAGNOSIS — Z96651 Presence of right artificial knee joint: Secondary | ICD-10-CM | POA: Diagnosis not present

## 2022-02-08 DIAGNOSIS — R29898 Other symptoms and signs involving the musculoskeletal system: Secondary | ICD-10-CM | POA: Diagnosis not present

## 2022-02-08 DIAGNOSIS — M25661 Stiffness of right knee, not elsewhere classified: Secondary | ICD-10-CM | POA: Diagnosis not present

## 2022-02-08 DIAGNOSIS — M25561 Pain in right knee: Secondary | ICD-10-CM | POA: Diagnosis not present

## 2022-02-08 DIAGNOSIS — M25461 Effusion, right knee: Secondary | ICD-10-CM | POA: Diagnosis not present

## 2022-02-08 DIAGNOSIS — Z7409 Other reduced mobility: Secondary | ICD-10-CM | POA: Diagnosis not present

## 2022-02-08 DIAGNOSIS — Z96651 Presence of right artificial knee joint: Secondary | ICD-10-CM | POA: Diagnosis not present

## 2022-02-13 DIAGNOSIS — M25561 Pain in right knee: Secondary | ICD-10-CM | POA: Diagnosis not present

## 2022-02-13 DIAGNOSIS — Z7409 Other reduced mobility: Secondary | ICD-10-CM | POA: Diagnosis not present

## 2022-02-13 DIAGNOSIS — R29898 Other symptoms and signs involving the musculoskeletal system: Secondary | ICD-10-CM | POA: Diagnosis not present

## 2022-02-13 DIAGNOSIS — Z96651 Presence of right artificial knee joint: Secondary | ICD-10-CM | POA: Diagnosis not present

## 2022-02-13 DIAGNOSIS — M25461 Effusion, right knee: Secondary | ICD-10-CM | POA: Diagnosis not present

## 2022-02-13 DIAGNOSIS — M25661 Stiffness of right knee, not elsewhere classified: Secondary | ICD-10-CM | POA: Diagnosis not present

## 2022-02-16 DIAGNOSIS — Z96651 Presence of right artificial knee joint: Secondary | ICD-10-CM | POA: Diagnosis not present

## 2022-02-16 DIAGNOSIS — M25561 Pain in right knee: Secondary | ICD-10-CM | POA: Diagnosis not present

## 2022-02-16 DIAGNOSIS — M25661 Stiffness of right knee, not elsewhere classified: Secondary | ICD-10-CM | POA: Diagnosis not present

## 2022-02-16 DIAGNOSIS — Z7409 Other reduced mobility: Secondary | ICD-10-CM | POA: Diagnosis not present

## 2022-02-16 DIAGNOSIS — M25461 Effusion, right knee: Secondary | ICD-10-CM | POA: Diagnosis not present

## 2022-02-16 DIAGNOSIS — R29898 Other symptoms and signs involving the musculoskeletal system: Secondary | ICD-10-CM | POA: Diagnosis not present

## 2022-02-21 DIAGNOSIS — M25461 Effusion, right knee: Secondary | ICD-10-CM | POA: Diagnosis not present

## 2022-02-21 DIAGNOSIS — M25661 Stiffness of right knee, not elsewhere classified: Secondary | ICD-10-CM | POA: Diagnosis not present

## 2022-02-21 DIAGNOSIS — Z96651 Presence of right artificial knee joint: Secondary | ICD-10-CM | POA: Diagnosis not present

## 2022-02-21 DIAGNOSIS — Z7409 Other reduced mobility: Secondary | ICD-10-CM | POA: Diagnosis not present

## 2022-02-21 DIAGNOSIS — M25561 Pain in right knee: Secondary | ICD-10-CM | POA: Diagnosis not present

## 2022-02-21 DIAGNOSIS — R29898 Other symptoms and signs involving the musculoskeletal system: Secondary | ICD-10-CM | POA: Diagnosis not present

## 2022-02-23 DIAGNOSIS — Z96651 Presence of right artificial knee joint: Secondary | ICD-10-CM | POA: Diagnosis not present

## 2022-02-23 DIAGNOSIS — M25461 Effusion, right knee: Secondary | ICD-10-CM | POA: Diagnosis not present

## 2022-02-23 DIAGNOSIS — M25661 Stiffness of right knee, not elsewhere classified: Secondary | ICD-10-CM | POA: Diagnosis not present

## 2022-02-23 DIAGNOSIS — R29898 Other symptoms and signs involving the musculoskeletal system: Secondary | ICD-10-CM | POA: Diagnosis not present

## 2022-02-23 DIAGNOSIS — M25561 Pain in right knee: Secondary | ICD-10-CM | POA: Diagnosis not present

## 2022-02-23 DIAGNOSIS — Z7409 Other reduced mobility: Secondary | ICD-10-CM | POA: Diagnosis not present

## 2022-02-27 DIAGNOSIS — Z96651 Presence of right artificial knee joint: Secondary | ICD-10-CM | POA: Diagnosis not present

## 2022-02-27 DIAGNOSIS — Z7409 Other reduced mobility: Secondary | ICD-10-CM | POA: Diagnosis not present

## 2022-02-27 DIAGNOSIS — M25661 Stiffness of right knee, not elsewhere classified: Secondary | ICD-10-CM | POA: Diagnosis not present

## 2022-02-27 DIAGNOSIS — M25561 Pain in right knee: Secondary | ICD-10-CM | POA: Diagnosis not present

## 2022-02-27 DIAGNOSIS — M25461 Effusion, right knee: Secondary | ICD-10-CM | POA: Diagnosis not present

## 2022-02-27 DIAGNOSIS — R29898 Other symptoms and signs involving the musculoskeletal system: Secondary | ICD-10-CM | POA: Diagnosis not present

## 2022-03-01 DIAGNOSIS — Z96651 Presence of right artificial knee joint: Secondary | ICD-10-CM | POA: Diagnosis not present

## 2022-03-01 DIAGNOSIS — M25561 Pain in right knee: Secondary | ICD-10-CM | POA: Diagnosis not present

## 2022-03-01 DIAGNOSIS — Z7409 Other reduced mobility: Secondary | ICD-10-CM | POA: Diagnosis not present

## 2022-03-01 DIAGNOSIS — M25461 Effusion, right knee: Secondary | ICD-10-CM | POA: Diagnosis not present

## 2022-03-01 DIAGNOSIS — M25661 Stiffness of right knee, not elsewhere classified: Secondary | ICD-10-CM | POA: Diagnosis not present

## 2022-03-01 DIAGNOSIS — R29898 Other symptoms and signs involving the musculoskeletal system: Secondary | ICD-10-CM | POA: Diagnosis not present

## 2022-03-06 DIAGNOSIS — R29898 Other symptoms and signs involving the musculoskeletal system: Secondary | ICD-10-CM | POA: Diagnosis not present

## 2022-03-06 DIAGNOSIS — M25461 Effusion, right knee: Secondary | ICD-10-CM | POA: Diagnosis not present

## 2022-03-06 DIAGNOSIS — M25661 Stiffness of right knee, not elsewhere classified: Secondary | ICD-10-CM | POA: Diagnosis not present

## 2022-03-06 DIAGNOSIS — Z7409 Other reduced mobility: Secondary | ICD-10-CM | POA: Diagnosis not present

## 2022-03-06 DIAGNOSIS — M25561 Pain in right knee: Secondary | ICD-10-CM | POA: Diagnosis not present

## 2022-03-06 DIAGNOSIS — Z96651 Presence of right artificial knee joint: Secondary | ICD-10-CM | POA: Diagnosis not present

## 2022-03-07 ENCOUNTER — Other Ambulatory Visit: Payer: Self-pay | Admitting: Internal Medicine

## 2022-03-07 DIAGNOSIS — Z13820 Encounter for screening for osteoporosis: Secondary | ICD-10-CM

## 2022-03-08 ENCOUNTER — Encounter: Payer: Self-pay | Admitting: Internal Medicine

## 2022-03-08 ENCOUNTER — Ambulatory Visit: Payer: Medicare PPO | Admitting: Internal Medicine

## 2022-03-08 VITALS — BP 122/70 | HR 82 | Temp 98.9°F | Ht 61.0 in | Wt 165.6 lb

## 2022-03-08 DIAGNOSIS — E538 Deficiency of other specified B group vitamins: Secondary | ICD-10-CM

## 2022-03-08 DIAGNOSIS — R490 Dysphonia: Secondary | ICD-10-CM | POA: Diagnosis not present

## 2022-03-08 DIAGNOSIS — F4323 Adjustment disorder with mixed anxiety and depressed mood: Secondary | ICD-10-CM | POA: Diagnosis not present

## 2022-03-08 DIAGNOSIS — I10 Essential (primary) hypertension: Secondary | ICD-10-CM | POA: Diagnosis not present

## 2022-03-08 DIAGNOSIS — Z96651 Presence of right artificial knee joint: Secondary | ICD-10-CM | POA: Diagnosis not present

## 2022-03-08 DIAGNOSIS — Z23 Encounter for immunization: Secondary | ICD-10-CM | POA: Diagnosis not present

## 2022-03-08 MED ORDER — LINACLOTIDE 290 MCG PO CAPS: 290.0000 ug | ORAL_CAPSULE | Freq: Every day | ORAL | 11 refills | Status: DC

## 2022-03-08 MED ORDER — LORATADINE 10 MG PO TABS: 10.0000 mg | ORAL_TABLET | Freq: Every day | ORAL | 5 refills | Status: DC

## 2022-03-08 MED ORDER — ALPRAZOLAM 0.5 MG PO TABS: 0.5000 mg | ORAL_TABLET | Freq: Two times a day (BID) | ORAL | 3 refills | Status: DC | PRN

## 2022-03-08 NOTE — Assessment & Plan Note (Signed)
On B12 

## 2022-03-08 NOTE — Patient Instructions (Signed)
Wt Readings from Last 3 Encounters:  03/08/22 165 lb 9.6 oz (75.1 kg)  01/22/22 179 lb 3.7 oz (81.3 kg)  01/08/22 169 lb (76.7 kg)

## 2022-03-08 NOTE — Assessment & Plan Note (Signed)
S/p R TKR 6 wks ago Doing well In PT

## 2022-03-08 NOTE — Assessment & Plan Note (Signed)
Cont on Xanax prn. On Wellbutrin.  Potential benefits of a long term benzodiazepines  use as well as potential risks  and complications were explained to the patient and were aknowledged.

## 2022-03-08 NOTE — Assessment & Plan Note (Signed)
On Verapamil 

## 2022-03-08 NOTE — Progress Notes (Signed)
Subjective:  Patient ID: [name], female    DOB: [birth year]  Age: 76 y.o. MRN: [id number]  CC: Follow-up (3 month f/u)   HPI [name] presents for GERD, OA, anxiety S/p R TKR 6 wks ago C/o hoarseness, cough spells x months  Outpatient Medications Prior to Visit  Medication Sig Dispense Refill   acetaminophen (TYLENOL) 500 MG tablet Take 2 tablets (1,000 mg total) by mouth every 6 (six) hours as needed for mild pain or moderate pain. 30 tablet 0   aspirin 81 MG chewable tablet Chew 1 tablet (81 mg total) by mouth 2 (two) times daily. 30 tablet 0   bisacodyl (DULCOLAX) 5 MG EC tablet Take 5 mg by mouth daily as needed for moderate constipation.     buPROPion (WELLBUTRIN XL) 150 MG 24 hr tablet TAKE 1 TABLET BY MOUTH EVERY DAY 90 tablet 1   Cholecalciferol (VITAMIN D) 2000 UNITS CAPS Take 2,000 Units by mouth daily.     diclofenac Sodium (VOLTAREN) 1 % GEL Apply 1 Application topically 4 (four) times daily as needed (pain).     fluticasone (FLONASE) 50 MCG/ACT nasal spray Place 2 sprays into both nostrils daily. (Patient taking differently: Place 2 sprays into both nostrils daily as needed for allergies.) 48 g 3   gabapentin (NEURONTIN) 100 MG capsule Take 1 capsule (100 mg total) by mouth at bedtime. 90 capsule 3   ibuprofen (ADVIL) 200 MG tablet Take 200 mg by mouth every 6 (six) hours as needed for moderate pain.     lovastatin (MEVACOR) 40 MG tablet Take 1 tablet (40 mg total) by mouth at bedtime. Annual appt due W/LABS must see provider for future refills 90 tablet 3   Melatonin 5 MG CAPS Take 5 mg by mouth at bedtime as needed (sleep).     Multiple Vitamins-Minerals (PRESERVISION AREDS 2) CAPS Take 1 capsule by mouth daily.     omeprazole (PRILOSEC) 40 MG capsule Take 1 capsule (40 mg total) by mouth in the morning and at bedtime. 180 capsule 3   ondansetron (ZOFRAN) 4 MG tablet Take 1 tablet (4 mg total) by mouth every 8 (eight) hours as needed for nausea. 20 tablet 0    solifenacin (VESICARE) 10 MG tablet TAKE 1 TABLET (10 MG TOTAL) BY MOUTH DAILY. ANNUAL APPT IS DUE MUST SEE PROVIDER FOR FUTURE REFILLS 90 tablet 3   traMADol (ULTRAM) 50 MG tablet Take 1-2 tablets (50-100 mg total) by mouth every 6 (six) hours as needed for severe pain or moderate pain. 40 tablet 0   verapamil (CALAN-SR) 240 MG CR tablet Take 1 tablet (240 mg total) by mouth at bedtime. 90 tablet 3   vitamin B-12 (CYANOCOBALAMIN) 500 MCG tablet Take 500 mcg by mouth daily.     ALPRAZolam (XANAX) 0.5 MG tablet Take 1 tablet (0.5 mg total) by mouth 2 (two) times daily as needed. (Patient taking differently: Take 0.25-0.5 mg by mouth 2 (two) times daily as needed for anxiety or sleep.) 60 tablet 3   oxyCODONE (OXY IR/ROXICODONE) 5 MG immediate release tablet Take by mouth. Take by mouth once a day     HYDROcodone bit-homatropine (HYCODAN) 5-1.5 MG/5ML syrup Take 5 mLs by mouth every 6 (six) hours as needed for cough. (Patient not taking: Reported on 03/08/2022)     No facility-administered medications prior to visit.    ROS: Review of Systems  Constitutional:  Negative for activity change, appetite change, chills, fatigue and unexpected weight change.  HENT:  Positive for sore throat and voice change. Negative for congestion, mouth sores and sinus pressure.   Eyes:  Negative for visual disturbance.  Respiratory:  Negative for cough and chest tightness.   Gastrointestinal:  Negative for abdominal pain and nausea.  Genitourinary:  Negative for difficulty urinating, frequency and vaginal pain.  Musculoskeletal:  Positive for arthralgias. Negative for back pain and gait problem.  Skin:  Negative for pallor and rash.  Neurological:  Negative for dizziness, tremors, weakness, numbness and headaches.  Hematological:  Does not bruise/bleed easily.  Psychiatric/Behavioral:  Negative for confusion, sleep disturbance and suicidal ideas. The patient is nervous/anxious.     Objective:  BP 122/70 (BP  Location: Left Arm)   Pulse 82   Temp 98.9 F (37.2 C) (Oral)   Ht '5\' 1"'$  (1.549 m)   Wt 165 lb 9.6 oz (75.1 kg)   SpO2 95%   BMI 31.29 kg/m   BP Readings from Last 3 Encounters:  03/08/22 122/70  01/26/22 (!) 120/50  01/08/22 (!) 148/68    Wt Readings from Last 3 Encounters:  03/08/22 165 lb 9.6 oz (75.1 kg)  01/22/22 179 lb 3.7 oz (81.3 kg)  01/08/22 169 lb (76.7 kg)    Physical Exam Constitutional:      General: She is not in acute distress.    Appearance: She is well-developed. She is obese.  HENT:     Head: Normocephalic.     Right Ear: External ear normal.     Left Ear: External ear normal.     Nose: Nose normal.  Eyes:     General:        Right eye: No discharge.        Left eye: No discharge.     Conjunctiva/sclera: Conjunctivae normal.     Pupils: Pupils are equal, round, and reactive to light.  Neck:     Thyroid: No thyromegaly.     Vascular: No JVD.     Trachea: No tracheal deviation.  Cardiovascular:     Rate and Rhythm: Normal rate and regular rhythm.     Heart sounds: Normal heart sounds.  Pulmonary:     Effort: No respiratory distress.     Breath sounds: No stridor. No wheezing.  Abdominal:     General: Bowel sounds are normal. There is no distension.     Palpations: Abdomen is soft. There is no mass.     Tenderness: There is no abdominal tenderness. There is no guarding or rebound.  Musculoskeletal:        General: Tenderness present.     Cervical back: Normal range of motion and neck supple. No rigidity.  Lymphadenopathy:     Cervical: No cervical adenopathy.  Skin:    Findings: No erythema or rash.  Neurological:     Cranial Nerves: No cranial nerve deficit.     Motor: No abnormal muscle tone.     Coordination: Coordination normal.     Deep Tendon Reflexes: Reflexes normal.  Psychiatric:        Behavior: Behavior normal.        Thought Content: Thought content normal.        Judgment: Judgment normal.   R knee w/a scar  Lab  Results  Component Value Date   WBC 7.1 01/08/2022   HGB 11.8 (L) 01/08/2022   HCT 37.1 01/08/2022   PLT 257 01/08/2022   GLUCOSE 90 01/08/2022   CHOL 141 08/31/2021   TRIG 54 08/31/2021   HDL 48 08/31/2021  LDLDIRECT 111.0 02/04/2019   LDLCALC 82 08/31/2021   ALT 10 01/08/2022   AST 10 (L) 01/08/2022   NA 138 01/08/2022   K 3.9 01/08/2022   CL 108 01/08/2022   CREATININE 0.76 01/08/2022   BUN 11 01/08/2022   CO2 25 01/08/2022   TSH 0.504 08/31/2021   INR 1.1 08/31/2021   HGBA1C 5.6 08/31/2021    No results found.  Assessment & Plan:   Problem List Items Addressed This Visit     Adjustment disorder with mixed anxiety and depressed mood    Cont on Xanax prn. On Wellbutrin.  Potential benefits of a long term benzodiazepines  use as well as potential risks  and complications were explained to the patient and were aknowledged.      B12 deficiency    On B12      Essential hypertension - Primary    On Verapamil      S/P revision of total knee    S/p R TKR 6 wks ago Doing well In PT      Other Visit Diagnoses     Needs flu shot       Relevant Orders   Flu Vaccine QUAD High Dose(Fluad) (Completed)   Hoarseness       Relevant Orders   Ambulatory referral to ENT         Meds ordered this encounter  Medications   loratadine (CLARITIN) 10 MG tablet    Sig: Take 1 tablet (10 mg total) by mouth daily.    Dispense:  30 tablet    Refill:  5   ALPRAZolam (XANAX) 0.5 MG tablet    Sig: Take 1 tablet (0.5 mg total) by mouth 2 (two) times daily as needed.    Dispense:  60 tablet    Refill:  3   linaclotide (LINZESS) 290 MCG CAPS capsule    Sig: Take 1 capsule (290 mcg total) by mouth daily.    Dispense:  30 capsule    Refill:  11      Follow-up: Return in about 3 months (around 06/07/2022) for a follow-up visit.  Walker Kehr, MD

## 2022-03-13 DIAGNOSIS — Z96651 Presence of right artificial knee joint: Secondary | ICD-10-CM | POA: Diagnosis not present

## 2022-03-13 DIAGNOSIS — Z7409 Other reduced mobility: Secondary | ICD-10-CM | POA: Diagnosis not present

## 2022-03-13 DIAGNOSIS — M25461 Effusion, right knee: Secondary | ICD-10-CM | POA: Diagnosis not present

## 2022-03-13 DIAGNOSIS — M25561 Pain in right knee: Secondary | ICD-10-CM | POA: Diagnosis not present

## 2022-03-13 DIAGNOSIS — M25661 Stiffness of right knee, not elsewhere classified: Secondary | ICD-10-CM | POA: Diagnosis not present

## 2022-03-13 DIAGNOSIS — R29898 Other symptoms and signs involving the musculoskeletal system: Secondary | ICD-10-CM | POA: Diagnosis not present

## 2022-04-02 DIAGNOSIS — Z7409 Other reduced mobility: Secondary | ICD-10-CM | POA: Diagnosis not present

## 2022-04-02 DIAGNOSIS — M25561 Pain in right knee: Secondary | ICD-10-CM | POA: Diagnosis not present

## 2022-04-02 DIAGNOSIS — Z96651 Presence of right artificial knee joint: Secondary | ICD-10-CM | POA: Diagnosis not present

## 2022-04-02 DIAGNOSIS — M25461 Effusion, right knee: Secondary | ICD-10-CM | POA: Diagnosis not present

## 2022-04-02 DIAGNOSIS — M25661 Stiffness of right knee, not elsewhere classified: Secondary | ICD-10-CM | POA: Diagnosis not present

## 2022-04-02 DIAGNOSIS — R29898 Other symptoms and signs involving the musculoskeletal system: Secondary | ICD-10-CM | POA: Diagnosis not present

## 2022-05-02 DIAGNOSIS — H35363 Drusen (degenerative) of macula, bilateral: Secondary | ICD-10-CM | POA: Diagnosis not present

## 2022-05-02 DIAGNOSIS — H04123 Dry eye syndrome of bilateral lacrimal glands: Secondary | ICD-10-CM | POA: Diagnosis not present

## 2022-05-02 DIAGNOSIS — H43813 Vitreous degeneration, bilateral: Secondary | ICD-10-CM | POA: Diagnosis not present

## 2022-05-02 DIAGNOSIS — Z961 Presence of intraocular lens: Secondary | ICD-10-CM | POA: Diagnosis not present

## 2022-05-23 DIAGNOSIS — Z96651 Presence of right artificial knee joint: Secondary | ICD-10-CM | POA: Diagnosis not present

## 2022-06-05 ENCOUNTER — Ambulatory Visit: Payer: Medicare PPO | Admitting: Internal Medicine

## 2022-06-06 ENCOUNTER — Ambulatory Visit: Payer: Medicare PPO | Admitting: Internal Medicine

## 2022-06-09 ENCOUNTER — Telehealth: Payer: Medicare PPO | Admitting: Physician Assistant

## 2022-06-09 DIAGNOSIS — R3989 Other symptoms and signs involving the genitourinary system: Secondary | ICD-10-CM

## 2022-06-09 MED ORDER — SULFAMETHOXAZOLE-TRIMETHOPRIM 800-160 MG PO TABS: 1.0000 | ORAL_TABLET | Freq: Two times a day (BID) | ORAL | 0 refills | Status: DC

## 2022-06-09 NOTE — Progress Notes (Signed)
E-Visit for Urinary Problems  We are sorry that you are not feeling well.  Here is how we plan to help!  Based on what you shared with me it looks like you most likely have a simple urinary tract infection.  A UTI (Urinary Tract Infection) is a bacterial infection of the bladder.  Most cases of urinary tract infections are simple to treat but a key part of your care is to encourage you to drink plenty of fluids and watch your symptoms carefully.  I have prescribed Bactrim DS One tablet twice a day for 5 days.  Your symptoms should gradually improve. Call us if the burning in your urine worsens, you develop worsening fever, back pain or pelvic pain or if your symptoms do not resolve after completing the antibiotic.  Urinary tract infections can be prevented by drinking plenty of water to keep your body hydrated.  Also be sure when you wipe, wipe from front to back and don't hold it in!  If possible, empty your bladder every 4 hours.  HOME CARE Drink plenty of fluids Compete the full course of the antibiotics even if the symptoms resolve Remember, when you need to go.go. Holding in your urine can increase the likelihood of getting a UTI! GET HELP RIGHT AWAY IF: You cannot urinate You get a high fever Worsening back pain occurs You see blood in your urine You feel sick to your stomach or throw up You feel like you are going to pass out  MAKE SURE YOU  Understand these instructions. Will watch your condition. Will get help right away if you are not doing well or get worse.   Thank you for choosing an e-visit.  Your e-visit answers were reviewed by a board certified advanced clinical practitioner to complete your personal care plan. Depending upon the condition, your plan could have included both over the counter or prescription medications.  Please review your pharmacy choice. Make sure the pharmacy is open so you can pick up prescription now. If there is a problem, you may contact  your provider through MyChart messaging and have the prescription routed to another pharmacy.  Your safety is important to us. If you have drug allergies check your prescription carefully.   For the next 24 hours you can use MyChart to ask questions about today's visit, request a non-urgent call back, or ask for a work or school excuse. You will get an email in the next two days asking about your experience. I hope that your e-visit has been valuable and will speed your recovery.  I have spent 5 minutes in review of e-visit questionnaire, review and updating patient chart, medical decision making and response to patient.   Ameli Sangiovanni M Frenchie Dangerfield, PA-C  

## 2022-06-11 ENCOUNTER — Other Ambulatory Visit: Payer: Self-pay | Admitting: Internal Medicine

## 2022-06-19 DIAGNOSIS — K219 Gastro-esophageal reflux disease without esophagitis: Secondary | ICD-10-CM | POA: Diagnosis not present

## 2022-06-22 ENCOUNTER — Encounter: Payer: Self-pay | Admitting: Internal Medicine

## 2022-06-22 ENCOUNTER — Ambulatory Visit: Payer: Medicare PPO | Admitting: Internal Medicine

## 2022-06-22 VITALS — BP 132/70 | HR 80 | Temp 98.3°F | Ht 61.0 in | Wt 163.8 lb

## 2022-06-22 DIAGNOSIS — I10 Essential (primary) hypertension: Secondary | ICD-10-CM

## 2022-06-22 DIAGNOSIS — E538 Deficiency of other specified B group vitamins: Secondary | ICD-10-CM

## 2022-06-22 DIAGNOSIS — F4321 Adjustment disorder with depressed mood: Secondary | ICD-10-CM

## 2022-06-22 DIAGNOSIS — E785 Hyperlipidemia, unspecified: Secondary | ICD-10-CM | POA: Diagnosis not present

## 2022-06-22 DIAGNOSIS — K219 Gastro-esophageal reflux disease without esophagitis: Secondary | ICD-10-CM

## 2022-06-22 MED ORDER — LOVASTATIN 40 MG PO TABS: 40.0000 mg | ORAL_TABLET | Freq: Every day | ORAL | 3 refills | Status: DC

## 2022-06-22 MED ORDER — OMEPRAZOLE 40 MG PO CPDR: 40.0000 mg | DELAYED_RELEASE_CAPSULE | Freq: Two times a day (BID) | ORAL | 3 refills | Status: DC

## 2022-06-22 MED ORDER — VERAPAMIL HCL ER 240 MG PO TBCR: 240.0000 mg | EXTENDED_RELEASE_TABLET | Freq: Every day | ORAL | 3 refills | Status: DC

## 2022-06-22 MED ORDER — BUPROPION HCL ER (XL) 150 MG PO TB24: 150.0000 mg | ORAL_TABLET | Freq: Every day | ORAL | 1 refills | Status: DC

## 2022-06-22 MED ORDER — GABAPENTIN 100 MG PO CAPS: 100.0000 mg | ORAL_CAPSULE | Freq: Every day | ORAL | 3 refills | Status: AC

## 2022-06-22 MED ORDER — ALPRAZOLAM 0.5 MG PO TABS: 0.5000 mg | ORAL_TABLET | Freq: Two times a day (BID) | ORAL | 3 refills | Status: DC | PRN

## 2022-06-22 NOTE — Progress Notes (Signed)
Subjective:  Patient ID: [name], female    DOB: 09-24-1945  Age: 77 y.o. MRN: [id number]  CC: Follow-up (3 month f/u) and Medication Refill (Pt need refill on Bupropion, Gabapentin,Lovastatin, Omeprazole and Verapamil)   HPI [name] presents for HTN, dyslipidemia, GERD,anxiety  Outpatient Medications Prior to Visit  Medication Sig Dispense Refill   acetaminophen (TYLENOL) 500 MG tablet Take 2 tablets (1,000 mg total) by mouth every 6 (six) hours as needed for mild pain or moderate pain. 30 tablet 0   aspirin 81 MG chewable tablet Chew 1 tablet (81 mg total) by mouth 2 (two) times daily. 30 tablet 0   bisacodyl (DULCOLAX) 5 MG EC tablet Take 5 mg by mouth daily as needed for moderate constipation.     Cholecalciferol (VITAMIN D) 2000 UNITS CAPS Take 2,000 Units by mouth daily.     diclofenac Sodium (VOLTAREN) 1 % GEL Apply 1 Application topically 4 (four) times daily as needed (pain).     fluticasone (FLONASE) 50 MCG/ACT nasal spray Place 2 sprays into both nostrils daily. (Patient taking differently: Place 2 sprays into both nostrils daily as needed for allergies.) 48 g 3   ibuprofen (ADVIL) 200 MG tablet Take 200 mg by mouth every 6 (six) hours as needed for moderate pain.     linaclotide (LINZESS) 290 MCG CAPS capsule Take 1 capsule (290 mcg total) by mouth daily. 30 capsule 11   loratadine (CLARITIN) 10 MG tablet Take 1 tablet (10 mg total) by mouth daily. 30 tablet 5   Melatonin 5 MG CAPS Take 5 mg by mouth at bedtime as needed (sleep).     Multiple Vitamins-Minerals (PRESERVISION AREDS 2) CAPS Take 1 capsule by mouth daily.     ondansetron (ZOFRAN) 4 MG tablet Take 1 tablet (4 mg total) by mouth every 8 (eight) hours as needed for nausea. 20 tablet 0   oxyCODONE (OXY IR/ROXICODONE) 5 MG immediate release tablet Take by mouth. Take by mouth once a day     solifenacin (VESICARE) 10 MG tablet TAKE 1 TABLET (10 MG TOTAL) BY MOUTH DAILY. ANNUAL APPT IS DUE MUST SEE PROVIDER FOR  FUTURE REFILLS 90 tablet 3   traMADol (ULTRAM) 50 MG tablet Take 1-2 tablets (50-100 mg total) by mouth every 6 (six) hours as needed for severe pain or moderate pain. 40 tablet 0   vitamin B-12 (CYANOCOBALAMIN) 500 MCG tablet Take 500 mcg by mouth daily.     ALPRAZolam (XANAX) 0.5 MG tablet Take 1 tablet (0.5 mg total) by mouth 2 (two) times daily as needed. 60 tablet 3   buPROPion (WELLBUTRIN XL) 150 MG 24 hr tablet TAKE 1 TABLET BY MOUTH EVERY DAY 90 tablet 1   gabapentin (NEURONTIN) 100 MG capsule Take 1 capsule (100 mg total) by mouth at bedtime. 90 capsule 3   lovastatin (MEVACOR) 40 MG tablet Take 1 tablet (40 mg total) by mouth at bedtime. Annual appt due W/LABS must see provider for future refills 90 tablet 3   omeprazole (PRILOSEC) 40 MG capsule Take 1 capsule (40 mg total) by mouth in the morning and at bedtime. 180 capsule 3   sulfamethoxazole-trimethoprim (BACTRIM DS) 800-160 MG tablet Take 1 tablet by mouth 2 (two) times daily. 10 tablet 0   verapamil (CALAN-SR) 240 MG CR tablet Take 1 tablet (240 mg total) by mouth at bedtime. 90 tablet 3   No facility-administered medications prior to visit.    ROS: Review of Systems  Constitutional:  Negative for  activity change, appetite change, chills, fatigue and unexpected weight change.  HENT:  Negative for congestion, mouth sores and sinus pressure.   Eyes:  Negative for visual disturbance.  Respiratory:  Negative for cough and chest tightness.   Gastrointestinal:  Negative for abdominal pain and nausea.  Genitourinary:  Negative for difficulty urinating, frequency and vaginal pain.  Musculoskeletal:  Positive for gait problem. Negative for back pain.  Skin:  Negative for pallor and rash.  Neurological:  Negative for dizziness, tremors, weakness, numbness and headaches.  Psychiatric/Behavioral:  Negative for confusion and sleep disturbance.     Objective:  BP 132/70 (BP Location: Left Arm)   Pulse 80   Temp 98.3 F (36.8 C)  (Oral)   Ht '5\' 1"'$  (1.549 m)   Wt 163 lb 12.8 oz (74.3 kg)   SpO2 97%   BMI 30.95 kg/m   BP Readings from Last 3 Encounters:  06/22/22 132/70  03/08/22 122/70  01/26/22 (!) 120/50    Wt Readings from Last 3 Encounters:  06/22/22 163 lb 12.8 oz (74.3 kg)  03/08/22 165 lb 9.6 oz (75.1 kg)  01/22/22 179 lb 3.7 oz (81.3 kg)    Physical Exam Constitutional:      General: She is not in acute distress.    Appearance: She is well-developed. She is obese.  HENT:     Head: Normocephalic.     Right Ear: External ear normal.     Left Ear: External ear normal.     Nose: Nose normal.  Eyes:     General:        Right eye: No discharge.        Left eye: No discharge.     Conjunctiva/sclera: Conjunctivae normal.     Pupils: Pupils are equal, round, and reactive to light.  Neck:     Thyroid: No thyromegaly.     Vascular: No JVD.     Trachea: No tracheal deviation.  Cardiovascular:     Rate and Rhythm: Normal rate and regular rhythm.     Heart sounds: Normal heart sounds.  Pulmonary:     Effort: No respiratory distress.     Breath sounds: No stridor. No wheezing.  Abdominal:     General: Bowel sounds are normal. There is no distension.     Palpations: Abdomen is soft. There is no mass.     Tenderness: There is no abdominal tenderness. There is no guarding or rebound.  Musculoskeletal:        General: No tenderness.     Cervical back: Normal range of motion and neck supple. No rigidity.  Lymphadenopathy:     Cervical: No cervical adenopathy.  Skin:    Findings: No erythema or rash.  Neurological:     Mental Status: She is oriented to person, place, and time.     Cranial Nerves: No cranial nerve deficit.     Motor: No abnormal muscle tone.     Coordination: Coordination normal.     Deep Tendon Reflexes: Reflexes normal.  Psychiatric:        Behavior: Behavior normal.        Thought Content: Thought content normal.        Judgment: Judgment normal.     Lab Results   Component Value Date   WBC 7.1 01/08/2022   HGB 11.8 (L) 01/08/2022   HCT 37.1 01/08/2022   PLT 257 01/08/2022   GLUCOSE 90 01/08/2022   CHOL 141 08/31/2021   TRIG 54 08/31/2021  HDL 48 08/31/2021   LDLDIRECT 111.0 02/04/2019   LDLCALC 82 08/31/2021   ALT 10 01/08/2022   AST 10 (L) 01/08/2022   NA 138 01/08/2022   K 3.9 01/08/2022   CL 108 01/08/2022   CREATININE 0.76 01/08/2022   BUN 11 01/08/2022   CO2 25 01/08/2022   TSH 0.504 08/31/2021   INR 1.1 08/31/2021   HGBA1C 5.6 08/31/2021    No results found.  Assessment & Plan:   Problem List Items Addressed This Visit       Cardiovascular and Mediastinum   Essential hypertension    Continue on verapamil      Relevant Medications   lovastatin (MEVACOR) 40 MG tablet   verapamil (CALAN-SR) 240 MG CR tablet     Digestive   GERD    Continue on Prilosec      Relevant Medications   omeprazole (PRILOSEC) 40 MG capsule     Other   Grief - Primary    Widowed since June 2022      Dyslipidemia    Continue lovastatin      Relevant Medications   lovastatin (MEVACOR) 40 MG tablet   B12 deficiency    On B12         Meds ordered this encounter  Medications   lovastatin (MEVACOR) 40 MG tablet    Sig: Take 1 tablet (40 mg total) by mouth at bedtime.    Dispense:  90 tablet    Refill:  3   omeprazole (PRILOSEC) 40 MG capsule    Sig: Take 1 capsule (40 mg total) by mouth in the morning and at bedtime.    Dispense:  180 capsule    Refill:  3   verapamil (CALAN-SR) 240 MG CR tablet    Sig: Take 1 tablet (240 mg total) by mouth at bedtime.    Dispense:  90 tablet    Refill:  3   buPROPion (WELLBUTRIN XL) 150 MG 24 hr tablet    Sig: Take 1 tablet (150 mg total) by mouth daily.    Dispense:  90 tablet    Refill:  1   gabapentin (NEURONTIN) 100 MG capsule    Sig: Take 1 capsule (100 mg total) by mouth at bedtime.    Dispense:  90 capsule    Refill:  3   ALPRAZolam (XANAX) 0.5 MG tablet    Sig: Take 1  tablet (0.5 mg total) by mouth 2 (two) times daily as needed.    Dispense:  60 tablet    Refill:  3      Follow-up: Return in about 4 months (around 10/21/2022) for a follow-up visit.  Walker Kehr, MD

## 2022-06-22 NOTE — Assessment & Plan Note (Signed)
Widowed since June 2022

## 2022-06-22 NOTE — Assessment & Plan Note (Signed)
On B12 

## 2022-07-03 NOTE — Assessment & Plan Note (Signed)
Continue lovastatin 

## 2022-07-03 NOTE — Assessment & Plan Note (Signed)
Continue on verapamil

## 2022-07-03 NOTE — Assessment & Plan Note (Signed)
Continue on Prilosec. ?

## 2022-07-24 DIAGNOSIS — L578 Other skin changes due to chronic exposure to nonionizing radiation: Secondary | ICD-10-CM | POA: Diagnosis not present

## 2022-07-24 DIAGNOSIS — L720 Epidermal cyst: Secondary | ICD-10-CM | POA: Diagnosis not present

## 2022-07-24 DIAGNOSIS — D2272 Melanocytic nevi of left lower limb, including hip: Secondary | ICD-10-CM | POA: Diagnosis not present

## 2022-07-24 DIAGNOSIS — D2261 Melanocytic nevi of right upper limb, including shoulder: Secondary | ICD-10-CM | POA: Diagnosis not present

## 2022-07-24 DIAGNOSIS — L814 Other melanin hyperpigmentation: Secondary | ICD-10-CM | POA: Diagnosis not present

## 2022-07-24 DIAGNOSIS — L817 Pigmented purpuric dermatosis: Secondary | ICD-10-CM | POA: Diagnosis not present

## 2022-07-24 DIAGNOSIS — L813 Cafe au lait spots: Secondary | ICD-10-CM | POA: Diagnosis not present

## 2022-07-24 DIAGNOSIS — L821 Other seborrheic keratosis: Secondary | ICD-10-CM | POA: Diagnosis not present

## 2022-07-24 DIAGNOSIS — Z85828 Personal history of other malignant neoplasm of skin: Secondary | ICD-10-CM | POA: Diagnosis not present

## 2022-08-16 ENCOUNTER — Ambulatory Visit
Admission: RE | Admit: 2022-08-16 | Discharge: 2022-08-16 | Disposition: A | Discharge status: Home / Self Care | Payer: Medicare PPO | Source: Ambulatory Visit | Attending: Internal Medicine | Admitting: Internal Medicine

## 2022-08-16 DIAGNOSIS — M810 Age-related osteoporosis without current pathological fracture: Secondary | ICD-10-CM | POA: Diagnosis not present

## 2022-08-16 DIAGNOSIS — Z780 Asymptomatic menopausal state: Secondary | ICD-10-CM | POA: Diagnosis not present

## 2022-08-16 DIAGNOSIS — Z1231 Encounter for screening mammogram for malignant neoplasm of breast: Secondary | ICD-10-CM | POA: Diagnosis not present

## 2022-08-16 DIAGNOSIS — Z13820 Encounter for screening for osteoporosis: Secondary | ICD-10-CM

## 2022-09-20 ENCOUNTER — Encounter: Payer: Self-pay | Admitting: Internal Medicine

## 2022-09-20 DIAGNOSIS — M810 Age-related osteoporosis without current pathological fracture: Secondary | ICD-10-CM | POA: Insufficient documentation

## 2022-10-23 ENCOUNTER — Ambulatory Visit: Payer: Medicare PPO | Admitting: Internal Medicine

## 2022-10-23 ENCOUNTER — Telehealth: Payer: Self-pay | Admitting: Radiology

## 2022-10-23 VITALS — BP 124/68 | HR 40 | Temp 98.0°F | Ht 61.0 in | Wt 171.0 lb

## 2022-10-23 DIAGNOSIS — E538 Deficiency of other specified B group vitamins: Secondary | ICD-10-CM | POA: Diagnosis not present

## 2022-10-23 DIAGNOSIS — M810 Age-related osteoporosis without current pathological fracture: Secondary | ICD-10-CM

## 2022-10-23 DIAGNOSIS — N951 Menopausal and female climacteric states: Secondary | ICD-10-CM | POA: Diagnosis not present

## 2022-10-23 DIAGNOSIS — E559 Vitamin D deficiency, unspecified: Secondary | ICD-10-CM | POA: Diagnosis not present

## 2022-10-23 DIAGNOSIS — R1319 Other dysphagia: Secondary | ICD-10-CM

## 2022-10-23 DIAGNOSIS — K219 Gastro-esophageal reflux disease without esophagitis: Secondary | ICD-10-CM

## 2022-10-23 MED ORDER — PANTOPRAZOLE SODIUM 40 MG PO TBEC: 40.0000 mg | DELAYED_RELEASE_TABLET | Freq: Every day | ORAL | 3 refills | Status: DC

## 2022-10-23 NOTE — Assessment & Plan Note (Signed)
Start on Prolia inj  Cont on Vit D

## 2022-10-23 NOTE — Assessment & Plan Note (Signed)
Start Prolia 

## 2022-10-23 NOTE — Progress Notes (Signed)
Subjective:  Patient ID: [name], female    DOB: [birth year]  Age: 77 y.o. MRN: [id number]  CC: No chief complaint on file.   HPI [name] presents for anxiety, HTN, CAD C/o GERD, chocking on food, coughing  Outpatient Medications Prior to Visit  Medication Sig Dispense Refill  . acetaminophen (TYLENOL) 500 MG tablet Take 2 tablets (1,000 mg total) by mouth every 6 (six) hours as needed for mild pain or moderate pain. 30 tablet 0  . ALPRAZolam (XANAX) 0.5 MG tablet Take 1 tablet (0.5 mg total) by mouth 2 (two) times daily as needed. 60 tablet 3  . aspirin 81 MG chewable tablet Chew 1 tablet (81 mg total) by mouth 2 (two) times daily. 30 tablet 0  . bisacodyl (DULCOLAX) 5 MG EC tablet Take 5 mg by mouth daily as needed for moderate constipation.    Marland Kitchen buPROPion (WELLBUTRIN XL) 150 MG 24 hr tablet Take 1 tablet (150 mg total) by mouth daily. 90 tablet 1  . Cholecalciferol (VITAMIN D) 2000 UNITS CAPS Take 2,000 Units by mouth daily.    . diclofenac Sodium (VOLTAREN) 1 % GEL Apply 1 Application topically 4 (four) times daily as needed (pain).    . Ferrous Sulfate (IRON PO) Take by mouth.    . fluticasone (FLONASE) 50 MCG/ACT nasal spray Place 2 sprays into both nostrils daily. (Patient taking differently: Place 2 sprays into both nostrils daily as needed for allergies.) 48 g 3  . gabapentin (NEURONTIN) 100 MG capsule Take 1 capsule (100 mg total) by mouth at bedtime. 90 capsule 3  . ibuprofen (ADVIL) 200 MG tablet Take 200 mg by mouth every 6 (six) hours as needed for moderate pain.    Marland Kitchen linaclotide (LINZESS) 290 MCG CAPS capsule Take 1 capsule (290 mcg total) by mouth daily. 30 capsule 11  . loratadine (CLARITIN) 10 MG tablet Take 1 tablet (10 mg total) by mouth daily. 30 tablet 5  . lovastatin (MEVACOR) 40 MG tablet Take 1 tablet (40 mg total) by mouth at bedtime. 90 tablet 3  . Melatonin 5 MG CAPS Take 5 mg by mouth at bedtime as needed (sleep).    . Multiple Vitamins-Minerals  (PRESERVISION AREDS 2) CAPS Take 1 capsule by mouth daily.    . ondansetron (ZOFRAN) 4 MG tablet Take 1 tablet (4 mg total) by mouth every 8 (eight) hours as needed for nausea. 20 tablet 0  . oxyCODONE (OXY IR/ROXICODONE) 5 MG immediate release tablet Take by mouth. Take by mouth once a day    . solifenacin (VESICARE) 10 MG tablet TAKE 1 TABLET (10 MG TOTAL) BY MOUTH DAILY. ANNUAL APPT IS DUE MUST SEE PROVIDER FOR FUTURE REFILLS 90 tablet 3  . traMADol (ULTRAM) 50 MG tablet Take 1-2 tablets (50-100 mg total) by mouth every 6 (six) hours as needed for severe pain or moderate pain. 40 tablet 0  . traZODone (DESYREL) 50 MG tablet Take 50 mg by mouth as needed for sleep.    . verapamil (CALAN-SR) 240 MG CR tablet Take 1 tablet (240 mg total) by mouth at bedtime. 90 tablet 3  . vitamin B-12 (CYANOCOBALAMIN) 500 MCG tablet Take 500 mcg by mouth daily.    Marland Kitchen omeprazole (PRILOSEC) 40 MG capsule Take 1 capsule (40 mg total) by mouth in the morning and at bedtime. 180 capsule 3   No facility-administered medications prior to visit.    ROS: Review of Systems  Constitutional:  Negative for activity change, appetite change,  chills, fatigue and unexpected weight change.  HENT:  Negative for congestion, mouth sores and sinus pressure.   Eyes:  Negative for visual disturbance.  Respiratory:  Positive for cough and choking. Negative for chest tightness.   Gastrointestinal:  Negative for abdominal pain and nausea.  Genitourinary:  Negative for difficulty urinating, frequency and vaginal pain.  Musculoskeletal:  Negative for back pain and gait problem.  Skin:  Negative for pallor and rash.  Neurological:  Negative for dizziness, tremors, weakness, numbness and headaches.  Psychiatric/Behavioral:  Negative for confusion and sleep disturbance.     Objective:  BP 124/68 (BP Location: Left Arm, Patient Position: Sitting, Cuff Size: Normal)   Pulse (!) 40   Temp 98 F (36.7 C) (Oral)   Ht 5\' 1"  (1.549 m)    Wt 171 lb (77.6 kg)   SpO2 95%   BMI 32.31 kg/m   BP Readings from Last 3 Encounters:  10/23/22 124/68  06/22/22 132/70  03/08/22 122/70    Wt Readings from Last 3 Encounters:  10/23/22 171 lb (77.6 kg)  06/22/22 163 lb 12.8 oz (74.3 kg)  03/08/22 165 lb 9.6 oz (75.1 kg)    Physical Exam Constitutional:      General: She is not in acute distress.    Appearance: She is well-developed. She is obese.  HENT:     Head: Normocephalic.     Right Ear: External ear normal.     Left Ear: External ear normal.     Nose: Nose normal.  Eyes:     General:        Right eye: No discharge.        Left eye: No discharge.     Conjunctiva/sclera: Conjunctivae normal.     Pupils: Pupils are equal, round, and reactive to light.  Neck:     Thyroid: No thyromegaly.     Vascular: No JVD.     Trachea: No tracheal deviation.  Cardiovascular:     Rate and Rhythm: Normal rate and regular rhythm.     Heart sounds: Normal heart sounds.  Pulmonary:     Effort: No respiratory distress.     Breath sounds: No stridor. No wheezing.  Abdominal:     General: Bowel sounds are normal. There is no distension.     Palpations: Abdomen is soft. There is no mass.     Tenderness: There is no abdominal tenderness. There is no guarding or rebound.  Musculoskeletal:        General: No tenderness.     Cervical back: Normal range of motion and neck supple. No rigidity.  Lymphadenopathy:     Cervical: No cervical adenopathy.  Skin:    Findings: No erythema or rash.  Neurological:     Cranial Nerves: No cranial nerve deficit.     Motor: No abnormal muscle tone.     Coordination: Coordination normal.     Deep Tendon Reflexes: Reflexes normal.  Psychiatric:        Behavior: Behavior normal.        Thought Content: Thought content normal.        Judgment: Judgment normal.    Lab Results  Component Value Date   WBC 7.1 01/08/2022   HGB 11.8 (L) 01/08/2022   HCT 37.1 01/08/2022   PLT 257 01/08/2022    GLUCOSE 90 01/08/2022   CHOL 141 08/31/2021   TRIG 54 08/31/2021   HDL 48 08/31/2021   LDLDIRECT 111.0 02/04/2019   LDLCALC 82 08/31/2021  ALT 10 01/08/2022   AST 10 (L) 01/08/2022   NA 138 01/08/2022   K 3.9 01/08/2022   CL 108 01/08/2022   CREATININE 0.76 01/08/2022   BUN 11 01/08/2022   CO2 25 01/08/2022   TSH 0.504 08/31/2021   INR 1.1 08/31/2021   HGBA1C 5.6 08/31/2021    MM 3D SCREEN BREAST BILATERAL  Result Date: 08/20/2022 CLINICAL DATA:  Screening. EXAM: DIGITAL SCREENING BILATERAL MAMMOGRAM WITH TOMOSYNTHESIS AND CAD TECHNIQUE: Bilateral screening digital craniocaudal and mediolateral oblique mammograms were obtained. Bilateral screening digital breast tomosynthesis was performed. The images were evaluated with computer-aided detection. COMPARISON:  Previous exam(s). ACR Breast Density Category b: There are scattered areas of fibroglandular density. FINDINGS: There are no findings suspicious for malignancy. IMPRESSION: No mammographic evidence of malignancy. A result letter of this screening mammogram will be mailed directly to the patient. RECOMMENDATION: Screening mammogram in one year. (Code:SM-B-01Y) BI-RADS CATEGORY  1: Negative. Electronically Signed   By: Jacob Moores [name]   On: 08/20/2022 13:38   DG Bone Density  Result Date: 08/16/2022 EXAM: DUAL X-RAY ABSORPTIOMETRY (DXA) FOR BONE MINERAL DENSITY IMPRESSION: Referring Physician:  Bobette Mo BURNS Your patient completed a bone mineral density test using GE Lunar iDXA system (analysis version: 16). Technologist:    lmn PATIENT: Name: Joye, Dorado Patient ID: [id number] Birth Date: 03/13/1946 Height: 59.8 in. Sex: Female Measured: 08/16/2022 Weight: 167.6 lbs. Indications: Advanced Age, Caucasian, Estrogen Deficient, Gabapentin, History of Fracture (Adult) (V15.51), Omeprazole, Postmenopausal, Wellbutrin Fractures: Finger, Toe Treatments: Calcium (E943.0), Vitamin D (E933.5) ASSESSMENT: The BMD measured at Femur Neck Right  is 0.696 g/cm2 with a T-score of -2.5. This patient is considered osteoporotic according to World Health Organization [facility]) criteria. The quality of the exam is good. L3 was excluded due to degenerative changes. Site Region Measured Date Measured Age [age]A BMD Significant CHANGE T-score DualFemur Neck Right 08[age]16/2022    76.8         -2.5    0.696 g/cm2 AP Spine  L1-L4 (L3) 8/16/2022    76.8         -0.8    1.071 g/cm2 DualFemur Total Mean 08[age]16/2022    76.8         -1.9    0.774 g/cm2 World Health Organization [facility]) criteria for post-menopausal, Caucasian Women: Normal       T-score at or above -1 SD Osteopenia   T-score between -1 and -2.5 SD Osteoporosis T-score at or below -2.5 SD RECOMMENDATION: 1. All patients should optimize calcium and vitamin D intake. 2. Consider FDA-approved medical therapies in postmenopausal women and men aged 61 years and older, based on the following: a. A hip or vertebral (clinical or morphometric) fracture. b. T-score = -2.5 at the femoral neck or spine after appropriate evaluation to exclude secondary causes. c. Low bone mass (T-score between -1.0 and -2.5 at the femoral neck or spine) and a 10-year probability of a hip fracture = 3% or a 10-year probability of a major osteoporosis-related fracture = 20% based on the [place]-adapted WHO algorithm. d. Clinician judgment and/or patient preferences may indicate treatment for people with 10-year fracture probabilities above or below these levels. FOLLOW-UP: Patients with diagnosis of osteoporosis or at high risk for fracture should have regular bone mineral density tests.? Patients eligible for Medicare are allowed routine testing every 2 years.? The testing frequency can be increased to one year for patients who have rapidly progressing disease, are receiving or discontinuing medical therapy to restore bone mass, or  have additional risk factors. I have reviewed this study and agree with the findings. [facility] Electronically  Signed   By: Gerome Sam III [name]   On: 08/16/2022 16:02    Assessment & Plan:   Problem List Items Addressed This Visit     B12 deficiency - Primary    On B12      Vitamin D deficiency    On Vit D      GERD    Some dysphagia lately w/cough Continue on Prilosec GI ref S/p ENT eval      Relevant Medications   pantoprazole (PROTONIX) 40 MG tablet   Menopausal syndrome    Start Prolia      Osteoporosis    Start on Prolia inj  Cont on Vit D      Other Visit Diagnoses     Esophageal dysphagia       Relevant Orders   Ambulatory referral to Gastroenterology         Meds ordered this encounter  Medications  . pantoprazole (PROTONIX) 40 MG tablet    Sig: Take 1 tablet (40 mg total) by mouth daily.    Dispense:  180 tablet    Refill:  3      Follow-up: No follow-ups on file.  Sonda Primes, MD

## 2022-10-23 NOTE — Assessment & Plan Note (Signed)
On B12 

## 2022-10-23 NOTE — Assessment & Plan Note (Signed)
Some dysphagia lately w/cough Continue on Prilosec GI ref S/p ENT eval

## 2022-10-23 NOTE — Assessment & Plan Note (Signed)
On Vit D 

## 2022-10-24 DIAGNOSIS — R1319 Other dysphagia: Secondary | ICD-10-CM | POA: Insufficient documentation

## 2022-10-24 NOTE — Assessment & Plan Note (Signed)
Will switch to Protonix 40 mg twice a day.  GI appointment to see Dr. Marina Goodell

## 2022-10-26 ENCOUNTER — Other Ambulatory Visit (HOSPITAL_COMMUNITY): Payer: Self-pay

## 2022-10-26 ENCOUNTER — Telehealth: Payer: Self-pay

## 2022-10-26 ENCOUNTER — Encounter: Payer: Self-pay | Admitting: Internal Medicine

## 2022-10-26 NOTE — Telephone Encounter (Signed)
New encounter created for Prolia BIV. Will route back to pool once benefit verification is complete.

## 2022-10-26 NOTE — Telephone Encounter (Signed)
Prolia VOB initiated via [email or web address] 

## 2022-11-13 ENCOUNTER — Encounter: Payer: Self-pay | Admitting: Internal Medicine

## 2022-11-13 ENCOUNTER — Ambulatory Visit: Payer: Medicare PPO | Admitting: Internal Medicine

## 2022-11-13 VITALS — BP 134/80 | HR 80 | Ht 59.5 in | Wt 174.5 lb

## 2022-11-13 DIAGNOSIS — R058 Other specified cough: Secondary | ICD-10-CM | POA: Diagnosis not present

## 2022-11-13 DIAGNOSIS — K219 Gastro-esophageal reflux disease without esophagitis: Secondary | ICD-10-CM

## 2022-11-13 DIAGNOSIS — R1310 Dysphagia, unspecified: Secondary | ICD-10-CM | POA: Diagnosis not present

## 2022-11-13 DIAGNOSIS — Z86010 Personal history of colonic polyps: Secondary | ICD-10-CM

## 2022-11-13 NOTE — Progress Notes (Signed)
HISTORY OF PRESENT ILLNESS:  [name] is a 77 y.o. female with chronic GERD, history of peptic stricture requiring dilation, chronic constipation, morbid obesity status post gastroplasty with subsequent takedown, question short segment Barrett's esophagus.  She has not been seen in the office since 2017.  She also has a history of colon polyps and has completed a surveillance colonoscopy program with her last examination August 2020.  Last upper endoscopy with dilation (54 Je[address] Dilling) was November 2017.  She presents today with 2 complaints.  First, recurrent intermittent solid food and pill dysphagia as previous.  Worse over the past 6 months.  She feels she will benefit from repeat dilation.  She continues on pantoprazole 40 mg daily for control of reflux symptoms.  This works.  Next, she has a greater than 1 year sensation of coughing sensation when she eats certain foods, particularly dry foods.  This is embarrassing when she is out to eat.  She needs to have water on hand.  GI review of systems is otherwise negative except for constipation which is unchanged  Radiology: CT of the abdomen and pelvis with contrast March 2023 revealed no acute intra-abdominal or pelvic pathology.  Constipation noted  Laboratories: Comprehensive metabolic panel and CBC from January 08, 2022 unremarkable.  Hemoglobin 11.8  REVIEW OF SYSTEMS:  All non-GI ROS negative unless further quiescent stated in the HPI except for anxiety  Past Medical History:  Diagnosis Date   Anemia    Anxiety    B12 DEFICIENCY    Blood transfusion without reported diagnosis    Cataract    Cholecystitis, unspecified    Chronic sinusitis    DEPRESSION    Diastolic dysfunction    DIVERTICULOSIS, COLON    DYSLIPIDEMIA    Dysrhythmia    PVC's   Fibromyalgia    GERD    GERD (gastroesophageal reflux disease)    Hyperplastic colon polyp    HYPERTENSION    INSOMNIA, PERSISTENT    Morbid obesity (HCC)    OSTEOARTHRITIS     Osteoporosis    PONV (postoperative nausea and vomiting)    Skin cancer 2018   upper lip    SYNCOPE    Ulcer of esophagus    VITAMIN D DEFICIENCY     Past Surgical History:  Procedure Laterality Date   ABDOMINAL HYSTERECTOMY     APPENDECTOMY     BREAST BIOPSY Left 2010   Stero biopsy   BREAST EXCISIONAL BIOPSY Left 1985   Benign    CESAREAN SECTION     CHOLECYSTECTOMY     COLONOSCOPY     GASTROPLASTY     gatric stapling     LAPAROSCOPY N/A 01/30/2017   Procedure: LAPAROSCOPY DIAGNOSTIC WITH LAPAROSCOPIC LYSIS OF ADHESIONS;  Surgeon: Glenna Fellows, MD;  Location: [facility];  Service: General;  Laterality: N/A;   MIDDLE EAR SURGERY Right    NASAL SINUS SURGERY Left 08/06/2018   Procedure: Endoscopic SPHENOIDECTOMY;  Surgeon: Serena Colonel, MD;  Location: [facility];  Service: ENT;  Laterality: Left;   revearsal of gastric stapling     TONSILLECTOMY     TOTAL KNEE ARTHROPLASTY     rt.   TOTAL KNEE REVISION Right 01/22/2022   Procedure: TOTAL KNEE REVISION;  Surgeon: Dannielle Huh, MD;  Location: [facility];  Service: Orthopedics;  Laterality: Right;   TUBAL LIGATION     TYMPANOPLASTY Right 10/22/2016   Procedure: RIGHT TYMPANOPLASTY;  Surgeon: Serena Colonel, MD;  Location:  SURGERY CENTER;  Service: ENT;  Laterality: Right;   TYMPANOPLASTY Right 09/02/2017   Procedure: REVISION RIGHT TYMPANOPLASTY;  Surgeon: Serena Colonel, MD;  Location: [facility];  Service: ENT;  Laterality: Right;    Social History [name]  reports that she has never smoked. She has never used smokeless tobacco. She reports that she does not drink alcohol and does not use drugs.  family history includes Arthritis in her mother; Breast cancer (age of onset: 23) in her paternal aunt; COPD in her sister; Colon cancer in her maternal aunt, maternal uncle, and another family member; Colon polyps in her daughter; Diabetes in her daughter, maternal aunt, maternal uncle, paternal  uncle, sister, and sister; Emphysema in her father; Heart Problems in her daughter; Heart disease in her father and mother; Hypertension in her mother; Kidney disease in an other family member; Lung cancer in her sister; Stomach cancer in an other family member.  Allergies  Allergen Reactions   Oxycodone Other (See Comments)    Causes confusion, word finding difficulty   Levaquin [Levofloxacin] Nausea Only   Other Nausea And Vomiting    General anesthesia    Omnicef [Cefdinir] Diarrhea and Nausea And Vomiting       PHYSICAL EXAMINATION: Vital signs: BP 134/80 (BP Location: Left Arm, Patient Position: Sitting, Cuff Size: Normal)   Pulse 80   Ht 4' 11.5" (1.511 m) Comment: height measured without shoes  Wt 174 lb 8 oz (79.2 kg)   BMI 34.65 kg/m   Constitutional: generally well-appearing, no acute distress Psychiatric: alert and oriented x3, cooperative Eyes: extraocular movements intact, anicteric, conjunctiva pink Mouth: oral pharynx moist, no lesions Neck: supple no lymphadenopathy Cardiovascular: heart regular rate and rhythm, no murmur Lungs: clear to auscultation bilaterally Abdomen: soft, nontender, nondistended, no obvious ascites, no peritoneal signs, normal bowel sounds, no organomegaly Rectal: Omitted Extremities: no lower extremity edema bilaterally Skin: no lesions on visible extremities Neuro: No focal deficits.  Cranial nerves intact  ASSESSMENT:  1.  GERD.  Symptoms controlled on PPI 2.  Recurrent intermittent solid food and pill dysphagia likely secondary to known peptic stricture 3.  Cough reflex with flaky food in the pharyngeal area.  Reflexes.  Not worrisome 4.  History of colon polyps.  Aged out of surveillance 5.  Obesity.  BMI 35  PLAN:  1.  Reflux precautions 2.  Continue pantoprazole. 3.  Schedule upper endoscopy with esophageal dilation.  Patient is higher than baseline risk given her age, BMI, and comorbidities.  The nature of the procedure, as  well as the risks, benefits, and alternatives were carefully and thoroughly reviewed with the patient. Ample time for discussion and questions allowed. The patient understood, was satisfied, and agreed to proceed. 4.  Ongoing general medical care with Dr. Posey Rea

## 2022-11-13 NOTE — Patient Instructions (Signed)
You have been scheduled for an endoscopy. Please follow written instructions given to you at your visit today. If you use inhalers (even only as needed), please bring them with you on the day of your procedure.  _______________________________________________________  If your blood pressure at your visit was 140/90 or greater, please contact your primary care physician to follow up on this.  _______________________________________________________  If you are age 68 or older, your body mass index should be between 23-30. Your Body mass index is 34.65 kg/m. If this is out of the aforementioned range listed, please consider follow up with your Primary Care Provider.  If you are age 51 or younger, your body mass index should be between 19-25. Your Body mass index is 34.65 kg/m. If this is out of the aformentioned range listed, please consider follow up with your Primary Care Provider.   ________________________________________________________  The [facility] providers would like to encourage you to use [facility] to communicate with providers for non-urgent requests or questions.  Due to long hold times on the telephone, sending your provider a message by [facility] may be a faster and more efficient way to get a response.  Please allow 48 business hours for a response.  Please remember that this is for non-urgent requests.  _______________________________________________________

## 2022-12-07 ENCOUNTER — Encounter: Payer: Self-pay | Admitting: Internal Medicine

## 2022-12-07 ENCOUNTER — Ambulatory Visit (AMBULATORY_SURGERY_CENTER): Payer: Medicare PPO | Admitting: Internal Medicine

## 2022-12-07 VITALS — BP 151/93 | HR 67 | Temp 98.0°F | Resp 12 | Ht 59.0 in | Wt 174.0 lb

## 2022-12-07 DIAGNOSIS — R1310 Dysphagia, unspecified: Secondary | ICD-10-CM | POA: Diagnosis not present

## 2022-12-07 DIAGNOSIS — F419 Anxiety disorder, unspecified: Secondary | ICD-10-CM | POA: Diagnosis not present

## 2022-12-07 DIAGNOSIS — K317 Polyp of stomach and duodenum: Secondary | ICD-10-CM | POA: Diagnosis not present

## 2022-12-07 DIAGNOSIS — F32A Depression, unspecified: Secondary | ICD-10-CM | POA: Diagnosis not present

## 2022-12-07 DIAGNOSIS — K222 Esophageal obstruction: Secondary | ICD-10-CM | POA: Diagnosis not present

## 2022-12-07 DIAGNOSIS — K219 Gastro-esophageal reflux disease without esophagitis: Secondary | ICD-10-CM

## 2022-12-07 DIAGNOSIS — I493 Ventricular premature depolarization: Secondary | ICD-10-CM | POA: Diagnosis not present

## 2022-12-07 MED ORDER — SODIUM CHLORIDE 0.9 % IV SOLN: 500.0000 mL | Freq: Once | INTRAVENOUS | Status: DC

## 2022-12-07 NOTE — Progress Notes (Signed)
Sedate, gd SR, tolerated procedure well, VSS, report to RN 

## 2022-12-07 NOTE — Progress Notes (Signed)
Called to room to assist during endoscopic procedure.  Patient ID and intended procedure confirmed with present staff. Received instructions for my participation in the procedure from the performing physician.  

## 2022-12-07 NOTE — Progress Notes (Signed)
HISTORY OF PRESENT ILLNESS:  [name] is a 77 y.o. female with chronic GERD, history of peptic stricture requiring dilation, chronic constipation, morbid obesity status post gastroplasty with subsequent takedown, question short segment Barrett's esophagus.  She has not been seen in the office since 2017.  She also has a history of colon polyps and has completed a surveillance colonoscopy program with her last examination August 2020.  Last upper endoscopy with dilation (54 French Maloney) was November 2017.  She presents today with 2 complaints.  First, recurrent intermittent solid food and pill dysphagia as previous.  Worse over the past 6 months.  She feels she will benefit from repeat dilation.  She continues on pantoprazole 40 mg daily for control of reflux symptoms.  This works.  Next, she has a greater than 1 year sensation of coughing sensation when she eats certain foods, particularly dry foods.  This is embarrassing when she is out to eat.  She needs to have water on hand.  GI review of systems is otherwise negative except for constipation which is unchanged  Radiology: CT of the abdomen and pelvis with contrast March 2023 revealed no acute intra-abdominal or pelvic pathology.  Constipation noted  Laboratories: Comprehensive metabolic panel and CBC from January 08, 2022 unremarkable.  Hemoglobin 11.8  REVIEW OF SYSTEMS:  All non-GI ROS negative unless further quiescent stated in the HPI except for anxiety  Past Medical History:  Diagnosis Date   Anemia    Anxiety    B12 DEFICIENCY    Blood transfusion without reported diagnosis    Cataract    Cholecystitis, unspecified    Chronic sinusitis    DEPRESSION    Diastolic dysfunction    DIVERTICULOSIS, COLON    DYSLIPIDEMIA    Dysrhythmia    PVC's   Fibromyalgia    GERD    GERD (gastroesophageal reflux disease)    Hyperplastic colon polyp    HYPERTENSION    INSOMNIA, PERSISTENT    Morbid obesity (HCC)    OSTEOARTHRITIS     Osteoporosis    PONV (postoperative nausea and vomiting)    Skin cancer 2018   upper lip    SYNCOPE    Ulcer of esophagus    VITAMIN D DEFICIENCY     Past Surgical History:  Procedure Laterality Date   ABDOMINAL HYSTERECTOMY     APPENDECTOMY     BREAST BIOPSY Left 2010   Stero biopsy   BREAST EXCISIONAL BIOPSY Left 1985   Benign    CESAREAN SECTION     CHOLECYSTECTOMY     COLONOSCOPY     GASTROPLASTY     gatric stapling     LAPAROSCOPY N/A 01/30/2017   Procedure: LAPAROSCOPY DIAGNOSTIC WITH LAPAROSCOPIC LYSIS OF ADHESIONS;  Surgeon: Hoxworth, Benjamin, MD;  Location: [facility];  Service: General;  Laterality: N/A;   MIDDLE EAR SURGERY Right    NASAL SINUS SURGERY Left 08/06/2018   Procedure: Endoscopic SPHENOIDECTOMY;  Surgeon: Rosen, Jefry, MD;  Location: [facility];  Service: ENT;  Laterality: Left;   revearsal of gastric stapling     TONSILLECTOMY     TOTAL KNEE ARTHROPLASTY     rt.   TOTAL KNEE REVISION Right 01/22/2022   Procedure: TOTAL KNEE REVISION;  Surgeon: Lucey, Steve, MD;  Location: [facility];  Service: Orthopedics;  Laterality: Right;   TUBAL LIGATION     TYMPANOPLASTY Right 10/22/2016   Procedure: RIGHT TYMPANOPLASTY;  Surgeon: Rosen, Jefry, MD;  Location: [facility];    Service: ENT;  Laterality: Right;   TYMPANOPLASTY Right 09/02/2017   Procedure: REVISION RIGHT TYMPANOPLASTY;  Surgeon: Rosen, Jefry, MD;  Location: [facility];  Service: ENT;  Laterality: Right;    Social History Lea O Redfield  reports that she has never smoked. She has never used smokeless tobacco. She reports that she does not drink alcohol and does not use drugs.  family history includes Arthritis in her mother; Breast cancer (age of onset: 65) in her paternal aunt; COPD in her sister; Colon cancer in her maternal aunt, maternal uncle, and another family member; Colon polyps in her daughter; Diabetes in her daughter, maternal aunt, maternal uncle, paternal  uncle, sister, and sister; Emphysema in her father; Heart Problems in her daughter; Heart disease in her father and mother; Hypertension in her mother; Kidney disease in an other family member; Lung cancer in her sister; Stomach cancer in an other family member.  Allergies  Allergen Reactions   Oxycodone Other (See Comments)    Causes confusion, word finding difficulty   Levaquin [Levofloxacin] Nausea Only   Other Nausea And Vomiting    General anesthesia    Omnicef [Cefdinir] Diarrhea and Nausea And Vomiting       PHYSICAL EXAMINATION: Vital signs: BP 134/80 (BP Location: Left Arm, Patient Position: Sitting, Cuff Size: Normal)   Pulse 80   Ht 4' 11.5" (1.511 m) Comment: height measured without shoes  Wt 174 lb 8 oz (79.2 kg)   BMI 34.65 kg/m   Constitutional: generally well-appearing, no acute distress Psychiatric: alert and oriented x3, cooperative Eyes: extraocular movements intact, anicteric, conjunctiva pink Mouth: oral pharynx moist, no lesions Neck: supple no lymphadenopathy Cardiovascular: heart regular rate and rhythm, no murmur Lungs: clear to auscultation bilaterally Abdomen: soft, nontender, nondistended, no obvious ascites, no peritoneal signs, normal bowel sounds, no organomegaly Rectal: Omitted Extremities: no lower extremity edema bilaterally Skin: no lesions on visible extremities Neuro: No focal deficits.  Cranial nerves intact  ASSESSMENT:  1.  GERD.  Symptoms controlled on PPI 2.  Recurrent intermittent solid food and pill dysphagia likely secondary to known peptic stricture 3.  Cough reflex with flaky food in the pharyngeal area.  Reflexes.  Not worrisome 4.  History of colon polyps.  Aged out of surveillance 5.  Obesity.  BMI 35  PLAN:  1.  Reflux precautions 2.  Continue pantoprazole. 3.  Schedule upper endoscopy with esophageal dilation.  Patient is higher than baseline risk given her age, BMI, and comorbidities.  The nature of the procedure, as  well as the risks, benefits, and alternatives were carefully and thoroughly reviewed with the patient. Ample time for discussion and questions allowed. The patient understood, was satisfied, and agreed to proceed. 4.  Ongoing general medical care with Dr. Plotnikov      

## 2022-12-07 NOTE — Op Note (Signed)
[facility] Patient Name: [name] Procedure Date: 12/07/2022 12:56 PM MRN: [id number] Endoscopist: [name]. Marina Goodell , MD, [id number] Age: 77 Referring MD:  Date of Birth: 19-Sep-1945 Gender: Female Account #: [id number] Procedure:                Upper GI endoscopy with [facility] dilation of the                            esophagus. [address] [place] Indications:              Dysphagia, Therapeutic procedure, Esophageal reflux Medicines:                Monitored Anesthesia Care Procedure:                Pre-Anesthesia Assessment:                           - Prior to the procedure, a History and Physical                            was performed, and patient medications and                            allergies were reviewed. The patient's tolerance of                            previous anesthesia was also reviewed. The risks                            and benefits of the procedure and the sedation                            options and risks were discussed with the patient.                            All questions were answered, and informed consent                            was obtained. Prior Anticoagulants: The patient has                            taken no anticoagulant or antiplatelet agents. ASA                            Grade Assessment: II - A patient with mild systemic                            disease. After reviewing the risks and benefits,                            the patient was deemed in satisfactory condition to                            undergo the procedure.  After obtaining informed consent, the endoscope was                            passed under direct vision. Throughout the                            procedure, the patient's blood pressure, pulse, and                            oxygen saturations were monitored continuously. The                            GIF [id number] #[id number] was introduced through the                            mouth,  and advanced to the second part of duodenum.                            The upper GI endoscopy was accomplished without                            difficulty. The patient tolerated the procedure                            well. Scope In: Scope Out: Findings:                 One benign-appearing, intrinsic moderate stenosis                            was found 31 cm from the incisors. This stenosis                            measured 1.5 cm (inner diameter). The scope was                            withdrawn. Dilation was performed with a Maloney                            dilator with no resistance at 54 Fr.                           The exam of the esophagus was otherwise normal.                           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 A few benign fundic gland polyps.                            Otherwise normal.                           The examined duodenum was normal.  The cardia and gastric fundus were normal on                            retroflexion. Complications:            No immediate complications. Estimated Blood Loss:     Estimated blood loss: none. Impression:               - Benign-appearing esophageal stenosis. Dilated.                           - Prior sleeve gastrectomy. Benign fundic gland                            polyps.                           - Normal examined duodenum.                           - No specimens collected. Recommendation:           - Patient has a contact number available for                            emergencies. The signs and symptoms of potential                            delayed complications were discussed with the                            patient. Return to normal activities tomorrow.                            Written discharge instructions were provided to the                            patient.                           -Post dilation diet.                            - Continue present medications. [name]. Marina Goodell, MD 12/07/2022 1:10:18 PM This report has been signed electronically.

## 2022-12-07 NOTE — Patient Instructions (Signed)
Post dilation Diet provided.  YOU HAD AN ENDOSCOPIC PROCEDURE TODAY AT THE [facility]:   Refer to the procedure report that was given to you for any specific questions about what was found during the examination.  If the procedure report does not answer your questions, please call your gastroenterologist to clarify.  If you requested that your care partner not be given the details of your procedure findings, then the procedure report has been included in a sealed envelope for you to review at your convenience later.  YOU SHOULD EXPECT: Some feelings of bloating in the abdomen. Passage of more gas than usual.  Walking can help get rid of the air that was put into your GI tract during the procedure and reduce the bloating. If you had a lower endoscopy (such as a colonoscopy or flexible sigmoidoscopy) you may notice spotting of blood in your stool or on the toilet paper. If you underwent a bowel prep for your procedure, you may not have a normal bowel movement for a few days.  Please Note:  You might notice some irritation and congestion in your nose or some drainage.  This is from the oxygen used during your procedure.  There is no need for concern and it should clear up in a day or so.  SYMPTOMS TO REPORT IMMEDIATELY:  Following upper endoscopy (EGD)  Vomiting of blood or coffee ground material  New chest pain or pain under the shoulder blades  Painful or persistently difficult swallowing  New shortness of breath  Fever of 100F or higher  Black, tarry-looking stools  For urgent or emergent issues, a gastroenterologist can be reached at any hour by calling [phone]. Do not use MyChart messaging for urgent concerns.    DIET:  Please see Post dilation diet provided.  Drink plenty of fluids but you should avoid alcoholic beverages for 24 hours.  ACTIVITY:  You should plan to take it easy for the rest of today and you should NOT DRIVE or use heavy machinery until tomorrow  (because of the sedation medicines used during the test).    FOLLOW UP: Our staff will call the number listed on your records the next business day following your procedure.  We will call around 7:15- 8:00 am to check on you and address any questions or concerns that you may have regarding the information given to you following your procedure. If we do not reach you, we will leave a message.     If any biopsies were taken you will be contacted by phone or by letter within the next 1-3 weeks.  Please call us at [phone] if you have not heard about the biopsies in 3 weeks.    SIGNATURES/CONFIDENTIALITY: You and/or your care partner have signed paperwork which will be entered into your electronic medical record.  These signatures attest to the fact that that the information above on your After Visit Summary has been reviewed and is understood.  Full responsibility of the confidentiality of this discharge information lies with you and/or your care-partner.

## 2022-12-11 ENCOUNTER — Telehealth: Payer: Self-pay | Admitting: *Deleted

## 2022-12-11 NOTE — Telephone Encounter (Signed)
  Follow up Call-     12/07/2022   12:36 PM  Call back number  Post procedure Call Back phone  # [phone]  Permission to leave phone message Yes     Patient questions:  Do you have a fever, pain , or abdominal swelling? No. Pain Score  0 *  Have you tolerated food without any problems? Yes.    Have you been able to return to your normal activities? Yes.    Do you have any questions about your discharge instructions: Diet   No. Medications  No. Follow up visit  No.  Do you have questions or concerns about your Care? No.  Actions: * If pain score is 4 or above: No action needed, pain <4.

## 2022-12-26 ENCOUNTER — Ambulatory Visit (INDEPENDENT_AMBULATORY_CARE_PROVIDER_SITE_OTHER): Payer: Medicare PPO

## 2022-12-26 VITALS — Ht 60.0 in | Wt 169.0 lb

## 2022-12-26 DIAGNOSIS — Z0000 Encounter for general adult medical examination without abnormal findings: Secondary | ICD-10-CM | POA: Diagnosis not present

## 2022-12-26 NOTE — Progress Notes (Addendum)
Subjective:   [name] is a 77 y.o. female who presents for Medicare Annual (Subsequent) preventive examination.  Visit Complete: Virtual  I connected with  [name] on 12/26/22 by a audio enabled telemedicine application and verified that I am speaking with the correct person using two identifiers.  Patient Location: Home  Provider Location: Home Office  I discussed the limitations of evaluation and management by telemedicine. The patient expressed understanding and agreed to proceed.  Patient Medicare AWV questionnaire was completed by the patient on 12/26/2022; I have confirmed that all information answered by patient is correct and no changes since this date.  Review of Systems     Cardiac Risk Factors include: advanced age (>79men, >13 women);dyslipidemia;hypertension     Objective:    Today's Vitals   12/26/22 1201  Weight: 169 lb (76.7 kg)  Height: 5' (1.524 m)   Body mass index is 33.01 kg/m.     12/26/2022   12:04 PM 01/22/2022    3:51 PM 01/08/2022    1:53 PM 01/05/2022    4:19 PM 09/04/2021    8:45 AM 08/31/2021    5:15 PM 07/15/2021    5:04 PM  Advanced Directives  Does Patient Have a Medical Advance Directive? Yes No No No  No No  Type of Estate agent of [place];Living will        Copy of Healthcare Power of Attorney in Chart? No - copy requested        Would patient like information on creating a medical advance directive?  No - Patient declined  No - Patient declined No - Guardian declined      Current Medications (verified) Outpatient Encounter Medications as of 12/26/2022  Medication Sig   acetaminophen (TYLENOL) 500 MG tablet Take 2 tablets (1,000 mg total) by mouth every 6 (six) hours as needed for mild pain or moderate pain.   ALPRAZolam (XANAX) 0.5 MG tablet Take 1 tablet (0.5 mg total) by mouth 2 (two) times daily as needed.   aspirin 81 MG chewable tablet Chew 1 tablet (81 mg total) by mouth 2 (two) times daily.    buPROPion (WELLBUTRIN XL) 150 MG 24 hr tablet Take 1 tablet (150 mg total) by mouth daily.   Cholecalciferol (VITAMIN D) 2000 UNITS CAPS Take 2,000 Units by mouth daily.   diclofenac Sodium (VOLTAREN) 1 % GEL Apply 1 Application topically 4 (four) times daily as needed (pain).   fluticasone (FLONASE) 50 MCG/ACT nasal spray Place 2 sprays into both nostrils daily. (Patient taking differently: Place 2 sprays into both nostrils daily as needed for allergies.)   gabapentin (NEURONTIN) 100 MG capsule Take 1 capsule (100 mg total) by mouth at bedtime.   ibuprofen (ADVIL) 200 MG tablet Take 200 mg by mouth every 6 (six) hours as needed for moderate pain.   linaclotide (LINZESS) 290 MCG CAPS capsule Take 1 capsule (290 mcg total) by mouth daily.   lovastatin (MEVACOR) 40 MG tablet Take 1 tablet (40 mg total) by mouth at bedtime.   Melatonin 5 MG CAPS Take 5 mg by mouth at bedtime as needed (sleep).   Multiple Vitamins-Minerals (PRESERVISION AREDS 2) CAPS Take 1 capsule by mouth daily.   ondansetron (ZOFRAN) 4 MG tablet Take 1 tablet (4 mg total) by mouth every 8 (eight) hours as needed for nausea.   pantoprazole (PROTONIX) 40 MG tablet Take 1 tablet (40 mg total) by mouth daily.   solifenacin (VESICARE) 10 MG tablet TAKE 1 TABLET (  10 MG TOTAL) BY MOUTH DAILY. ANNUAL APPT IS DUE MUST SEE PROVIDER FOR FUTURE REFILLS   traZODone (DESYREL) 50 MG tablet Take 50 mg by mouth as needed for sleep.   verapamil (CALAN-SR) 240 MG CR tablet Take 1 tablet (240 mg total) by mouth at bedtime.   vitamin B-12 (CYANOCOBALAMIN) 500 MCG tablet Take 500 mcg by mouth daily.   No facility-administered encounter medications on file as of 12/26/2022.    Allergies (verified) Oxycodone, Levaquin [levofloxacin], Other, and Omnicef [cefdinir]   History: Past Medical History:  Diagnosis Date   Anemia    Anxiety    B12 DEFICIENCY    Blood transfusion without reported diagnosis    Cataract    Cholecystitis, unspecified     Chronic sinusitis    DEPRESSION    Diastolic dysfunction    DIVERTICULOSIS, COLON    DYSLIPIDEMIA    Dysrhythmia    PVC's   Fibromyalgia    GERD    GERD (gastroesophageal reflux disease)    Hyperplastic colon polyp    HYPERTENSION    INSOMNIA, PERSISTENT    Morbid obesity (HCC)    OSTEOARTHRITIS    Osteoporosis    PONV (postoperative nausea and vomiting)    Skin cancer 2018   upper lip    SYNCOPE    Ulcer of esophagus    VITAMIN D DEFICIENCY    Past Surgical History:  Procedure Laterality Date   ABDOMINAL HYSTERECTOMY     APPENDECTOMY     BREAST BIOPSY Left 2010   Stero biopsy   BREAST EXCISIONAL BIOPSY Left 1985   Benign    CESAREAN SECTION     CHOLECYSTECTOMY     COLONOSCOPY     GASTROPLASTY     gatric stapling     LAPAROSCOPY N/A 01/30/2017   Procedure: LAPAROSCOPY DIAGNOSTIC WITH LAPAROSCOPIC LYSIS OF ADHESIONS;  Surgeon: Glenna Fellows, MD;  Location: [facility];  Service: General;  Laterality: N/A;   MIDDLE EAR SURGERY Right    NASAL SINUS SURGERY Left 08/06/2018   Procedure: Endoscopic SPHENOIDECTOMY;  Surgeon: Serena Colonel, MD;  Location: [facility];  Service: ENT;  Laterality: Left;   revearsal of gastric stapling     TONSILLECTOMY     TOTAL KNEE ARTHROPLASTY     rt.   TOTAL KNEE REVISION Right 01/22/2022   Procedure: TOTAL KNEE REVISION;  Surgeon: Dannielle Huh, MD;  Location: [facility];  Service: Orthopedics;  Laterality: Right;   TUBAL LIGATION     TYMPANOPLASTY Right 10/22/2016   Procedure: RIGHT TYMPANOPLASTY;  Surgeon: Serena Colonel, MD;  Location: [facility];  Service: ENT;  Laterality: Right;   TYMPANOPLASTY Right 09/02/2017   Procedure: REVISION RIGHT TYMPANOPLASTY;  Surgeon: Serena Colonel, MD;  Location: [facility];  Service: ENT;  Laterality: Right;   Family History  Problem Relation Age of Onset   Arthritis Mother    Hypertension Mother        pulmonary   Heart disease Mother    Emphysema Father    Heart  disease Father    Diabetes Sister    Lung cancer Sister    Diabetes Sister    COPD Sister    Heart Problems Daughter    Diabetes Daughter    Colon polyps Daughter    Colon cancer Maternal Aunt    Diabetes Maternal Aunt        x 4   Colon cancer Maternal Uncle    Diabetes Maternal Uncle  x 3   Breast cancer Paternal Aunt 53   Diabetes Paternal Uncle         1   Colon cancer Other        nephew   Stomach cancer Other    Kidney disease Other        niece   Esophageal cancer Neg Hx    Liver disease Neg Hx    Rectal cancer Neg Hx    Social History   Socioeconomic History   Marital status: Widowed    Spouse name: Not on file   Number of children: 2   Years of education: Not on file   Highest education level: Some college, no degree  Occupational History   Occupation: retired  Tobacco Use   Smoking status: Never   Smokeless tobacco: Never  Vaping Use   Vaping Use: Never used  Substance and Sexual Activity   Alcohol use: No   Drug use: No   Sexual activity: Not on file  Other Topics Concern   Not on file  Social History Narrative   Patient is right-handed. She lives with her husband in a one level home. She drinks 1-2 diet Cokes a day and rarely drinks coffee. She does not exercise.   Social Determinants of Health   Financial Resource Strain: Low Risk  (12/26/2022)   Overall Financial Resource Strain (CARDIA)    Difficulty of Paying Living Expenses: Not hard at all  Food Insecurity: No Food Insecurity (12/26/2022)   Hunger Vital Sign    Worried About Running Out of Food in the Last Year: Never true    Ran Out of Food in the Last Year: Never true  Transportation Needs: No Transportation Needs (12/26/2022)   PRAPARE - Administrator, Civil Service (Medical): No    Lack of Transportation (Non-Medical): No  Physical Activity: Insufficiently Active (12/26/2022)   Exercise Vital Sign    Days of Exercise per Week: 3 days    Minutes of Exercise per  Session: 30 min  Stress: No Stress Concern Present (12/26/2022)   [employer] of Occupational Health - Occupational Stress Questionnaire    Feeling of Stress : Not at all  Social Connections: Moderately Integrated (12/26/2022)   Social Connection and Isolation Panel [NHANES]    Frequency of Communication with Friends and Family: More than three times a week    Frequency of Social Gatherings with Friends and Family: More than three times a week    Attends Religious Services: More than 4 times per year    Active Member of [employer] or Organizations: Yes    Attends Banker Meetings: More than 4 times per year    Marital Status: Widowed    Tobacco Counseling Counseling given: Not Answered   Clinical Intake:  Pre-visit preparation completed: Yes  Pain : No/denies pain     Nutritional Risks: None Diabetes: No  How often do you need to have someone help you when you read instructions, pamphlets, or other written materials from your doctor or pharmacy?: 1 - Never  Interpreter Needed?: No  Information entered by :: Renie Ora, LPN   Activities of Daily Living    12/26/2022   12:04 PM 12/26/2022   11:39 AM  In your present state of health, do you have any difficulty performing the following activities:  Hearing? 0 0  Vision? 0 0  Difficulty concentrating or making decisions? 0 0  Walking or climbing stairs? 0 0  Dressing or bathing? 0  0  Doing errands, shopping? 0 0  Preparing Food and eating ? N N  Using the Toilet? N N  In the past six months, have you accidently leaked urine? N Y  Do you have problems with loss of bowel control? N N  Managing your Medications? N N  Managing your Finances? N N  Housekeeping or managing your Housekeeping? N N    Patient Care Team: Plotnikov, Georgina Quint, MD as PCP - General Marina Goodell [name], MD (Gastroenterology) Pollyann Savoy, MD (Rheumatology) Shon Millet, MD as Consulting Physician (Ophthalmology)  Indicate any  recent Medical Services you may have received from other than [facility] providers in the past year (date may be approximate).     Assessment:   This is a routine wellness examination for [place].  Hearing/Vision screen Vision Screening - Comments:: Wears rx glasses - up to date with routine eye exams with  Dr.Glenn  Dietary issues and exercise activities discussed:     Goals Addressed             This Visit's Progress    My goal is to get my knee from hurting and stay healthy.   On track      Depression Screen    12/26/2022   12:03 PM 10/23/2022    3:57 PM 01/05/2022    4:18 PM 11/07/2021    4:04 PM 12/21/2020    8:42 AM 01/16/2017    9:16 AM 12/28/2015   10:11 AM  PHQ 2/9 Scores  PHQ - 2 Score 0 0 0 1 0 3 1  PHQ- 9 Score      11     Fall Risk    12/26/2022   12:02 PM 12/26/2022   11:39 AM 10/23/2022    3:57 PM 01/05/2022    4:06 PM 11/07/2021    4:04 PM  Fall Risk   Falls in the past year? 0 0 0 1 1  Number falls in past yr: 0 0 0 1 1  Injury with Fall? 0 1 0 1 1  Risk for fall due to : No Fall Risks  No Fall Risks    Follow up Falls prevention discussed  Falls evaluation completed      MEDICARE RISK AT HOME:  Medicare Risk at Home - 12/26/22 1202     Any stairs in or around the home? No    If so, are there any without handrails? No    Home free of loose throw rugs in walkways, pet beds, electrical cords, etc? Yes    Adequate lighting in your home to reduce risk of falls? Yes    Life alert? No    Use of a cane, walker or w/c? No    Grab bars in the bathroom? Yes    Shower chair or bench in shower? Yes    Elevated toilet seat or a handicapped toilet? Yes             TIMED UP AND GO:  Was the test performed?  No    Cognitive Function:        12/26/2022   12:04 PM 01/05/2022    4:21 PM  6CIT Screen  What Year? 0 points 0 points  What month? 0 points 0 points  What time? 0 points 0 points  Count back from 20 0 points 0 points  Months in reverse 0 points 0  points  Repeat phrase 0 points 0 points  Total Score 0 points 0 points  Immunizations Immunization History  Administered Date(s) Administered   Fluad Quad(high Dose 65+) 02/04/2019, 04/08/2020, 03/14/2021, 03/08/2022   Influenza Split 05/21/2011   Influenza Whole 03/23/2008, 03/23/2009, 03/01/2010   Influenza, High Dose Seasonal PF 04/21/2013, 02/23/2016, 02/27/2017, 02/26/2018   Influenza,inj,Quad PF,6+ Mos 02/26/2014   Influenza-Unspecified 05/25/2015   PFIZER(Purple Top)SARS-COV-2 Vaccination 08/02/2019, 08/24/2019   Pneumococcal Conjugate-13 01/16/2017   Pneumococcal Polysaccharide-23 06/16/2013   Td 10/14/2009   Tdap 07/15/2021   Zoster, Live 07/28/2010    TDAP status: Up to date  Flu Vaccine status: Up to date  Pneumococcal vaccine status: Up to date  Covid-19 vaccine status: Completed vaccines  Qualifies for Shingles Vaccine? Yes   Zostavax completed No   Shingrix Completed?: No.    Education has been provided regarding the importance of this vaccine. Patient has been advised to call insurance company to determine out of pocket expense if they have not yet received this vaccine. Advised may also receive vaccine at local pharmacy or Health Dept. Verbalized acceptance and understanding.  Screening Tests Health Maintenance  Topic Date Due   Zoster Vaccines- Shingrix (1 of 2) Never done   COVID-19 Vaccine (3 - Pfizer risk series) 09/21/2019   INFLUENZA VACCINE  01/17/2023   Medicare Annual Wellness (AWV)  12/26/2023   Colonoscopy  02/10/2024   DTaP/Tdap/Td (3 - Td or Tdap) 07/16/2031   Pneumonia Vaccine 63+ Years old  Completed   DEXA SCAN  Completed   Hepatitis C Screening  Completed   HPV VACCINES  Aged Out    Health Maintenance  Health Maintenance Due  Topic Date Due   Zoster Vaccines- Shingrix (1 of 2) Never done   COVID-19 Vaccine (3 - Pfizer risk series) 09/21/2019    Colorectal cancer screening: Type of screening: Colonoscopy. Completed 02/10/2019.  Repeat every 5 years  Mammogram status: No longer required due to age.  Bone Density status: Completed 08/16/2022. Results reflect: Bone density results: OSTEOPENIA. Repeat every 5 years.  Lung Cancer Screening: (Low Dose CT Chest recommended if Age 80-80 years, 20 pack-year currently smoking OR have quit w/in 15years.) does not qualify.   Lung Cancer Screening Referral: n/a  Additional Screening:  Hepatitis C Screening: does not qualify; Completed 12/28/2015  Vision Screening: Recommended annual ophthalmology exams for early detection of glaucoma and other disorders of the eye. Is the patient up to date with their annual eye exam?  Yes  Who is the provider or what is the name of the office in which the patient attends annual eye exams? Dr.glenn  If pt is not established with a provider, would they like to be referred to a provider to establish care? No .   Dental Screening: Recommended annual dental exams for proper oral hygiene   Community Resource Referral / Chronic Care Management: CRR required this visit?  No   CCM required this visit?  No     Plan:     I have personally reviewed and noted the following in the patient's chart:   Medical and social history Use of alcohol, tobacco or illicit drugs  Current medications and supplements including opioid prescriptions. Patient is not currently taking opioid prescriptions. Functional ability and status Nutritional status Physical activity Advanced directives List of other physicians Hospitalizations, surgeries, and ER visits in previous 12 months Vitals Screenings to include cognitive, depression, and falls Referrals and appointments  In addition, I have reviewed and discussed with patient certain preventive protocols, quality metrics, and best practice recommendations. A written personalized care plan for preventive services  as well as general preventive health recommendations were provided to patient.     Lorrene Reid, LPN   1/61/0960   After Visit Summary: (MyChart) Due to this being a telephonic visit, the after visit summary with patients personalized plan was offered to patient via MyChart   Nurse Notes: none   Medical screening examination/treatment/procedure(s) were performed by non-physician practitioner and as supervising physician I was immediately available for consultation/collaboration.  I agree with above. Jacinta Shoe, MD

## 2022-12-26 NOTE — Patient Instructions (Signed)
[name] , Thank you for taking time to come for your Medicare Wellness Visit. I appreciate your ongoing commitment to your health goals. Please review the following plan we discussed and let me know if I can assist you in the future.   These are the goals we discussed:  Goals       My goal is to get my knee from hurting and stay healthy.      Patient Stated (pt-stated)      My goal is to continue to lose weight by being physically active, watching my diet, cutting back on diet Coke and increasing my water intake.  I would like to lose 10-20 pounds this year.        This is a list of the screening recommended for you and due dates:  Health Maintenance  Topic Date Due   Zoster (Shingles) Vaccine (1 of 2) Never done   COVID-19 Vaccine (3 - Pfizer risk series) 09/21/2019   Flu Shot  01/17/2023   Medicare Annual Wellness Visit  12/26/2023   Colon Cancer Screening  02/10/2024   DTaP/Tdap/Td vaccine (3 - Td or Tdap) 07/16/2031   Pneumonia Vaccine  Completed   DEXA scan (bone density measurement)  Completed   Hepatitis C Screening  Completed   HPV Vaccine  Aged Out    Advanced directives: Advance directive discussed with you today. I have provided a copy for you to complete at home and have notarized. Once this is complete please bring a copy in to our office so we can scan it into your chart. Information on Advanced Care Planning can be found at [facility] Advance Health Care Directives Advance Health Care Directives ([email or web address]    Conditions/risks identified: Aim for 30 minutes of exercise or brisk walking, 6-8 glasses of water, and 5 servings of fruits and vegetables each day.   Next appointment: Follow up in one year for your annual wellness visit    Preventive Care 65 Years and Older, Female Preventive care refers to lifestyle choices and visits with your health care provider that can promote health and wellness. What does preventive care include? A yearly  physical exam. This is also called an annual well check. Dental exams once or twice a year. Routine eye exams. Ask your health care provider how often you should have your eyes checked. Personal lifestyle choices, including: Daily care of your teeth and gums. Regular physical activity. Eating a healthy diet. Avoiding tobacco and drug use. Limiting alcohol use. Practicing safe sex. Taking low-dose aspirin every day. Taking vitamin and mineral supplements as recommended by your health care provider. What happens during an annual well check? The services and screenings done by your health care provider during your annual well check will depend on your age, overall health, lifestyle risk factors, and family history of disease. Counseling  Your health care provider may ask you questions about your: Alcohol use. Tobacco use. Drug use. Emotional well-being. Home and relationship well-being. Sexual activity. Eating habits. History of falls. Memory and ability to understand (cognition). Work and work Astronomer. Reproductive health. Screening  You may have the following tests or measurements: Height, weight, and BMI. Blood pressure. Lipid and cholesterol levels. These may be checked every 5 years, or more frequently if you are over 40 years old. Skin check. Lung cancer screening. You may have this screening every year starting at age 32 if you have a 30-pack-year history of smoking and currently smoke or have quit within the  past 15 years. Fecal occult blood test (FOBT) of the stool. You may have this test every year starting at age 60. Flexible sigmoidoscopy or colonoscopy. You may have a sigmoidoscopy every 5 years or a colonoscopy every 10 years starting at age 48. Hepatitis C blood test. Hepatitis B blood test. Sexually transmitted disease (STD) testing. Diabetes screening. This is done by checking your blood sugar (glucose) after you have not eaten for a while (fasting). You may  have this done every 1-3 years. Bone density scan. This is done to screen for osteoporosis. You may have this done starting at age 70. Mammogram. This may be done every 1-2 years. Talk to your health care provider about how often you should have regular mammograms. Talk with your health care provider about your test results, treatment options, and if necessary, the need for more tests. Vaccines  Your health care provider may recommend certain vaccines, such as: Influenza vaccine. This is recommended every year. Tetanus, diphtheria, and acellular pertussis (Tdap, Td) vaccine. You may need a Td booster every 10 years. Zoster vaccine. You may need this after age 56. Pneumococcal 13-valent conjugate (PCV13) vaccine. One dose is recommended after age 78. Pneumococcal polysaccharide (PPSV23) vaccine. One dose is recommended after age 44. Talk to your health care provider about which screenings and vaccines you need and how often you need them. This information is not intended to replace advice given to you by your health care provider. Make sure you discuss any questions you have with your health care provider. Document Released: 07/01/2015 Document Revised: 02/22/2016 Document Reviewed: 04/05/2015 Elsevier Interactive Patient Education  2017 [employer].  Fall Prevention in the Home Falls can cause injuries. They can happen to people of all ages. There are many things you can do to make your home safe and to help prevent falls. What can I do on the outside of my home? Regularly fix the edges of walkways and driveways and fix any cracks. Remove anything that might make you trip as you walk through a door, such as a raised step or threshold. Trim any bushes or trees on the path to your home. Use bright outdoor lighting. Clear any walking paths of anything that might make someone trip, such as rocks or tools. Regularly check to see if handrails are loose or broken. Make sure that both sides of any  steps have handrails. Any raised decks and porches should have guardrails on the edges. Have any leaves, snow, or ice cleared regularly. Use sand or salt on walking paths during winter. Clean up any spills in your garage right away. This includes oil or grease spills. What can I do in the bathroom? Use night lights. Install grab bars by the toilet and in the tub and shower. Do not use towel bars as grab bars. Use non-skid mats or decals in the tub or shower. If you need to sit down in the shower, use a plastic, non-slip stool. Keep the floor dry. Clean up any water that spills on the floor as soon as it happens. Remove soap buildup in the tub or shower regularly. Attach bath mats securely with double-sided non-slip rug tape. Do not have throw rugs and other things on the floor that can make you trip. What can I do in the bedroom? Use night lights. Make sure that you have a light by your bed that is easy to reach. Do not use any sheets or blankets that are too big for your bed. They should not  hang down onto the floor. Have a firm chair that has side arms. You can use this for support while you get dressed. Do not have throw rugs and other things on the floor that can make you trip. What can I do in the kitchen? Clean up any spills right away. Avoid walking on wet floors. Keep items that you use a lot in easy-to-reach places. If you need to reach something above you, use a strong step stool that has a grab bar. Keep electrical cords out of the way. Do not use floor polish or wax that makes floors slippery. If you must use wax, use non-skid floor wax. Do not have throw rugs and other things on the floor that can make you trip. What can I do with my stairs? Do not leave any items on the stairs. Make sure that there are handrails on both sides of the stairs and use them. Fix handrails that are broken or loose. Make sure that handrails are as long as the stairways. Check any carpeting to  make sure that it is firmly attached to the stairs. Fix any carpet that is loose or worn. Avoid having throw rugs at the top or bottom of the stairs. If you do have throw rugs, attach them to the floor with carpet tape. Make sure that you have a light switch at the top of the stairs and the bottom of the stairs. If you do not have them, ask someone to add them for you. What else can I do to help prevent falls? Wear shoes that: Do not have high heels. Have rubber bottoms. Are comfortable and fit you well. Are closed at the toe. Do not wear sandals. If you use a stepladder: Make sure that it is fully opened. Do not climb a closed stepladder. Make sure that both sides of the stepladder are locked into place. Ask someone to hold it for you, if possible. Clearly mark and make sure that you can see: Any grab bars or handrails. First and last steps. Where the edge of each step is. Use tools that help you move around (mobility aids) if they are needed. These include: Canes. Walkers. Scooters. Crutches. Turn on the lights when you go into a dark area. Replace any light bulbs as soon as they burn out. Set up your furniture so you have a clear path. Avoid moving your furniture around. If any of your floors are uneven, fix them. If there are any pets around you, be aware of where they are. Review your medicines with your doctor. Some medicines can make you feel dizzy. This can increase your chance of falling. Ask your doctor what other things that you can do to help prevent falls. This information is not intended to replace advice given to you by your health care provider. Make sure you discuss any questions you have with your health care provider. Document Released: 03/31/2009 Document Revised: 11/10/2015 Document Reviewed: 07/09/2014 Elsevier Interactive Patient Education  2017 [employer].

## 2023-01-22 ENCOUNTER — Ambulatory Visit: Payer: Medicare PPO | Admitting: Internal Medicine

## 2023-01-29 ENCOUNTER — Other Ambulatory Visit (HOSPITAL_COMMUNITY): Payer: Self-pay

## 2023-01-29 NOTE — Telephone Encounter (Signed)
Pt ready for scheduling for Prolia on or after : 01/29/23  Out-of-pocket cost due at time of visit: $80  Primary: [employer] Prolia co-insurance: $40 Admin fee co-insurance: $40  Secondary: N/A Prolia co-insurance:  Admin fee co-insurance:   Medical Benefit Details: Date Benefits were checked: 01/04/23 Deductible: NO/ Coinsurance: $40/ Admin Fee: $40  Prior Auth: available without authorization PA# Expiration Date:   # of doses approved:  Pharmacy benefit: Copay $64 If patient wants fill through the pharmacy benefit please send prescription to:  [facility] , and include estimated need by date in rx notes. Pharmacy will ship medication directly to the office.  Patient not eligible for Prolia Copay Card. Copay Card can make patient's cost as little as $25. Link to apply: [email or web address]://www.amgensupportplus.[email or web address]/copay  ** This summary of benefits is an estimation of the patient's out-of-pocket cost. Exact cost may very based on individual plan coverage.

## 2023-01-29 NOTE — Telephone Encounter (Signed)
Called pt no answer LMOM RTC.../lmb 

## 2023-01-30 MED ORDER — DENOSUMAB 60 MG/ML ~~LOC~~ SOSY: 60.0000 mg | PREFILLED_SYRINGE | Freq: Once | SUBCUTANEOUS | 0 refills | Status: AC

## 2023-01-30 NOTE — Addendum Note (Signed)
Addended by: Deatra James on: 01/30/2023 11:24 AM   Modules accepted: Orders

## 2023-01-30 NOTE — Telephone Encounter (Signed)
Pt call back she states her phone went dead. Made Prolia appt for 03-04-23 @ 3:20 pm. Order med from [facility].Marland KitchenRaechel Chute

## 2023-01-30 NOTE — Telephone Encounter (Signed)
Tried calling pt she had answer but then lost the call. Tried calling pt back no answer. If pt call back pls give her coverage or the prolia and scheduled.Marland KitchenRaechel Chute

## 2023-02-11 ENCOUNTER — Telehealth: Payer: Self-pay | Admitting: Internal Medicine

## 2023-02-11 NOTE — Telephone Encounter (Signed)
Noted../lmb 

## 2023-02-11 NOTE — Telephone Encounter (Signed)
Prolia will be shipped her to [place] - Per Prolia [employer].  It will be delivered tomorrow

## 2023-02-12 NOTE — Telephone Encounter (Signed)
Pt Prolia has been delivered by her pharmacy.  Pt does have a Pharmacy benefit: Copay $64 that is due at day of visit for her Prolia Inject

## 2023-02-13 ENCOUNTER — Ambulatory Visit (INDEPENDENT_AMBULATORY_CARE_PROVIDER_SITE_OTHER): Payer: Medicare PPO

## 2023-02-13 DIAGNOSIS — M810 Age-related osteoporosis without current pathological fracture: Secondary | ICD-10-CM

## 2023-02-13 MED ORDER — DENOSUMAB 60 MG/ML ~~LOC~~ SOSY
60.0000 mg | PREFILLED_SYRINGE | Freq: Once | SUBCUTANEOUS | Status: AC
  Administered 2023-02-13: 60 mg via SUBCUTANEOUS

## 2023-02-13 NOTE — Progress Notes (Addendum)
Prolia given.  Pt tolerated well. Pt is aware to give the office a call for an side effects or reactions. Please co-sign.   Medical screening examination/treatment/procedure(s) were performed by non-physician practitioner and as supervising physician I was immediately available for consultation/collaboration.  I agree with above. Jacinta Shoe, MD

## 2023-02-28 ENCOUNTER — Encounter (HOSPITAL_BASED_OUTPATIENT_CLINIC_OR_DEPARTMENT_OTHER): Payer: Self-pay | Admitting: Pediatrics

## 2023-02-28 ENCOUNTER — Other Ambulatory Visit: Payer: Self-pay

## 2023-02-28 ENCOUNTER — Emergency Department (HOSPITAL_BASED_OUTPATIENT_CLINIC_OR_DEPARTMENT_OTHER)
Admission: EM | Admit: 2023-02-28 | Discharge: 2023-02-28 | Disposition: A | Discharge status: Home / Self Care | Payer: Medicare PPO | Attending: Emergency Medicine | Admitting: Emergency Medicine

## 2023-02-28 ENCOUNTER — Emergency Department (HOSPITAL_BASED_OUTPATIENT_CLINIC_OR_DEPARTMENT_OTHER): Payer: Medicare PPO

## 2023-02-28 DIAGNOSIS — M79661 Pain in right lower leg: Secondary | ICD-10-CM

## 2023-02-28 DIAGNOSIS — Z96651 Presence of right artificial knee joint: Secondary | ICD-10-CM | POA: Insufficient documentation

## 2023-02-28 DIAGNOSIS — Z85828 Personal history of other malignant neoplasm of skin: Secondary | ICD-10-CM | POA: Diagnosis not present

## 2023-02-28 DIAGNOSIS — I10 Essential (primary) hypertension: Secondary | ICD-10-CM | POA: Diagnosis not present

## 2023-02-28 DIAGNOSIS — Z7982 Long term (current) use of aspirin: Secondary | ICD-10-CM | POA: Diagnosis not present

## 2023-02-28 DIAGNOSIS — M7989 Other specified soft tissue disorders: Secondary | ICD-10-CM | POA: Diagnosis not present

## 2023-02-28 DIAGNOSIS — M79604 Pain in right leg: Secondary | ICD-10-CM | POA: Insufficient documentation

## 2023-02-28 DIAGNOSIS — Z79899 Other long term (current) drug therapy: Secondary | ICD-10-CM | POA: Insufficient documentation

## 2023-02-28 MED ORDER — KETOROLAC TROMETHAMINE 60 MG/2ML IM SOLN
30.0000 mg | Freq: Once | INTRAMUSCULAR | Status: AC
  Administered 2023-02-28: 30 mg via INTRAMUSCULAR
  Filled 2023-02-28: qty 2

## 2023-02-28 NOTE — ED Triage Notes (Signed)
C/O right leg pain and swelling with min relief from ibuprofen, denies any recent injury.

## 2023-02-28 NOTE — Discharge Instructions (Addendum)
Your ultrasound today did not show any evidence of blood clots.  However it did show an area of abnormality behind your right calf.  Follow-up with your primary care physician for an outpatient MRI for further evaluation. Please take tylenol/ibuprofen/naproxen for pain. Please do not hesitate to return to emergency department if worrisome signs symptoms we discussed become apparent.

## 2023-02-28 NOTE — ED Provider Notes (Signed)
[facility] Provider Note   CSN: [id number] Arrival date & time: 02/28/23  1542     History  Chief Complaint  Patient presents with   Leg Pain    [name] is a 77 y.o. female presents today for evaluation of leg pain.  Patient reports she has has increased pain and swelling on her right legs in the last 7 days.  Denies any personal history of PE/DVT.  Denies shortness of breath.  She also reports some bruises but high her right calf.  Denies any recent fall or leg injury.   Leg Pain   Past Medical History:  Diagnosis Date   Anemia    Anxiety    B12 DEFICIENCY    Blood transfusion without reported diagnosis    Cataract    Cholecystitis, unspecified    Chronic sinusitis    DEPRESSION    Diastolic dysfunction    DIVERTICULOSIS, COLON    DYSLIPIDEMIA    Dysrhythmia    PVC's   Fibromyalgia    GERD    GERD (gastroesophageal reflux disease)    Hyperplastic colon polyp    HYPERTENSION    INSOMNIA, PERSISTENT    Morbid obesity (HCC)    OSTEOARTHRITIS    Osteoporosis    PONV (postoperative nausea and vomiting)    Skin cancer 2018   upper lip    SYNCOPE    Ulcer of esophagus    VITAMIN D DEFICIENCY    Past Surgical History:  Procedure Laterality Date   ABDOMINAL HYSTERECTOMY     APPENDECTOMY     BREAST BIOPSY Left 2010   Stero biopsy   BREAST EXCISIONAL BIOPSY Left 1985   Benign    CESAREAN SECTION     CHOLECYSTECTOMY     COLONOSCOPY     GASTROPLASTY     gatric stapling     LAPAROSCOPY N/A 01/30/2017   Procedure: LAPAROSCOPY DIAGNOSTIC WITH LAPAROSCOPIC LYSIS OF ADHESIONS;  Surgeon: Glenna Fellows, MD;  Location: [facility];  Service: General;  Laterality: N/A;   MIDDLE EAR SURGERY Right    NASAL SINUS SURGERY Left 08/06/2018   Procedure: Endoscopic SPHENOIDECTOMY;  Surgeon: Serena Colonel, MD;  Location: [facility];  Service: ENT;  Laterality: Left;   revearsal of gastric stapling      TONSILLECTOMY     TOTAL KNEE ARTHROPLASTY     rt.   TOTAL KNEE REVISION Right 01/22/2022   Procedure: TOTAL KNEE REVISION;  Surgeon: Dannielle Huh, MD;  Location: [facility];  Service: Orthopedics;  Laterality: Right;   TUBAL LIGATION     TYMPANOPLASTY Right 10/22/2016   Procedure: RIGHT TYMPANOPLASTY;  Surgeon: Serena Colonel, MD;  Location: [facility];  Service: ENT;  Laterality: Right;   TYMPANOPLASTY Right 09/02/2017   Procedure: REVISION RIGHT TYMPANOPLASTY;  Surgeon: Serena Colonel, MD;  Location: [facility];  Service: ENT;  Laterality: Right;     Home Medications Prior to Admission medications   Medication Sig Start Date End Date Taking? Authorizing Provider  acetaminophen (TYLENOL) 500 MG tablet Take 2 tablets (1,000 mg total) by mouth every 6 (six) hours as needed for mild pain or moderate pain. 01/26/22   Guy Sandifer, PA  ALPRAZolam Prudy Feeler) 0.5 MG tablet Take 1 tablet (0.5 mg total) by mouth 2 (two) times daily as needed. 06/22/22   Plotnikov, Georgina Quint, MD  aspirin 81 MG chewable tablet Chew 1 tablet (81 mg total) by mouth 2 (two) times  daily. 01/26/22   Guy Sandifer, PA  buPROPion (WELLBUTRIN XL) 150 MG 24 hr tablet Take 1 tablet (150 mg total) by mouth daily. 06/22/22   Plotnikov, Georgina Quint, MD  Cholecalciferol (VITAMIN D) 2000 UNITS CAPS Take 2,000 Units by mouth daily.    [provider]  diclofenac Sodium (VOLTAREN) 1 % GEL Apply 1 Application topically 4 (four) times daily as needed (pain).    [provider]  fluticasone (FLONASE) 50 MCG/ACT nasal spray Place 2 sprays into both nostrils daily. Patient taking differently: Place 2 sprays into both nostrils daily as needed for allergies. 10/07/19   Plotnikov, Georgina Quint, MD  gabapentin (NEURONTIN) 100 MG capsule Take 1 capsule (100 mg total) by mouth at bedtime. 06/22/22   Plotnikov, Georgina Quint, MD  ibuprofen (ADVIL) 200 MG tablet Take 200 mg by mouth every 6 (six) hours as needed for  moderate pain.    [provider]  linaclotide (LINZESS) 290 MCG CAPS capsule Take 1 capsule (290 mcg total) by mouth daily. 03/08/22   Plotnikov, Georgina Quint, MD  lovastatin (MEVACOR) 40 MG tablet Take 1 tablet (40 mg total) by mouth at bedtime. 06/22/22   Plotnikov, Georgina Quint, MD  Melatonin 5 MG CAPS Take 5 mg by mouth at bedtime as needed (sleep).    [provider]  Multiple Vitamins-Minerals (PRESERVISION AREDS 2) CAPS Take 1 capsule by mouth daily.    [provider]  ondansetron (ZOFRAN) 4 MG tablet Take 1 tablet (4 mg total) by mouth every 8 (eight) hours as needed for nausea. 01/26/22   Guy Sandifer, PA  pantoprazole (PROTONIX) 40 MG tablet Take 1 tablet (40 mg total) by mouth daily. 10/23/22   Plotnikov, Georgina Quint, MD  solifenacin (VESICARE) 10 MG tablet TAKE 1 TABLET (10 MG TOTAL) BY MOUTH DAILY. ANNUAL APPT IS DUE MUST SEE PROVIDER FOR FUTURE REFILLS 06/12/22   Plotnikov, Georgina Quint, MD  traZODone (DESYREL) 50 MG tablet Take 50 mg by mouth as needed for sleep. 01/05/16   [provider]  verapamil (CALAN-SR) 240 MG CR tablet Take 1 tablet (240 mg total) by mouth at bedtime. 06/22/22   Plotnikov, Georgina Quint, MD  vitamin B-12 (CYANOCOBALAMIN) 500 MCG tablet Take 500 mcg by mouth daily.    [provider]      Allergies    Oxycodone, Levaquin [levofloxacin], Other, and Omnicef [cefdinir]    Review of Systems   Review of Systems Negative except as per HPI.  Physical Exam Updated Vital Signs BP (!) 156/91 (BP Location: Left Arm)   Pulse 77   Temp 97.9 F (36.6 C) (Oral)   Resp 16   Ht 5' 0.5" (1.537 m)   Wt 81.6 kg   SpO2 100%   BMI 34.58 kg/m  Physical Exam Vitals and nursing note reviewed.  Constitutional:      Appearance: Normal appearance.  HENT:     Head: Normocephalic and atraumatic.     Mouth/Throat:     Mouth: Mucous membranes are moist.  Eyes:     General: No scleral icterus. Cardiovascular:     Rate and Rhythm: Normal  rate and regular rhythm.     Pulses: Normal pulses.     Heart sounds: Normal heart sounds.  Pulmonary:     Effort: Pulmonary effort is normal.     Breath sounds: Normal breath sounds.  Abdominal:     General: Abdomen is flat.     Palpations: Abdomen is soft.  Tenderness: There is no abdominal tenderness.  Musculoskeletal:        General: No deformity.     Comments: Tenderness to palpation to the right calf with mild swelling and bruises.  Skin:    General: Skin is warm.     Findings: No rash.  Neurological:     General: No focal deficit present.     Mental Status: She is alert.  Psychiatric:        Mood and Affect: Mood normal.     ED Results / Procedures / Treatments   Labs (all labs ordered are listed, but only abnormal results are displayed) Labs Reviewed - No data to display  EKG None  Radiology US Venous Img Lower Unilateral Right  Result Date: 02/28/2023 CLINICAL DATA:  Pain and swelling for 1 day right lower extremity EXAM: Right LOWER EXTREMITY VENOUS DOPPLER ULTRASOUND TECHNIQUE: Gray-scale sonography with compression, as well as color and duplex ultrasound, were performed to evaluate the deep venous system(s) from the level of the common femoral vein through the popliteal and proximal calf veins. COMPARISON:  None Available. FINDINGS: VENOUS Normal compressibility of the common femoral, superficial femoral, and popliteal veins, as well as the visualized calf veins. Visualized portions of profunda femoral vein and great saphenous vein unremarkable. No filling defects to suggest DVT on grayscale or color Doppler imaging. Doppler waveforms show normal direction of venous flow, normal respiratory plasticity and response to augmentation. Limited views of the contralateral common femoral vein are unremarkable. OTHER Complex hypoechoic area identified along the right medial calf measuring 5.7 x 2.3 by 2.9 cm. There is no blood flow on color Doppler. Limitations: none  IMPRESSION: No evidence of right lower extremity DVT. Complex hypoechoic area along the medial right calf. This has has a broad differential including complex fluid collection, mass lesion. Please correlate for any known history. Recommend additional workup when clinically appropriate such as MRI, or if a known entity a follow up ultrasound to confirm clearance and exclude secondary underlying pathology. Electronically Signed   By: Karen Kays [name]   On: 02/28/2023 18:01    Procedures Procedures    Medications Ordered in ED Medications - No data to display  ED Course/ Medical Decision Making/ A&P                                 Medical Decision Making  This patient presents to the ED for left lower leg pain, this involves an extensive number of treatment options, and is a complaint that carries with a high risk of complications and morbidity.  The differential diagnosis includes DVT, cellulitis, fracture, dislocation.  This is not an exhaustive list.  Imaging studies: I ordered imaging studies, personally reviewed, interpreted imaging and agree with the radiologist's interpretations. The results include: Ultrasound of right leg showed no evidence of DVT.  However it did show hypoechogenic area measuring 6 x 3 x 2 cm along the right medial calf.  Problem list/ ED course/ Critical interventions/ Medical management: HPI: See above Vital signs within normal range and stable throughout visit. Laboratory/imaging studies significant for: See above. On physical examination, patient is afebrile and appears in no acute distress.  There was tenderness to palpation to the right calf with mild swelling and a small bruise.  Ultrasound of the right leg showed no evidence of DVT.  However it did show a hypoechogenic area measuring 6 x 3 x 2 cm along  the right medial calf.  Patient denies any shortness of breath, chest pain, nausea or vomiting.  I do not think there is any acute emergent causes of her leg  swelling and pain at this point.  Advised patient to follow-up with her primary care physician for an outpatient advance imaging study.  Strict ED return question discussed. I have reviewed the patient home medicines and have made adjustments as needed.  Cardiac monitoring/EKG: The patient was maintained on a cardiac monitor.  I personally reviewed and interpreted the cardiac monitor which showed an underlying rhythm of: sinus rhythm.  Additional history obtained: External records from outside source obtained and reviewed including: Chart review including previous notes, labs, imaging.  Consultations obtained:  Disposition Continued outpatient therapy. Follow-up with PCP recommended for reevaluation of symptoms. Treatment plan discussed with patient.  Pt acknowledged understanding was agreeable to the plan. Worrisome signs and symptoms were discussed with patient, and patient acknowledged understanding to return to the ED if they noticed these signs and symptoms. Patient was stable upon discharge.   This chart was dictated using voice recognition software.  Despite best efforts to proofread,  errors can occur which can change the documentation meaning.          Final Clinical Impression(s) / ED Diagnoses Final diagnoses:  Pain of right lower leg    Rx / DC Orders ED Discharge Orders     None         Jeanelle Malling, [place] 02/28/23 2012    Tanda Rockers A, DO 03/03/23 2353

## 2023-03-01 ENCOUNTER — Encounter: Payer: Self-pay | Admitting: Internal Medicine

## 2023-03-02 ENCOUNTER — Other Ambulatory Visit: Payer: Self-pay | Admitting: Internal Medicine

## 2023-03-12 ENCOUNTER — Other Ambulatory Visit: Payer: Self-pay | Admitting: Internal Medicine

## 2023-03-13 ENCOUNTER — Other Ambulatory Visit: Payer: Self-pay | Admitting: Internal Medicine

## 2023-03-13 MED ORDER — SOLIFENACIN SUCCINATE 10 MG PO TABS: 10.0000 mg | ORAL_TABLET | Freq: Every day | ORAL | 3 refills | Status: DC

## 2023-03-27 ENCOUNTER — Ambulatory Visit (INDEPENDENT_AMBULATORY_CARE_PROVIDER_SITE_OTHER): Payer: Medicare PPO | Admitting: Internal Medicine

## 2023-03-27 ENCOUNTER — Encounter: Payer: Self-pay | Admitting: Internal Medicine

## 2023-03-27 VITALS — BP 118/60 | HR 84 | Temp 98.3°F | Ht 60.5 in | Wt 181.0 lb

## 2023-03-27 DIAGNOSIS — M79604 Pain in right leg: Secondary | ICD-10-CM | POA: Diagnosis not present

## 2023-03-27 DIAGNOSIS — R609 Edema, unspecified: Secondary | ICD-10-CM

## 2023-03-27 DIAGNOSIS — E538 Deficiency of other specified B group vitamins: Secondary | ICD-10-CM

## 2023-03-27 DIAGNOSIS — R2241 Localized swelling, mass and lump, right lower limb: Secondary | ICD-10-CM

## 2023-03-27 LAB — URINALYSIS, ROUTINE W REFLEX MICROSCOPIC
Bilirubin Urine: NEGATIVE
Hgb urine dipstick: NEGATIVE
Ketones, ur: NEGATIVE
Nitrite: NEGATIVE
RBC / HPF: NONE SEEN (ref 0–?)
Specific Gravity, Urine: 1.02 (ref 1.000–1.030)
Total Protein, Urine: NEGATIVE
Urine Glucose: NEGATIVE
Urobilinogen, UA: 1 (ref 0.0–1.0)
pH: 6 (ref 5.0–8.0)

## 2023-03-27 LAB — COMPREHENSIVE METABOLIC PANEL
ALT: 7 U/L (ref 0–35)
AST: 10 U/L (ref 0–37)
Albumin: 4 g/dL (ref 3.5–5.2)
Alkaline Phosphatase: 67 U/L (ref 39–117)
BUN: 14 mg/dL (ref 6–23)
CO2: 27 meq/L (ref 19–32)
Calcium: 9 mg/dL (ref 8.4–10.5)
Chloride: 107 meq/L (ref 96–112)
Creatinine, Ser: 0.93 mg/dL (ref 0.40–1.20)
GFR: 59.32 mL/min — ABNORMAL LOW (ref >60.00)
Glucose, Bld: 98 mg/dL (ref 70–99)
Potassium: 4.9 meq/L (ref 3.5–5.1)
Sodium: 140 meq/L (ref 135–145)
Total Bilirubin: 0.7 mg/dL (ref 0.2–1.2)
Total Protein: 6.1 g/dL (ref 6.0–8.3)

## 2023-03-27 MED ORDER — TRAMADOL HCL 50 MG PO TABS: 50.0000 mg | ORAL_TABLET | Freq: Four times a day (QID) | ORAL | 0 refills | Status: DC | PRN

## 2023-03-27 MED ORDER — MELOXICAM 15 MG PO TABS: 15.0000 mg | ORAL_TABLET | Freq: Every day | ORAL | 0 refills | Status: DC

## 2023-03-27 NOTE — Progress Notes (Signed)
Subjective:  Patient ID: [name], female    DOB: April 09, 1946  Age: 77 y.o. MRN: [id number]  CC: Results (Pt wants to discuss findings in her [place] that was done in the ER)   HPI [name] presents for R calf pain x2 months and abn [place] lesion -  "Complex hypoechoic area identified along the right medial calf measuring 5.7 x 2.3 by 2.9 cm. There is no blood flow on color Doppler" on [place]. No DVT....  Today the pain is 5/10 now, worse w/standing or walking   Outpatient Medications Prior to Visit  Medication Sig Dispense Refill   acetaminophen (TYLENOL) 500 MG tablet Take 2 tablets (1,000 mg total) by mouth every 6 (six) hours as needed for mild pain or moderate pain. 30 tablet 0   ALPRAZolam (XANAX) 0.5 MG tablet Take 1 tablet (0.5 mg total) by mouth 2 (two) times daily as needed. 60 tablet 3   aspirin 81 MG chewable tablet Chew 1 tablet (81 mg total) by mouth 2 (two) times daily. 30 tablet 0   buPROPion (WELLBUTRIN XL) 150 MG 24 hr tablet TAKE 1 TABLET BY MOUTH EVERY DAY 90 tablet 1   Cholecalciferol (VITAMIN D) 2000 UNITS CAPS Take 2,000 Units by mouth daily.     diclofenac Sodium (VOLTAREN) 1 % GEL Apply 1 Application topically 4 (four) times daily as needed (pain).     fluticasone (FLONASE) 50 MCG/ACT nasal spray Place 2 sprays into both nostrils daily. (Patient taking differently: Place 2 sprays into both nostrils daily as needed for allergies.) 48 g 3   gabapentin (NEURONTIN) 100 MG capsule Take 1 capsule (100 mg total) by mouth at bedtime. 90 capsule 3   ibuprofen (ADVIL) 200 MG tablet Take 200 mg by mouth every 6 (six) hours as needed for moderate pain.     linaclotide (LINZESS) 290 MCG CAPS capsule Take 1 capsule (290 mcg total) by mouth daily. 30 capsule 11   lovastatin (MEVACOR) 40 MG tablet Take 1 tablet (40 mg total) by mouth at bedtime. 90 tablet 3   Melatonin 5 MG CAPS Take 5 mg by mouth at bedtime as needed (sleep).     Multiple Vitamins-Minerals (PRESERVISION AREDS 2)  CAPS Take 1 capsule by mouth daily.     ondansetron (ZOFRAN) 4 MG tablet Take 1 tablet (4 mg total) by mouth every 8 (eight) hours as needed for nausea. 20 tablet 0   pantoprazole (PROTONIX) 40 MG tablet Take 1 tablet (40 mg total) by mouth daily. 180 tablet 3   PROLIA 60 MG/ML SOSY injection Inject into the skin.     solifenacin (VESICARE) 10 MG tablet Take 1 tablet (10 mg total) by mouth daily. 90 tablet 3   traZODone (DESYREL) 50 MG tablet Take 50 mg by mouth as needed for sleep.     verapamil (CALAN-SR) 240 MG CR tablet Take 1 tablet (240 mg total) by mouth at bedtime. 90 tablet 3   vitamin B-12 (CYANOCOBALAMIN) 500 MCG tablet Take 500 mcg by mouth daily.     No facility-administered medications prior to visit.    ROS: Review of Systems  Constitutional:  Negative for activity change, appetite change, chills, fatigue and unexpected weight change.  HENT:  Negative for congestion, mouth sores and sinus pressure.   Eyes:  Negative for visual disturbance.  Respiratory:  Negative for cough and chest tightness.   Gastrointestinal:  Negative for abdominal pain and nausea.  Genitourinary:  Negative for difficulty urinating, frequency and vaginal  pain.  Musculoskeletal:  Positive for gait problem and myalgias. Negative for back pain.  Skin:  Negative for pallor and rash.  Neurological:  Negative for dizziness, tremors, weakness, numbness and headaches.  Psychiatric/Behavioral:  Negative for confusion and sleep disturbance.     Objective:  BP 118/60 (BP Location: Left Arm, Patient Position: Sitting, Cuff Size: Normal)   Pulse 84   Temp 98.3 F (36.8 C) (Oral)   Ht 5' 0.5" (1.537 m)   Wt 181 lb (82.1 kg)   SpO2 96%   BMI 34.77 kg/m   BP Readings from Last 3 Encounters:  03/27/23 118/60  02/28/23 (!) 155/78  12/07/22 (!) 151/93    Wt Readings from Last 3 Encounters:  03/27/23 181 lb (82.1 kg)  02/28/23 180 lb (81.6 kg)  12/26/22 169 lb (76.7 kg)    Physical  Exam Constitutional:      General: She is not in acute distress.    Appearance: She is well-developed. She is obese.  HENT:     Head: Normocephalic.     Right Ear: External ear normal.     Left Ear: External ear normal.     Nose: Nose normal.  Eyes:     General:        Right eye: No discharge.        Left eye: No discharge.     Conjunctiva/sclera: Conjunctivae normal.     Pupils: Pupils are equal, round, and reactive to light.  Neck:     Thyroid: No thyromegaly.     Vascular: No JVD.     Trachea: No tracheal deviation.  Cardiovascular:     Rate and Rhythm: Normal rate and regular rhythm.     Heart sounds: Normal heart sounds.  Pulmonary:     Effort: No respiratory distress.     Breath sounds: No stridor. No wheezing.  Abdominal:     General: Bowel sounds are normal. There is no distension.     Palpations: Abdomen is soft. There is no mass.     Tenderness: There is no abdominal tenderness. There is no guarding or rebound.  Musculoskeletal:        General: No tenderness.     Cervical back: Normal range of motion and neck supple. No rigidity.  Lymphadenopathy:     Cervical: No cervical adenopathy.  Skin:    Findings: No erythema or rash.  Neurological:     Cranial Nerves: No cranial nerve deficit.     Motor: No abnormal muscle tone.     Coordination: Coordination normal.     Deep Tendon Reflexes: Reflexes normal.  Psychiatric:        Behavior: Behavior normal.        Thought Content: Thought content normal.        Judgment: Judgment normal.    R posterior calf w/pain, no mass  Lab Results  Component Value Date   WBC 7.1 01/08/2022   HGB 11.8 (L) 01/08/2022   HCT 37.1 01/08/2022   PLT 257 01/08/2022   GLUCOSE 90 01/08/2022   CHOL 141 08/31/2021   TRIG 54 08/31/2021   HDL 48 08/31/2021   LDLDIRECT 111.0 02/04/2019   LDLCALC 82 08/31/2021   ALT 10 01/08/2022   AST 10 (L) 01/08/2022   NA 138 01/08/2022   K 3.9 01/08/2022   CL 108 01/08/2022   CREATININE 0.76  01/08/2022   BUN 11 01/08/2022   CO2 25 01/08/2022   TSH 0.504 08/31/2021   INR 1.1 08/31/2021  HGBA1C 5.6 08/31/2021    US Venous Img Lower Unilateral Right  Result Date: 02/28/2023 CLINICAL DATA:  Pain and swelling for 1 day right lower extremity EXAM: Right LOWER EXTREMITY VENOUS DOPPLER ULTRASOUND TECHNIQUE: Gray-scale sonography with compression, as well as color and duplex ultrasound, were performed to evaluate the deep venous system(s) from the level of the common femoral vein through the popliteal and proximal calf veins. COMPARISON:  None Available. FINDINGS: VENOUS Normal compressibility of the common femoral, superficial femoral, and popliteal veins, as well as the visualized calf veins. Visualized portions of profunda femoral vein and great saphenous vein unremarkable. No filling defects to suggest DVT on grayscale or color Doppler imaging. Doppler waveforms show normal direction of venous flow, normal respiratory plasticity and response to augmentation. Limited views of the contralateral common femoral vein are unremarkable. OTHER Complex hypoechoic area identified along the right medial calf measuring 5.7 x 2.3 by 2.9 cm. There is no blood flow on color Doppler. Limitations: none IMPRESSION: No evidence of right lower extremity DVT. Complex hypoechoic area along the medial right calf. This has has a broad differential including complex fluid collection, mass lesion. Please correlate for any known history. Recommend additional workup when clinically appropriate such as MRI, or if a known entity a follow up ultrasound to confirm clearance and exclude secondary underlying pathology. Electronically Signed   By: Karen Kays [name]   On: 02/28/2023 18:01    Assessment & Plan:   Problem List Items Addressed This Visit     B12 deficiency    On B12      Relevant Orders   Vitamin B12   Lower leg mass, right - Primary    New R calf pain x2 months and abn [place] lesion -  "Complex hypoechoic  area identified along the right medial calf measuring 5.7 x 2.3 by 2.9 cm. There is no blood flow on color Doppler" on [place]. No DVT.... Will get an MRI, labs      Relevant Orders   MR TIBIA FIBULA RIGHT W WO CONTRAST   Comprehensive metabolic panel   CBC with Differential/Platelet   TSH   Vitamin B12   Urinalysis   Sedimentation rate   Leg pain, posterior, right    R calf pain x2 months and abn [place] lesion -  "Complex hypoechoic area identified along the right medial calf measuring 5.7 x 2.3 by 2.9 cm. There is no blood flow on color Doppler" on [place]. No DVT....  Today the pain is 5/10 now, worse w/standing or walking  Will use Tramadol prn  Potential benefits of a short/long term opioids use as well as potential risks (i.e. addiction risk, apnea etc) and complications (i.e. Somnolence, constipation and others) were explained to the patient and were aknowledged. Meloxicam po      Relevant Orders   MR TIBIA FIBULA RIGHT W WO CONTRAST   Comprehensive metabolic panel   CBC with Differential/Platelet   TSH   Vitamin B12   Urinalysis   Sedimentation rate   Other Visit Diagnoses     Edema, unspecified type       Relevant Orders   CBC with Differential/Platelet   TSH   Sedimentation rate         Meds ordered this encounter  Medications   meloxicam (MOBIC) 15 MG tablet    Sig: Take 1 tablet (15 mg total) by mouth daily.    Dispense:  30 tablet    Refill:  0   traMADol (ULTRAM) 50  MG tablet    Sig: Take 1 tablet (50 mg total) by mouth every 6 (six) hours as needed for severe pain.    Dispense:  20 tablet    Refill:  0      Follow-up: Return in about 4 weeks (around 04/24/2023) for a follow-up visit.  Sonda Primes, MD

## 2023-03-27 NOTE — Assessment & Plan Note (Signed)
On B12 

## 2023-03-27 NOTE — Assessment & Plan Note (Addendum)
R calf pain x2 months and abn [place] lesion -  "Complex hypoechoic area identified along the right medial calf measuring 5.7 x 2.3 by 2.9 cm. There is no blood flow on color Doppler" on [place]. No DVT....  Today the pain is 5/10 now, worse w/standing or walking  Will use Tramadol prn  Potential benefits of a short/long term opioids use as well as potential risks (i.e. addiction risk, apnea etc) and complications (i.e. Somnolence, constipation and others) were explained to the patient and were aknowledged. Meloxicam po

## 2023-03-27 NOTE — Assessment & Plan Note (Signed)
New R calf pain x2 months and abn [place] lesion -  "Complex hypoechoic area identified along the right medial calf measuring 5.7 x 2.3 by 2.9 cm. There is no blood flow on color Doppler" on [place]. No DVT.... Will get an MRI, labs

## 2023-03-28 LAB — CBC WITH DIFFERENTIAL/PLATELET
Basophils Absolute: 0 10*3/uL (ref 0.0–0.1)
Basophils Relative: 0.3 % (ref 0.0–3.0)
Eosinophils Absolute: 0.2 10*3/uL (ref 0.0–0.7)
Eosinophils Relative: 2.6 % (ref 0.0–5.0)
HCT: 35.8 % — ABNORMAL LOW (ref 36.0–46.0)
Hemoglobin: 11.6 g/dL — ABNORMAL LOW (ref 12.0–15.0)
Lymphocytes Relative: 37 % (ref 12.0–46.0)
Lymphs Abs: 2.5 10*3/uL (ref 0.7–4.0)
MCHC: 32.3 g/dL (ref 30.0–36.0)
MCV: 90.9 fL (ref 78.0–100.0)
Monocytes Absolute: 0.5 10*3/uL (ref 0.1–1.0)
Monocytes Relative: 7.5 % (ref 3.0–12.0)
Neutro Abs: 3.5 10*3/uL (ref 1.4–7.7)
Neutrophils Relative %: 52.6 % (ref 43.0–77.0)
Platelets: 237 10*3/uL (ref 150.0–400.0)
RBC: 3.94 Mil/uL (ref 3.87–5.11)
RDW: 15.2 % (ref 11.5–15.5)
WBC: 6.6 10*3/uL (ref 4.0–10.5)

## 2023-03-28 LAB — VITAMIN B12: Vitamin B-12: 346 pg/mL (ref 211–911)

## 2023-03-28 LAB — TSH: TSH: 0.98 u[IU]/mL (ref 0.35–5.50)

## 2023-03-28 LAB — SEDIMENTATION RATE: Sed Rate: 8 mm/h (ref 0–30)

## 2023-04-01 ENCOUNTER — Encounter: Payer: Self-pay | Admitting: Internal Medicine

## 2023-04-07 ENCOUNTER — Ambulatory Visit
Admission: RE | Admit: 2023-04-07 | Discharge: 2023-04-07 | Disposition: A | Discharge status: Home / Self Care | Payer: Medicare PPO | Source: Ambulatory Visit | Attending: Internal Medicine | Admitting: Internal Medicine

## 2023-04-07 DIAGNOSIS — M79604 Pain in right leg: Secondary | ICD-10-CM

## 2023-04-07 DIAGNOSIS — R2241 Localized swelling, mass and lump, right lower limb: Secondary | ICD-10-CM

## 2023-04-07 MED ORDER — GADOPICLENOL 0.5 MMOL/ML IV SOLN
8.0000 mL | Freq: Once | INTRAVENOUS | Status: AC | PRN
  Administered 2023-04-07: 8 mL via INTRAVENOUS

## 2023-04-24 ENCOUNTER — Other Ambulatory Visit: Payer: Self-pay | Admitting: Internal Medicine

## 2023-04-30 ENCOUNTER — Ambulatory Visit: Payer: Medicare PPO | Admitting: Internal Medicine

## 2023-04-30 VITALS — BP 110/80 | HR 83 | Temp 98.2°F | Ht 60.56 in | Wt 180.0 lb

## 2023-04-30 DIAGNOSIS — I10 Essential (primary) hypertension: Secondary | ICD-10-CM | POA: Diagnosis not present

## 2023-04-30 DIAGNOSIS — R2241 Localized swelling, mass and lump, right lower limb: Secondary | ICD-10-CM | POA: Diagnosis not present

## 2023-04-30 NOTE — Assessment & Plan Note (Addendum)
MRI is done on 04/07/23 but not read yet Pain is better Will contact Bonita Quin when we get the report

## 2023-04-30 NOTE — Assessment & Plan Note (Signed)
 Continue on verapamil

## 2023-04-30 NOTE — Progress Notes (Signed)
Subjective:  Patient ID: [name], female    DOB: [birth year]  Age: 77 y.o. MRN: [id number]  CC: Medical Management of Chronic Issues (6 MNTH F/U)   HPI [name] presents for the R leg mass and pain - better. Pain is 2-3/10 MRI was done  Outpatient Medications Prior to Visit  Medication Sig Dispense Refill   acetaminophen (TYLENOL) 500 MG tablet Take 2 tablets (1,000 mg total) by mouth every 6 (six) hours as needed for mild pain or moderate pain. 30 tablet 0   ALPRAZolam (XANAX) 0.5 MG tablet Take 1 tablet (0.5 mg total) by mouth 2 (two) times daily as needed. 60 tablet 3   aspirin 81 MG chewable tablet Chew 1 tablet (81 mg total) by mouth 2 (two) times daily. 30 tablet 0   buPROPion (WELLBUTRIN XL) 150 MG 24 hr tablet TAKE 1 TABLET BY MOUTH EVERY DAY 90 tablet 1   Cholecalciferol (VITAMIN D) 2000 UNITS CAPS Take 2,000 Units by mouth daily.     diclofenac Sodium (VOLTAREN) 1 % GEL Apply 1 Application topically 4 (four) times daily as needed (pain).     fluticasone (FLONASE) 50 MCG/ACT nasal spray Place 2 sprays into both nostrils daily. (Patient taking differently: Place 2 sprays into both nostrils daily as needed for allergies.) 48 g 3   gabapentin (NEURONTIN) 100 MG capsule Take 1 capsule (100 mg total) by mouth at bedtime. 90 capsule 3   ibuprofen (ADVIL) 200 MG tablet Take 200 mg by mouth every 6 (six) hours as needed for moderate pain.     linaclotide (LINZESS) 290 MCG CAPS capsule Take 1 capsule (290 mcg total) by mouth daily. 30 capsule 11   lovastatin (MEVACOR) 40 MG tablet Take 1 tablet (40 mg total) by mouth at bedtime. 90 tablet 3   Melatonin 5 MG CAPS Take 5 mg by mouth at bedtime as needed (sleep).     meloxicam (MOBIC) 15 MG tablet TAKE 1 TABLET BY MOUTH EVERY DAY 30 tablet 1   Multiple Vitamins-Minerals (PRESERVISION AREDS 2) CAPS Take 1 capsule by mouth daily.     ondansetron (ZOFRAN) 4 MG tablet Take 1 tablet (4 mg total) by mouth every 8 (eight) hours as needed  for nausea. 20 tablet 0   pantoprazole (PROTONIX) 40 MG tablet Take 1 tablet (40 mg total) by mouth daily. 180 tablet 3   PROLIA 60 MG/ML SOSY injection Inject into the skin.     solifenacin (VESICARE) 10 MG tablet Take 1 tablet (10 mg total) by mouth daily. 90 tablet 3   traMADol (ULTRAM) 50 MG tablet Take 1 tablet (50 mg total) by mouth every 6 (six) hours as needed for severe pain. 20 tablet 0   traZODone (DESYREL) 50 MG tablet Take 50 mg by mouth as needed for sleep.     verapamil (CALAN-SR) 240 MG CR tablet Take 1 tablet (240 mg total) by mouth at bedtime. 90 tablet 3   vitamin B-12 (CYANOCOBALAMIN) 500 MCG tablet Take 500 mcg by mouth daily.     No facility-administered medications prior to visit.    ROS: Review of Systems  Constitutional:  Negative for activity change, appetite change, chills, fatigue and unexpected weight change.  HENT:  Negative for congestion, mouth sores and sinus pressure.   Eyes:  Negative for visual disturbance.  Respiratory:  Negative for cough and chest tightness.   Gastrointestinal:  Negative for abdominal pain and nausea.  Genitourinary:  Negative for difficulty urinating, frequency and vaginal  pain.  Musculoskeletal:  Negative for back pain and gait problem.  Skin:  Negative for pallor and rash.  Neurological:  Negative for dizziness, tremors, weakness, numbness and headaches.  Psychiatric/Behavioral:  Negative for confusion and sleep disturbance.     Objective:  BP 110/80 (BP Location: Left Arm, Patient Position: Sitting, Cuff Size: Normal)   Pulse 83   Temp 98.2 F (36.8 C) (Oral)   Ht 5' 0.56" (1.538 m)   Wt 180 lb (81.6 kg)   SpO2 94%   BMI 34.51 kg/m   BP Readings from Last 3 Encounters:  04/30/23 110/80  03/27/23 118/60  02/28/23 (!) 155/78    Wt Readings from Last 3 Encounters:  04/30/23 180 lb (81.6 kg)  03/27/23 181 lb (82.1 kg)  02/28/23 180 lb (81.6 kg)    Physical Exam Constitutional:      General: She is not in acute  distress.    Appearance: She is well-developed. She is obese.  HENT:     Head: Normocephalic.     Right Ear: External ear normal.     Left Ear: External ear normal.     Nose: Nose normal.  Eyes:     General:        Right eye: No discharge.        Left eye: No discharge.     Conjunctiva/sclera: Conjunctivae normal.     Pupils: Pupils are equal, round, and reactive to light.  Neck:     Thyroid: No thyromegaly.     Vascular: No JVD.     Trachea: No tracheal deviation.  Cardiovascular:     Rate and Rhythm: Normal rate and regular rhythm.     Heart sounds: Normal heart sounds.  Pulmonary:     Effort: No respiratory distress.     Breath sounds: No stridor. No wheezing.  Abdominal:     General: Bowel sounds are normal. There is no distension.     Palpations: Abdomen is soft. There is no mass.     Tenderness: There is no abdominal tenderness. There is no guarding or rebound.  Musculoskeletal:        General: No tenderness.     Cervical back: Normal range of motion and neck supple. No rigidity.  Lymphadenopathy:     Cervical: No cervical adenopathy.  Skin:    Findings: No erythema or rash.  Neurological:     Cranial Nerves: No cranial nerve deficit.     Motor: No abnormal muscle tone.     Coordination: Coordination normal.     Deep Tendon Reflexes: Reflexes normal.  Psychiatric:        Behavior: Behavior normal.        Thought Content: Thought content normal.        Judgment: Judgment normal.   RLE exam is unchanged, mass of the same size  Lab Results  Component Value Date   WBC 6.6 03/27/2023   HGB 11.6 (L) 03/27/2023   HCT 35.8 (L) 03/27/2023   PLT 237.0 03/27/2023   GLUCOSE 98 03/27/2023   CHOL 141 08/31/2021   TRIG 54 08/31/2021   HDL 48 08/31/2021   LDLDIRECT 111.0 02/04/2019   LDLCALC 82 08/31/2021   ALT 7 03/27/2023   AST 10 03/27/2023   NA 140 03/27/2023   K 4.9 03/27/2023   CL 107 03/27/2023   CREATININE 0.93 03/27/2023   BUN 14 03/27/2023   CO2 27  03/27/2023   TSH 0.98 03/27/2023   INR 1.1 08/31/2021   HGBA1C  5.6 08/31/2021    No results found.  Assessment & Plan:   Problem List Items Addressed This Visit     Lower leg mass, right - Primary    MRI is done on 04/07/23 but not read yet Pain is better Will contact Bonita Quin when we get the report          No orders of the defined types were placed in this encounter.     Follow-up: No follow-ups on file.  Sonda Primes, MD

## 2023-05-15 DIAGNOSIS — Z961 Presence of intraocular lens: Secondary | ICD-10-CM | POA: Diagnosis not present

## 2023-05-15 DIAGNOSIS — H43813 Vitreous degeneration, bilateral: Secondary | ICD-10-CM | POA: Diagnosis not present

## 2023-05-15 DIAGNOSIS — H35363 Drusen (degenerative) of macula, bilateral: Secondary | ICD-10-CM | POA: Diagnosis not present

## 2023-05-15 DIAGNOSIS — H04123 Dry eye syndrome of bilateral lacrimal glands: Secondary | ICD-10-CM | POA: Diagnosis not present

## 2023-06-25 ENCOUNTER — Other Ambulatory Visit: Payer: Self-pay | Admitting: Internal Medicine

## 2023-07-24 ENCOUNTER — Other Ambulatory Visit: Payer: Self-pay | Admitting: Internal Medicine

## 2023-08-07 ENCOUNTER — Other Ambulatory Visit: Payer: Self-pay | Admitting: Gynecology

## 2023-08-07 DIAGNOSIS — Z0000 Encounter for general adult medical examination without abnormal findings: Secondary | ICD-10-CM

## 2023-08-22 ENCOUNTER — Ambulatory Visit
Admission: RE | Admit: 2023-08-22 | Discharge: 2023-08-22 | Disposition: A | Discharge status: Home / Self Care | Payer: Medicare PPO | Source: Ambulatory Visit | Attending: Internal Medicine | Admitting: Internal Medicine

## 2023-08-22 DIAGNOSIS — Z0000 Encounter for general adult medical examination without abnormal findings: Secondary | ICD-10-CM

## 2023-08-22 DIAGNOSIS — Z1231 Encounter for screening mammogram for malignant neoplasm of breast: Secondary | ICD-10-CM | POA: Diagnosis not present

## 2023-09-12 ENCOUNTER — Other Ambulatory Visit: Payer: Self-pay | Admitting: Internal Medicine

## 2023-10-28 ENCOUNTER — Other Ambulatory Visit: Payer: Self-pay | Admitting: Internal Medicine

## 2023-10-29 ENCOUNTER — Other Ambulatory Visit: Payer: Self-pay | Admitting: Internal Medicine

## 2023-10-29 ENCOUNTER — Ambulatory Visit: Payer: Medicare PPO | Admitting: Internal Medicine

## 2023-11-05 ENCOUNTER — Ambulatory Visit (INDEPENDENT_AMBULATORY_CARE_PROVIDER_SITE_OTHER)

## 2023-11-05 ENCOUNTER — Ambulatory Visit: Admitting: Internal Medicine

## 2023-11-05 ENCOUNTER — Encounter: Payer: Self-pay | Admitting: Internal Medicine

## 2023-11-05 VITALS — BP 132/78 | HR 84 | Temp 95.0°F | Ht 60.56 in | Wt 179.4 lb

## 2023-11-05 DIAGNOSIS — I2510 Atherosclerotic heart disease of native coronary artery without angina pectoris: Secondary | ICD-10-CM | POA: Diagnosis not present

## 2023-11-05 DIAGNOSIS — R052 Subacute cough: Secondary | ICD-10-CM

## 2023-11-05 DIAGNOSIS — I10 Essential (primary) hypertension: Secondary | ICD-10-CM

## 2023-11-05 DIAGNOSIS — E785 Hyperlipidemia, unspecified: Secondary | ICD-10-CM

## 2023-11-05 DIAGNOSIS — E538 Deficiency of other specified B group vitamins: Secondary | ICD-10-CM

## 2023-11-05 DIAGNOSIS — I2583 Coronary atherosclerosis due to lipid rich plaque: Secondary | ICD-10-CM | POA: Diagnosis not present

## 2023-11-05 DIAGNOSIS — R059 Cough, unspecified: Secondary | ICD-10-CM | POA: Diagnosis not present

## 2023-11-05 MED ORDER — ALPRAZOLAM 0.5 MG PO TABS
0.5000 mg | ORAL_TABLET | Freq: Two times a day (BID) | ORAL | 3 refills | Status: AC | PRN
Start: 2023-11-05 — End: ?

## 2023-11-05 MED ORDER — VERAPAMIL HCL ER 240 MG PO TBCR: 240.0000 mg | EXTENDED_RELEASE_TABLET | Freq: Every day | ORAL | 3 refills | Status: DC

## 2023-11-05 MED ORDER — TRAMADOL HCL 50 MG PO TABS: 50.0000 mg | ORAL_TABLET | Freq: Four times a day (QID) | ORAL | 1 refills | Status: AC | PRN

## 2023-11-05 MED ORDER — LOVASTATIN 40 MG PO TABS: 40.0000 mg | ORAL_TABLET | Freq: Every day | ORAL | 3 refills | Status: AC

## 2023-11-05 NOTE — Assessment & Plan Note (Signed)
 On B12

## 2023-11-05 NOTE — Progress Notes (Signed)
 Subjective:  Patient ID: [name], female    DOB: 1945/08/27  Age: 78 y.o. MRN: [id number]  CC: Medical Management of Chronic Issues (Follow up with PCP and labs. Patient also still having ongoing productive cough for the past 2 months with throat sensitivity. Currently treating with lozenges and peppermint candy.)   HPI [name] presents for dry cough x 2 months  Outpatient Medications Prior to Visit  Medication Sig Dispense Refill   acetaminophen  (TYLENOL ) 500 MG tablet Take 2 tablets (1,000 mg total) by mouth every 6 (six) hours as needed for mild pain or moderate pain. 30 tablet 0   aspirin  81 MG chewable tablet Chew 1 tablet (81 mg total) by mouth 2 (two) times daily. 30 tablet 0   buPROPion  (WELLBUTRIN  XL) 150 MG 24 hr tablet TAKE 1 TABLET BY MOUTH EVERY DAY 90 tablet 1   Cholecalciferol  (VITAMIN D ) 2000 UNITS CAPS Take 2,000 Units by mouth daily.     diclofenac  Sodium (VOLTAREN ) 1 % GEL Apply 1 Application topically 4 (four) times daily as needed (pain).     fluticasone  (FLONASE ) 50 MCG/ACT nasal spray Place 2 sprays into both nostrils daily. (Patient taking differently: Place 2 sprays into both nostrils daily as needed for allergies.) 48 g 3   gabapentin  (NEURONTIN ) 100 MG capsule Take 1 capsule (100 mg total) by mouth at bedtime. 90 capsule 3   ibuprofen  (ADVIL ) 200 MG tablet Take 200 mg by mouth every 6 (six) hours as needed for moderate pain.     linaclotide  (LINZESS ) 290 MCG CAPS capsule Take 1 capsule (290 mcg total) by mouth daily. 30 capsule 11   Melatonin 5 MG CAPS Take 5 mg by mouth at bedtime as needed (sleep).     meloxicam  (MOBIC ) 15 MG tablet TAKE 1 TABLET BY MOUTH EVERY DAY 30 tablet 1   Multiple Vitamins-Minerals (PRESERVISION AREDS 2) CAPS Take 1 capsule by mouth daily.     ondansetron  (ZOFRAN ) 4 MG tablet Take 1 tablet (4 mg total) by mouth every 8 (eight) hours as needed for nausea. 20 tablet 0   pantoprazole  (PROTONIX ) 40 MG tablet TAKE 1 TABLET BY MOUTH  EVERY DAY 90 tablet 2   PROLIA  60 MG/ML SOSY injection Inject into the skin.     solifenacin  (VESICARE ) 10 MG tablet Take 1 tablet (10 mg total) by mouth daily. 90 tablet 3   traZODone  (DESYREL ) 50 MG tablet Take 50 mg by mouth as needed for sleep.     vitamin B-12 (CYANOCOBALAMIN ) 500 MCG tablet Take 500 mcg by mouth daily.     ALPRAZolam  (XANAX ) 0.5 MG tablet Take 1 tablet (0.5 mg total) by mouth 2 (two) times daily as needed. 60 tablet 3   lovastatin  (MEVACOR ) 40 MG tablet TAKE 1 TABLET BY MOUTH EVERYDAY AT BEDTIME 90 tablet 3   traMADol  (ULTRAM ) 50 MG tablet Take 1 tablet (50 mg total) by mouth every 6 (six) hours as needed for severe pain. 20 tablet 0   verapamil  (CALAN -SR) 240 MG CR tablet TAKE 1 TABLET BY MOUTH AT BEDTIME. 90 tablet 3   No facility-administered medications prior to visit.    ROS: Review of Systems  Constitutional:  Negative for activity change, appetite change, chills, fatigue and unexpected weight change.  HENT:  Negative for congestion, mouth sores and sinus pressure.   Eyes:  Negative for visual disturbance.  Respiratory:  Positive for cough. Negative for chest tightness and wheezing.   Cardiovascular:  Negative for leg swelling.  Gastrointestinal:  Negative for abdominal pain and nausea.  Genitourinary:  Negative for difficulty urinating, frequency and vaginal pain.  Musculoskeletal:  Positive for arthralgias, back pain and gait problem.  Skin:  Negative for pallor and rash.  Neurological:  Negative for dizziness, tremors, weakness, numbness and headaches.  Psychiatric/Behavioral:  Negative for confusion and sleep disturbance.     Objective:  BP 132/78   Pulse 84   Temp (!) 95 F (35 C)   Ht 5' 0.56 (1.538 m)   Wt 179 lb 6.4 oz (81.4 kg)   SpO2 95%   BMI 34.39 kg/m   BP Readings from Last 3 Encounters:  11/05/23 132/78  04/30/23 110/80  03/27/23 118/60    Wt Readings from Last 3 Encounters:  11/05/23 179 lb 6.4 oz (81.4 kg)  04/30/23 180 lb  (81.6 kg)  03/27/23 181 lb (82.1 kg)    Physical Exam Constitutional:      General: She is not in acute distress.    Appearance: She is well-developed. She is obese.  HENT:     Head: Normocephalic.     Right Ear: External ear normal.     Left Ear: External ear normal.     Nose: Nose normal.   Eyes:     General:        Right eye: No discharge.        Left eye: No discharge.     Conjunctiva/sclera: Conjunctivae normal.     Pupils: Pupils are equal, round, and reactive to light.   Neck:     Thyroid : No thyromegaly.     Vascular: No JVD.     Trachea: No tracheal deviation.   Cardiovascular:     Rate and Rhythm: Normal rate and regular rhythm.     Heart sounds: Normal heart sounds.  Pulmonary:     Effort: No respiratory distress.     Breath sounds: No stridor. No wheezing.  Abdominal:     General: Bowel sounds are normal. There is no distension.     Palpations: Abdomen is soft. There is no mass.     Tenderness: There is no abdominal tenderness. There is no guarding or rebound.   Musculoskeletal:        General: No tenderness.     Cervical back: Normal range of motion and neck supple. No rigidity.     Right lower leg: No edema.     Left lower leg: No edema.  Lymphadenopathy:     Cervical: No cervical adenopathy.   Skin:    Findings: No erythema or rash.   Neurological:     Mental Status: Mental status is at baseline.     Cranial Nerves: No cranial nerve deficit.     Motor: No abnormal muscle tone.     Coordination: Coordination normal.     Gait: Gait abnormal.     Deep Tendon Reflexes: Reflexes normal.   Psychiatric:        Behavior: Behavior normal.        Thought Content: Thought content normal.        Judgment: Judgment normal.    Nose lesion   Lab Results  Component Value Date   WBC 7.6 11/05/2023   HGB 12.4 11/05/2023   HCT 38.0 11/05/2023   PLT 237.0 11/05/2023   GLUCOSE 87 11/05/2023   CHOL 185 11/05/2023   TRIG 133.0 11/05/2023   HDL 46.10  11/05/2023   LDLDIRECT 111.0 02/04/2019   LDLCALC 113 (H) 11/05/2023   ALT 6  11/05/2023   AST 10 11/05/2023   NA 139 11/05/2023   K 4.1 11/05/2023   CL 104 11/05/2023   CREATININE 0.83 11/05/2023   BUN 9 11/05/2023   CO2 27 11/05/2023   TSH 1.41 11/05/2023   INR 1.1 08/31/2021   HGBA1C 5.6 08/31/2021    MM 3D SCREENING MAMMOGRAM BILATERAL BREAST Result Date: 08/28/2023 CLINICAL DATA:  Screening. EXAM: DIGITAL SCREENING BILATERAL MAMMOGRAM WITH TOMOSYNTHESIS AND CAD TECHNIQUE: Bilateral screening digital craniocaudal and mediolateral oblique mammograms were obtained. Bilateral screening digital breast tomosynthesis was performed. The images were evaluated with computer-aided detection. COMPARISON:  Previous exam(s). ACR Breast Density Category b: There are scattered areas of fibroglandular density. FINDINGS: There are no findings suspicious for malignancy. IMPRESSION: No mammographic evidence of malignancy. A result letter of this screening mammogram will be mailed directly to the patient. RECOMMENDATION: Screening mammogram in one year. (Code:SM-B-01Y) BI-RADS CATEGORY  1: Negative. Electronically Signed   By: Amanda Jungling [name]   On: 08/28/2023 10:38    Assessment & Plan:   Problem List Items Addressed This Visit     B12 deficiency   On B12      Relevant Orders   Vitamin B12 (Completed)   Dyslipidemia   Continue lovastatin       Relevant Medications   lovastatin  (MEVACOR ) 40 MG tablet   Other Relevant Orders   CBC with Differential/Platelet (Completed)   Comprehensive metabolic panel with GFR (Completed)   Lipid panel (Completed)   TSH (Completed)   Essential hypertension   Continue on verapamil       Relevant Medications   verapamil  (CALAN -SR) 240 MG CR tablet   lovastatin  (MEVACOR ) 40 MG tablet   Other Relevant Orders   CBC with Differential/Platelet (Completed)   Urinalysis (Completed)   Cough - Primary   Obtain chest x-ray And tramadol  as needed call       Relevant Orders   DG Chest 2 View (Completed)   CBC with Differential/Platelet (Completed)   Coronary atherosclerosis   Lovastatin   ASA      Relevant Medications   verapamil  (CALAN -SR) 240 MG CR tablet   lovastatin  (MEVACOR ) 40 MG tablet   Other Relevant Orders   Ambulatory referral to Cardiology   CBC with Differential/Platelet (Completed)   Comprehensive metabolic panel with GFR (Completed)   Lipid panel (Completed)   TSH (Completed)   Urinalysis (Completed)   Vitamin B12 (Completed)      Meds ordered this encounter  Medications   traMADol  (ULTRAM ) 50 MG tablet    Sig: Take 1 tablet (50 mg total) by mouth every 6 (six) hours as needed for severe pain (pain score 7-10).    Dispense:  20 tablet    Refill:  1   ALPRAZolam  (XANAX ) 0.5 MG tablet    Sig: Take 1 tablet (0.5 mg total) by mouth 2 (two) times daily as needed.    Dispense:  60 tablet    Refill:  3   verapamil  (CALAN -SR) 240 MG CR tablet    Sig: Take 1 tablet (240 mg total) by mouth at bedtime.    Dispense:  90 tablet    Refill:  3   lovastatin  (MEVACOR ) 40 MG tablet    Sig: Take 1 tablet (40 mg total) by mouth daily.    Dispense:  90 tablet    Refill:  3      Follow-up: Return in about 6 weeks (around 12/17/2023) for a follow-up visit.  Anitra Barn, MD

## 2023-11-06 LAB — CBC WITH DIFFERENTIAL/PLATELET
Basophils Absolute: 0.1 10*3/uL (ref 0.0–0.1)
Basophils Relative: 0.7 % (ref 0.0–3.0)
Eosinophils Absolute: 0.2 10*3/uL (ref 0.0–0.7)
Eosinophils Relative: 2.9 % (ref 0.0–5.0)
HCT: 38 % (ref 36.0–46.0)
Hemoglobin: 12.4 g/dL (ref 12.0–15.0)
Lymphocytes Relative: 36.3 % (ref 12.0–46.0)
Lymphs Abs: 2.8 10*3/uL (ref 0.7–4.0)
MCHC: 32.7 g/dL (ref 30.0–36.0)
MCV: 88.5 fl (ref 78.0–100.0)
Monocytes Absolute: 0.7 10*3/uL (ref 0.1–1.0)
Monocytes Relative: 8.7 % (ref 3.0–12.0)
Neutro Abs: 3.9 10*3/uL (ref 1.4–7.7)
Neutrophils Relative %: 51.4 % (ref 43.0–77.0)
Platelets: 237 10*3/uL (ref 150.0–400.0)
RBC: 4.29 Mil/uL (ref 3.87–5.11)
RDW: 15 % (ref 11.5–15.5)
WBC: 7.6 10*3/uL (ref 4.0–10.5)

## 2023-11-06 LAB — COMPREHENSIVE METABOLIC PANEL WITH GFR
ALT: 6 U/L (ref 0–35)
AST: 10 U/L (ref 0–37)
Albumin: 4.2 g/dL (ref 3.5–5.2)
Alkaline Phosphatase: 66 U/L (ref 39–117)
BUN: 9 mg/dL (ref 6–23)
CO2: 27 meq/L (ref 19–32)
Calcium: 8.9 mg/dL (ref 8.4–10.5)
Chloride: 104 meq/L (ref 96–112)
Creatinine, Ser: 0.83 mg/dL (ref 0.40–1.20)
GFR: 67.7 mL/min (ref >60.00)
Glucose, Bld: 87 mg/dL (ref 70–99)
Potassium: 4.1 meq/L (ref 3.5–5.1)
Sodium: 139 meq/L (ref 135–145)
Total Bilirubin: 0.9 mg/dL (ref 0.2–1.2)
Total Protein: 6.6 g/dL (ref 6.0–8.3)

## 2023-11-06 LAB — LIPID PANEL
Cholesterol: 185 mg/dL (ref 0–200)
HDL: 46.1 mg/dL (ref >39.00)
LDL Cholesterol: 113 mg/dL — ABNORMAL HIGH (ref 0–99)
NonHDL: 139.17
Total CHOL/HDL Ratio: 4
Triglycerides: 133 mg/dL (ref 0.0–149.0)
VLDL: 26.6 mg/dL (ref 0.0–40.0)

## 2023-11-06 LAB — TSH: TSH: 1.41 u[IU]/mL (ref 0.35–5.50)

## 2023-11-06 LAB — URINALYSIS
Bilirubin Urine: NEGATIVE
Hgb urine dipstick: NEGATIVE
Ketones, ur: NEGATIVE
Leukocytes,Ua: NEGATIVE
Nitrite: NEGATIVE
Specific Gravity, Urine: <=1.005 — AB (ref 1.000–1.030)
Total Protein, Urine: NEGATIVE
Urine Glucose: NEGATIVE
Urobilinogen, UA: 0.2 (ref 0.0–1.0)
pH: 6 (ref 5.0–8.0)

## 2023-11-06 LAB — VITAMIN B12: Vitamin B-12: >1500 pg/mL — ABNORMAL HIGH (ref 211–911)

## 2023-11-08 ENCOUNTER — Ambulatory Visit: Payer: Self-pay | Admitting: Internal Medicine

## 2023-12-01 NOTE — Assessment & Plan Note (Signed)
 Continue on verapamil

## 2023-12-01 NOTE — Assessment & Plan Note (Signed)
Lovastatin  ASA

## 2023-12-01 NOTE — Assessment & Plan Note (Signed)
 Obtain chest x-ray And tramadol  as needed call

## 2023-12-01 NOTE — Assessment & Plan Note (Signed)
 Continue lovastatin

## 2023-12-30 ENCOUNTER — Ambulatory Visit (INDEPENDENT_AMBULATORY_CARE_PROVIDER_SITE_OTHER): Payer: Medicare PPO

## 2023-12-30 VITALS — Ht 60.0 in | Wt 179.0 lb

## 2023-12-30 DIAGNOSIS — K635 Polyp of colon: Secondary | ICD-10-CM

## 2023-12-30 DIAGNOSIS — Z0000 Encounter for general adult medical examination without abnormal findings: Secondary | ICD-10-CM | POA: Diagnosis not present

## 2023-12-30 DIAGNOSIS — Z0100 Encounter for examination of eyes and vision without abnormal findings: Secondary | ICD-10-CM

## 2023-12-30 NOTE — Progress Notes (Cosign Needed Addendum)
 Subjective:   [name] is a 78 y.o. who presents for a Medicare Wellness preventive visit.  As a reminder, Annual Wellness Visits don't include a physical exam, and some assessments may be limited, especially if this visit is performed virtually. We may recommend an in-person follow-up visit with your provider if needed.  Visit Complete: Virtual I connected with  Rock MALVA Oto on 12/30/23 by a audio enabled telemedicine application and verified that I am speaking with the correct person using two identifiers.  Patient Location: Home  Provider Location: Office/Clinic  I discussed the limitations of evaluation and management by telemedicine. The patient expressed understanding and agreed to proceed.  Vital Signs: Because this visit was a virtual/telehealth visit, some criteria may be missing or patient reported. Any vitals not documented were not able to be obtained and vitals that have been documented are patient reported.  VideoDeclined- This patient declined Librarian, academic. Therefore the visit was completed with audio only.  Persons Participating in Visit: Patient.  AWV Questionnaire: No: Patient Medicare AWV questionnaire was not completed prior to this visit.  Cardiac Risk Factors include: advanced age (>84men, >28 women);dyslipidemia;hypertension;obesity (BMI >30kg/m2)     Objective:    Today's Vitals   12/30/23 0813  Weight: 179 lb (81.2 kg)  Height: 5' (1.524 m)   Body mass index is 34.96 kg/m.     12/30/2023    8:13 AM 02/28/2023    4:03 PM 12/26/2022   12:04 PM 01/22/2022    3:51 PM 01/08/2022    1:53 PM 01/05/2022    4:19 PM 09/04/2021    8:45 AM  Advanced Directives  Does Patient Have a Medical Advance Directive? No No Yes No No No   Type of Surveyor, minerals;Living will      Copy of Healthcare Power of Attorney in Chart?   No - copy requested      Would patient like information on creating a  medical advance directive? Yes (MAU/Ambulatory/Procedural Areas - Information given) No - Patient declined  No - Patient declined  No - Patient declined No - Guardian declined    Current Medications (verified) Outpatient Encounter Medications as of 12/30/2023  Medication Sig   acetaminophen  (TYLENOL ) 500 MG tablet Take 2 tablets (1,000 mg total) by mouth every 6 (six) hours as needed for mild pain or moderate pain.   ALPRAZolam  (XANAX ) 0.5 MG tablet Take 1 tablet (0.5 mg total) by mouth 2 (two) times daily as needed.   aspirin  81 MG chewable tablet Chew 1 tablet (81 mg total) by mouth 2 (two) times daily.   buPROPion  (WELLBUTRIN  XL) 150 MG 24 hr tablet TAKE 1 TABLET BY MOUTH EVERY DAY   Cholecalciferol  (VITAMIN D ) 2000 UNITS CAPS Take 2,000 Units by mouth daily.   fluticasone  (FLONASE ) 50 MCG/ACT nasal spray Place 2 sprays into both nostrils daily.   gabapentin  (NEURONTIN ) 100 MG capsule Take 1 capsule (100 mg total) by mouth at bedtime.   ibuprofen  (ADVIL ) 200 MG tablet Take 200 mg by mouth every 6 (six) hours as needed for moderate pain.   linaclotide  (LINZESS ) 290 MCG CAPS capsule Take 1 capsule (290 mcg total) by mouth daily.   lovastatin  (MEVACOR ) 40 MG tablet Take 1 tablet (40 mg total) by mouth daily.   Melatonin 5 MG CAPS Take 5 mg by mouth at bedtime as needed (sleep).   meloxicam  (MOBIC ) 15 MG tablet TAKE 1 TABLET BY MOUTH EVERY DAY  Multiple Vitamins-Minerals (PRESERVISION AREDS 2) CAPS Take 1 capsule by mouth daily.   ondansetron  (ZOFRAN ) 4 MG tablet Take 1 tablet (4 mg total) by mouth every 8 (eight) hours as needed for nausea.   pantoprazole  (PROTONIX ) 40 MG tablet TAKE 1 TABLET BY MOUTH EVERY DAY   PROLIA  60 MG/ML SOSY injection Inject into the skin.   solifenacin  (VESICARE ) 10 MG tablet Take 1 tablet (10 mg total) by mouth daily.   traMADol  (ULTRAM ) 50 MG tablet Take 1 tablet (50 mg total) by mouth every 6 (six) hours as needed for severe pain (pain score 7-10).   traZODone   (DESYREL ) 50 MG tablet Take 50 mg by mouth as needed for sleep.   verapamil  (CALAN -SR) 240 MG CR tablet Take 1 tablet (240 mg total) by mouth at bedtime.   vitamin B-12 (CYANOCOBALAMIN ) 500 MCG tablet Take 500 mcg by mouth daily.   diclofenac  Sodium (VOLTAREN ) 1 % GEL Apply 1 Application topically 4 (four) times daily as needed (pain).   No facility-administered encounter medications on file as of 12/30/2023.    Allergies (verified) Oxycodone , Levaquin  [levofloxacin ], Other, and Omnicef  [cefdinir ]   History: Past Medical History:  Diagnosis Date   Anemia    Anxiety    B12 DEFICIENCY    Blood transfusion without reported diagnosis    Cataract    Cholecystitis, unspecified    Chronic sinusitis    DEPRESSION    Diastolic dysfunction    DIVERTICULOSIS, COLON    DYSLIPIDEMIA    Dysrhythmia    PVC's   Fibromyalgia    GERD    GERD (gastroesophageal reflux disease)    Hyperplastic colon polyp    HYPERTENSION    INSOMNIA, PERSISTENT    Morbid obesity (HCC)    OSTEOARTHRITIS    Osteoporosis    PONV (postoperative nausea and vomiting)    Skin cancer 2018   upper lip    SYNCOPE    Ulcer of esophagus    VITAMIN D  DEFICIENCY    Past Surgical History:  Procedure Laterality Date   ABDOMINAL HYSTERECTOMY     APPENDECTOMY     BREAST BIOPSY Left 2010   Stero biopsy   BREAST EXCISIONAL BIOPSY Left 1985   Benign    CESAREAN SECTION     CHOLECYSTECTOMY     COLONOSCOPY     GASTROPLASTY     gatric stapling     LAPAROSCOPY N/A 01/30/2017   Procedure: LAPAROSCOPY DIAGNOSTIC WITH LAPAROSCOPIC LYSIS OF ADHESIONS;  Surgeon: Mikell Katz, MD;  Location: [facility];  Service: General;  Laterality: N/A;   MIDDLE EAR SURGERY Right    NASAL SINUS SURGERY Left 08/06/2018   Procedure: Endoscopic SPHENOIDECTOMY;  Surgeon: Jesus Oliphant, MD;  Location: [facility];  Service: ENT;  Laterality: Left;   revearsal of gastric stapling     TONSILLECTOMY     TOTAL KNEE ARTHROPLASTY      rt.   TOTAL KNEE REVISION Right 01/22/2022   Procedure: TOTAL KNEE REVISION;  Surgeon: Rubie Kemps, MD;  Location: [facility];  Service: Orthopedics;  Laterality: Right;   TUBAL LIGATION     TYMPANOPLASTY Right 10/22/2016   Procedure: RIGHT TYMPANOPLASTY;  Surgeon: Jesus Oliphant, MD;  Location: [facility];  Service: ENT;  Laterality: Right;   TYMPANOPLASTY Right 09/02/2017   Procedure: REVISION RIGHT TYMPANOPLASTY;  Surgeon: Jesus Oliphant, MD;  Location: [facility];  Service: ENT;  Laterality: Right;   Family History  Problem Relation Age of Onset  Arthritis Mother    Hypertension Mother        pulmonary   Heart disease Mother    Emphysema Father    Heart disease Father    Diabetes Sister    Lung cancer Sister    Diabetes Sister    COPD Sister    Heart Problems Daughter    Diabetes Daughter    Colon polyps Daughter    Colon cancer Maternal Aunt    Diabetes Maternal Aunt        x 4   Colon cancer Maternal Uncle    Diabetes Maternal Uncle        x 3   Breast cancer Paternal Aunt 43   Diabetes Paternal Uncle         1   Colon cancer Other        nephew   Stomach cancer Other    Kidney disease Other        niece   Esophageal cancer Neg Hx    Liver disease Neg Hx    Rectal cancer Neg Hx    Social History   Socioeconomic History   Marital status: Widowed    Spouse name: Not on file   Number of children: 2   Years of education: Not on file   Highest education level: Some college, no degree  Occupational History   Occupation: retired  Tobacco Use   Smoking status: Never   Smokeless tobacco: Never  Vaping Use   Vaping status: Never Used  Substance and Sexual Activity   Alcohol use: No   Drug use: No   Sexual activity: Not Currently    Birth control/protection: Post-menopausal, Surgical  Other Topics Concern   Not on file  Social History Narrative   Patient is right-handed. She lives with her husband in a one level home. She drinks 1-2 diet  Cokes a day and rarely drinks coffee. She does not exercise.   Social Drivers of Corporate investment banker Strain: Low Risk  (12/30/2023)   Overall Financial Resource Strain (CARDIA)    Difficulty of Paying Living Expenses: Not hard at all  Food Insecurity: No Food Insecurity (12/30/2023)   Hunger Vital Sign    Worried About Running Out of Food in the Last Year: Never true    Ran Out of Food in the Last Year: Never true  Transportation Needs: No Transportation Needs (12/30/2023)   PRAPARE - Administrator, Civil Service (Medical): No    Lack of Transportation (Non-Medical): No  Physical Activity: Sufficiently Active (12/30/2023)   Exercise Vital Sign    Days of Exercise per Week: 5 days    Minutes of Exercise per Session: 60 min  Recent Concern: Physical Activity - Insufficiently Active (12/30/2023)   Exercise Vital Sign    Days of Exercise per Week: 4 days    Minutes of Exercise per Session: 30 min  Stress: No Stress Concern Present (12/30/2023)   [employer] of Occupational Health - Occupational Stress Questionnaire    Feeling of Stress: Not at all  Recent Concern: Stress - Stress Concern Present (11/04/2023)   [employer] of Occupational Health - Occupational Stress Questionnaire    Feeling of Stress : To some extent  Social Connections: Moderately Integrated (12/30/2023)   Social Connection and Isolation Panel    Frequency of Communication with Friends and Family: More than three times a week    Frequency of Social Gatherings with Friends and Family: More than three times a  week    Attends Religious Services: More than 4 times per year    Active Member of Clubs or Organizations: Yes    Attends Banker Meetings: More than 4 times per year    Marital Status: Widowed    Tobacco Counseling Counseling given: No    Clinical Intake:  Pre-visit preparation completed: Yes  Pain : No/denies pain     BMI - recorded: 34.96 Nutritional Status:  BMI > 30  Obese Nutritional Risks: None Diabetes: No  Lab Results  Component Value Date   HGBA1C 5.6 08/31/2021     How often do you need to have someone help you when you read instructions, pamphlets, or other written materials from your doctor or pharmacy?: 1 - Never  Interpreter Needed?: No  Information entered by :: Verdie Saba, CMA   Activities of Daily Living     12/30/2023    8:17 AM  In your present state of health, do you have any difficulty performing the following activities:  Hearing? 0  Vision? 0  Difficulty concentrating or making decisions? 0  Walking or climbing stairs? 0  Dressing or bathing? 0  Doing errands, shopping? 0  Preparing Food and eating ? N  Using the Toilet? N  In the past six months, have you accidently leaked urine? N  Do you have problems with loss of bowel control? N  Managing your Medications? N  Managing your Finances? N  Housekeeping or managing your Housekeeping? N    Patient Care Team: Plotnikov, Karlynn GAILS, MD as PCP - General (Internal Medicine) Abran Norleen SAILOR, MD (Gastroenterology) Dolphus Reiter, MD (Rheumatology) Marcey Elspeth PARAS, MD as Consulting Physician (Ophthalmology)  I have updated your Care Teams any recent Medical Services you may have received from other providers in the past year.     Assessment:   This is a routine wellness examination for Tucker.  Hearing/Vision screen Hearing Screening - Comments:: Denies hearing difficulties   Vision Screening - Comments:: Wears eyeglasses for reading - referral to Dr Marcey   Goals Addressed               This Visit's Progress     Patient Stated (pt-stated)        Patient stated she tries to stay active .SABRA..babysitting her grandkids.         Depression Screen     12/30/2023    8:34 AM 03/27/2023    2:48 PM 12/26/2022   12:03 PM 10/23/2022    3:57 PM 01/05/2022    4:18 PM 11/07/2021    4:04 PM 12/21/2020    8:42 AM  PHQ 2/9 Scores  PHQ - 2 Score 0 0 0 0 0 1 0   PHQ- 9 Score 0          Fall Risk     12/30/2023    8:17 AM 03/27/2023    2:48 PM 12/26/2022   12:02 PM 12/26/2022   11:39 AM 10/23/2022    3:57 PM  Fall Risk   Falls in the past year? 0 0 0 0 0  Number falls in past yr: 0 0 0 0 0  Injury with Fall? 0 0 0 1 0  Risk for fall due to : No Fall Risks No Fall Risks No Fall Risks  No Fall Risks  Follow up Falls evaluation completed;Falls prevention discussed Falls evaluation completed Falls prevention discussed  Falls evaluation completed    MEDICARE RISK AT HOME:  Medicare Risk at Home  Any stairs in or around the home?: No If so, are there any without handrails?: No Home free of loose throw rugs in walkways, pet beds, electrical cords, etc?: Yes Adequate lighting in your home to reduce risk of falls?: Yes Life alert?: Yes (security system) Use of a cane, walker or w/c?: No Grab bars in the bathroom?: Yes Shower chair or bench in shower?: Yes Elevated toilet seat or a handicapped toilet?: Yes  TIMED UP AND GO:  Was the test performed?  No  Cognitive Function: 6CIT completed        12/30/2023    8:20 AM 12/26/2022   12:04 PM 01/05/2022    4:21 PM  6CIT Screen  What Year? 0 points 0 points 0 points  What month? 0 points 0 points 0 points  What time? 0 points 0 points 0 points  Count back from 20 0 points 0 points 0 points  Months in reverse 0 points 0 points 0 points  Repeat phrase 0 points 0 points 0 points  Total Score 0 points 0 points 0 points    Immunizations Immunization History  Administered Date(s) Administered   Fluad Quad(high Dose 65+) 02/04/2019, 04/08/2020, 03/14/2021, 03/08/2022   Influenza Split 05/21/2011   Influenza Whole 03/23/2008, 03/23/2009, 03/01/2010   Influenza, High Dose Seasonal PF 04/21/2013, 02/23/2016, 02/27/2017, 02/26/2018   Influenza,inj,Quad PF,6+ Mos 02/26/2014   Influenza-Unspecified 05/25/2015   PFIZER(Purple Top)SARS-COV-2 Vaccination 08/02/2019, 08/24/2019   Pneumococcal  Conjugate-13 01/16/2017   Pneumococcal Polysaccharide-23 06/16/2013   Td 10/14/2009   Tdap 07/15/2021   Zoster, Live 07/28/2010    Screening Tests Health Maintenance  Topic Date Due   COVID-19 Vaccine (3 - Pfizer risk series) 09/21/2019   Colonoscopy  02/10/2024   INFLUENZA VACCINE  01/17/2024   Medicare Annual Wellness (AWV)  12/29/2024   DTaP/Tdap/Td (3 - Td or Tdap) 07/16/2031   Pneumococcal Vaccine: 50+ Years  Completed   DEXA SCAN  Completed   Hepatitis C Screening  Completed   Hepatitis B Vaccines  Aged Out   HPV VACCINES  Aged Out   Meningococcal B Vaccine  Aged Out   Zoster Vaccines- Shingrix  Discontinued    Health Maintenance  Health Maintenance Due  Topic Date Due   COVID-19 Vaccine (3 - Pfizer risk series) 09/21/2019   Colonoscopy  02/10/2024   Health Maintenance Items Addressed:  Referral sent to GI for colonoscopy, Referral sent to Optometry/Ophthalmology  Additional Screening:  Vision Screening: Recommended annual ophthalmology exams for early detection of glaucoma and other disorders of the eye. Would you like a referral to an eye doctor? Yes - referral sent to Dr Marcey  Dental Screening: Recommended annual dental exams for proper oral hygiene  Community Resource Referral / Chronic Care Management: CRR required this visit?  No   CCM required this visit?  No   Plan:    I have personally reviewed and noted the following in the patient's chart:   Medical and social history Use of alcohol, tobacco or illicit drugs  Current medications and supplements including opioid prescriptions. Patient is currently taking opioid prescriptions. Information provided to patient regarding non-opioid alternatives. Patient advised to discuss non-opioid treatment plan with their provider. Functional ability and status Nutritional status Physical activity Advanced directives List of other physicians Hospitalizations, surgeries, and ER visits in previous 12  months Vitals Screenings to include cognitive, depression, and falls Referrals and appointments  In addition, I have reviewed and discussed with patient certain preventive protocols, quality metrics, and best practice recommendations.  A written personalized care plan for preventive services as well as general preventive health recommendations were provided to patient.   Verdie CHRISTELLA Saba, CMA   12/30/2023   After Visit Summary: (MyChart) Due to this being a telephonic visit, the after visit summary with patients personalized plan was offered to patient via MyChart   Notes: Nothing significant to report at this time.  Medical screening examination/treatment/procedure(s) were performed by non-physician practitioner and as supervising physician I was immediately available for consultation/collaboration.  I agree with above. Karlynn Noel, MD

## 2023-12-30 NOTE — Patient Instructions (Signed)
 [name] , Thank you for taking time out of your busy schedule to complete your Annual Wellness Visit with me. I enjoyed our conversation and look forward to speaking with you again next year. I, as well as your care team,  appreciate your ongoing commitment to your health goals. Please review the following plan we discussed and let me know if I can assist you in the future. Your Game plan/ To Do List    Referrals: If you haven't heard from the office you've been referred to, please reach out to them at the phone provided.  Referral to Dr Marcey for an eye exam; Referral to Dr Abran for a repeat Colonoscopy due to history of colon polyps Follow up Visits: Next Medicare AWV with our clinical staff: 12/31/2024   Have you seen your provider in the last 6 months (3 months if uncontrolled diabetes)? Yes Next Office Visit with your provider: 01/02/2024  Clinician Recommendations:  Aim for 30 minutes of exercise or brisk walking, 6-8 glasses of water , and 5 servings of fruits and vegetables each day.       This is a list of the screening recommended for you and due dates:  Health Maintenance  Topic Date Due   COVID-19 Vaccine (3 - Pfizer risk series) 09/21/2019   Colon Cancer Screening  02/10/2024   Flu Shot  01/17/2024   Medicare Annual Wellness Visit  12/29/2024   DTaP/Tdap/Td vaccine (3 - Td or Tdap) 07/16/2031   Pneumococcal Vaccine for age over 22  Completed   DEXA scan (bone density measurement)  Completed   Hepatitis C Screening  Completed   Hepatitis B Vaccine  Aged Out   HPV Vaccine  Aged Out   Meningitis B Vaccine  Aged Out   Zoster (Shingles) Vaccine  Discontinued    Advanced directives: (Provided) Advance directive discussed with you today. I have provided a copy for you to complete at home and have notarized. Once this is complete, please bring a copy in to our office so we can scan it into your chart.  Advance Care Planning is important because it:  [x]  Makes sure you receive  the medical care that is consistent with your values, goals, and preferences  [x]  It provides guidance to your family and loved ones and reduces their decisional burden about whether or not they are making the right decisions based on your wishes.  Follow the link provided in your after visit summary or read over the paperwork we have mailed to you to help you started getting your Advance Directives in place. If you need assistance in completing these, please reach out to us  so that we can help you!   Managing Pain Without Opioids Opioids are strong medicines used to treat moderate to severe pain. For some people, especially those who have long-term (chronic) pain, opioids may not be the best choice for pain management due to: Side effects like nausea, constipation, and sleepiness. The risk of addiction (opioid use disorder). The longer you take opioids, the greater your risk of addiction. Pain that lasts for more than 3 months is called chronic pain. Managing chronic pain usually requires more than one approach and is often provided by a team of health care providers working together (multidisciplinary approach). Pain management may be done at a pain management center or pain clinic. How to manage pain without the use of opioids Use non-opioid medicines Non-opioid medicines for pain may include: Over-the-counter or prescription non-steroidal anti-inflammatory drugs (NSAIDs). These may be  the first medicines used for pain. They work well for muscle and bone pain, and they reduce swelling. Acetaminophen . This over-the-counter medicine may work well for milder pain but not swelling. Antidepressants. These may be used to treat chronic pain. A certain type of antidepressant (tricyclics) is often used. These medicines are given in lower doses for pain than when used for depression. Anticonvulsants. These are usually used to treat seizures but may also reduce nerve (neuropathic) pain. Muscle relaxants.  These relieve pain caused by sudden muscle tightening (spasms). You may also use a pain medicine that is applied to the skin as a patch, cream, or gel (topical analgesic), such as a numbing medicine. These may cause fewer side effects than medicines taken by mouth. Do certain therapies as directed Some therapies can help with pain management. They include: Physical therapy. You will do exercises to gain strength and flexibility. A physical therapist may teach you exercises to move and stretch parts of your body that are weak, stiff, or painful. You can learn these exercises at physical therapy visits and practice them at home. Physical therapy may also involve: Massage. Heat wraps or applying heat or cold to affected areas. Electrical signals that interrupt pain signals (transcutaneous electrical nerve stimulation, TENS). Weak lasers that reduce pain and swelling (low-level laser therapy). Signals from your body that help you learn to regulate pain (biofeedback). Occupational therapy. This helps you to learn ways to function at home and work with less pain. Recreational therapy. This involves trying new activities or hobbies, such as a physical activity or drawing. Mental health therapy, including: Cognitive behavioral therapy (CBT). This helps you learn coping skills for dealing with pain. Acceptance and commitment therapy (ACT) to change the way you think and react to pain. Relaxation therapies, including muscle relaxation exercises and mindfulness-based stress reduction. Pain management counseling. This may be individual, family, or group counseling.  Receive medical treatments Medical treatments for pain management include: Nerve block injections. These may include a pain blocker and anti-inflammatory medicines. You may have injections: Near the spine to relieve chronic back or neck pain. Into joints to relieve back or joint pain. Into nerve areas that supply a painful area to relieve body  pain. Into muscles (trigger point injections) to relieve some painful muscle conditions. A medical device placed near your spine to help block pain signals and relieve nerve pain or chronic back pain (spinal cord stimulation device). Acupuncture. Follow these instructions at home Medicines Take over-the-counter and prescription medicines only as told by your health care provider. If you are taking pain medicine, ask your health care providers about possible side effects to watch out for. Do not drive or use heavy machinery while taking prescription opioid pain medicine. Lifestyle  Do not use drugs or alcohol to reduce pain. If you drink alcohol, limit how much you have to: 0-1 drink a day for women who are not pregnant. 0-2 drinks a day for men. Know how much alcohol is in a drink. In the [place] one drink equals one 12 oz bottle of beer (355 mL), one 5 oz glass of wine (148 mL), or one 1 oz glass of hard liquor (44 mL). Do not use any products that contain nicotine or tobacco. These products include cigarettes, chewing tobacco, and vaping devices, such as e-cigarettes. If you need help quitting, ask your health care provider. Eat a healthy diet and maintain a healthy weight. Poor diet and excess weight may make pain worse. Eat foods that are high in  fiber. These include fresh fruits and vegetables, whole grains, and beans. Limit foods that are high in fat and processed sugars, such as fried and sweet foods. Exercise regularly. Exercise lowers stress and may help relieve pain. Ask your health care provider what activities and exercises are safe for you. If your health care provider approves, join an exercise class that combines movement and stress reduction. Examples include yoga and tai chi. Get enough sleep. Lack of sleep may make pain worse. Lower stress as much as possible. Practice stress reduction techniques as told by your therapist. General instructions Work with all your pain  management providers to find the treatments that work best for you. You are an important member of your pain management team. There are many things you can do to reduce pain on your own. Consider joining an online or in-person support group for people who have chronic pain. Keep all follow-up visits. This is important. Where to find more information You can find more information about managing pain without opioids from: American Academy of Pain Medicine: painmed.org Institute for Chronic Pain: instituteforchronicpain.org American Chronic Pain Association: theacpa.org Contact a health care provider if: You have side effects from pain medicine. Your pain gets worse or does not get better with treatments or home therapy. You are struggling with anxiety or depression. Summary Many types of pain can be managed without opioids. Chronic pain may respond better to pain management without opioids. Pain is best managed when you and a team of health care providers work together. Pain management without opioids may include non-opioid medicines, medical treatments, physical therapy, mental health therapy, and lifestyle changes. Tell your health care providers if your pain gets worse or is not being managed well enough. This information is not intended to replace advice given to you by your health care provider. Make sure you discuss any questions you have with your health care provider. Document Revised: 09/14/2020 Document Reviewed: 09/14/2020 Elsevier Patient Education  2024 [employer].

## 2024-01-02 ENCOUNTER — Ambulatory Visit: Admitting: Internal Medicine

## 2024-01-17 ENCOUNTER — Ambulatory Visit (INDEPENDENT_AMBULATORY_CARE_PROVIDER_SITE_OTHER): Admitting: Internal Medicine

## 2024-01-17 ENCOUNTER — Encounter: Payer: Self-pay | Admitting: Internal Medicine

## 2024-01-17 VITALS — BP 137/82 | HR 80 | Temp 98.0°F | Ht 60.0 in | Wt 187.0 lb

## 2024-01-17 DIAGNOSIS — G44049 Chronic paroxysmal hemicrania, not intractable: Secondary | ICD-10-CM | POA: Diagnosis not present

## 2024-01-17 DIAGNOSIS — E538 Deficiency of other specified B group vitamins: Secondary | ICD-10-CM

## 2024-01-17 DIAGNOSIS — R058 Other specified cough: Secondary | ICD-10-CM | POA: Diagnosis not present

## 2024-01-17 DIAGNOSIS — K5901 Slow transit constipation: Secondary | ICD-10-CM | POA: Diagnosis not present

## 2024-01-17 MED ORDER — VERAPAMIL HCL ER 240 MG PO TBCR: 240.0000 mg | EXTENDED_RELEASE_TABLET | Freq: Every day | ORAL | 3 refills | Status: AC

## 2024-01-17 MED ORDER — LINACLOTIDE 290 MCG PO CAPS: 290.0000 ug | ORAL_CAPSULE | Freq: Every day | ORAL | 3 refills | Status: AC

## 2024-01-17 NOTE — Assessment & Plan Note (Addendum)
 Linzess  every day Use Miralax  1-2 scoops a day and Linzess  daily

## 2024-01-17 NOTE — Progress Notes (Signed)
 Subjective:  Patient ID: [name], female    DOB: [birth year]  Age: 78 y.o. MRN: [id number]  CC: Medical Management of Chronic Issues   HPI [name] presents for HTN, constipation, cough - much better on Tramadol   Outpatient Medications Prior to Visit  Medication Sig Dispense Refill   acetaminophen  (TYLENOL ) 500 MG tablet Take 2 tablets (1,000 mg total) by mouth every 6 (six) hours as needed for mild pain or moderate pain. 30 tablet 0   ALPRAZolam  (XANAX ) 0.5 MG tablet Take 1 tablet (0.5 mg total) by mouth 2 (two) times daily as needed. 60 tablet 3   aspirin  81 MG chewable tablet Chew 1 tablet (81 mg total) by mouth 2 (two) times daily. 30 tablet 0   buPROPion  (WELLBUTRIN  XL) 150 MG 24 hr tablet TAKE 1 TABLET BY MOUTH EVERY DAY 90 tablet 1   Cholecalciferol  (VITAMIN D ) 2000 UNITS CAPS Take 2,000 Units by mouth daily.     diclofenac  Sodium (VOLTAREN ) 1 % GEL Apply 1 Application topically 4 (four) times daily as needed (pain).     fluticasone  (FLONASE ) 50 MCG/ACT nasal spray Place 2 sprays into both nostrils daily. 48 g 3   gabapentin  (NEURONTIN ) 100 MG capsule Take 1 capsule (100 mg total) by mouth at bedtime. 90 capsule 3   ibuprofen  (ADVIL ) 200 MG tablet Take 200 mg by mouth every 6 (six) hours as needed for moderate pain.     lovastatin  (MEVACOR ) 40 MG tablet Take 1 tablet (40 mg total) by mouth daily. 90 tablet 3   Melatonin 5 MG CAPS Take 5 mg by mouth at bedtime as needed (sleep).     meloxicam  (MOBIC ) 15 MG tablet TAKE 1 TABLET BY MOUTH EVERY DAY 30 tablet 1   Multiple Vitamins-Minerals (PRESERVISION AREDS 2) CAPS Take 1 capsule by mouth daily.     ondansetron  (ZOFRAN ) 4 MG tablet Take 1 tablet (4 mg total) by mouth every 8 (eight) hours as needed for nausea. 20 tablet 0   pantoprazole  (PROTONIX ) 40 MG tablet TAKE 1 TABLET BY MOUTH EVERY DAY 90 tablet 2   PROLIA  60 MG/ML SOSY injection Inject into the skin.     solifenacin  (VESICARE ) 10 MG tablet Take 1 tablet (10 mg total)  by mouth daily. 90 tablet 3   traMADol  (ULTRAM ) 50 MG tablet Take 1 tablet (50 mg total) by mouth every 6 (six) hours as needed for severe pain (pain score 7-10). 20 tablet 1   traZODone  (DESYREL ) 50 MG tablet Take 50 mg by mouth as needed for sleep.     vitamin B-12 (CYANOCOBALAMIN ) 500 MCG tablet Take 500 mcg by mouth daily.     linaclotide  (LINZESS ) 290 MCG CAPS capsule Take 1 capsule (290 mcg total) by mouth daily. 30 capsule 11   verapamil  (CALAN -SR) 240 MG CR tablet Take 1 tablet (240 mg total) by mouth at bedtime. 90 tablet 3   No facility-administered medications prior to visit.    ROS: Review of Systems  Constitutional:  Negative for activity change, appetite change, chills, fatigue and unexpected weight change.  HENT:  Negative for congestion, mouth sores and sinus pressure.   Eyes:  Negative for visual disturbance.  Respiratory:  Positive for cough. Negative for chest tightness and shortness of breath.   Gastrointestinal:  Negative for abdominal pain and nausea.  Genitourinary:  Negative for difficulty urinating, frequency and vaginal pain.  Musculoskeletal:  Negative for back pain and gait problem.  Skin:  Negative for pallor and  rash.  Neurological:  Negative for dizziness, tremors, weakness, numbness and headaches.  Psychiatric/Behavioral:  Negative for confusion and sleep disturbance.     Objective:  BP 137/82   Pulse 80   Temp 98 F (36.7 C) (Oral)   Ht 5' (1.524 m)   Wt 187 lb (84.8 kg)   SpO2 98%   BMI 36.52 kg/m   BP Readings from Last 3 Encounters:  01/17/24 137/82  11/05/23 132/78  04/30/23 110/80    Wt Readings from Last 3 Encounters:  01/17/24 187 lb (84.8 kg)  12/30/23 179 lb (81.2 kg)  11/05/23 179 lb 6.4 oz (81.4 kg)    Physical Exam Constitutional:      General: She is not in acute distress.    Appearance: She is well-developed.  HENT:     Head: Normocephalic.     Right Ear: External ear normal.     Left Ear: External ear normal.      Nose: Nose normal.  Eyes:     General:        Right eye: No discharge.        Left eye: No discharge.     Conjunctiva/sclera: Conjunctivae normal.     Pupils: Pupils are equal, round, and reactive to light.  Neck:     Thyroid : No thyromegaly.     Vascular: No JVD.     Trachea: No tracheal deviation.  Cardiovascular:     Rate and Rhythm: Normal rate and regular rhythm.     Heart sounds: Normal heart sounds.  Pulmonary:     Effort: No respiratory distress.     Breath sounds: No stridor. No wheezing.  Abdominal:     General: Bowel sounds are normal. There is no distension.     Palpations: Abdomen is soft. There is no mass.     Tenderness: There is no abdominal tenderness. There is no guarding or rebound.  Musculoskeletal:        General: No tenderness.     Cervical back: Normal range of motion and neck supple. No rigidity.  Lymphadenopathy:     Cervical: No cervical adenopathy.  Skin:    Findings: No erythema or rash.  Neurological:     Cranial Nerves: No cranial nerve deficit.     Motor: No abnormal muscle tone.     Coordination: Coordination normal.     Deep Tendon Reflexes: Reflexes normal.  Psychiatric:        Behavior: Behavior normal.        Thought Content: Thought content normal.        Judgment: Judgment normal.   Did not cough for in the office today  Lab Results  Component Value Date   WBC 7.6 11/05/2023   HGB 12.4 11/05/2023   HCT 38.0 11/05/2023   PLT 237.0 11/05/2023   GLUCOSE 85 01/21/2024   CHOL 185 11/05/2023   TRIG 133.0 11/05/2023   HDL 46.10 11/05/2023   LDLDIRECT 111.0 02/04/2019   LDLCALC 113 (H) 11/05/2023   ALT 7 01/21/2024   AST 11 01/21/2024   NA 142 01/21/2024   K 4.0 01/21/2024   CL 105 01/21/2024   CREATININE 0.81 01/21/2024   BUN 12 01/21/2024   CO2 31 01/21/2024   TSH 1.41 11/05/2023   INR 1.1 08/31/2021   HGBA1C 5.6 08/31/2021    MM 3D SCREENING MAMMOGRAM BILATERAL BREAST Result Date: 08/28/2023 CLINICAL DATA:  Screening.  EXAM: DIGITAL SCREENING BILATERAL MAMMOGRAM WITH TOMOSYNTHESIS AND CAD TECHNIQUE: Bilateral screening digital craniocaudal and  mediolateral oblique mammograms were obtained. Bilateral screening digital breast tomosynthesis was performed. The images were evaluated with computer-aided detection. COMPARISON:  Previous exam(s). ACR Breast Density Category b: There are scattered areas of fibroglandular density. FINDINGS: There are no findings suspicious for malignancy. IMPRESSION: No mammographic evidence of malignancy. A result letter of this screening mammogram will be mailed directly to the patient. RECOMMENDATION: Screening mammogram in one year. (Code:SM-B-01Y) BI-RADS CATEGORY  1: Negative. Electronically Signed   By: Alm Parkins [name]   On: 08/28/2023 10:38    Assessment & Plan:   Problem List Items Addressed This Visit     B12 deficiency   On B12      Relevant Orders   Comprehensive metabolic panel with GFR (Completed)   Chronic paroxysmal hemicrania, not intractable   No recent headaches      Relevant Medications   verapamil  (CALAN -SR) 240 MG CR tablet   Constipation - Primary   Linzess  every day Use Miralax  1-2 scoops a day and Linzess  daily      Relevant Orders   Comprehensive metabolic panel with GFR (Completed)   Cough   It appears to be resolving Use tramadol  as needed  If problems-call         Meds ordered this encounter  Medications   linaclotide  (LINZESS ) 290 MCG CAPS capsule    Sig: Take 1 capsule (290 mcg total) by mouth daily.    Dispense:  90 capsule    Refill:  3   verapamil  (CALAN -SR) 240 MG CR tablet    Sig: Take 1 tablet (240 mg total) by mouth at bedtime.    Dispense:  90 tablet    Refill:  3      Follow-up: Return in about 3 months (around 04/18/2024) for a follow-up visit.  Marolyn Noel, MD

## 2024-01-17 NOTE — Patient Instructions (Signed)
 Use Miralax  1-2 scoops a day and Linzess  daily

## 2024-01-17 NOTE — Assessment & Plan Note (Signed)
 On B12

## 2024-01-21 ENCOUNTER — Other Ambulatory Visit (INDEPENDENT_AMBULATORY_CARE_PROVIDER_SITE_OTHER)

## 2024-01-21 DIAGNOSIS — K5901 Slow transit constipation: Secondary | ICD-10-CM

## 2024-01-21 DIAGNOSIS — E538 Deficiency of other specified B group vitamins: Secondary | ICD-10-CM

## 2024-01-21 LAB — COMPREHENSIVE METABOLIC PANEL WITH GFR
ALT: 7 U/L (ref 0–35)
AST: 11 U/L (ref 0–37)
Albumin: 4.1 g/dL (ref 3.5–5.2)
Alkaline Phosphatase: 59 U/L (ref 39–117)
BUN: 12 mg/dL (ref 6–23)
CO2: 31 meq/L (ref 19–32)
Calcium: 9 mg/dL (ref 8.4–10.5)
Chloride: 105 meq/L (ref 96–112)
Creatinine, Ser: 0.81 mg/dL (ref 0.40–1.20)
GFR: 69.61 mL/min (ref >60.00)
Glucose, Bld: 85 mg/dL (ref 70–99)
Potassium: 4 meq/L (ref 3.5–5.1)
Sodium: 142 meq/L (ref 135–145)
Total Bilirubin: 0.9 mg/dL (ref 0.2–1.2)
Total Protein: 6.5 g/dL (ref 6.0–8.3)

## 2024-01-22 ENCOUNTER — Ambulatory Visit: Payer: Self-pay | Admitting: Internal Medicine

## 2024-01-26 NOTE — Assessment & Plan Note (Signed)
No recent headaches. 

## 2024-01-26 NOTE — Assessment & Plan Note (Signed)
 It appears to be resolving Use tramadol  as needed  If problems-call

## 2024-03-10 DIAGNOSIS — L570 Actinic keratosis: Secondary | ICD-10-CM | POA: Diagnosis not present

## 2024-03-10 DIAGNOSIS — L820 Inflamed seborrheic keratosis: Secondary | ICD-10-CM | POA: Diagnosis not present

## 2024-03-10 DIAGNOSIS — Z85828 Personal history of other malignant neoplasm of skin: Secondary | ICD-10-CM | POA: Diagnosis not present

## 2024-03-10 DIAGNOSIS — L821 Other seborrheic keratosis: Secondary | ICD-10-CM | POA: Diagnosis not present

## 2024-03-10 DIAGNOSIS — L720 Epidermal cyst: Secondary | ICD-10-CM | POA: Diagnosis not present

## 2024-03-30 ENCOUNTER — Ambulatory Visit: Admitting: Cardiology

## 2024-04-19 ENCOUNTER — Other Ambulatory Visit: Payer: Self-pay | Admitting: Internal Medicine

## 2024-04-20 ENCOUNTER — Other Ambulatory Visit: Payer: Self-pay | Admitting: Internal Medicine

## 2024-04-21 ENCOUNTER — Ambulatory Visit: Attending: Cardiology | Admitting: Cardiology

## 2024-04-21 VITALS — BP 180/88 | HR 79 | Ht 60.0 in | Wt 187.0 lb

## 2024-04-21 DIAGNOSIS — I700 Atherosclerosis of aorta: Secondary | ICD-10-CM

## 2024-04-21 DIAGNOSIS — E785 Hyperlipidemia, unspecified: Secondary | ICD-10-CM | POA: Diagnosis not present

## 2024-04-21 DIAGNOSIS — I10 Essential (primary) hypertension: Secondary | ICD-10-CM

## 2024-04-21 DIAGNOSIS — R931 Abnormal findings on diagnostic imaging of heart and coronary circulation: Secondary | ICD-10-CM

## 2024-04-21 DIAGNOSIS — I493 Ventricular premature depolarization: Secondary | ICD-10-CM | POA: Diagnosis not present

## 2024-04-21 NOTE — Patient Instructions (Signed)
 Medication Instructions:    Recommend primary provider -- to try a different  ( more potent)  statin for elevated cholesterol  No changes  *If you need a refill on your cardiac medications before your next appointment, please call your pharmacy*   Lab Work: Not needed   Testing/Procedures:  Not needed  Follow-Up: At [facility], you and your health needs are our priority.  As part of our continuing mission to provide you with exceptional heart care, we have created designated Provider Care Teams.  These Care Teams include your primary Cardiologist (physician) and Advanced Practice Providers (APPs -  Physician Assistants and Nurse Practitioners) who all work together to provide you with the care you need, when you need it.     Your next appointment:   As needed    The format for your next appointment:   In Person  Provider:   None   Other Instructions   Recommend primary provider -- to try a different  ( more potent)  statin for elevated cholesterol

## 2024-04-21 NOTE — Progress Notes (Signed)
 Cardiology Office Note:  .   Date:  04/25/2024  ID:  [name], DOB 1945-12-19, MRN [id number] PCP: Garald Karlynn GAILS, MD  [facility] Providers Cardiologist:  None     Chief Complaint  Patient presents with   New Patient (Initial Visit)    Evaluation of Coronary Calcium Score    Patient Profile: .     [name] is a very pleasant 78 y.o. female  with a PMH notable for HTN (clearly labile with whitecoat hypertension, HLD, mild to moderately elevated CAC who presents here for discussion about CAC score and CAD risk at the request of Plotnikov, Karlynn GAILS, MD.   [name] was seen by Dr. Garald in August and there were no complaints.  No major changes made.  Her blood pressure was well-controlled at 137/82.  She denied any active symptoms.  She was concerned about her CAC score results from several years ago and asked about going to see cardiologist. Not citing that she is having any symptoms, just slowing down some in her increasing age.  Subjective  Discussed the use of AI scribe software for clinical note transcription with the patient, who gave verbal consent to proceed.  History of Present Illness [name] is a 78 year old female with coronary artery disease who presents for evaluation of her coronary calcium score and related symptoms.  A coronary calcium score of 164 was obtained in March 2023. An echocardiogram in March 2023 showed a left ventricular ejection fraction of 60-65%, no wall motion abnormalities, and pulmonary arterial pressures slightly above normal.  She experiences palpitations described as 'short little bubbling' sensations, which do not last long and are not associated with chest discomfort or syncope. She also experiences random dull chest pains that are not associated with exertion and resolve spontaneously. No shortness of breath, orthopnea, paroxysmal nocturnal dyspnea, leg swelling, or syncope.  Her current medications include  verapamil  for blood pressure, lovastatin  for cholesterol, aspirin , Prolia  for osteoporosis, and Linzess  for irritable bowel syndrome. She was previously on omeprazole  but switched to Protoprolol 40 mg twice a day for acid reflux.  Her family history is significant for early onset heart disease in her father, who had heart disease in his forties and lived to 2, and her mother, who had heart disease later in life and died of pulmonary hypertension.  No history of diabetes, smoking, or stroke symptoms. Recent laboratory results from May 2025 showed a cholesterol level of 185 mg/dL, HDL of 46 mg/dL, LDL of 886 mg/dL, triglycerides of 866 mg/dL, hemoglobin of 87.5 g/dL, creatinine of 9.18 mg/dL, potassium of 4.0 mmol/L, and TSH of 1.4 mIU/L.   Cardiovascular ROS: positive for - chest pain, irregular heartbeat, palpitations, and chest pain is not described as a sharp or pressure sensation more of a dull ache that is random and does not occur with exertion.  Palpitation/irregular piece describes a bubbling sensation that lasts a few seconds.  Nothing prolonged. negative for - dyspnea on exertion, edema, orthopnea, paroxysmal nocturnal dyspnea, rapid heart rate, shortness of breath, or lightheadedness, dizziness or wooziness, syncope/near syncope or TIA/amaurosis fugax, claudication.  Melena, hematochezia, hematuria or epistaxis.  ROS:  Review of Systems - Negative except any symptoms noted above.    Objective   Past Medical History:  Diagnosis Date   Anemia    Anxiety    B12 DEFICIENCY    Blood transfusion without reported diagnosis    Cataract    Cholecystitis, unspecified  Chronic sinusitis    DEPRESSION    Diastolic dysfunction    DIVERTICULOSIS, COLON    DYSLIPIDEMIA    Dysrhythmia    PVC's   Fibromyalgia    GERD    GERD (gastroesophageal reflux disease)    Hyperplastic colon polyp    HYPERTENSION    INSOMNIA, PERSISTENT    Morbid obesity (HCC)    OSTEOARTHRITIS     Osteoporosis    PONV (postoperative nausea and vomiting)    Skin cancer 2018   upper lip    SYNCOPE    Ulcer of esophagus    VITAMIN D  DEFICIENCY    Past Surgical History:  Procedure Laterality Date   ABDOMINAL HYSTERECTOMY     APPENDECTOMY     BREAST BIOPSY Left 2010   Stero biopsy   BREAST EXCISIONAL BIOPSY Left 1985   Benign    CESAREAN SECTION     CHOLECYSTECTOMY     COLONOSCOPY     GASTROPLASTY     gatric stapling     LAPAROSCOPY N/A 01/30/2017   Procedure: LAPAROSCOPY DIAGNOSTIC WITH LAPAROSCOPIC LYSIS OF ADHESIONS;  Surgeon: Mikell Katz, MD;  Location: [facility];  Service: General;  Laterality: N/A;   MIDDLE EAR SURGERY Right    NASAL SINUS SURGERY Left 08/06/2018   Procedure: Endoscopic SPHENOIDECTOMY;  Surgeon: Jesus Oliphant, MD;  Location: [facility];  Service: ENT;  Laterality: Left;   revearsal of gastric stapling     TONSILLECTOMY     TOTAL KNEE ARTHROPLASTY     rt.   TOTAL KNEE REVISION Right 01/22/2022   Procedure: TOTAL KNEE REVISION;  Surgeon: Rubie Kemps, MD;  Location: [facility];  Service: Orthopedics;  Laterality: Right;   TUBAL LIGATION     TYMPANOPLASTY Right 10/22/2016   Procedure: RIGHT TYMPANOPLASTY;  Surgeon: Jesus Oliphant, MD;  Location: [facility];  Service: ENT;  Laterality: Right;   TYMPANOPLASTY Right 09/02/2017   Procedure: REVISION RIGHT TYMPANOPLASTY;  Surgeon: Jesus Oliphant, MD;  Location: [facility];  Service: ENT;  Laterality: Right;   Social History   Tobacco Use   Smoking status: Never   Smokeless tobacco: Never  Vaping Use   Vaping status: Never Used  Substance Use Topics   Alcohol use: No   Drug use: No  Family History: family history includes Arthritis in her mother; Breast cancer (age of onset: 20) in her paternal aunt; COPD in her sister; Colon cancer in her maternal aunt, maternal uncle, and another family member; Colon polyps in her daughter; Diabetes in her daughter, maternal aunt,  maternal uncle, paternal uncle, sister, and sister; Emphysema in her father; Heart Problems in her daughter; Heart disease in her father and mother; Hypertension in her mother; Kidney disease in an other family member; Lung cancer in her sister; Stomach cancer in an other family member.   Current Meds   Cardiac Meds Sig   aspirin  81 MG chewable tablet Chew 1 tablet (81 mg total) by mouth 2 (two) times daily.   lovastatin  (MEVACOR ) 40 MG tablet Take 1 tablet (40 mg total) by mouth daily.   verapamil  (CALAN -SR) 240 MG CR tablet Take 1 tablet (240 mg total) by mouth at bedtime.    Medication Sig   ALPRAZolam  (XANAX ) 0.5 MG tablet Take 1 tablet (0.5 mg total) by mouth 2 (two) times daily as needed.   buPROPion  (WELLBUTRIN  XL) 150 MG 24 hr tablet TAKE 1 TABLET BY MOUTH EVERY DAY   Cholecalciferol  (VITAMIN D ) 2000 UNITS  CAPS Take 2,000 Units by mouth daily.   diclofenac  Sodium (VOLTAREN ) 1 % GEL Apply 1 Application topically 4 (four) times daily as needed (pain).   fluticasone  (FLONASE ) 50 MCG/ACT nasal spray Place 2 sprays into both nostrils daily.   gabapentin  (NEURONTIN ) 100 MG capsule Take 1 capsule (100 mg total) by mouth at bedtime.   ibuprofen  (ADVIL ) 200 MG tablet Take 200 mg by mouth every 6 (six) hours as needed for moderate pain.   linaclotide  (LINZESS ) 290 MCG CAPS capsule Take 1 capsule (290 mcg total) by mouth daily.   Melatonin 5 MG CAPS Take 5 mg by mouth at bedtime as needed (sleep).   Multiple Vitamins-Minerals (PRESERVISION AREDS 2) CAPS Take 1 capsule by mouth daily.   ondansetron  (ZOFRAN ) 4 MG tablet Take 1 tablet (4 mg total) by mouth every 8 (eight) hours as needed for nausea.   pantoprazole  (PROTONIX ) 40 MG tablet TAKE 1 TABLET BY MOUTH EVERY DAY   PROLIA  60 MG/ML SOSY injection Inject into the skin.   solifenacin  (VESICARE ) 10 MG tablet TAKE 1 TABLET BY MOUTH EVERY DAY   traMADol  (ULTRAM ) 50 MG tablet Take 1 tablet (50 mg total) by mouth every 6 (six) hours as needed for  severe pain (pain score 7-10).   vitamin B-12 (CYANOCOBALAMIN ) 500 MCG tablet Take 500 mcg by mouth daily.    Studies Reviewed: SABRA   EKG Interpretation Date/Time:  Tuesday April 21 2024 14:31:01 EST Ventricular Rate:  79 PR Interval:  150 QRS Duration:  84 QT Interval:  370 QTC Calculation: 424 R Axis:   -28  Text Interpretation: Sinus rhythm with frequent Premature ventricular complexes Cannot rule out Inferior infarct , age undetermined T wave abnormality, consider lateral ischemia When compared with ECG of 31-Aug-2021 17:14, No significant change since last tracing Confirmed by Anner Lenis ([id number]) on 04/21/2024 3:02:37 PM    Results LABS Cholesterol: 185 (11/05/2023) HDL: 46 (11/05/2023) LDL: 886 (11/05/2023) Triglycerides: 133 (11/05/2023) Hemoglobin: 12.4 (11/05/2023) Creatinine: 0.81 (11/05/2023) Potassium: 4.0 (11/05/2023) TSH: 1.4 (11/05/2023)  RADIOLOGY Coronary Calcium Score: 164, aortic atherosclerosis noted (08/2021) CT Scan of Abdomen: Advanced aortic atherosclerosis, minimal plaque, no aortic narrowing (2023)  DIAGNOSTIC Echocardiogram: Left ventricle ejection fraction 60-65%, no wall motion abnormalities, mild abnormal relaxation, slightly elevated pulmonary arterial pressures, normal mitral and aortic valves, premature ventricular beats (08/2021)   Risk Assessment/Calculations:     HYPERTENSION CONTROL Vitals:   04/21/24 1435   BP: (!) 190/74 (!) 180/88    The patient's blood pressure is elevated above target today. {Click here to indicate intervention taken to address high BP   In order to address the patient's elevated BP: Will monitor for now and potentially adjust medications based on follow-up recordings.         Physical Exam:   VS:  BP (!) 180/88   Pulse 79   Ht 5' (1.524 m)   Wt 187 lb (84.8 kg)   SpO2 98%   BMI 36.52 kg/m    Wt Readings from Last 3 Encounters:  04/21/24 187 lb (84.8 kg)  01/17/24 187 lb (84.8 kg)  12/30/23 179  lb (81.2 kg)      GEN: Well nourished, well developed; anxious but in no acute distress; moderately obese NECK: No JVD; No carotid bruits CARDIAC: Normal S1, S2; RRR, no murmurs, rubs, gallops RESPIRATORY:  Clear to auscultation without rales, wheezing or rhonchi ; nonlabored, good air movement. ABDOMEN: Soft, non-tender, non-distended EXTREMITIES: Trivial ankle edema; No deformity      ASSESSMENT  AND PLAN: .    Problem List Items Addressed This Visit       Cardiology Problems   Agatston CAC score 100-199 - Primary (Chronic)   Coronary calcium score of 164 indicates moderate atherosclerosis. No angina or significant coronary artery disease. No invasive testing needed unless symptoms develop. - Continue aspirin  therapy, although not necessarily indicated at this level, but given advanced age not unreasonable. - She is on calcium channel blocker with preserved EF, not unreasonable, although may need additional blood pressure management which would probably be ARB or ACE inhibitor. - Most recent LDL was 113, would like for it to be closer to 70 => will convert from lovastatin  to rosuvastatin. - Monitor for symptoms such as chest pain, pressure, or shortness of breath.      Aortic atherosclerosis (Chronic)   Noted on previous imaging. No significant narrowing or aneurysmal dilation. Common in individuals over 50. - Monitor for symptoms such as claudication or pain in calves or thighs.      Essential hypertension (Chronic)   BP was well-controlled at PCPs office but very high here today.  I suspect this is due to her anxiety level.  I suspect that her will also improve when she rechecks at home.  We can reassess in follow-up.  For now we will simply continue her current dose of verapamil  240 mg daily.      Relevant Orders   EKG 12-Lead (Completed)   Frequent PVCs (Chronic)   PVC burden noted on telemetry in the hospital several years ago.  Denies any significant palpitation just some  occasional fluttering sensations.    Presence of PVCs on EKG. No significant symptoms. Previous monitoring indicated PVCs, no intervention needed unless frequency increases. - Consider 3-day Holter monitor to assess frequency of PVCs.      Hyperlipidemia with target LDL less than 70 (Chronic)   LDL cholesterol at 113 mg/dL. Lovastatin  insufficient for target LDL. Consider switching to rosuvastatin for effective reduction. - Discuss with primary care physician about switching to rosuvastatin 20 mg to achieve LDL target of less than 70 mg/dL.              Follow-Up: Return if symptoms worsen or fail to improve. Recording duration: 35 minutes I spent 57 minutes in the care of [name] today including reviewing outside labs from PCP via [facility] (1 minute), reviewing studies (her previous CAC score and echocardiogram reviewed-5 minutes), face to face time discussing treatment options (35 minutes-we discussed the pathophysiology of coronary artery disease with the meaning of coronary calcium score versus actual obstructive CAD, potential medication options.), reviewing records from previous cardiology notes (3 minutes), 13 minutes dictating, and documenting in the encounter.      Signed, Alm MICAEL Clay, MD, MS Alm Clay, [name], M.S. Interventional Cardiologist  [facility] Pager # [phone]

## 2024-04-23 ENCOUNTER — Ambulatory Visit: Admitting: Internal Medicine

## 2024-04-25 ENCOUNTER — Encounter: Payer: Self-pay | Admitting: Cardiology

## 2024-04-25 DIAGNOSIS — I700 Atherosclerosis of aorta: Secondary | ICD-10-CM | POA: Insufficient documentation

## 2024-04-25 NOTE — Assessment & Plan Note (Addendum)
 PVC burden noted on telemetry in the hospital several years ago.  Denies any significant palpitation just some occasional fluttering sensations.    Presence of PVCs on EKG. No significant symptoms. Previous monitoring indicated PVCs, no intervention needed unless frequency increases. - Consider 3-day Holter monitor to assess frequency of PVCs.

## 2024-04-25 NOTE — Assessment & Plan Note (Signed)
 LDL cholesterol at 113 mg/dL. Lovastatin  insufficient for target LDL. Consider switching to rosuvastatin for effective reduction. - Discuss with primary care physician about switching to rosuvastatin 20 mg to achieve LDL target of less than 70 mg/dL.

## 2024-04-25 NOTE — Assessment & Plan Note (Signed)
 Coronary calcium score of 164 indicates moderate atherosclerosis. No angina or significant coronary artery disease. No invasive testing needed unless symptoms develop. - Continue aspirin  therapy, although not necessarily indicated at this level, but given advanced age not unreasonable. - She is on calcium channel blocker with preserved EF, not unreasonable, although may need additional blood pressure management which would probably be ARB or ACE inhibitor. - Most recent LDL was 113, would like for it to be closer to 70 => will convert from lovastatin  to rosuvastatin. - Monitor for symptoms such as chest pain, pressure, or shortness of breath.

## 2024-04-25 NOTE — Assessment & Plan Note (Signed)
 Noted on previous imaging. No significant narrowing or aneurysmal dilation. Common in individuals over 50. - Monitor for symptoms such as claudication or pain in calves or thighs.

## 2024-04-25 NOTE — Assessment & Plan Note (Signed)
 BP was well-controlled at PCPs office but very high here today.  I suspect this is due to her anxiety level.  I suspect that her will also improve when she rechecks at home.  We can reassess in follow-up.  For now we will simply continue her current dose of verapamil  240 mg daily.

## 2024-04-28 ENCOUNTER — Encounter: Payer: Self-pay | Admitting: Gastroenterology

## 2024-04-28 ENCOUNTER — Ambulatory Visit: Admitting: Gastroenterology

## 2024-04-28 VITALS — BP 120/70 | HR 44 | Ht 60.0 in | Wt 191.0 lb

## 2024-04-28 DIAGNOSIS — Z1211 Encounter for screening for malignant neoplasm of colon: Secondary | ICD-10-CM | POA: Diagnosis not present

## 2024-04-28 DIAGNOSIS — I10 Essential (primary) hypertension: Secondary | ICD-10-CM

## 2024-04-28 NOTE — Progress Notes (Signed)
 Noted

## 2024-04-28 NOTE — Patient Instructions (Signed)
 Please follow up as needed.  _______________________________________________________  If your blood pressure at your visit was 140/90 or greater, please contact your primary care physician to follow up on this.  _______________________________________________________  If you are age 78 or older, your body mass index should be between 23-30. Your Body mass index is 37.3 kg/m. If this is out of the aforementioned range listed, please consider follow up with your Primary Care Provider.  If you are age 16 or younger, your body mass index should be between 19-25. Your Body mass index is 37.3 kg/m. If this is out of the aformentioned range listed, please consider follow up with your Primary Care Provider.   ________________________________________________________  The [facility] providers would like to encourage you to use MYCHART to communicate with providers for non-urgent requests or questions.  Due to long hold times on the telephone, sending your provider a message by [facility] may be a faster and more efficient way to get a response.  Please allow 48 business hours for a response.  Please remember that this is for non-urgent requests.  _______________________________________________________  [facility] is using a team-based approach to care.  Your team is made up of your doctor and two to three APPS. Our APPS (Nurse Practitioners and Physician Assistants) work with your physician to ensure care continuity for you. They are fully qualified to address your health concerns and develop a treatment plan. They communicate directly with your gastroenterologist to care for you. Seeing the Advanced Practice Practitioners on your physician's team can help you by facilitating care more promptly, often allowing for earlier appointments, access to diagnostic testing, procedures, and other specialty referrals.

## 2024-04-28 NOTE — Progress Notes (Signed)
 Chief Complaint: Colonoscopy recall Primary GI MD: Dr. Abran  HPI: Discussed the use of AI scribe software for clinical note transcription with the patient, who gave verbal consent to proceed.  [name] is a 78 year old female who presents for colonoscopy recall  Noted to have hypertension today with BP 188/88.  She has experienced elevated blood pressure for the past one to two weeks, which she attributes to stress related to her daughter's recent open heart surgery. During a recent visit to another physician, her blood pressure was significantly high, with readings of 188/88 mmHg and 190/74 mmHg. At home, her blood pressure readings have been lower, around 140/102 mmHg. No headaches, dizziness, or shortness of breath are reported. She is monitoring her blood pressure at home while also checking her daughter's blood pressure.  She recalls a previous colonoscopy where three very small precancerous polyps were removed in 2020. She expresses confusion about a recent insurance company visit suggesting she needed another colonoscopy.  Socially, she is actively involved in caring for her three great-grandchildren, aged two, three, and seven, who live with her. She describes them as 'sweet' and notes that they keep her active.   Past Medical History:  Diagnosis Date   Anemia    Anxiety    B12 DEFICIENCY    Blood transfusion without reported diagnosis    Cataract    Cholecystitis, unspecified    Chronic sinusitis    DEPRESSION    Diastolic dysfunction    DIVERTICULOSIS, COLON    DYSLIPIDEMIA    Dysrhythmia    PVC's   Fibromyalgia    GERD    GERD (gastroesophageal reflux disease)    Hyperplastic colon polyp    HYPERTENSION    INSOMNIA, PERSISTENT    Morbid obesity (HCC)    OSTEOARTHRITIS    Osteoporosis    PONV (postoperative nausea and vomiting)    Skin cancer 2018   upper lip    SYNCOPE    Ulcer of esophagus    VITAMIN D  DEFICIENCY     Past Surgical History:   Procedure Laterality Date   ABDOMINAL HYSTERECTOMY     APPENDECTOMY     BREAST BIOPSY Left 2010   Stero biopsy   BREAST EXCISIONAL BIOPSY Left 1985   Benign    CESAREAN SECTION     CHOLECYSTECTOMY     COLONOSCOPY     GASTROPLASTY     gatric stapling     LAPAROSCOPY N/A 01/30/2017   Procedure: LAPAROSCOPY DIAGNOSTIC WITH LAPAROSCOPIC LYSIS OF ADHESIONS;  Surgeon: Mikell Katz, MD;  Location: [facility];  Service: General;  Laterality: N/A;   MIDDLE EAR SURGERY Right    NASAL SINUS SURGERY Left 08/06/2018   Procedure: Endoscopic SPHENOIDECTOMY;  Surgeon: Jesus Oliphant, MD;  Location: [facility];  Service: ENT;  Laterality: Left;   revearsal of gastric stapling     TONSILLECTOMY     TOTAL KNEE ARTHROPLASTY     rt.   TOTAL KNEE REVISION Right 01/22/2022   Procedure: TOTAL KNEE REVISION;  Surgeon: Rubie Kemps, MD;  Location: [facility];  Service: Orthopedics;  Laterality: Right;   TUBAL LIGATION     TYMPANOPLASTY Right 10/22/2016   Procedure: RIGHT TYMPANOPLASTY;  Surgeon: Jesus Oliphant, MD;  Location: [facility];  Service: ENT;  Laterality: Right;   TYMPANOPLASTY Right 09/02/2017   Procedure: REVISION RIGHT TYMPANOPLASTY;  Surgeon: Jesus Oliphant, MD;  Location: [facility];  Service: ENT;  Laterality: Right;  Current Outpatient Medications  Medication Sig Dispense Refill   ALPRAZolam  (XANAX ) 0.5 MG tablet Take 1 tablet (0.5 mg total) by mouth 2 (two) times daily as needed. 60 tablet 3   buPROPion  (WELLBUTRIN  XL) 150 MG 24 hr tablet TAKE 1 TABLET BY MOUTH EVERY DAY 90 tablet 1   Cholecalciferol  (VITAMIN D ) 2000 UNITS CAPS Take 2,000 Units by mouth daily.     diclofenac  Sodium (VOLTAREN ) 1 % GEL Apply 1 Application topically 4 (four) times daily as needed (pain).     fluticasone  (FLONASE ) 50 MCG/ACT nasal spray Place 2 sprays into both nostrils daily. 48 g 3   gabapentin  (NEURONTIN ) 100 MG capsule Take 1 capsule (100 mg total) by mouth at bedtime.  90 capsule 3   ibuprofen  (ADVIL ) 200 MG tablet Take 200 mg by mouth every 6 (six) hours as needed for moderate pain.     linaclotide  (LINZESS ) 290 MCG CAPS capsule Take 1 capsule (290 mcg total) by mouth daily. 90 capsule 3   lovastatin  (MEVACOR ) 40 MG tablet Take 1 tablet (40 mg total) by mouth daily. 90 tablet 3   Melatonin 5 MG CAPS Take 5 mg by mouth at bedtime as needed (sleep).     Multiple Vitamins-Minerals (PRESERVISION AREDS 2) CAPS Take 1 capsule by mouth daily.     ondansetron  (ZOFRAN ) 4 MG tablet Take 1 tablet (4 mg total) by mouth every 8 (eight) hours as needed for nausea. 20 tablet 0   pantoprazole  (PROTONIX ) 40 MG tablet TAKE 1 TABLET BY MOUTH EVERY DAY 90 tablet 2   PROLIA  60 MG/ML SOSY injection Inject into the skin.     solifenacin  (VESICARE ) 10 MG tablet TAKE 1 TABLET BY MOUTH EVERY DAY 90 tablet 3   traMADol  (ULTRAM ) 50 MG tablet Take 1 tablet (50 mg total) by mouth every 6 (six) hours as needed for severe pain (pain score 7-10). 20 tablet 1   verapamil  (CALAN -SR) 240 MG CR tablet Take 1 tablet (240 mg total) by mouth at bedtime. 90 tablet 3   vitamin B-12 (CYANOCOBALAMIN ) 500 MCG tablet Take 500 mcg by mouth daily.     No current facility-administered medications for this visit.    Allergies as of 04/28/2024 - Review Complete 04/28/2024  Allergen Reaction Noted   Oxycodone  Other (See Comments) 01/26/2022   Levaquin  [levofloxacin ] Nausea Only 06/16/2013   Other Nausea And Vomiting 01/03/2022   Omnicef  [cefdinir ] Diarrhea and Nausea And Vomiting 08/31/2021    Family History  Problem Relation Age of Onset   Arthritis Mother    Hypertension Mother        pulmonary   Heart disease Mother    Emphysema Father    Heart disease Father    Diabetes Sister    Lung cancer Sister    Diabetes Sister    COPD Sister    Heart Problems Daughter    Diabetes Daughter    Colon polyps Daughter    Colon cancer Maternal Aunt    Diabetes Maternal Aunt        x 4   Colon cancer  Maternal Uncle    Diabetes Maternal Uncle        x 3   Breast cancer Paternal Aunt 39   Diabetes Paternal Uncle         1   Colon cancer Other        nephew   Stomach cancer Other    Kidney disease Other        niece  Esophageal cancer Neg Hx    Liver disease Neg Hx    Rectal cancer Neg Hx     Social History   Socioeconomic History   Marital status: Widowed    Spouse name: Not on file   Number of children: 2   Years of education: Not on file   Highest education level: Some college, no degree  Occupational History   Occupation: retired  Tobacco Use   Smoking status: Never   Smokeless tobacco: Never  Vaping Use   Vaping status: Never Used  Substance and Sexual Activity   Alcohol use: No   Drug use: No   Sexual activity: Not Currently    Birth control/protection: Post-menopausal, Surgical  Other Topics Concern   Not on file  Social History Narrative   Patient is right-handed. She lives with her husband in a one level home. She drinks 1-2 diet Cokes a day and rarely drinks coffee. She does not exercise.   Social Drivers of Corporate Investment Banker Strain: Low Risk  (12/30/2023)   Overall Financial Resource Strain (CARDIA)    Difficulty of Paying Living Expenses: Not hard at all  Food Insecurity: No Food Insecurity (12/30/2023)   Hunger Vital Sign    Worried About Running Out of Food in the Last Year: Never true    Ran Out of Food in the Last Year: Never true  Transportation Needs: No Transportation Needs (12/30/2023)   PRAPARE - Administrator, Civil Service (Medical): No    Lack of Transportation (Non-Medical): No  Physical Activity: Insufficiently Active (12/30/2023)   Exercise Vital Sign    Days of Exercise per Week: 4 days    Minutes of Exercise per Session: 30 min  Stress: No Stress Concern Present (12/30/2023)   [employer] of Occupational Health - Occupational Stress Questionnaire    Feeling of Stress: Not at all  Recent Concern: Stress  - Stress Concern Present (11/04/2023)   [employer] of Occupational Health - Occupational Stress Questionnaire    Feeling of Stress : To some extent  Social Connections: Moderately Integrated (12/30/2023)   Social Connection and Isolation Panel    Frequency of Communication with Friends and Family: More than three times a week    Frequency of Social Gatherings with Friends and Family: Once a week    Attends Religious Services: More than 4 times per year    Active Member of [employer] or Organizations: Yes    Attends Banker Meetings: More than 4 times per year    Marital Status: Widowed  Intimate Partner Violence: Not At Risk (12/30/2023)   Humiliation, Afraid, Rape, and Kick questionnaire    Fear of Current or Ex-Partner: No    Emotionally Abused: No    Physically Abused: No    Sexually Abused: No    Review of Systems:    Constitutional: No weight loss, fever, chills, weakness or fatigue HEENT: Eyes: No change in vision               Ears, Nose, Throat:  No change in hearing or congestion Skin: No rash or itching Cardiovascular: No chest pain, chest pressure or palpitations   Respiratory: No SOB or cough Gastrointestinal: See HPI and otherwise negative Genitourinary: No dysuria or change in urinary frequency Neurological: No headache, dizziness or syncope Musculoskeletal: No new muscle or joint pain Hematologic: No bleeding or bruising Psychiatric: No history of depression or anxiety    Physical Exam:  Vital signs: BP ROLLEN)  188/88   Pulse (!) 44   Ht 5' (1.524 m)   Wt 191 lb (86.6 kg)   BMI 37.30 kg/m   Constitutional: NAD, alert and cooperative.  Appears significantly younger than stated age Head:  Normocephalic and atraumatic. Eyes:   PEERL, EOMI. No icterus. Conjunctiva pink. Respiratory: Respirations even and unlabored. Lungs clear to auscultation bilaterally.   No wheezes, crackles, or rhonchi.  Cardiovascular:  Regular rate and rhythm. No peripheral  edema, cyanosis or pallor.  Gastrointestinal:  Soft, nondistended, nontender. No rebound or guarding. Normal bowel sounds. No appreciable masses or hepatomegaly. Rectal:  Declines Msk:  Symmetrical without gross deformities. Without edema, no deformity or joint abnormality.  Neurologic:  Alert and  oriented x4;  grossly normal neurologically.  Skin:   Dry and intact without significant lesions or rashes. Psychiatric: Oriented to person, place and time. Demonstrates good judgement and reason without abnormal affect or behaviors.   RELEVANT LABS AND IMAGING: CBC    Component Value Date/Time   WBC 7.6 11/05/2023 1630   RBC 4.29 11/05/2023 1630   HGB 12.4 11/05/2023 1630   HCT 38.0 11/05/2023 1630   PLT 237.0 11/05/2023 1630   MCV 88.5 11/05/2023 1630   MCH 29.5 01/08/2022 1409   MCHC 32.7 11/05/2023 1630   RDW 15.0 11/05/2023 1630   LYMPHSABS 2.8 11/05/2023 1630   MONOABS 0.7 11/05/2023 1630   EOSABS 0.2 11/05/2023 1630   BASOSABS 0.1 11/05/2023 1630    CMP     Component Value Date/Time   NA 142 01/21/2024 0948   K 4.0 01/21/2024 0948   CL 105 01/21/2024 0948   CO2 31 01/21/2024 0948   GLUCOSE 85 01/21/2024 0948   BUN 12 01/21/2024 0948   CREATININE 0.81 01/21/2024 0948   CALCIUM 9.0 01/21/2024 0948   PROT 6.5 01/21/2024 0948   ALBUMIN 4.1 01/21/2024 0948   AST 11 01/21/2024 0948   ALT 7 01/21/2024 0948   ALKPHOS 59 01/21/2024 0948   BILITOT 0.9 01/21/2024 0948   GFRNONAA >60 01/08/2022 1409   GFRAA >60 02/16/2017 1046     Assessment/Plan:   History of colon polyps Colonoscopy 2020 with 3 small adenomatous polyps and no repeat due to age.  Patient's insurance person came to her house and said she needed a colonoscopy, Dr. Abran did not recommend repeat colonoscopy due to age.  She is not having any GI issues at this time.  Certainly if GI issues were to develop we could pursue diagnostic colonoscopy but at this time risks outweigh benefits of surveillance  colonoscopy - Hold off on pursuing surveillance colonoscopy - If symptoms arise we can pursue diagnostic colonoscopy as needed  Hypertension Hypertension over the last 2 weeks with documented blood pressure today 188/88.  Patient denies headache dizziness and shortness of breath.  States at home her blood pressure is in the 140s.  Stress of her daughter undergoing open heart surgery with her being a caretaker. - Follow-up with PCP for hypertensive management - ED precautions provided  Nestor Mollie RIGGERS [facility] 04/28/2024, 3:06 PM  Cc: Plotnikov, Karlynn GAILS, MD

## 2024-05-21 ENCOUNTER — Ambulatory Visit: Admitting: Internal Medicine

## 2024-05-25 ENCOUNTER — Other Ambulatory Visit: Payer: Self-pay | Admitting: Medical Genetics

## 2024-05-26 ENCOUNTER — Encounter: Payer: Self-pay | Admitting: Internal Medicine

## 2024-05-26 ENCOUNTER — Ambulatory Visit: Admitting: Internal Medicine

## 2024-05-26 VITALS — BP 112/72 | HR 79 | Temp 97.9°F | Ht 60.0 in | Wt 187.8 lb

## 2024-05-26 DIAGNOSIS — E538 Deficiency of other specified B group vitamins: Secondary | ICD-10-CM

## 2024-05-26 DIAGNOSIS — M65331 Trigger finger, right middle finger: Secondary | ICD-10-CM

## 2024-05-26 DIAGNOSIS — M6530 Trigger finger, unspecified finger: Secondary | ICD-10-CM | POA: Insufficient documentation

## 2024-05-26 DIAGNOSIS — F09 Unspecified mental disorder due to known physiological condition: Secondary | ICD-10-CM

## 2024-05-26 DIAGNOSIS — E559 Vitamin D deficiency, unspecified: Secondary | ICD-10-CM

## 2024-05-26 DIAGNOSIS — K5901 Slow transit constipation: Secondary | ICD-10-CM

## 2024-05-26 NOTE — Progress Notes (Signed)
 Subjective:  Patient ID: [name], female    DOB: 1946/04/11  Age: 78 y.o. MRN: [id number]  CC: Medical Management of Chronic Issues (3 Month follow up)   HPI [name] presents for anxiety, constipation, dyslipidemia, OA  Outpatient Medications Prior to Visit  Medication Sig Dispense Refill   ALPRAZolam  (XANAX ) 0.5 MG tablet Take 1 tablet (0.5 mg total) by mouth 2 (two) times daily as needed. 60 tablet 3   buPROPion  (WELLBUTRIN  XL) 150 MG 24 hr tablet TAKE 1 TABLET BY MOUTH EVERY DAY 90 tablet 1   Cholecalciferol  (VITAMIN D ) 2000 UNITS CAPS Take 2,000 Units by mouth daily.     diclofenac  Sodium (VOLTAREN ) 1 % GEL Apply 1 Application topically 4 (four) times daily as needed (pain).     fluticasone  (FLONASE ) 50 MCG/ACT nasal spray Place 2 sprays into both nostrils daily. 48 g 3   gabapentin  (NEURONTIN ) 100 MG capsule Take 1 capsule (100 mg total) by mouth at bedtime. 90 capsule 3   ibuprofen  (ADVIL ) 200 MG tablet Take 200 mg by mouth every 6 (six) hours as needed for moderate pain.     linaclotide  (LINZESS ) 290 MCG CAPS capsule Take 1 capsule (290 mcg total) by mouth daily. 90 capsule 3   lovastatin  (MEVACOR ) 40 MG tablet Take 1 tablet (40 mg total) by mouth daily. 90 tablet 3   Melatonin 5 MG CAPS Take 5 mg by mouth at bedtime as needed (sleep).     Multiple Vitamins-Minerals (PRESERVISION AREDS 2) CAPS Take 1 capsule by mouth daily.     ondansetron  (ZOFRAN ) 4 MG tablet Take 1 tablet (4 mg total) by mouth every 8 (eight) hours as needed for nausea. 20 tablet 0   pantoprazole  (PROTONIX ) 40 MG tablet TAKE 1 TABLET BY MOUTH EVERY DAY 90 tablet 2   PROLIA  60 MG/ML SOSY injection Inject into the skin.     solifenacin  (VESICARE ) 10 MG tablet TAKE 1 TABLET BY MOUTH EVERY DAY 90 tablet 3   traMADol  (ULTRAM ) 50 MG tablet Take 1 tablet (50 mg total) by mouth every 6 (six) hours as needed for severe pain (pain score 7-10). 20 tablet 1   verapamil  (CALAN -SR) 240 MG CR tablet Take 1 tablet  (240 mg total) by mouth at bedtime. 90 tablet 3   vitamin B-12 (CYANOCOBALAMIN ) 500 MCG tablet Take 500 mcg by mouth daily.     No facility-administered medications prior to visit.    ROS: Review of Systems  Constitutional:  Negative for activity change, appetite change, chills, fatigue and unexpected weight change.  HENT:  Negative for congestion, mouth sores and sinus pressure.   Eyes:  Negative for visual disturbance.  Respiratory:  Negative for cough and chest tightness.   Gastrointestinal:  Negative for abdominal pain and nausea.  Genitourinary:  Negative for difficulty urinating, frequency and vaginal pain.  Musculoskeletal:  Positive for arthralgias. Negative for back pain and gait problem.  Skin:  Negative for pallor and rash.  Neurological:  Negative for dizziness, tremors, weakness, numbness and headaches.  Psychiatric/Behavioral:  Negative for confusion, sleep disturbance and suicidal ideas. The patient is not nervous/anxious.     Objective:  BP 112/72   Pulse 79   Temp 97.9 F (36.6 C)   Ht 5' (1.524 m)   Wt 187 lb 12.8 oz (85.2 kg)   SpO2 96%   BMI 36.68 kg/m   BP Readings from Last 3 Encounters:  05/26/24 112/72  04/28/24 120/70  04/25/24 (!) 180/88  Wt Readings from Last 3 Encounters:  05/26/24 187 lb 12.8 oz (85.2 kg)  04/28/24 191 lb (86.6 kg)  04/21/24 187 lb (84.8 kg)    Physical Exam Constitutional:      General: She is not in acute distress.    Appearance: She is well-developed.  HENT:     Head: Normocephalic.     Right Ear: External ear normal.     Left Ear: External ear normal.     Nose: Nose normal.  Eyes:     General:        Right eye: No discharge.        Left eye: No discharge.     Conjunctiva/sclera: Conjunctivae normal.     Pupils: Pupils are equal, round, and reactive to light.  Neck:     Thyroid : No thyromegaly.     Vascular: No JVD.     Trachea: No tracheal deviation.  Cardiovascular:     Rate and Rhythm: Normal rate and  regular rhythm.     Heart sounds: Normal heart sounds.  Pulmonary:     Effort: No respiratory distress.     Breath sounds: No stridor. No wheezing.  Abdominal:     General: Bowel sounds are normal. There is no distension.     Palpations: Abdomen is soft. There is no mass.     Tenderness: There is no abdominal tenderness. There is no guarding or rebound.  Musculoskeletal:        General: Tenderness present.     Cervical back: Normal range of motion and neck supple. No rigidity.     Right lower leg: No edema.     Left lower leg: No edema.  Lymphadenopathy:     Cervical: No cervical adenopathy.  Skin:    Findings: No erythema or rash.  Neurological:     Cranial Nerves: No cranial nerve deficit.     Motor: No abnormal muscle tone.     Coordination: Coordination normal.     Deep Tendon Reflexes: Reflexes normal.  Psychiatric:        Behavior: Behavior normal.        Thought Content: Thought content normal.        Judgment: Judgment normal.   Trigger middle finger R  Lab Results  Component Value Date   WBC 7.6 11/05/2023   HGB 12.4 11/05/2023   HCT 38.0 11/05/2023   PLT 237.0 11/05/2023   GLUCOSE 85 01/21/2024   CHOL 185 11/05/2023   TRIG 133.0 11/05/2023   HDL 46.10 11/05/2023   LDLDIRECT 111.0 02/04/2019   LDLCALC 113 (H) 11/05/2023   ALT 7 01/21/2024   AST 11 01/21/2024   NA 142 01/21/2024   K 4.0 01/21/2024   CL 105 01/21/2024   CREATININE 0.81 01/21/2024   BUN 12 01/21/2024   CO2 31 01/21/2024   TSH 1.41 11/05/2023   INR 1.1 08/31/2021   HGBA1C 5.6 08/31/2021    MM 3D SCREENING MAMMOGRAM BILATERAL BREAST Result Date: 08/28/2023 CLINICAL DATA:  Screening. EXAM: DIGITAL SCREENING BILATERAL MAMMOGRAM WITH TOMOSYNTHESIS AND CAD TECHNIQUE: Bilateral screening digital craniocaudal and mediolateral oblique mammograms were obtained. Bilateral screening digital breast tomosynthesis was performed. The images were evaluated with computer-aided detection. COMPARISON:   Previous exam(s). ACR Breast Density Category b: There are scattered areas of fibroglandular density. FINDINGS: There are no findings suspicious for malignancy. IMPRESSION: No mammographic evidence of malignancy. A result letter of this screening mammogram will be mailed directly to the patient. RECOMMENDATION: Screening mammogram in one year. (  Code:SM-B-01Y) BI-RADS CATEGORY  1: Negative. Electronically Signed   By: Alm Parkins [name]   On: 08/28/2023 10:38    Assessment & Plan:   Problem List Items Addressed This Visit     Mild cognitive disorder   Shingrix advised      Trigger finger - Primary   Vitamin D  deficiency   On Vit D         No orders of the defined types were placed in this encounter.     Follow-up: No follow-ups on file.  Marolyn Noel, MD

## 2024-05-26 NOTE — Assessment & Plan Note (Signed)
 OK generic Linzess  if available

## 2024-05-26 NOTE — Assessment & Plan Note (Signed)
 On B12

## 2024-05-26 NOTE — Assessment & Plan Note (Signed)
Shingrix advised.

## 2024-05-26 NOTE — Assessment & Plan Note (Signed)
 On Vit D

## 2024-05-26 NOTE — Patient Instructions (Signed)

## 2024-07-09 ENCOUNTER — Other Ambulatory Visit

## 2024-07-09 ENCOUNTER — Other Ambulatory Visit: Payer: Self-pay | Admitting: Gynecology

## 2024-07-09 DIAGNOSIS — Z006 Encounter for examination for normal comparison and control in clinical research program: Secondary | ICD-10-CM

## 2024-07-09 DIAGNOSIS — Z1231 Encounter for screening mammogram for malignant neoplasm of breast: Secondary | ICD-10-CM

## 2024-07-15 ENCOUNTER — Other Ambulatory Visit: Payer: Self-pay | Admitting: Internal Medicine

## 2024-07-17 LAB — GENECONNECT MOLECULAR SCREEN: Genetic Analysis Overall Interpretation: NEGATIVE

## 2024-08-25 ENCOUNTER — Ambulatory Visit

## 2024-11-24 ENCOUNTER — Ambulatory Visit: Admitting: Internal Medicine

## 2024-12-31 ENCOUNTER — Ambulatory Visit
